# Patient Record
Sex: Male | Born: 1935 | Race: Black or African American | Hispanic: No | Smoking: Former smoker
Health system: Southern US, Community
[De-identification: ages and names within clinical notes are randomized; demographics above are authoritative.]

## PROBLEM LIST (undated history)

## (undated) DIAGNOSIS — Z8546 Personal history of malignant neoplasm of prostate: Secondary | ICD-10-CM

## (undated) DIAGNOSIS — N1330 Unspecified hydronephrosis: Secondary | ICD-10-CM

## (undated) DIAGNOSIS — A419 Sepsis, unspecified organism: Secondary | ICD-10-CM

## (undated) DIAGNOSIS — I10 Essential (primary) hypertension: Secondary | ICD-10-CM

## (undated) DIAGNOSIS — N210 Calculus in bladder: Secondary | ICD-10-CM

## (undated) DIAGNOSIS — R319 Hematuria, unspecified: Secondary | ICD-10-CM

## (undated) DIAGNOSIS — I82412 Acute embolism and thrombosis of left femoral vein: Secondary | ICD-10-CM

## (undated) DIAGNOSIS — Z9989 Dependence on other enabling machines and devices: Secondary | ICD-10-CM

## (undated) DIAGNOSIS — R0609 Other forms of dyspnea: Secondary | ICD-10-CM

## (undated) DIAGNOSIS — I4891 Unspecified atrial fibrillation: Secondary | ICD-10-CM

## (undated) DIAGNOSIS — Q181 Preauricular sinus and cyst: Secondary | ICD-10-CM

## (undated) DIAGNOSIS — J4489 Other specified chronic obstructive pulmonary disease: Secondary | ICD-10-CM

## (undated) DIAGNOSIS — K219 Gastro-esophageal reflux disease without esophagitis: Secondary | ICD-10-CM

## (undated) DIAGNOSIS — I82409 Acute embolism and thrombosis of unspecified deep veins of unspecified lower extremity: Secondary | ICD-10-CM

## (undated) DIAGNOSIS — Z973 Presence of spectacles and contact lenses: Secondary | ICD-10-CM

## (undated) DIAGNOSIS — R0600 Dyspnea, unspecified: Secondary | ICD-10-CM

## (undated) DIAGNOSIS — J449 Chronic obstructive pulmonary disease, unspecified: Secondary | ICD-10-CM

## (undated) DIAGNOSIS — M1990 Unspecified osteoarthritis, unspecified site: Secondary | ICD-10-CM

## (undated) DIAGNOSIS — G4733 Obstructive sleep apnea (adult) (pediatric): Secondary | ICD-10-CM

## (undated) HISTORY — PX: GANGLION CYST EXCISION: SHX1691

## (undated) HISTORY — DX: Acute embolism and thrombosis of left femoral vein: I82.412

## (undated) HISTORY — DX: Essential (primary) hypertension: I10

## (undated) HISTORY — DX: Other specified chronic obstructive pulmonary disease: J44.89

## (undated) HISTORY — DX: Chronic obstructive pulmonary disease, unspecified: J44.9

---
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
## 1997-10-25 ENCOUNTER — Ambulatory Visit (HOSPITAL_BASED_OUTPATIENT_CLINIC_OR_DEPARTMENT_OTHER): Admission: RE | Admit: 1997-10-25 | Discharge: 1997-10-25 | Payer: Self-pay | Admitting: Orthopedic Surgery

## 1997-11-08 ENCOUNTER — Encounter: Payer: Self-pay | Admitting: Cardiovascular Disease

## 1997-11-08 ENCOUNTER — Ambulatory Visit (HOSPITAL_COMMUNITY): Admission: RE | Admit: 1997-11-08 | Discharge: 1997-11-08 | Payer: Self-pay | Admitting: Cardiovascular Disease

## 1997-11-12 ENCOUNTER — Ambulatory Visit: Admission: RE | Admit: 1997-11-12 | Discharge: 1997-11-12 | Payer: Self-pay | Admitting: Internal Medicine

## 1997-11-12 ENCOUNTER — Encounter: Payer: Self-pay | Admitting: Internal Medicine

## 1998-10-06 ENCOUNTER — Encounter: Payer: Self-pay | Admitting: Internal Medicine

## 1998-10-06 ENCOUNTER — Ambulatory Visit (HOSPITAL_COMMUNITY): Admission: RE | Admit: 1998-10-06 | Discharge: 1998-10-06 | Payer: Self-pay | Admitting: Internal Medicine

## 1999-05-22 ENCOUNTER — Encounter: Admission: RE | Admit: 1999-05-22 | Discharge: 1999-05-22 | Payer: Self-pay | Admitting: Internal Medicine

## 1999-05-22 ENCOUNTER — Encounter: Payer: Self-pay | Admitting: Internal Medicine

## 2000-12-23 ENCOUNTER — Ambulatory Visit (HOSPITAL_COMMUNITY): Admission: RE | Admit: 2000-12-23 | Discharge: 2000-12-23 | Payer: Self-pay | Admitting: Internal Medicine

## 2000-12-23 ENCOUNTER — Encounter: Payer: Self-pay | Admitting: Internal Medicine

## 2001-04-15 ENCOUNTER — Ambulatory Visit (HOSPITAL_BASED_OUTPATIENT_CLINIC_OR_DEPARTMENT_OTHER): Admission: RE | Admit: 2001-04-15 | Discharge: 2001-04-15 | Payer: Self-pay | Admitting: Internal Medicine

## 2002-05-05 ENCOUNTER — Ambulatory Visit (HOSPITAL_COMMUNITY): Admission: RE | Admit: 2002-05-05 | Discharge: 2002-05-05 | Payer: Self-pay | Admitting: Gastroenterology

## 2003-06-14 ENCOUNTER — Encounter: Admission: RE | Admit: 2003-06-14 | Discharge: 2003-06-14 | Payer: Self-pay | Admitting: Internal Medicine

## 2005-03-12 ENCOUNTER — Ambulatory Visit: Payer: Self-pay | Admitting: Internal Medicine

## 2005-05-23 ENCOUNTER — Encounter: Admission: RE | Admit: 2005-05-23 | Discharge: 2005-05-23 | Payer: Self-pay | Admitting: Orthopedic Surgery

## 2005-05-28 ENCOUNTER — Ambulatory Visit (HOSPITAL_BASED_OUTPATIENT_CLINIC_OR_DEPARTMENT_OTHER): Admission: RE | Admit: 2005-05-28 | Discharge: 2005-05-28 | Payer: Self-pay | Admitting: Orthopedic Surgery

## 2005-05-28 HISTORY — PX: CARPAL TUNNEL RELEASE: SHX101

## 2006-01-13 ENCOUNTER — Emergency Department (HOSPITAL_COMMUNITY): Admission: EM | Admit: 2006-01-13 | Discharge: 2006-01-14 | Payer: Self-pay | Admitting: *Deleted

## 2006-03-11 ENCOUNTER — Ambulatory Visit: Payer: Self-pay | Admitting: Internal Medicine

## 2007-01-06 DIAGNOSIS — E669 Obesity, unspecified: Secondary | ICD-10-CM | POA: Insufficient documentation

## 2007-01-06 DIAGNOSIS — G4733 Obstructive sleep apnea (adult) (pediatric): Secondary | ICD-10-CM | POA: Insufficient documentation

## 2007-01-06 DIAGNOSIS — J449 Chronic obstructive pulmonary disease, unspecified: Secondary | ICD-10-CM

## 2007-01-06 DIAGNOSIS — E119 Type 2 diabetes mellitus without complications: Secondary | ICD-10-CM

## 2007-01-06 DIAGNOSIS — I10 Essential (primary) hypertension: Secondary | ICD-10-CM | POA: Insufficient documentation

## 2007-03-11 ENCOUNTER — Ambulatory Visit: Payer: Self-pay | Admitting: Internal Medicine

## 2008-03-10 ENCOUNTER — Ambulatory Visit: Payer: Self-pay | Admitting: Internal Medicine

## 2008-05-10 ENCOUNTER — Encounter: Payer: Self-pay | Admitting: Internal Medicine

## 2008-12-20 ENCOUNTER — Ambulatory Visit: Admission: RE | Admit: 2008-12-20 | Discharge: 2009-03-20 | Payer: Self-pay | Admitting: Radiation Oncology

## 2009-03-13 ENCOUNTER — Ambulatory Visit: Payer: Self-pay | Admitting: Internal Medicine

## 2009-03-20 ENCOUNTER — Ambulatory Visit: Admission: RE | Admit: 2009-03-20 | Discharge: 2009-03-24 | Payer: Self-pay | Admitting: Radiation Oncology

## 2009-03-27 ENCOUNTER — Ambulatory Visit: Admission: RE | Admit: 2009-03-27 | Discharge: 2009-05-09 | Payer: Self-pay | Admitting: Radiation Oncology

## 2010-03-13 ENCOUNTER — Ambulatory Visit: Payer: Self-pay | Admitting: Internal Medicine

## 2010-04-10 ENCOUNTER — Ambulatory Visit (HOSPITAL_COMMUNITY)
Admission: RE | Admit: 2010-04-10 | Discharge: 2010-04-10 | Payer: Self-pay | Source: Home / Self Care | Attending: Gastroenterology | Admitting: Gastroenterology

## 2010-04-11 LAB — GLUCOSE, CAPILLARY: Glucose-Capillary: 77 mg/dL (ref 70–99)

## 2010-04-26 NOTE — Assessment & Plan Note (Signed)
Summary: 1 YEAR/ MBW   Primary Roy [name]/Referring Roy [name]:  Kevan Ny  CC:  Yearly follow up visit-OSA and asthma.  History of Present Illness: 03/11/07- F/U Sleep Apnea; Satisfied with sleep now. Wears BIPap 7/4 [employer], nasal mask no humidifier. Feels rested in day without EDS, rare nap.  03/10/08- OSA Reviewed use of BiPap. Pressure unchanged. Occasional stuffy nose at night. Denies daytime sleepiness or reports of breakthrough snoring. His asthma is well controlled and he rarely needs rescue inhaler. Doesn't know his diabetes pills. Nocturia affected by timing of his diuretic- frequent.Denies heart trouble. Has had both flu shots.  March 13, 2009- OSA, Asthma/COPD He uses BiPAP 7/4 every night. He only snores if mask slips. Denies daytime somnolence. Uses Advair only in stretches if needed and hasn't needed his rescue inhaler in a long time. Had flu vax.  March 13, 2010- OSA, Asthma/COPD........Roy [name] here Has concerns that CPAP masks are not lasting. They work at first, then start to leak. Thedy would like to discuss changing DME companies  BIPAP I 7/ E 4. Uses all night every night.  No major chest problem or dyspnea this winter.      Preventive Screening-Counseling & Management  Alcohol-Tobacco     Smoking Status: quit     Packs/Day: 0.5     Year Quit: 2001  Current Medications (verified): 1)  Advair Diskus 250-50 Mcg/dose  Misc (Fluticasone-Salmeterol) .... One Inhhalation Twice Daily  ~ Rinse Mouth Well After Use 2)  Proair Hfa 108 (90 Base) Mcg/act Aers (Albuterol Sulfate) .... 2 Puffs Four Times A Day As Needed Rescue 3)  Bipap Inspiratory 7, Expiratory 4 .... [employer]  Allergies (verified): No Known Drug Allergies  Past History:  Past Medical History: Last updated: 03/10/2008 ASTHMA, CHRONIC OBSTRUCTIVE WITHOUT STATUS ASTHMATICUS (ICD-493.20) DM (ICD-250.00) HYPERTENSION (ICD-401.9) OBSTRUCTIVE SLEEP APNEA (ICD-327.23) EXOGENOUS OBESITY  (ICD-278.00)  Past Surgical History: Last updated: 03/10/2008 Inguinal hernia repair ganglion cyst  Family History: Last updated: 03/10/2008 father-diabetes  Social History: Last updated: 03/10/2008 Patient states former smoker.  Quit smoking 10 years ago.  Smoked x 25 years 1pp2days. Pt is married, with 5 children. Pt is retired, formerly a Production assistant, radio.  Risk Factors: Smoking Status: quit (03/13/2010) Packs/Day: 0.5 (03/13/2010)  Social History: Packs/Day:  0.5  Review of Systems      See HPI  The patient denies shortness of breath with activity, shortness of breath at rest, productive cough, non-productive cough, coughing up blood, chest pain, irregular heartbeats, acid heartburn, indigestion, loss of appetite, weight change, abdominal pain, difficulty swallowing, sore throat, tooth/dental problems, headaches, nasal congestion/difficulty breathing through nose, and sneezing.    Vital Signs:  Patient profile:   75 year old male Height:      70.5 inches Weight:      249.25 pounds BMI:     35.39 O2 Sat:      96 % on Room air Pulse rate:   68 / minute BP sitting:   126 / 82  (left arm) Cuff size:   large  Vitals Entered By: Clayborne Dana CMA (March 13, 2010 2:48 PM)  O2 Flow:  Room air CC: Yearly follow up visit-OSA and asthma   Physical Exam  Additional Exam:  General: A/Ox3; pleasant and cooperative, NAD, overweight NODES: no lymphadenopathy HEENT: [place]/AT, EOM- WNL, Conjuctivae- clear, PERRLA, TM-WNL, Nose- clear, Throat- clear and wnl, tonsils present, Mallampati IV overweight NECK: Supple w/ fair ROM, JVD- none, normal carotid impulses w/o bruits Thyroid-  CHEST: Clear to P&A HEART: RRR, no m/g/r  heard ABDOMEN: Soft and nl;  NEURO: Grossly intact to observation, very alert      Impression & Recommendations:  Problem # 1:  ASTHMA, CHRONIC OBSTRUCTIVE WITHOUT STATUS ASTHMATICUS (ICD-493.20) He is feeling pretty stable, w/o recent acute episodes and w/o  routine daily cough or wheeze.   Problem # 2:  OBSTRUCTIVE SLEEP APNEA (ICD-327.23)  He and his wife want to discuss DME company and alternative CPAP masks.   Medications Added to Medication List This Visit: 1)  Cpap Mask of Choice and Supplies   Other Orders: Est. Patient Level III [id number]) DME Referral (DME)  Patient Instructions: 1)  Please schedule a follow-up appointment in 1 year. 2)  See [facility] to discuss your CPAP company Prescriptions: CPAP MASK OF CHOICE AND SUPPLIES   #1 x prn   Entered and Authorized by:   Deneise Lever MD   Signed by:   Deneise Lever MD on 03/13/2010   Method used:   Print then Give to Patient   RxID:   [id number]

## 2010-05-31 NOTE — Op Note (Signed)
  Roy [name], Roy [name]             ACCOUNT NO.:  [id number]  MEDICAL RECORD NO.:  [id number]          PATIENT TYPE:  AMB  LOCATION:  ENDO                         FACILITY:  [facility]  PHYSICIAN:  Hayleigh Bawa C. Amedeo Plenty, M.D.    DATE OF BIRTH:  02-Jun-1935  DATE OF PROCEDURE:  04/10/2010 DATE OF DISCHARGE:                              OPERATIVE REPORT   PROCEDURE PERFORMED:  Colonoscopy with laser therapy.  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  DESCRIPTION OF PROCEDURE:  The patient was placed in the left lateral decubitus position and placed on the pulse monitor with continuous low- flow oxygen delivered by nasal cannula.  He was sedated with 75 mcg IV fentanyl and 5 mg IV Versed.  The Olympus video colonoscope was inserted into the rectum and advanced to the cecum, confirmed by transillumination of McBurney's point and visualization of the ileocecal valve and appendiceal orifice.  The prep was excellent.  The cecum, ascending, transverse, descending and sigmoid colon all appeared normal with no masses, polyps, diverticula or other mucosal abnormalities. Within the distal rectum approximately the last 5-8 cm, there were seen numerous flat telangiectatic lesions consistent with radiation proctitis with no contact hemorrhage.  Retroflexed view of the anus revealed these to be distributed in a roughly circumferential fashion.  The argon plasma coagulator was used to obliterate as many of the telangiectasia as could be accomplished with good results.  There was no significant bleeding resulting from the laser application.  No other lesions were seen in the rectum.  The scope was then withdrawn and the patient returned to the recovery room in stable condition.  He tolerated the procedure well.  There were no immediate complications.  IMPRESSION:  Successful laser fulguration of radiation proctitis.  PLAN:  We will observe response to today's  treatment and perform repeat laser therapy p.r.n. in the event of recurrent bleeding or significant recurrent bleeding.          ______________________________ Elyse Jarvis. Amedeo Plenty, M.D.     JCH/MEDQ  D:  04/10/2010  T:  04/10/2010  Job:  [id number]  cc:   Randall Hiss L. Marlou Sa, M.D. [address] [place] [id number]  Electronically Signed by Teena Irani M.D. on 05/29/2010 07:32:29 PM

## 2010-08-10 NOTE — Assessment & Plan Note (Signed)
[facility]                             PULMONARY OFFICE NOTE   NAME:Roy [name], Roy [name]                    MRN:          [id number]  DATE:03/11/2006                            DOB:          [birth year]    PROBLEMS:  1. Obstructive sleep apnea.  2. Exogenous obesity.  3. Hypertension.  4. Diabetes.  5. Asthma/chronic obstructive disease.   HISTORY:  One year followup. He has continued using a BiPAP machine with  an inspiratory pressure of 7, and expiratory pressure of 4, and says he  is very comfortable with this. His breathing over the past year has done  well. He does use Advair 250/50 twice daily, and with this rarely needs  albuterol. He has managed to lose some weight by dieting, and has had no  acute problems.   MEDICATIONS:  1. BiPAP inspiratory 7, expiratory 4, through [facility].  2. Advair 250/50 2 puffs b.i.d.  3. Rescue albuterol inhaler.  No medication allergy.   OBJECTIVE:  Weight 252 pounds, which is down 20 pounds from a year ago.  Blood pressure 160/88, pulse regular 72, room air saturation 98%.  Smiling, alert, appropriate. Chest is quiet and clear. Breathing is  unlabored. Voice quality is normal. There is no strider. Heart sounds  are regular without murmur.   IMPRESSION:  Stable obstructive sleep apnea, stable allergic rhinitis  with no acute respiratory issues. Asthma is well controlled.   PLAN:  Schedule return 1 year. He declines flu vaccine. Rescheduling  pulmonary function test.     Clinton D. Annamaria Boots, MD, [place], [place]  Electronically Signed    CDY/MedQ  DD: 03/12/2006  DT: 03/13/2006  Job #: [id number]   cc:   Randall Hiss L. Marlou Sa, M.D.

## 2010-08-10 NOTE — Op Note (Signed)
Roy [name], Roy [name]             ACCOUNT NO.:  [id number]   MEDICAL RECORD NO.:  [id number]          PATIENT TYPE:  AMB   LOCATION:  [place]                          FACILITY:  [facility]   PHYSICIAN:  Youlanda Mighty. Sypher, M.D. DATE OF BIRTH:  1935-07-15   DATE OF PROCEDURE:  05/28/2005  DATE OF DISCHARGE:                                 OPERATIVE REPORT   PREOPERATIVE DIAGNOSIS:  Left carpal tunnel syndrome.   POSTOPERATIVE DIAGNOSIS:  Left carpal tunnel syndrome.   OPERATION:  Release of left transverse carpal ligament.   OPERATIONS:  Youlanda Mighty. Sypher, M.D.   ASSISTANT:  Leverne Humbles, PA-C.   ANESTHESIA:  Proximal forearm IV regional block.   SUPERVISING ANESTHESIOLOGIST:  Sherren Kerns, M.D.   INDICATIONS:  Roy [name] is a 75 year old gentleman with a history of  prior bilateral carpal tunnel syndrome.  He is status post release of his  right transverse carpal ligament 6 years prior with good results.  He now  returns for followup evaluation of his left hand.  Clinical examination  revealed signs of significant entrapment neuropathy.  Electrodiagnostic  studies completed by Dr. Zebedee Iba confirmed left carpal tunnel syndrome.   Due to a failure to respond to nonoperative measures, she is brought to the  operating room at this time for release of his left transverse carpal  ligament.   Preoperatively, he was seen in consultation by Dr. Tamala Julian, who provided  anesthesia consultation.  He was noted to have an elevated cutaneous blood  glucose and background medical problems of hypertension, reflux and  obstructive airways disease.   Preoperatively, questions were invited and answered.   PROCEDURE:  Roy [name] is brought to the operating room and placed in  supine position upon the operating table.   Following the placement of a proximal forearm level IV regional block under  the direct supervision of Dr. Tamala Julian, the left arm was prepped with Betadine  soap and solution and  sterilely draped.  After 9 minutes, anesthesia was  satisfactory in the left arm.   The procedure commenced with a short incision in the line of the ring finger  and the palm.  Subcutaneous tissues were carefully divided, revealing the  palmar fascia.  This was split longitudinally to reveal the common extensor  branch of the median nerve.  These were followed back to the transverse  carpal ligament, which was carefully isolated from the median nerve.  The  ligament was released with scissors along the ulnar border of the transverse  carpal ligament, extending into the distal forearm.  This widely opened the  carpal canal.  No mass or other predicaments were noted.  Bleeding points  along the margin of the released ligament were cauterized with bipolar  current followed by repair the skin with intradermal 3-0 Prolene suture.   A compressive dressings was applied with a volar plaster splint maintaining  the wrist in 5 degrees of dorsiflexion.      Youlanda Mighty Sypher, M.D.  Electronically Signed     RVS/MEDQ  D:  05/28/2005  T:  05/29/2005  Job:  [id number]   cc:  Eric L. Marlou Sa, M.D.  Fax: [phone]

## 2010-08-10 NOTE — Op Note (Signed)
   NAME:  Roy [name], Roy [name]                       ACCOUNT NO.:  [id number]   MEDICAL RECORD NO.:  [id number]                   PATIENT TYPE:  AMB   LOCATION:  ENDO                                 FACILITY:  [facility]   PHYSICIAN:  John C. Amedeo Plenty, M.D.                 DATE OF BIRTH:  Oct 26, 1935   DATE OF PROCEDURE:  DATE OF DISCHARGE:                                 OPERATIVE REPORT   INDICATIONS FOR PROCEDURE:  Rectal bleeding in this 75 year old patient with  no prior colon screening.   DESCRIPTION OF PROCEDURE:  The patient was placed in the left lateral  decubitus position and placed on the pulse monitor with continuous low flow  oxygen delivered by nasal cannula.  He was sedated with 100 mcg of IV  Fentanyl ad 10 mg of IV Versed.  Video colonoscope was inserted into the  rectum and advanced to the cecum, confirmed by transillumination at  McBurney's point and visualization of the ileocecal valve and appendiceal  orifice.  The prep was good.  The cecum, ascending and transverse colon  appeared normal with no masses, polyps, diverticula or other mucosal  abnormalities.  Within the descending and sigmoid, there were a few  scattered diverticula with no  other abnormalities.  The rectum appeared  normal and the retroflex view of the anus revealed minimal internal  hemorrhoids.  The colonoscope was then withdrawn and the patient returned to  the recovery room in stable condition.  He tolerated the procedure well and  there were no immediate complications.   IMPRESSION:  Few scattered diverticula and small internal hemorrhoids,  otherwise normal study.   PLAN:  1. Treat hemorrhoids symptomatically.  2. Next colon screening by sigmoidoscopy in five years.                                               John C. Amedeo Plenty, M.D.    JCH/MEDQ  D:  05/05/2002  T:  05/05/2002  Job:  [id number]

## 2010-10-09 ENCOUNTER — Encounter: Payer: Self-pay | Admitting: Internal Medicine

## 2010-10-09 ENCOUNTER — Ambulatory Visit (INDEPENDENT_AMBULATORY_CARE_PROVIDER_SITE_OTHER): Payer: Medicare Other | Admitting: Internal Medicine

## 2010-10-09 VITALS — BP 150/78 | HR 60 | Ht 70.5 in | Wt 246.6 lb

## 2010-10-09 DIAGNOSIS — G4733 Obstructive sleep apnea (adult) (pediatric): Secondary | ICD-10-CM

## 2010-10-09 DIAGNOSIS — J449 Chronic obstructive pulmonary disease, unspecified: Secondary | ICD-10-CM

## 2010-10-09 NOTE — Assessment & Plan Note (Signed)
Feels his breathing is ok, with no asthma. Aware of stable dyspnea if he hurries. He hasn't needed Advair or rescue inhaler in a long time.

## 2010-10-09 NOTE — Assessment & Plan Note (Signed)
He is switching to [facility] and will continue BiPAP through them. He needs script now for CPAP supplies, and for replacement machine when due in September.

## 2010-10-09 NOTE — Progress Notes (Signed)
  Subjective:    Patient ID: Roy [name], male    DOB: [birth year], 75 y.o.   MRN: [id number]  HPI 10/09/10- 30 yoM followed for OSA, Asthma/ COPD    Wife here Last here March 13, 2010- note reviewed. He and his wife were unhappy with [facility] [employer] and are switching to [facility]. He has done well with BIPAP 17/4, with good control and compliance. He has not been able to lose weight.   Review of Systems Constitutional:   No-   weight loss, night sweats, fevers, chills, fatigue, lassitude. HEENT:   No-   headaches, difficulty swallowing, tooth/dental problems, sore throat,                  No-   sneezing, itching, ear ache, nasal congestion, post nasal drip,   CV:  No-   chest pain, orthopnea, PND, swelling in lower extremities, anasarca, dizziness, palpitations  GI:  No-   heartburn, indigestion, abdominal pain, nausea, vomiting, diarrhea,                 change in bowel habits, loss of appetite  Resp: No-   shortness of breath with exertion or at rest.  No-  excess mucus,             No-   productive cough,  No non-productive cough,  No-  coughing up of blood.              No-   change in color of mucus.  No- wheezing.    Skin: No-   rash or lesions.  GU: No-   dysuria, change in color of urine, no urgency or frequency.  No- flank pain.  MS:  No-   joint pain or swelling.  No- decreased range of motion.  No- back pain.  Psych:  No- change in mood or affect. No depression or anxiety.  No memory loss.      Objective:   Physical Exam General- Alert, Oriented, Affect-appropriate, Distress- none acute  obese Skin- rash-none, lesions- none, excoriation- none Lymphadenopathy- none Head- atraumatic            Eyes- Gross vision intact, PERRLA, conjunctivae clear secretions            Ears- Hearing, canals            Nose- Clear, No- Septal dev, mucus, polyps, erosion, perforation             Throat- Mallampati III , mucosa clear , drainage- none, tonsils- present.  Dental repair Neck-  flexible , trachea midline, no stridor , thyroid nl, carotid no bruit Chest - symmetrical excursion , unlabored           Heart/CV- RRR , no murmur , no gallop  , no rub, nl s1 s2                           - JVD- none , edema- none, stasis changes- none, varices- none           Lung- clear to P&A, wheeze- none, cough- none , dullness-none, rub- none           Chest wall-  Abd- tender-no, distended-no, bowel sounds-present, HSM- no Br/ Gen/ Rectal- Not done, not indicated Extrem- cyanosis- none, clubbing, none, atrophy- none, strength- nl Neuro- grossly intact to observation         Assessment & Plan:

## 2010-10-09 NOTE — Patient Instructions (Signed)
Scripts printed-    BiPAP machine replacement  17/4 , heated humidifier,  Supplies                    Dx OSA  Script-     CPAP mask of choice and supplies as needed

## 2010-10-10 ENCOUNTER — Encounter: Payer: Self-pay | Admitting: Internal Medicine

## 2010-10-19 ENCOUNTER — Other Ambulatory Visit: Payer: Self-pay | Admitting: Internal Medicine

## 2010-10-19 ENCOUNTER — Ambulatory Visit
Admission: RE | Admit: 2010-10-19 | Discharge: 2010-10-19 | Disposition: A | Discharge status: Home / Self Care | Payer: Self-pay | Source: Ambulatory Visit | Attending: Internal Medicine | Admitting: Internal Medicine

## 2010-10-19 ENCOUNTER — Ambulatory Visit
Admission: RE | Admit: 2010-10-19 | Discharge: 2010-10-19 | Disposition: A | Discharge status: Home / Self Care | Payer: Medicare Other | Source: Ambulatory Visit | Attending: Internal Medicine | Admitting: Internal Medicine

## 2010-10-19 DIAGNOSIS — M545 Low back pain, unspecified: Secondary | ICD-10-CM

## 2010-10-19 DIAGNOSIS — M79606 Pain in leg, unspecified: Secondary | ICD-10-CM

## 2011-04-17 ENCOUNTER — Ambulatory Visit
Admission: RE | Admit: 2011-04-17 | Discharge: 2011-04-17 | Disposition: A | Discharge status: Home / Self Care | Payer: Medicare Other | Source: Ambulatory Visit | Attending: Internal Medicine | Admitting: Internal Medicine

## 2011-04-17 ENCOUNTER — Other Ambulatory Visit: Payer: Self-pay | Admitting: Internal Medicine

## 2011-04-17 DIAGNOSIS — M545 Low back pain, unspecified: Secondary | ICD-10-CM

## 2011-04-17 DIAGNOSIS — M79673 Pain in unspecified foot: Secondary | ICD-10-CM

## 2011-07-24 ENCOUNTER — Other Ambulatory Visit: Payer: Self-pay | Admitting: Internal Medicine

## 2011-10-08 ENCOUNTER — Ambulatory Visit: Payer: Medicare Other | Admitting: Internal Medicine

## 2011-10-14 ENCOUNTER — Ambulatory Visit (INDEPENDENT_AMBULATORY_CARE_PROVIDER_SITE_OTHER): Payer: Medicare Other | Admitting: Internal Medicine

## 2011-10-14 ENCOUNTER — Encounter: Payer: Self-pay | Admitting: Internal Medicine

## 2011-10-14 VITALS — BP 136/70 | HR 61 | Ht 70.5 in | Wt 237.4 lb

## 2011-10-14 DIAGNOSIS — J449 Chronic obstructive pulmonary disease, unspecified: Secondary | ICD-10-CM

## 2011-10-14 DIAGNOSIS — G4733 Obstructive sleep apnea (adult) (pediatric): Secondary | ICD-10-CM

## 2011-10-14 NOTE — Progress Notes (Signed)
  Subjective:    Patient ID: Roy [name], male    DOB: [birth year], 76 y.o.   MRN: [id number]  HPI 10/09/10- 27 yoM followed for OSA, Asthma/ COPD    Wife here Last here March 13, 2010- note reviewed. He and his wife were unhappy with [facility] [facility] and are switching to [facility]. He has done well with BIPAP 17/4, with good control and compliance. He has not been able to lose weight.   10/14/11- 60 yoM followed for OSA, Asthma/ COPD    Wife here Pt states he is wearing BiPAP 7/4/ [facility] nightly approx 8-9 hrs. Denies problems with pressure or mask. Pt states that [facility] has been "wonderful" since he switched companies! Adding a humidifier also made a big difference. Asthma symptoms have been well-controlled. No wheeze this summer and no need for his asthma medications.  ROS-see HPI Constitutional:   No-   weight loss, night sweats, fevers, chills, fatigue, lassitude. HEENT:   No-  headaches, difficulty swallowing, tooth/dental problems, sore throat,       No-  sneezing, itching, ear ache, nasal congestion, post nasal drip,  CV:  No-   chest pain, orthopnea, PND, swelling in lower extremities, anasarca,  dizziness, palpitations Resp: No-   shortness of breath with exertion or at rest.              No-   productive cough,  No non-productive cough,  No- coughing up of blood.              No-   change in color of mucus.  No- wheezing.   Skin: No-   rash or lesions. GI:  No-   heartburn, indigestion, abdominal pain, nausea, vomiting,  GU:  MS:  No-   joint pain or swelling.   Neuro-     nothing unusual Psych:  No- change in mood or affect. No depression or anxiety.  No memory loss.  Objective:   Physical Exam General- Alert, Oriented, Affect-appropriate, Distress- none acute,  obese Skin- rash-none, lesions- none, excoriation- none Lymphadenopathy- none Head- atraumatic            Eyes- Gross vision intact, PERRLA, conjunctivae clear secretions            Ears- Hearing, canals            Nose-  Clear, No- Septal dev, mucus, polyps, erosion, perforation             Throat- Mallampati III , mucosa clear , drainage- none, tonsils- present.  Dental repair Neck- flexible , trachea midline, no stridor , thyroid nl, carotid no bruit Chest - symmetrical excursion , unlabored           Heart/CV- RRR , no murmur , no gallop  , no rub, nl s1 s2                           - JVD- none , edema- none, stasis changes- none, varices- none           Lung- clear to P&A, wheeze- none, cough- none , dullness-none, rub- none           Chest wall-  Abd-  Br/ Gen/ Rectal- Not done, not indicated Extrem- cyanosis- none, clubbing, none, atrophy- none, strength- nl Neuro- grossly intact to observation Assessment & Plan:

## 2011-10-14 NOTE — Patient Instructions (Addendum)
Continue BiPAP with [facility]. Please call as needed

## 2011-10-16 ENCOUNTER — Other Ambulatory Visit: Payer: Self-pay | Admitting: Urology

## 2011-10-17 ENCOUNTER — Encounter (HOSPITAL_BASED_OUTPATIENT_CLINIC_OR_DEPARTMENT_OTHER): Payer: Self-pay | Admitting: *Deleted

## 2011-10-17 NOTE — Progress Notes (Signed)
NPO AFTER MN. ARRIVES AT 0615. NEEDS ISTAT AND EKG. WILL TAKE DILTIAZEM AND PROTONIX AM OF SURG W/ SIP OF WATER.

## 2011-10-17 NOTE — Assessment & Plan Note (Signed)
Good symptom control, in remission without need for active medication yet this summer.

## 2011-10-17 NOTE — H&P (Signed)
History of Present Illness  Mr Roy [name] returns for follow-up evaluation of microhematuria.  CT scan revealed severe left hydronephrosis without definite cause of obstruction and several bladder calculi.  He has hesitancy, decreased stream and urgency.  He is S/P IMRT from 03/06/09 through 05/05/09 for adenocarcinoma of prostate Gleason 6 and PSA of 9.61.  He needs cystoscopy.   Past Medical History Problems  1. History of  Adult Sleep Apnea 780.57 2. History of  Diabetes Mellitus 250.00 3. History of  Hypertension 401.9  Surgical History Problems  1. History of  Inguinal Hernia Repair Right  Current Meds 1. Albuterol 90 MCG/ACT AERS; Therapy: (Recorded:11Aug2008) to 2. Aspirin 81 MG Oral Tablet; Therapy: (Recorded:11Aug2008) to 3. Cartia XT 300 MG Oral Capsule Extended Release 24 Hour; Therapy: (Recorded:11Aug2008) to 4. Glimepiride 4 MG Oral Tablet; Therapy: (Recorded:11Aug2008) to 5. Januvia 100 MG Oral Tablet; Therapy: (Recorded:11Aug2008) to 6. Klor-Con M20 TBCR; Therapy: (Recorded:11Aug2008) to 7. Lisinopril 40 MG Oral Tablet; Therapy: (Recorded:11Aug2008) to 8. SEROquel 25 MG Oral Tablet; Therapy: [id number] to 9. Torsemide 100 MG Oral Tablet; Therapy: (Recorded:11Aug2008) to  Allergies Medication  1. No Known Drug Allergies  Family History Problems  1. Paternal history of  Death In The Family Father 2. Paternal history of  Death In The Family Mother 3. Family history of  Diabetes Mellitus V18.0 4. Paternal history of  Family Health Status Number Of Children 4 sons/ 1 daug  Social History Problems  1. Marital History - Currently Married 2. Tobacco Use V15.82 Denied  3. History of  Alcohol Use 4. History of  Caffeine Use  Review of Systems Genitourinary, constitutional, skin, eye, otolaryngeal, hematologic/lymphatic, cardiovascular, pulmonary, endocrine, musculoskeletal, gastrointestinal, neurological and psychiatric system(s) were reviewed and pertinent findings if  present are noted.  Genitourinary: urinary frequency, feelings of urinary urgency, nocturia and weak urinary stream.    Physical Exam Constitutional: Well nourished and well developed . No acute distress.  ENT:. The ears and nose are normal in appearance.  Neck: The appearance of the neck is normal and no neck mass is present.  Pulmonary: No respiratory distress and normal respiratory rhythm and effort.  Cardiovascular: Heart rate and rhythm are normal . No peripheral edema.  Abdomen: The abdomen is soft and nontender. No masses are palpated. No CVA tenderness. No hernias are palpable. No hepatosplenomegaly noted.  Rectal: Rectal exam demonstrates no tenderness and no masses. The left seminal vesicle is nonpalpable. The right seminal vesicle is nonpalpable. The perineum is normal on inspection.  Genitourinary: Examination of the penis demonstrates no discharge, no masses, no lesions and a normal meatus. The scrotum is without lesions. The right epididymis is palpably normal and non-tender. The left epididymis is palpably normal and non-tender. The right testis is non-tender and without masses. The left testis is non-tender and without masses.  Lymphatics: The femoral and inguinal nodes are not enlarged or tender.  Skin: Normal skin turgor, no visible rash and no visible skin lesions.  Neuro/Psych:. Mood and affect are appropriate.    Results/Data Urine [Data Includes: Last 1 Day]   [id number]  COLOR YELLOW   APPEARANCE CLEAR   SPECIFIC GRAVITY 1.010   pH 5.5   GLUCOSE NEG mg/dL  BILIRUBIN NEG   KETONE NEG mg/dL  BLOOD MOD   PROTEIN NEG mg/dL  UROBILINOGEN 0.2 mg/dL  NITRITE NEG   LEUKOCYTE ESTERASE NEG   SQUAMOUS EPITHELIAL/HPF NONE SEEN   WBC NONE SEEN WBC/hpf  RBC 7-10 RBC/hpf  BACTERIA NONE SEEN   CRYSTALS NONE  SEEN   CASTS NONE SEEN     I independently reviewed the CT scan with the patient and the findings are as noted above.   Procedure  Procedure: Cystoscopy    Indication: Hematuria.  Informed Consent: Risks, benefits, and potential adverse events were discussed and informed consent was obtained from the patient . Specific risks including, but not limited to bleeding, infection, pain, allergic reaction etc. were explained.  Prep: The patient was prepped with betadine.  Anesthesia:. Local anesthesia was administered intraurethrally with 2% lidocaine jelly.  Antibiotic prophylaxis: Ciprofloxacin.  Procedure Note:  Urethral meatus:. No abnormalities.  Anterior urethra: No abnormalities.  Prostatic urethra:. The lateral and median prostatic lobes were enlarged.  Bladder: Visulization was clear. Multiple stones were identified in the bladder.  There is no bladder tumor. The patient tolerated the procedure well.    Assessment Assessed  1. Bladder Calculus 594.1 2. Hydronephrosis 591 3. Adenocarcinoma Of The Prostate Gland 185   Left hydronephrosis.   Plan Adenocarcinoma Of The Prostate Gland (185)  1. Tamsulosin HCl 0.4 MG Oral Capsule; TAKE 1 CAPSULE Bedtime; Therapy: [phone] to (Last  Rx:24Jul2013) Health Maintenance (V70.0)  2. UA With REFLEX  Done: [id number] 09:00AM   Cystoscopy, bladder stones extraction with holmium laser, left retrograde pyelogram and ureteroscopy.  The procedures, risks, benefits were explained to the patient.  The risks include but are not limited to hemorrhage, infection, bladder injury, ureteral injury.  He understands and is agreeable.   Signatures Electronically signed by : Lowella Bandy, M.D.; Oct 16 2011 10:28AM

## 2011-10-17 NOTE — Assessment & Plan Note (Signed)
Good compliance and control. He is pleased. We emphasized weight loss and safe driving.

## 2011-10-18 ENCOUNTER — Encounter (HOSPITAL_BASED_OUTPATIENT_CLINIC_OR_DEPARTMENT_OTHER): Payer: Self-pay | Admitting: Anesthesiology

## 2011-10-18 ENCOUNTER — Encounter (HOSPITAL_BASED_OUTPATIENT_CLINIC_OR_DEPARTMENT_OTHER): Payer: Self-pay | Admitting: *Deleted

## 2011-10-18 ENCOUNTER — Encounter (HOSPITAL_BASED_OUTPATIENT_CLINIC_OR_DEPARTMENT_OTHER)
Admission: RE | Disposition: A | Discharge status: Home / Self Care | Payer: Self-pay | Source: Ambulatory Visit | Attending: Urology

## 2011-10-18 ENCOUNTER — Ambulatory Visit (HOSPITAL_BASED_OUTPATIENT_CLINIC_OR_DEPARTMENT_OTHER): Payer: Medicare Other | Admitting: Anesthesiology

## 2011-10-18 ENCOUNTER — Other Ambulatory Visit: Payer: Self-pay

## 2011-10-18 ENCOUNTER — Ambulatory Visit (HOSPITAL_BASED_OUTPATIENT_CLINIC_OR_DEPARTMENT_OTHER)
Admission: RE | Admit: 2011-10-18 | Discharge: 2011-10-18 | Disposition: A | Discharge status: Home / Self Care | Payer: Medicare Other | Source: Ambulatory Visit | Attending: Urology | Admitting: Urology

## 2011-10-18 DIAGNOSIS — N1330 Unspecified hydronephrosis: Secondary | ICD-10-CM | POA: Insufficient documentation

## 2011-10-18 DIAGNOSIS — C61 Malignant neoplasm of prostate: Secondary | ICD-10-CM | POA: Insufficient documentation

## 2011-10-18 DIAGNOSIS — I10 Essential (primary) hypertension: Secondary | ICD-10-CM | POA: Insufficient documentation

## 2011-10-18 DIAGNOSIS — N210 Calculus in bladder: Secondary | ICD-10-CM | POA: Insufficient documentation

## 2011-10-18 DIAGNOSIS — G4730 Sleep apnea, unspecified: Secondary | ICD-10-CM | POA: Insufficient documentation

## 2011-10-18 DIAGNOSIS — Z79899 Other long term (current) drug therapy: Secondary | ICD-10-CM | POA: Insufficient documentation

## 2011-10-18 DIAGNOSIS — E119 Type 2 diabetes mellitus without complications: Secondary | ICD-10-CM | POA: Insufficient documentation

## 2011-10-18 DIAGNOSIS — Z7982 Long term (current) use of aspirin: Secondary | ICD-10-CM | POA: Insufficient documentation

## 2011-10-18 HISTORY — DX: Hematuria, unspecified: R31.9

## 2011-10-18 HISTORY — DX: Obstructive sleep apnea (adult) (pediatric): G47.33

## 2011-10-18 HISTORY — DX: Dyspnea, unspecified: R06.00

## 2011-10-18 HISTORY — DX: Unspecified osteoarthritis, unspecified site: M19.90

## 2011-10-18 HISTORY — DX: Gastro-esophageal reflux disease without esophagitis: K21.9

## 2011-10-18 HISTORY — DX: Personal history of malignant neoplasm of prostate: Z85.46

## 2011-10-18 HISTORY — PX: CYSTOSCOPY WITH LITHOLAPAXY: SHX1425

## 2011-10-18 HISTORY — DX: Dependence on other enabling machines and devices: Z99.89

## 2011-10-18 HISTORY — DX: Calculus in bladder: N21.0

## 2011-10-18 HISTORY — DX: Other forms of dyspnea: R06.09

## 2011-10-18 HISTORY — DX: Unspecified hydronephrosis: N13.30

## 2011-10-18 LAB — POCT I-STAT 4, (NA,K, GLUC, HGB,HCT)
Glucose, Bld: 129 mg/dL — ABNORMAL HIGH (ref 70–99)
HCT: 39 % (ref 39.0–52.0)
Hemoglobin: 13.3 g/dL (ref 13.0–17.0)
Potassium: 3.9 mEq/L (ref 3.5–5.1)

## 2011-10-18 LAB — GLUCOSE, CAPILLARY: Glucose-Capillary: 121 mg/dL — ABNORMAL HIGH (ref 70–99)

## 2011-10-18 SURGERY — CYSTOSCOPY, WITH BLADDER CALCULUS LITHOLAPAXY
Anesthesia: General | Site: Bladder | Wound class: Clean Contaminated

## 2011-10-18 MED ORDER — LIDOCAINE HCL 2 % EX GEL
CUTANEOUS | Status: DC | PRN
  Administered 2011-10-18: 1 via URETHRAL

## 2011-10-18 MED ORDER — FENTANYL CITRATE 0.05 MG/ML IJ SOLN
INTRAMUSCULAR | Status: DC | PRN
  Administered 2011-10-18 (×6): 25 ug via INTRAVENOUS

## 2011-10-18 MED ORDER — ONDANSETRON HCL 4 MG/2ML IJ SOLN
INTRAMUSCULAR | Status: DC | PRN
  Administered 2011-10-18: 4 mg via INTRAVENOUS

## 2011-10-18 MED ORDER — LIDOCAINE HCL (CARDIAC) 20 MG/ML IV SOLN
INTRAVENOUS | Status: DC | PRN
  Administered 2011-10-18: 75 mg via INTRAVENOUS

## 2011-10-18 MED ORDER — INDIGOTINDISULFONATE SODIUM 8 MG/ML IJ SOLN
INTRAMUSCULAR | Status: DC | PRN
  Administered 2011-10-18: 5 mL via INTRAVENOUS

## 2011-10-18 MED ORDER — IOHEXOL 350 MG/ML SOLN
INTRAVENOUS | Status: DC | PRN
  Administered 2011-10-18: 50 mL

## 2011-10-18 MED ORDER — PROPOFOL 10 MG/ML IV EMUL
INTRAVENOUS | Status: DC | PRN
  Administered 2011-10-18: 220 mg via INTRAVENOUS

## 2011-10-18 MED ORDER — CIPROFLOXACIN HCL 250 MG PO TABS: 250.0000 mg | ORAL_TABLET | Freq: Two times a day (BID) | ORAL | Status: AC

## 2011-10-18 MED ORDER — FENTANYL CITRATE 0.05 MG/ML IJ SOLN: 25.0000 ug | INTRAMUSCULAR | Status: DC | PRN

## 2011-10-18 MED ORDER — CEFAZOLIN SODIUM 1-5 GM-% IV SOLN: 1.0000 g | INTRAVENOUS | Status: DC

## 2011-10-18 MED ORDER — SODIUM CHLORIDE 0.9 % IR SOLN
Status: DC | PRN
  Administered 2011-10-18: 3000 mL

## 2011-10-18 MED ORDER — CEFAZOLIN SODIUM-DEXTROSE 2-3 GM-% IV SOLR
2.0000 g | INTRAVENOUS | Status: AC
  Administered 2011-10-18: 2 g via INTRAVENOUS

## 2011-10-18 MED ORDER — LACTATED RINGERS IV SOLN
INTRAVENOUS | Status: DC
  Administered 2011-10-18 (×3): via INTRAVENOUS

## 2011-10-18 SURGICAL SUPPLY — 40 items
ADAPTER CATH URET PLST 4-6FR (CATHETERS) IMPLANT
ADPR CATH URET STRL DISP 4-6FR (CATHETERS)
BAG DRAIN URO-CYSTO SKYTR STRL (DRAIN) ×3 IMPLANT
BAG DRN UROCATH (DRAIN) ×2
BAG URINE LEG 500ML (DRAIN) ×3 IMPLANT
BASKET LASER NITINOL 1.9FR (BASKET) IMPLANT
BASKET STNLS GEMINI 4WIRE 3FR (BASKET) IMPLANT
BASKET ZERO TIP NITINOL 2.4FR (BASKET) ×3 IMPLANT
BRUSH URET BIOPSY 3F (UROLOGICAL SUPPLIES) IMPLANT
BSKT STON RTRVL 120 1.9FR (BASKET)
BSKT STON RTRVL GEM 120X11 3FR (BASKET)
BSKT STON RTRVL ZERO TP 2.4FR (BASKET) ×2
CANISTER SUCT LVC 12 LTR MEDI- (MISCELLANEOUS) ×3 IMPLANT
CATH FOLEY 2WAY SLVR  5CC 18FR (CATHETERS) ×1
CATH FOLEY 2WAY SLVR 5CC 18FR (CATHETERS) ×2 IMPLANT
CATH INTERMIT  6FR 70CM (CATHETERS) IMPLANT
CATH URET 5FR 28IN CONE TIP (BALLOONS) ×1
CATH URET 5FR 28IN OPEN ENDED (CATHETERS) IMPLANT
CATH URET 5FR 70CM CONE TIP (BALLOONS) ×2 IMPLANT
CLOTH BEACON ORANGE TIMEOUT ST (SAFETY) ×3 IMPLANT
DRAPE CAMERA CLOSED 9X96 (DRAPES) ×3 IMPLANT
GLOVE BIO SURGEON STRL SZ7 (GLOVE) ×3 IMPLANT
GLOVE INDICATOR 6.5 STRL GRN (GLOVE) ×6 IMPLANT
GUIDEWIRE 0.038 PTFE COATED (WIRE) ×3 IMPLANT
GUIDEWIRE ANG ZIPWIRE 038X150 (WIRE) IMPLANT
GUIDEWIRE STR DUAL SENSOR (WIRE) ×3 IMPLANT
KIT BALLIN UROMAX 15FX10 (LABEL) IMPLANT
KIT BALLN UROMAX 15FX4 (MISCELLANEOUS) IMPLANT
KIT BALLN UROMAX 26 75X4 (MISCELLANEOUS)
LASER FIBER DISP (UROLOGICAL SUPPLIES) IMPLANT
LASER FIBER DISP 1000U (UROLOGICAL SUPPLIES) IMPLANT
NS IRRIG 500ML POUR BTL (IV SOLUTION) ×3 IMPLANT
PACK CYSTOSCOPY (CUSTOM PROCEDURE TRAY) ×3 IMPLANT
SET HIGH PRES BAL DIL (LABEL)
SHEATH URET ACCESS 12FR/35CM (UROLOGICAL SUPPLIES) IMPLANT
SHEATH URET ACCESS 12FR/55CM (UROLOGICAL SUPPLIES) IMPLANT
SYRINGE 10CC LL (SYRINGE) ×3 IMPLANT
SYRINGE IRR TOOMEY STRL 70CC (SYRINGE) ×3 IMPLANT
UNDERPAD 30X30 INCONTINENT (UNDERPADS AND DIAPERS) ×3 IMPLANT
WATER STERILE IRR 1000ML POUR (IV SOLUTION) ×3 IMPLANT

## 2011-10-18 NOTE — Transfer of Care (Signed)
Immediate Anesthesia Transfer of Care Note  Patient: Roy [name]  Procedure(s) Performed: Procedure(s) (LRB): CYSTOSCOPY WITH LITHOLAPAXY (N/A)  Patient Location: Patient transported to PACU with oxygen via face mask at 6 Liters / Min  Anesthesia Type: General  Level of Consciousness: awake and alert   Airway & Oxygen Therapy: Patient Spontanous Breathing and Patient connected to face mask oxygen  Post-op Assessment: Report given to PACU RN and Post -op Vital signs reviewed and stable  Post vital signs: Reviewed and stable  Dentition: Teeth and oropharynx remain in pre-op condition  Complications: No apparent anesthesia complications

## 2011-10-18 NOTE — OR Nursing (Signed)
Multiple bladder stones taking by Dr. Janice Norrie.

## 2011-10-18 NOTE — Op Note (Signed)
Roy [name] is a 76 y.o.   10/18/2011  General  Pre-op diagnosis: Bladder calculi,  adenocarcinoma of prostate, left hydronephrosis   Postop diagnosis: Same  Procedure done: Cystoscopy, extraction of bladder stones, attempted left retrograde pyelogram  Surgeon: Charlene Brooke. Livio Ledwith  Anesthesia: General  Indication: Patient is a 76 years old male who had IMRT 3 years ago for adenocarcinoma of prostate. He was found on urinalysis to have microhematuria. CT scan showed  severe left hydronephrosis without definite cause  of the hydronephrosis and multiple bladder calculi. Cystoscopy revealed several small stones in the bladder. He is scheduled today for cystoscopy, bladder stones extraction left retrograde pyelogram and ureteroscopy.  Procedure: The patient was identified by his wrist band and proper timeout was taken.  Under general anesthesia patient was prepped and draped and placed in the dorsolithotomy position. A panendoscope was then inserted in the bladder. The anterior urethra is normal; there is trilobar prostatic hypertrophy. There are multiple stones in the bladder measuring 2-8 cm in size. It was difficult to visualize the ureteral orifices.  With the Nitinol basket or bladder stones  were removed. There was no remaining stone in the bladder.  Attempted on several occasions to identify the left ureteral orifice but could not find it. 1 ampule of indigo carmine was then injected IV and after 10 minutes there was no evidence of blue dye out of the ureters.  Therefore the retrograde pyelogram could not be done.  The bladder was then emptied and the cystoscope removed. A #18 French Foley catheter was then inserted in the bladder.  The patient tolerated the procedure well and left the OR in satisfactory condition to postanesthesia care unit.  Cc: Dr. Kevan Ny

## 2011-10-18 NOTE — Anesthesia Procedure Notes (Signed)
Procedure Name: LMA Insertion Date/Time: 10/18/2011 7:40 AM Performed by: Christiana Fuchs Pre-anesthesia Checklist: Patient identified, Emergency Drugs available, Suction available and Patient being monitored Patient Re-evaluated:Patient Re-evaluated prior to inductionOxygen Delivery Method: Circle System Utilized Preoxygenation: Pre-oxygenation with 100% oxygen Intubation Type: IV induction Ventilation: Mask ventilation without difficulty LMA: LMA inserted LMA Size: 4.0 Number of attempts: 1 Airway Equipment and Method: bite block Placement Confirmation: positive ETCO2 Tube secured with: Tape Dental Injury: Teeth and Oropharynx as per pre-operative assessment  Comments: LMA inserted by Dr. Thereasa Parkin.

## 2011-10-18 NOTE — Anesthesia Postprocedure Evaluation (Signed)
  Anesthesia Post-op Note  Patient: Roy [name]  Procedure(s) Performed: Procedure(s) (LRB): CYSTOSCOPY WITH LITHOLAPAXY (N/A)  Patient Location: PACU  Anesthesia Type: General  Level of Consciousness: oriented and sedated  Airway and Oxygen Therapy: Patient Spontanous Breathing and Patient connected to nasal cannula oxygen  Post-op Pain: mild  Post-op Assessment: Post-op Vital signs reviewed, Patient's Cardiovascular Status Stable, Respiratory Function Stable and Patent Airway  Post-op Vital Signs: stable  Complications: No apparent anesthesia complications

## 2011-10-18 NOTE — Progress Notes (Signed)
Small amt bloody drainage to penis.  Assisted to br.  Area washed by pt . Gauze wrapped around cath tip.  And extra gauze placed in undergarment.  Pt to remove when needed.

## 2011-10-18 NOTE — Anesthesia Preprocedure Evaluation (Addendum)
Anesthesia Evaluation  Patient identified by MRN, date of birth, ID band Patient awake    Reviewed: Allergy & Precautions, H&P , NPO status , Patient's Chart, lab work & pertinent test results, reviewed documented beta blocker date and time   Airway Mallampati: II TM Distance: >3 FB Neck ROM: Full    Dental  (+) Partial Lower, Partial Upper, Poor Dentition and Loose Two loose front bottom teeth:   Pulmonary sleep apnea and Continuous Positive Airway Pressure Ventilation , COPDformer smoker,  breath sounds clear to auscultation        Cardiovascular hypertension, Pt. on medications Rhythm:Regular Rate:Normal  Denies cardiac symptoms   Neuro/Psych negative neurological ROS  negative psych ROS   GI/Hepatic negative GI ROS, Neg liver ROS,   Endo/Other  Type 2, Oral Hypoglycemic Agents  Renal/GU negative Renal ROS? Left hydronephrosis   Bladder stone Hx prostate cancer negative genitourinary   Musculoskeletal negative musculoskeletal ROS (+)   Abdominal   Peds negative pediatric ROS (+)  Hematology negative hematology ROS (+)   Anesthesia Other Findings   Reproductive/Obstetrics negative OB ROS                         Anesthesia Physical Anesthesia Plan  ASA: III  Anesthesia Plan: General   Post-op Pain Management:    Induction: Intravenous  Airway Management Planned: LMA  Additional Equipment:   Intra-op Plan:   Post-operative Plan: Extubation in OR  Informed Consent: I have reviewed the patients History and Physical, chart, labs and discussed the procedure including the risks, benefits and alternatives for the proposed anesthesia with the patient or authorized representative who has indicated his/her understanding and acceptance.   Dental advisory given  Plan Discussed with: CRNA and Surgeon  Anesthesia Plan Comments:         Anesthesia Quick Evaluation

## 2011-10-21 ENCOUNTER — Encounter (HOSPITAL_BASED_OUTPATIENT_CLINIC_OR_DEPARTMENT_OTHER): Payer: Self-pay | Admitting: Urology

## 2012-02-25 LAB — CBC AND DIFFERENTIAL: WBC: 7.1 10^3/mL

## 2012-05-27 ENCOUNTER — Encounter: Payer: Self-pay | Admitting: Hematology

## 2012-09-26 ENCOUNTER — Encounter (HOSPITAL_COMMUNITY): Payer: Self-pay | Admitting: Emergency Medicine

## 2012-09-26 ENCOUNTER — Emergency Department (HOSPITAL_COMMUNITY): Payer: Medicare Other | Admitting: Anesthesiology

## 2012-09-26 ENCOUNTER — Inpatient Hospital Stay: Admit: 2012-09-26 | Payer: Self-pay | Admitting: Urology

## 2012-09-26 ENCOUNTER — Encounter (HOSPITAL_COMMUNITY)
Admission: EM | Disposition: A | Discharge status: Home / Self Care | Payer: Self-pay | Source: Home / Self Care | Attending: Urology

## 2012-09-26 ENCOUNTER — Emergency Department (INDEPENDENT_AMBULATORY_CARE_PROVIDER_SITE_OTHER)
Admission: EM | Admit: 2012-09-26 | Discharge: 2012-09-26 | Disposition: A | Discharge status: Hospice | Payer: Medicare Other | Source: Home / Self Care | Attending: Emergency Medicine | Admitting: Emergency Medicine

## 2012-09-26 ENCOUNTER — Encounter (HOSPITAL_COMMUNITY): Payer: Self-pay | Admitting: Anesthesiology

## 2012-09-26 ENCOUNTER — Inpatient Hospital Stay (HOSPITAL_COMMUNITY)
Admission: EM | Admit: 2012-09-26 | Discharge: 2012-09-29 | DRG: 666 | Disposition: A | Discharge status: Home / Self Care | Payer: Medicare Other | Attending: Urology | Admitting: Urology

## 2012-09-26 DIAGNOSIS — Z87442 Personal history of urinary calculi: Secondary | ICD-10-CM

## 2012-09-26 DIAGNOSIS — N3289 Other specified disorders of bladder: Principal | ICD-10-CM | POA: Diagnosis present

## 2012-09-26 DIAGNOSIS — Z7982 Long term (current) use of aspirin: Secondary | ICD-10-CM

## 2012-09-26 DIAGNOSIS — J449 Chronic obstructive pulmonary disease, unspecified: Secondary | ICD-10-CM | POA: Diagnosis present

## 2012-09-26 DIAGNOSIS — R339 Retention of urine, unspecified: Secondary | ICD-10-CM | POA: Diagnosis present

## 2012-09-26 DIAGNOSIS — G4733 Obstructive sleep apnea (adult) (pediatric): Secondary | ICD-10-CM

## 2012-09-26 DIAGNOSIS — R319 Hematuria, unspecified: Secondary | ICD-10-CM

## 2012-09-26 DIAGNOSIS — N3040 Irradiation cystitis without hematuria: Secondary | ICD-10-CM | POA: Diagnosis present

## 2012-09-26 DIAGNOSIS — C61 Malignant neoplasm of prostate: Secondary | ICD-10-CM | POA: Diagnosis present

## 2012-09-26 DIAGNOSIS — K219 Gastro-esophageal reflux disease without esophagitis: Secondary | ICD-10-CM | POA: Diagnosis present

## 2012-09-26 DIAGNOSIS — E119 Type 2 diabetes mellitus without complications: Secondary | ICD-10-CM | POA: Diagnosis present

## 2012-09-26 DIAGNOSIS — Z923 Personal history of irradiation: Secondary | ICD-10-CM

## 2012-09-26 DIAGNOSIS — I10 Essential (primary) hypertension: Secondary | ICD-10-CM | POA: Diagnosis present

## 2012-09-26 DIAGNOSIS — J4489 Other specified chronic obstructive pulmonary disease: Secondary | ICD-10-CM | POA: Diagnosis present

## 2012-09-26 HISTORY — PX: CYSTOSCOPY: SHX5120

## 2012-09-26 LAB — POCT I-STAT, CHEM 8
Calcium, Ion: 1.37 mmol/L — ABNORMAL HIGH (ref 1.13–1.30)
Glucose, Bld: 180 mg/dL — ABNORMAL HIGH (ref 70–99)
HCT: 43 % (ref 39.0–52.0)
Hemoglobin: 14.6 g/dL (ref 13.0–17.0)
TCO2: 23 mmol/L (ref 0–100)

## 2012-09-26 LAB — URINALYSIS, ROUTINE W REFLEX MICROSCOPIC
Bilirubin Urine: NEGATIVE
Glucose, UA: NEGATIVE mg/dL
Nitrite: NEGATIVE
Specific Gravity, Urine: 1.014 (ref 1.005–1.030)
pH: 6.5 (ref 5.0–8.0)

## 2012-09-26 LAB — CBC WITH DIFFERENTIAL/PLATELET
Basophils Relative: 0 % (ref 0–1)
Eosinophils Absolute: 0 10*3/uL (ref 0.0–0.7)
Eosinophils Relative: 0 % (ref 0–5)
HCT: 40.6 % (ref 39.0–52.0)
Hemoglobin: 14.2 g/dL (ref 13.0–17.0)
MCH: 31.3 pg (ref 26.0–34.0)
MCHC: 35 g/dL (ref 30.0–36.0)
Monocytes Absolute: 0.8 10*3/uL (ref 0.1–1.0)
Monocytes Relative: 6 % (ref 3–12)

## 2012-09-26 LAB — POCT URINALYSIS DIP (DEVICE)
Bilirubin Urine: NEGATIVE
Ketones, ur: NEGATIVE mg/dL
Specific Gravity, Urine: 1.025 (ref 1.005–1.030)
pH: 6.5 (ref 5.0–8.0)

## 2012-09-26 LAB — GLUCOSE, CAPILLARY: Glucose-Capillary: 163 mg/dL — ABNORMAL HIGH (ref 70–99)

## 2012-09-26 SURGERY — CYSTOSCOPY
Anesthesia: General | Wound class: Clean Contaminated

## 2012-09-26 MED ORDER — TAMSULOSIN HCL 0.4 MG PO CAPS
0.4000 mg | ORAL_CAPSULE | Freq: Every day | ORAL | Status: DC
  Administered 2012-09-26 – 2012-09-29 (×4): 0.4 mg via ORAL
  Filled 2012-09-26 (×4): qty 1

## 2012-09-26 MED ORDER — OXYCODONE HCL 5 MG PO TABS
5.0000 mg | ORAL_TABLET | ORAL | Status: DC | PRN
  Administered 2012-09-27 – 2012-09-28 (×2): 5 mg via ORAL
  Filled 2012-09-26 (×2): qty 1

## 2012-09-26 MED ORDER — ONDANSETRON HCL 4 MG/2ML IJ SOLN
INTRAMUSCULAR | Status: DC | PRN
  Administered 2012-09-26: 4 mg via INTRAVENOUS

## 2012-09-26 MED ORDER — ONDANSETRON HCL 4 MG/2ML IJ SOLN
4.0000 mg | INTRAMUSCULAR | Status: DC | PRN
Start: 2012-09-26 — End: 2012-09-29

## 2012-09-26 MED ORDER — ALBUTEROL SULFATE HFA 108 (90 BASE) MCG/ACT IN AERS
2.0000 | INHALATION_SPRAY | RESPIRATORY_TRACT | Status: DC | PRN
  Filled 2012-09-26: qty 6.7

## 2012-09-26 MED ORDER — MEPERIDINE HCL 50 MG/ML IJ SOLN: 6.2500 mg | INTRAMUSCULAR | Status: DC | PRN

## 2012-09-26 MED ORDER — HYDRALAZINE HCL 20 MG/ML IJ SOLN
INTRAMUSCULAR | Status: DC | PRN
  Administered 2012-09-26: 3 mg via INTRAVENOUS
  Administered 2012-09-26: 5 mg via INTRAVENOUS

## 2012-09-26 MED ORDER — MORPHINE SULFATE 4 MG/ML IJ SOLN
4.0000 mg | Freq: Once | INTRAMUSCULAR | Status: AC
  Administered 2012-09-26: 4 mg via INTRAVENOUS
  Filled 2012-09-26: qty 1

## 2012-09-26 MED ORDER — LIDOCAINE HCL (CARDIAC) 20 MG/ML IV SOLN
INTRAVENOUS | Status: DC | PRN
  Administered 2012-09-26: 60 mg via INTRAVENOUS

## 2012-09-26 MED ORDER — POTASSIUM CHLORIDE IN NACL 20-0.9 MEQ/L-% IV SOLN
INTRAVENOUS | Status: DC
  Administered 2012-09-26 – 2012-09-28 (×3): via INTRAVENOUS
  Filled 2012-09-26 (×7): qty 1000

## 2012-09-26 MED ORDER — ACETAMINOPHEN 10 MG/ML IV SOLN
1000.0000 mg | Freq: Four times a day (QID) | INTRAVENOUS | Status: AC
  Administered 2012-09-27 (×4): 1000 mg via INTRAVENOUS
  Filled 2012-09-26 (×4): qty 100

## 2012-09-26 MED ORDER — FENTANYL CITRATE 0.05 MG/ML IJ SOLN
INTRAMUSCULAR | Status: DC | PRN
  Administered 2012-09-26: 50 ug via INTRAVENOUS
  Administered 2012-09-26 (×3): 25 ug via INTRAVENOUS
  Administered 2012-09-26: 50 ug via INTRAVENOUS

## 2012-09-26 MED ORDER — SODIUM CHLORIDE 0.9 % IV SOLN
INTRAVENOUS | Status: DC | PRN
  Administered 2012-09-26: 15:00:00 via INTRAVENOUS

## 2012-09-26 MED ORDER — MIDAZOLAM HCL 5 MG/5ML IJ SOLN
INTRAMUSCULAR | Status: DC | PRN
  Administered 2012-09-26 (×2): 0.5 mg via INTRAVENOUS

## 2012-09-26 MED ORDER — SODIUM CHLORIDE 0.9 % IR SOLN
Status: DC | PRN
  Administered 2012-09-26: 3000 mL

## 2012-09-26 MED ORDER — PANTOPRAZOLE SODIUM 40 MG PO TBEC: 40.0000 mg | DELAYED_RELEASE_TABLET | Freq: Every day | ORAL | Status: DC | PRN

## 2012-09-26 MED ORDER — LACTATED RINGERS IV SOLN
INTRAVENOUS | Status: DC | PRN
  Administered 2012-09-26: 17:00:00 via INTRAVENOUS

## 2012-09-26 MED ORDER — SODIUM CHLORIDE 0.9 % IR SOLN: 3000.0000 mL | Status: DC

## 2012-09-26 MED ORDER — FENTANYL CITRATE 0.05 MG/ML IJ SOLN
25.0000 ug | INTRAMUSCULAR | Status: DC | PRN
  Administered 2012-09-26 (×3): 50 ug via INTRAVENOUS

## 2012-09-26 MED ORDER — LIDOCAINE VISCOUS 2 % MT SOLN
15.0000 mL | Freq: Once | OROMUCOSAL | Status: DC
  Filled 2012-09-26: qty 15

## 2012-09-26 MED ORDER — LORATADINE 10 MG PO TABS
10.0000 mg | ORAL_TABLET | Freq: Every day | ORAL | Status: DC
  Administered 2012-09-26 – 2012-09-29 (×4): 10 mg via ORAL
  Filled 2012-09-26 (×4): qty 1

## 2012-09-26 MED ORDER — INSULIN ASPART 100 UNIT/ML ~~LOC~~ SOLN: 0.0000 [IU] | Freq: Every day | SUBCUTANEOUS | Status: DC

## 2012-09-26 MED ORDER — TORSEMIDE 100 MG PO TABS
100.0000 mg | ORAL_TABLET | Freq: Every day | ORAL | Status: DC
  Administered 2012-09-26 – 2012-09-29 (×4): 100 mg via ORAL
  Filled 2012-09-26 (×4): qty 1

## 2012-09-26 MED ORDER — GABAPENTIN 100 MG PO CAPS
100.0000 mg | ORAL_CAPSULE | Freq: Every day | ORAL | Status: DC
  Administered 2012-09-26 – 2012-09-29 (×4): 100 mg via ORAL
  Filled 2012-09-26 (×4): qty 1

## 2012-09-26 MED ORDER — PROPOFOL 10 MG/ML IV BOLUS
INTRAVENOUS | Status: DC | PRN
  Administered 2012-09-26: 160 mg via INTRAVENOUS

## 2012-09-26 MED ORDER — CYCLOBENZAPRINE HCL 5 MG PO TABS
5.0000 mg | ORAL_TABLET | Freq: Three times a day (TID) | ORAL | Status: DC | PRN
  Filled 2012-09-26: qty 1

## 2012-09-26 MED ORDER — METOPROLOL TARTRATE 1 MG/ML IV SOLN
2.5000 mg | INTRAVENOUS | Status: DC | PRN
  Administered 2012-09-26 (×3): 2.5 mg via INTRAVENOUS

## 2012-09-26 MED ORDER — SENNA 8.6 MG PO TABS
1.0000 | ORAL_TABLET | Freq: Two times a day (BID) | ORAL | Status: DC
  Administered 2012-09-26 – 2012-09-29 (×6): 8.6 mg via ORAL
  Filled 2012-09-26 (×6): qty 1

## 2012-09-26 MED ORDER — INSULIN ASPART 100 UNIT/ML ~~LOC~~ SOLN
0.0000 [IU] | Freq: Three times a day (TID) | SUBCUTANEOUS | Status: DC
  Administered 2012-09-27 – 2012-09-28 (×4): 2 [IU] via SUBCUTANEOUS
  Administered 2012-09-29: 3 [IU] via SUBCUTANEOUS
  Administered 2012-09-29: 2 [IU] via SUBCUTANEOUS
  Administered 2012-09-29: 3 [IU] via SUBCUTANEOUS

## 2012-09-26 MED ORDER — LIDOCAINE HCL 2 % EX GEL
Freq: Once | CUTANEOUS | Status: AC
  Administered 2012-09-26: 20 via URETHRAL
  Filled 2012-09-26: qty 20

## 2012-09-26 MED ORDER — DOCUSATE SODIUM 100 MG PO CAPS
100.0000 mg | ORAL_CAPSULE | Freq: Two times a day (BID) | ORAL | Status: DC
  Administered 2012-09-26 – 2012-09-29 (×6): 100 mg via ORAL
  Filled 2012-09-26 (×7): qty 1

## 2012-09-26 MED ORDER — HYDROMORPHONE HCL PF 1 MG/ML IJ SOLN: 0.5000 mg | INTRAMUSCULAR | Status: DC | PRN

## 2012-09-26 MED ORDER — INSULIN ASPART 100 UNIT/ML ~~LOC~~ SOLN
4.0000 [IU] | Freq: Three times a day (TID) | SUBCUTANEOUS | Status: DC
  Administered 2012-09-27 – 2012-09-29 (×8): 4 [IU] via SUBCUTANEOUS

## 2012-09-26 MED ORDER — PROMETHAZINE HCL 25 MG/ML IJ SOLN: 6.2500 mg | INTRAMUSCULAR | Status: DC | PRN

## 2012-09-26 MED ORDER — OXYBUTYNIN CHLORIDE 5 MG PO TABS
5.0000 mg | ORAL_TABLET | Freq: Three times a day (TID) | ORAL | Status: DC | PRN
  Administered 2012-09-27: 5 mg via ORAL
  Filled 2012-09-26 (×2): qty 1

## 2012-09-26 MED ORDER — SUCCINYLCHOLINE CHLORIDE 20 MG/ML IJ SOLN
INTRAMUSCULAR | Status: DC | PRN
  Administered 2012-09-26: 80 mg via INTRAVENOUS

## 2012-09-26 MED ORDER — CEFTRIAXONE SODIUM 1 G IJ SOLR
1.0000 g | Freq: Once | INTRAMUSCULAR | Status: AC
  Administered 2012-09-26: 1 g via INTRAVENOUS
  Filled 2012-09-26: qty 10

## 2012-09-26 MED ORDER — CEFTRIAXONE SODIUM 1 G IJ SOLR
1.0000 g | INTRAMUSCULAR | Status: DC
  Administered 2012-09-27 – 2012-09-29 (×3): 1 g via INTRAVENOUS
  Filled 2012-09-26 (×4): qty 10

## 2012-09-26 MED ORDER — POTASSIUM CHLORIDE CRYS ER 20 MEQ PO TBCR
20.0000 meq | EXTENDED_RELEASE_TABLET | Freq: Every day | ORAL | Status: DC
  Administered 2012-09-26 – 2012-09-29 (×4): 20 meq via ORAL
  Filled 2012-09-26 (×4): qty 1

## 2012-09-26 SURGICAL SUPPLY — 22 items
BAG URINE DRAINAGE (UROLOGICAL SUPPLIES) ×2 IMPLANT
BAG URO CATCHER STRL LF (DRAPE) ×2 IMPLANT
BASKET LASER NITINOL 1.9FR (BASKET) IMPLANT
BSKT STON RTRVL 120 1.9FR (BASKET)
CATH FOLEY 3WAY 30CC 22FR (CATHETERS) ×2 IMPLANT
CATH INTERMIT  6FR 70CM (CATHETERS) IMPLANT
CATH URET 5FR 28IN OPEN ENDED (CATHETERS) IMPLANT
CLOTH BEACON ORANGE TIMEOUT ST (SAFETY) ×2 IMPLANT
DRAPE CAMERA CLOSED 9X96 (DRAPES) ×2 IMPLANT
DRESSING TELFA 8X3 (GAUZE/BANDAGES/DRESSINGS) ×2 IMPLANT
ELECT BUTTON HF 24-28F 2 30DE (ELECTRODE) ×2 IMPLANT
ELECT LOOP MED HF 24F 12D (CUTTING LOOP) ×2 IMPLANT
GLOVE BIOGEL M STRL SZ7.5 (GLOVE) ×2 IMPLANT
GOWN PREVENTION PLUS XLARGE (GOWN DISPOSABLE) ×2 IMPLANT
GOWN STRL NON-REIN LRG LVL3 (GOWN DISPOSABLE) ×2 IMPLANT
GUIDEWIRE ANG ZIPWIRE 038X150 (WIRE) ×2 IMPLANT
GUIDEWIRE STR DUAL SENSOR (WIRE) ×2 IMPLANT
MANIFOLD NEPTUNE II (INSTRUMENTS) ×2 IMPLANT
PACK CYSTO (CUSTOM PROCEDURE TRAY) ×2 IMPLANT
SYRINGE IRR TOOMEY STRL 70CC (SYRINGE) ×2 IMPLANT
TUBE FEEDING 8FR 16IN STR KANG (MISCELLANEOUS) ×2 IMPLANT
TUBING CONNECTING 10 (TUBING) ×2 IMPLANT

## 2012-09-26 NOTE — ED Provider Notes (Signed)
History    CSN: [id number] Arrival date & time 09/26/12  1012  First MD Initiated Contact with Patient 09/26/12 1029     Chief Complaint  Patient presents with  . Hematuria   (Consider location/radiation/quality/duration/timing/severity/associated sxs/prior Treatment) HPI Comments: Patient is a 77YOAAM with a PMH of bladder stones, HTN, DM, BPH, and prostate cancer s/p radiation.  He presents with blood in his urine that began yesterday accompanied by painful urination, frequency, and incontinence.  He denies seeing any frank blood.  He denies any fever, chills, N/V/D/C, or other pain.  He does not have a h/o any urinary tract infections, but does report having bladder stones a couple of years ago.  He reports he used to smoke since he was 77 years old, but quit 15 years ago.  He drinks an occasional beer.  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    Past Medical History  Diagnosis Date  . Diabetes mellitus   . Hypertension   . OSA on CPAP   . GERD (gastroesophageal reflux disease)   . History of prostate cancer 2010-- S/P  EXTERNAL RADIATION  . Hematuria   . Bladder calculi   . Hydronephrosis, left   . Dyspnea on exertion   . Arthritis HANDS/ THUMBS/ SHOULDERS  . Chronic obstructive asthma, unspecified FOLLOWED BY DR Tarri Fuller YOUNG  VISIT 10-14-2011 IN [facility]  . COPD (chronic obstructive pulmonary disease)    Past Surgical History  Procedure Laterality Date  . Ganglion cyst excision  1988  (APPROX)    LEFT KNEE  . Carpal tunnel release  05-28-2005    LEFT  . Inguinal hernia repair  1955  . Cystoscopy with litholapaxy  10/18/2011    Procedure: CYSTOSCOPY WITH LITHOLAPAXY;  Surgeon: Hanley Ben, MD;  Location: [place];  Service: Urology;  Laterality: N/A;  1 HOUR NO STENT H# [id number] CELL# [id number]  MEDICARE BCBS   Family History  Problem Relation Age of Onset  . Diabetes Father    History  Substance Use Topics  . Smoking status:  Former Smoker -- 0.50 packs/day for 25 years    Types: Cigarettes    Quit date: 03/25/1998  . Smokeless tobacco: Never Used  . Alcohol Use: Yes     Comment: OCCASIONAL BEER    Review of Systems  Constitutional: Positive for chills and fatigue. Negative for fever, appetite change and unexpected weight change.  Respiratory: Negative for cough and shortness of breath.   Cardiovascular: Negative for chest pain and leg swelling.  Gastrointestinal: Positive for abdominal pain (suprapubic tenderness). Negative for nausea, vomiting, diarrhea, constipation, blood in stool, abdominal distention and rectal pain.  Genitourinary: Positive for dysuria, urgency, frequency, hematuria, decreased urine volume and difficulty urinating. Negative for flank pain, scrotal swelling, penile pain and testicular pain.  Musculoskeletal: Positive for arthralgias.  Hematological: Does not bruise/bleed easily.  All other systems reviewed and are negative.    Allergies  Review of patient's allergies indicates no known allergies.  Home Medications   Current Outpatient Rx  Name  Route  Sig  Dispense  Refill  . amitriptyline (ELAVIL) 10 MG tablet   Oral   Take 10 mg by mouth at bedtime as needed for sleep.          . Ascorbic Acid (VITAMIN C) 1000 MG tablet   Oral   Take 1,000 mg by mouth daily.          [name] aspirin 81 MG tablet  Oral   Take 81 mg by mouth daily.         . cyclobenzaprine (FLEXERIL) 5 MG tablet   Oral   Take 5 mg by mouth as needed for muscle spasms.          [name] diltiazem (TIAZAC) 360 MG 24 hr capsule   Oral   Take 360 mg by mouth daily as needed (High blood pressure Systolic>130).         . gabapentin (NEURONTIN) 100 MG capsule   Oral   Take 100 mg by mouth daily.         [name] glimepiride (AMARYL) 4 MG tablet   Oral   Take 4 mg by mouth daily as needed (High blood sugar >120).          [name] lisinopril (PRINIVIL,ZESTRIL) 40 MG tablet   Oral   Take 40 mg by mouth daily as  needed (High blood pressure >130).          [name] loratadine (CLARITIN) 10 MG tablet   Oral   Take 10 mg by mouth daily.         . pantoprazole (PROTONIX) 40 MG tablet   Oral   Take 40 mg by mouth daily as needed (Acid reflux).          . polyethylene glycol (MIRALAX / GLYCOLAX) packet   Oral   Take 17 g by mouth as needed.          . potassium chloride SA (K-DUR,KLOR-CON) 20 MEQ tablet   Oral   Take 20 mEq by mouth daily.          . sitaGLIPtin (JANUVIA) 100 MG tablet   Oral   Take 100 mg by mouth daily as needed (High blood sugar >120).          . tamsulosin (FLOMAX) 0.4 MG CAPS   Oral   Take 0.4 mg by mouth daily.         [name] torsemide (DEMADEX) 100 MG tablet   Oral   Take 100 mg by mouth daily.          [name] albuterol (PROAIR HFA) 108 (90 BASE) MCG/ACT inhaler   Inhalation   Inhale 2 puffs into the lungs 4 (four) times daily as needed.           BP 189/86  Pulse 93  Temp(Src) 97.8 F (36.6 C) (Oral)  Resp 20  SpO2 97% Physical Exam  Constitutional: He is oriented to person, place, and time. He appears well-developed and well-nourished. No distress.  HENT:  Head: Normocephalic and atraumatic.  Eyes: Conjunctivae and EOM are normal. Pupils are equal, round, and reactive to light.  Neck: Neck supple.  Cardiovascular: Normal rate, regular rhythm, normal heart sounds and intact distal pulses.  Exam reveals no gallop and no friction rub.   No murmur heard. Pulmonary/Chest: Effort normal and breath sounds normal.  Abdominal: Soft. Bowel sounds are normal. He exhibits no distension. There is tenderness (suprapubic tenderness) in the suprapubic area. There is no rigidity, no rebound and no CVA tenderness. A hernia (small umbilical hernia) is present.  Neurological: He is alert and oriented to person, place, and time.  Skin: Skin is warm and dry. He is not diaphoretic.    ED Course  Procedures (including critical care time) Labs Reviewed  URINALYSIS,  ROUTINE W REFLEX MICROSCOPIC - Abnormal; Notable for the following:    APPearance TURBID (*)    Hgb urine dipstick LARGE (*)  Protein, ur >300 (*)    Leukocytes, UA LARGE (*)    All other components within normal limits  CBC WITH DIFFERENTIAL - Abnormal; Notable for the following:    WBC 12.8 (*)    Neutrophils Relative % 84 (*)    Neutro Abs 10.8 (*)    Lymphocytes Relative 9 (*)    All other components within normal limits  URINE MICROSCOPIC-ADD ON - Abnormal; Notable for the following:    Bacteria, UA MANY (*)    All other components within normal limits  POCT I-STAT, CHEM 8 - Abnormal; Notable for the following:    BUN 25 (*)    Creatinine, Ser 1.80 (*)    Glucose, Bld 180 (*)    Calcium, Ion 1.37 (*)    All other components within normal limits  URINE CULTURE   No results found. 1. Hematuria     MDM  We ordered an I-stat chem 8, cbc with diff, UA with culture, and a bladder scan.  His bladder scan revealed a PVR volume over 700.  We attempted to put a foley catheter in the bladder, but the tubing became clogged with clotted blood.  We consulted urology as he is known to Dr. Janice Norrie.  Dr. Tresa Moore with urology came by and saw the patient.  He will be transferred to [facility] for further treatment and to the care of Dr. Tresa Moore.  Michail Jewels, MD 09/26/12 1436

## 2012-09-26 NOTE — Anesthesia Preprocedure Evaluation (Signed)
Anesthesia Evaluation  Patient identified by MRN, date of birth, ID band Patient awake    Reviewed: Allergy & Precautions, H&P , Patient's Chart, lab work & pertinent test results, reviewed documented beta blocker date and time   History of Anesthesia Complications Negative for: history of anesthetic complications  Airway Mallampati: II TM Distance: >3 FB Neck ROM: full    Dental no notable dental hx.    Pulmonary neg pulmonary ROS, shortness of breath, asthma , sleep apnea , COPD breath sounds clear to auscultation  Pulmonary exam normal       Cardiovascular Exercise Tolerance: Good hypertension, negative cardio ROS  Rhythm:regular Rate:Normal     Neuro/Psych negative neurological ROS  negative psych ROS   GI/Hepatic negative GI ROS, Neg liver ROS, GERD-  ,  Endo/Other  negative endocrine ROSdiabetes  Renal/GU Renal diseasenegative Renal ROS     Musculoskeletal   Abdominal   Peds  Hematology negative hematology ROS (+)   Anesthesia Other Findings Diabetes mellitus     Hypertension        OSA on CPAP     GERD (gastroesophageal reflux disease)        History of prostate cancer 2010-- S/P  EXTERNAL RADIATION   Hematuria        Bladder calculi     Hydronephrosis, left        Dyspnea on exertion     Arthritis HANDS/ THUMBS/ SHOULDERS      Chronic obstructive asthma, unspecified FOLLOWED BY DR Tarri Fuller YOUNG  VISIT 10-14-2011 IN EPIC   COPD (chronic obstructive pulmonary disease)    Reproductive/Obstetrics negative OB ROS                           Anesthesia Physical Anesthesia Plan  ASA: III  Anesthesia Plan: General LMA   Post-op Pain Management:    Induction:   Airway Management Planned:   Additional Equipment:   Intra-op Plan:   Post-operative Plan:   Informed Consent: I have reviewed the patients History and Physical, chart, labs and discussed the procedure including the  risks, benefits and alternatives for the proposed anesthesia with the patient or authorized representative who has indicated his/her understanding and acceptance.   Dental Advisory Given  Plan Discussed with: CRNA, Surgeon and Anesthesiologist  Anesthesia Plan Comments:         Anesthesia Quick Evaluation

## 2012-09-26 NOTE — Brief Op Note (Signed)
09/26/2012  5:03 PM  PATIENT:  Roy [name]  77 y.o. male  PRE-OPERATIVE DIAGNOSIS:  clots, bladder bleeding  POST-OPERATIVE DIAGNOSIS:  clots, bladder bleeding  PROCEDURE:  Procedure(s): CYSTOSCOPY, clot evacuation,fulgeration of bladder, bladder biopsy, transurethreal vaporation of prostat (N/A)  SURGEON:  Surgeon(s) and Role:    * Alexis Frock, MD - Primary  PHYSICIAN ASSISTANT:   ASSISTANTS: none   ANESTHESIA:   general  EBL:     BLOOD ADMINISTERED:none  DRAINS: 63F 3 way foley to NS irrigation 2 drops per second   LOCAL MEDICATIONS USED:  NONE  SPECIMEN:  Source of Specimen:  Bladder Erythema  DISPOSITION OF SPECIMEN:  PATHOLOGY  COUNTS:  YES  TOURNIQUET:  * No tourniquets in log *  DICTATION: .Other Dictation: Dictation Number [id number]  PLAN OF CARE: Admit to inpatient   PATIENT DISPOSITION:  PACU - hemodynamically stable.   Delay start of Pharmacological VTE agent (>24hrs) due to surgical blood loss or risk of bleeding: yes

## 2012-09-26 NOTE — ED Notes (Signed)
Complaining of blood in urine last night.  Patient states urine color looks like Tea.  Patient is experiencing some pain and burning when urinating.

## 2012-09-26 NOTE — ED Notes (Signed)
Urology at bedside.

## 2012-09-26 NOTE — ED Notes (Signed)
Pt from [facility] c/o hematuria since yesterday. Pt states pink tinged urination, is able to urinate

## 2012-09-26 NOTE — Progress Notes (Signed)
pacu nursing note: Glasses and dentures given to family

## 2012-09-26 NOTE — Transfer of Care (Signed)
Immediate Anesthesia Transfer of Care Note  Patient: Roy [name]  Procedure(s) Performed: Procedure(s): CYSTOSCOPY, clot evacuation,fulgeration of bladder, bladder biopsy, transurethreal vaporation of prostat (N/A)  Patient Location: PACU  Anesthesia Type:General  Level of Consciousness: awake and alert   Airway & Oxygen Therapy: Patient Spontanous Breathing and Patient connected to face mask oxygen  Post-op Assessment: Report given to PACU RN and Post -op Vital signs reviewed and stable  Post vital signs: Reviewed and stable  Complications: No apparent anesthesia complications

## 2012-09-26 NOTE — ED Notes (Signed)
Urology cart at bedside 

## 2012-09-26 NOTE — Progress Notes (Signed)
pacu nursing note:  Dr. Glennon Mac made aware of patients elevated bp.  Orders received.  Vital sign interval changed to 69mins.  Will continue to monitor.

## 2012-09-26 NOTE — H&P (Signed)
Roy [name] is an 77 y.o. male.    Chief Complaint: Gross Hematuria with Urinary Retention, h/o bladder stones and prostate cancer  HPI:   1 - Gross Hematuria with Retention - Pt with 1 day history of difficultly voiding and gross hematuria. No prior episodes. Sen at [facility] urgent care that transferred to [facility] ER where 74F foley placed and 738mL bloody urine effluxed with subsequent clotting of catheter. Takes ASA daily, but no strong blood thinners. No CV disease. Does have h/o prostate CA s/ pXRT as well as radiation proctitis.  2 - Prostate Cancer - s/p XRT 2006, followed by N[facility] with good biochemical control per report.  3 - Bladder Stones - s/p prior procedure for bladder stones 09/2011.  PMH sig for CHF/lasix, appy, prostate cancer, OSA/CPAP. No MI or known CAD.   Today G[facility] is seen as emergent ER consult for above. Cr 1.8, Hgb 14. No fevers or recent dysuria.   Past Medical History  Diagnosis Date  . Diabetes mellitus   . Hypertension   . OSA on CPAP   . GERD (gastroesophageal reflux disease)   . History of prostate cancer 2010-- S/P  EXTERNAL RADIATION  . Hematuria   . Bladder calculi   . Hydronephrosis, left   . Dyspnea on exertion   . Arthritis HANDS/ THUMBS/ SHOULDERS  . Chronic obstructive asthma, unspecified FOLLOWED BY DR Tarri Fuller YOUNG  VISIT 10-14-2011 IN EPIC  . COPD (chronic obstructive pulmonary disease)     Past Surgical History  Procedure Laterality Date  . Ganglion cyst excision  1988  (APPROX)    LEFT KNEE  . Carpal tunnel release  05-28-2005    LEFT  . Inguinal hernia repair  1955  . Cystoscopy with litholapaxy  10/18/2011    Procedure: CYSTOSCOPY WITH LITHOLAPAXY;  Surgeon: Hanley Ben, MD;  Location: [facility];  Service: Urology;  Laterality: N/A;  1 HOUR NO STENT H# [id number] CELL# [id number]  MEDICARE BCBS    Family History  Problem Relation Age of Onset  . Diabetes Father    Social History:  reports that he quit  smoking about 14 years ago. His smoking use included Cigarettes. He has a 12.5 pack-year smoking history. He has never used smokeless tobacco. He reports that  drinks alcohol. He reports that he does not use illicit drugs.  Allergies: No Known Allergies   (Not in a hospital admission)  Results for orders placed during the hospital encounter of 09/26/12 (from the past 48 hour(s))  CBC WITH DIFFERENTIAL     Status: Abnormal   Collection Time    09/26/12 10:55 AM      Result Value Range   WBC 12.8 (*) 4.0 - 10.5 K/uL   RBC 4.54  4.22 - 5.81 MIL/uL   Hemoglobin 14.2  13.0 - 17.0 g/dL   HCT 40.6  39.0 - 52.0 %   MCV 89.4  78.0 - 100.0 fL   MCH 31.3  26.0 - 34.0 pg   MCHC 35.0  30.0 - 36.0 g/dL   RDW 13.7  11.5 - 15.5 %   Platelets 180  150 - 400 K/uL   Neutrophils Relative % 84 (*) 43 - 77 %   Neutro Abs 10.8 (*) 1.7 - 7.7 K/uL   Lymphocytes Relative 9 (*) 12 - 46 %   Lymphs Abs 1.2  0.7 - 4.0 K/uL   Monocytes Relative 6  3 - 12 %   Monocytes Absolute 0.8  0.1 - 1.0 K/uL  Eosinophils Relative 0  0 - 5 %   Eosinophils Absolute 0.0  0.0 - 0.7 K/uL   Basophils Relative 0  0 - 1 %   Basophils Absolute 0.0  0.0 - 0.1 K/uL  POCT I-STAT, CHEM 8     Status: Abnormal   Collection Time    09/26/12 11:09 AM      Result Value Range   Sodium 136  135 - 145 mEq/L   Potassium 3.9  3.5 - 5.1 mEq/L   Chloride 102  96 - 112 mEq/L   BUN 25 (*) 6 - 23 mg/dL   Creatinine, Ser 1.80 (*) 0.50 - 1.35 mg/dL   Glucose, Bld 180 (*) 70 - 99 mg/dL   Calcium, Ion 1.37 (*) 1.13 - 1.30 mmol/L   TCO2 23  0 - 100 mmol/L   Hemoglobin 14.6  13.0 - 17.0 g/dL   HCT 43.0  39.0 - 52.0 %  URINALYSIS, ROUTINE W REFLEX MICROSCOPIC     Status: Abnormal   Collection Time    09/26/12 12:22 PM      Result Value Range   Color, Urine YELLOW  YELLOW   APPearance TURBID (*) CLEAR   Specific Gravity, Urine 1.014  1.005 - 1.030   pH 6.5  5.0 - 8.0   Glucose, UA NEGATIVE  NEGATIVE mg/dL   Hgb urine dipstick LARGE (*)  NEGATIVE   Bilirubin Urine NEGATIVE  NEGATIVE   Ketones, ur NEGATIVE  NEGATIVE mg/dL   Protein, ur >300 (*) NEGATIVE mg/dL   Urobilinogen, UA 0.2  0.0 - 1.0 mg/dL   Nitrite NEGATIVE  NEGATIVE   Leukocytes, UA LARGE (*) NEGATIVE  URINE MICROSCOPIC-ADD ON     Status: Abnormal   Collection Time    09/26/12 12:22 PM      Result Value Range   WBC, UA 21-50  <3 WBC/hpf   RBC / HPF 7-10  <3 RBC/hpf   Bacteria, UA MANY (*) RARE   No results found.  Review of Systems  Constitutional: Negative.  Negative for fever, weight loss and malaise/fatigue.  HENT: Negative.   Eyes: Negative.   Respiratory: Negative.   Cardiovascular: Negative.   Gastrointestinal: Negative.   Genitourinary: Positive for dysuria, urgency, frequency and hematuria.  Musculoskeletal: Negative.   Skin: Negative.   Neurological: Negative.   Endo/Heme/Allergies: Negative.   Psychiatric/Behavioral: Negative.     Blood pressure 189/86, pulse 93, temperature 97.8 F (36.6 C), temperature source Oral, resp. rate 20, SpO2 97.00%. Physical Exam  Constitutional: He is oriented to person, place, and time. He appears well-developed and well-nourished.  Wife at bedside  HENT:  Head: Normocephalic and atraumatic.  Eyes: EOM are normal. Pupils are equal, round, and reactive to light.  Neck: Normal range of motion. Neck supple.  Cardiovascular: Normal rate and regular rhythm.   Respiratory: Effort normal and breath sounds normal.  GI: Soft. Bowel sounds are normal.  Genitourinary: Penis normal.  Dark clotted blood in 50F foley that will not flush. Exchanged using aseptic technqiue for 46F 3-way coude catheter and irrigated by hand with 2L NS to dark pink. Minimal clots, but very bright red blood worrisome for active bleeding.  Musculoskeletal: Normal range of motion.  Neurological: He is alert and oriented to person, place, and time.  Skin: Skin is warm and dry.  Psychiatric: He has a normal mood and affect. His behavior is  normal. Judgment and thought content normal.     Assessment/Plan  1 - Gross Hematuria with Retention -  Pt with impressive blood with only modest clearing with 2L hand irrigation. Discussed options of cysto/clot evac with likely fulgeration and retrogrades v. Continue CBI and observe, they have opted for clot evac. Risks including bleeding, infection, non-cure, non-diagnosis discussed as well as MI, CVA, PE, DVT, Mortality. He is NPO. Will perform urgently at [facility] pending [employer].  Explained DDX radiation cytisits / prostatitis v. Bladder stoens v. Neoplasm v. Upper tract course. He will need renal imaging as well once Cr improves.  2 - Prostate Cancer - s/p primary therapy with XRT.  3 - Bladder Stones - will eval for recurrence today at time of cysto.  4 - Admit post-op.  Claudene Gatliff 09/26/2012, 1:57 PM

## 2012-09-26 NOTE — ED Provider Notes (Signed)
History    CSN: [id number] Arrival date & time 09/26/12  0902  None    Chief Complaint  Patient presents with  . Hematuria   (Consider location/radiation/quality/duration/timing/severity/associated sxs/prior Treatment) HPI Comments: Mr. Sadek is a 77 yo black male with a past medical history of HTN and bladder stones recently placed on Flomax who presents with 24 history of "tea colored" urine, dysuria and hesitancy. He denies fever, chills, abdominal pain or flank pain. No prior history. Otherwise feels well overall. He reports that his Flomax has helped substantially.    Patient is a 77 y.o. male presenting with hematuria.  Hematuria   Past Medical History  Diagnosis Date  . Diabetes mellitus   . Hypertension   . OSA on CPAP   . GERD (gastroesophageal reflux disease)   . History of prostate cancer 2010-- S/P  EXTERNAL RADIATION  . Hematuria   . Bladder calculi   . Hydronephrosis, left   . Dyspnea on exertion   . Arthritis HANDS/ THUMBS/ SHOULDERS  . Chronic obstructive asthma, unspecified FOLLOWED BY DR Tarri Fuller YOUNG  VISIT 10-14-2011 IN EPIC  . COPD (chronic obstructive pulmonary disease)    Past Surgical History  Procedure Laterality Date  . Ganglion cyst excision  1988  (APPROX)    LEFT KNEE  . Carpal tunnel release  05-28-2005    LEFT  . Inguinal hernia repair  1955  . Cystoscopy with litholapaxy  10/18/2011    Procedure: CYSTOSCOPY WITH LITHOLAPAXY;  Surgeon: Hanley Ben, MD;  Location: [facility];  Service: Urology;  Laterality: N/A;  1 HOUR NO STENT H# [id number] CELL# [id number]  MEDICARE BCBS   Family History  Problem Relation Age of Onset  . Diabetes Father    History  Substance Use Topics  . Smoking status: Former Smoker -- 0.50 packs/day for 25 years    Types: Cigarettes    Quit date: 03/25/1998  . Smokeless tobacco: Never Used  . Alcohol Use: Yes     Comment: OCCASIONAL BEER    Review of Systems  Genitourinary: Positive  for hematuria.  All other systems reviewed and are negative.    Allergies  Review of patient's allergies indicates no known allergies.  Home Medications   Current Outpatient Rx  Name  Route  Sig  Dispense  Refill  . albuterol (PROAIR HFA) 108 (90 BASE) MCG/ACT inhaler   Inhalation   Inhale 2 puffs into the lungs 4 (four) times daily as needed.          [name] amitriptyline (ELAVIL) 10 MG tablet   Oral   Take 10 mg by mouth at bedtime as needed.          . Ascorbic Acid (VITAMIN C) 1000 MG tablet   Oral   Take 1,000 mg by mouth daily.          [name] aspirin 81 MG tablet   Oral   Take 81 mg by mouth daily.         . Calcium-Vitamin D 600-125 MG-UNIT TABS   Oral   Take by mouth daily.          . cyanocobalamin 500 MCG tablet   Oral   Take 500 mcg by mouth daily.          . cyclobenzaprine (FLEXERIL) 5 MG tablet   Oral   Take 5 mg by mouth as needed.          . diltiazem (CARDIZEM CD) 300 MG 24 hr capsule  Oral   Take 300 mg by mouth every morning.          . Ginkgo Biloba 60 MG TABS   Oral   Take by mouth daily.          [name] glimepiride (AMARYL) 4 MG tablet   Oral   Take 4 mg by mouth daily before breakfast.          . lisinopril (PRINIVIL,ZESTRIL) 40 MG tablet   Oral   Take 40 mg by mouth daily.          . pantoprazole (PROTONIX) 40 MG tablet   Oral   Take 40 mg by mouth every morning.          . polyethylene glycol (MIRALAX / GLYCOLAX) packet   Oral   Take 17 g by mouth as needed.          . potassium chloride SA (K-DUR,KLOR-CON) 20 MEQ tablet   Oral   Take 20 mEq by mouth daily.          . QUEtiapine (SEROQUEL) 25 MG tablet   Oral   Take 25 mg by mouth at bedtime as needed.          . sitaGLIPtin (JANUVIA) 100 MG tablet   Oral   Take 100 mg by mouth daily.          [name] torsemide (DEMADEX) 100 MG tablet   Oral   Take 100 mg by mouth daily.           BP 157/77  Pulse 101  Temp(Src) 98.2 F (36.8 C) (Oral)  Resp  14  SpO2 96% Physical Exam  Constitutional: He is oriented to person, place, and time. He appears well-developed and well-nourished.  HENT:  Head: Normocephalic and atraumatic.  Cardiovascular: Normal rate.   Pulmonary/Chest: Effort normal and breath sounds normal.  Abdominal: Soft. Bowel sounds are normal. He exhibits no distension and no mass. There is no tenderness. There is no rebound and no guarding.  Neurological: He is alert and oriented to person, place, and time.  Skin: Skin is warm and dry.  Psychiatric: His behavior is normal.    ED Course  Procedures (including critical care time) Labs Reviewed - No data to display No results found. No diagnosis found.  MDM  77 yo black male with BPH, HTN and bladder stones with 24 hours of urinary retention, gross hematuria and Proteinuria. Distended bladder on exam. Discussed with Dr. Jake Michaelis will transfer for evaluation of possible Hydronephrosis and for cathatererization.   Leafy Kindle, PA-C 09/26/12 1002

## 2012-09-26 NOTE — ED Provider Notes (Signed)
I have supervised the resident on the management of this patient and agree with the note above. I personally interviewed and examined the patient and my addendum is below.   Roy [name] is a 77 y.o. male hx of BPH, prostate ca s/p radiation here with hematuria. Gross hematuria and unable to urinate much today. Sent from urgent care. Post void residual was 700 cc. 3 F foley placed by nursing but there is gross hematuria and the foley clotted off. I called Dr. Tammi Klippel from urology, who came and placed larger foley and started on CBI. Patient still had hematuria and went to the OR for further management. His Cr 1.8, no baseline. UA + UTI as well so given ceftriaxone.   CRITICAL CARE Performed by: Darl Householder, Tiarrah Saville   Total critical care time: 30 min   Critical care time was exclusive of separately billable procedures and treating other patients.  Critical care was necessary to treat or prevent imminent or life-threatening deterioration.  Critical care was time spent personally by me on the following activities: development of treatment plan with patient and/or surrogate as well as nursing, discussions with consultants, evaluation of patient's response to treatment, examination of patient, obtaining history from patient or surrogate, ordering and performing treatments and interventions, ordering and review of laboratory studies, ordering and review of radiographic studies, pulse oximetry and re-evaluation of patient's condition.   Procedure ED bladder ultrasound- bladder distended. Post void residual is 700 cc.    Wandra Arthurs, MD 09/26/12 [phone]

## 2012-09-26 NOTE — ED Provider Notes (Signed)
Medical screening examination/treatment/procedure(s) were performed by non-physician practitioner and as supervising physician I was immediately available for consultation/collaboration.  Philipp Deputy, M.D.  Harden Mo, MD 09/26/12 [phone]

## 2012-09-27 LAB — GLUCOSE, CAPILLARY
Glucose-Capillary: 130 mg/dL — ABNORMAL HIGH (ref 70–99)
Glucose-Capillary: 130 mg/dL — ABNORMAL HIGH (ref 70–99)
Glucose-Capillary: 134 mg/dL — ABNORMAL HIGH (ref 70–99)

## 2012-09-27 LAB — HEMOGLOBIN AND HEMATOCRIT, BLOOD
HCT: 44.2 % (ref 39.0–52.0)
Hemoglobin: 14.9 g/dL (ref 13.0–17.0)

## 2012-09-27 LAB — BASIC METABOLIC PANEL
CO2: 24 mEq/L (ref 19–32)
Chloride: 102 mEq/L (ref 96–112)
GFR calc Af Amer: 47 mL/min — ABNORMAL LOW (ref >90)
Potassium: 4.4 mEq/L (ref 3.5–5.1)

## 2012-09-27 NOTE — Anesthesia Postprocedure Evaluation (Signed)
  Anesthesia Post Note  Patient: Roy [name]  Procedure(s) Performed: Procedure(s) (LRB): CYSTOSCOPY, clot evacuation,fulgeration of bladder, bladder biopsy, transurethreal vaporation of prostat (N/A)  Anesthesia type: GA  Patient location: PACU  Post pain: Pain level controlled  Post assessment: Post-op Vital signs reviewed  Last Vitals:  Filed Vitals:   09/27/12 0500  BP: 129/61  Pulse: 87  Temp: 36.9 C  Resp: 16    Post vital signs: Reviewed  Level of consciousness: sedated  Complications: No apparent anesthesia complications

## 2012-09-27 NOTE — Op Note (Signed)
[name], [name] NO.:  [id number]  MEDICAL RECORD NO.:  [id number]  LOCATION:  [id number]                         FACILITY:  [facility]  PHYSICIAN:  Alexis Frock, MD     DATE OF BIRTH:  09-Dec-1935  DATE OF PROCEDURE: 09/26/2012 DATE OF DISCHARGE:                              OPERATIVE REPORT   DIAGNOSES:  Refractory hematuria with clot retention, history of prostate cancer status post radiation, history of bladder stones.  PROCEDURE: 1. Cystoscopy with clot evacuation, fulguration of bleeders. 2. Bladder biopsy. 3. Transurethral vaporization of prostate.  ESTIMATED BLOOD LOSS:  200 mL.  COMPLICATIONS:  None.  SPECIMEN:  Bladder erythema, history of a pelvic radiation.  FINDINGS: 1. Severe radiation cystitis changes throughout the entire urinary     bladder number. 2. Enlarged median lobe of the prostate, very obstructing and friable     with active bleeding. 3. Fulguration of approximately 70% bladder surface area. 4. Vaporization of the median lobe of the prostate, bringing flush to     the posterior bladder.  DRAINS:  A 22-French 3-way Foley catheter to normal saline irrigation and 2 drops for second efflux light pink.  INDICATION:  Roy [name] is a pleasant 77 year old gentleman with remote history of prostate cancer status post primary radiotherapy.  He has had several sequelae including prior moderate obstruction with bladder stones, requiring cystolitholapaxy as well as radiation proctitis.  He presented this morning to [facility] ER with approximately 12-hour history of gross hematuria and difficulty voiding.  He was found to be in clot retention.  He underwent 3-way Foley catheter and bedside irrigation, however, he was unable to be irrigated clear.  Options were discussed including observation versus cystoscopy with clot evacuation and fulguration, and they wished to proceed with the latter.  Informed consent was obtained and placed in medical  record.  PROCEDURE IN DETAIL:  The patient being Roy [name] verified. Procedure being cystoscopy, clot evacuation, and fulguration was confirmed.  Procedure was carried out.  Time-out was performed. Intravenous antibiotics administered.  General LMA anesthesia was introduced.  The patient was placed into a low lithotomy position. Sterile field was created by prepping and draping the patient's penis, perineum, and proximal thighs using iodine x3.  Next, cystourethroscopy was performed using a 26-French ACMI resectoscope sheath with visual obturator and 30 degree offset lens.  Inspection of the anterior and posterior urethra revealed impressive median lobe hypertrophy of the prostate that was friable and very obstructing appearing.  Inspection of bladder revealed a very impressive  radiation cystitis throughout all quadrants of the urinary bladder.  Ureteral orifices were present in the normal anatomic position.  Attention was directed at fulguration of the radiation cystitis as this area was actively oozing significantly.  The resectoscope medium-sized loop was used bipolar energy was applied in a systematic fashion Fulgurating approximately 70% of the entire circumference of the bladder.  Great care was taken to avoid fulguration of the area of ureteral orifices, this did not occur, resulted in excellent hemostasis from the bladder oozing, however, given the patient's very obstructing median lobe, history bladder stones and friability of the median lobe, we felt that vaporization of this was warranted as such  the button vaporization electrode was used to vaporize this large median lobe bringing flush to the posterior aspect of the bladder.  The lateral lobes of the prostate were not addressed whatsoever as they appeared to be of moderate size.  Final inspection revealed excellent hemostasis, resolution of all obstructing median lobe tissue, non-injury to the ureteral orifices, no  evidence of bladder perforation.  Finally cold cup biopsy forceps were used to obtain represented bladder biopsy from the posterior trigone, Procedure being cystoscopy labeled bladder erythema, history of pelvic radiation.  The sites were inspected and again fulgurated and hemostasis was excellent and again no evidence of bladder perforation.  As such, resectoscope was exchanged for the 22-French 3-way Foley catheter, a 20 mL sterile water, and the balloon.  Using normal saline irrigation  of two drops per second, the efflux was light pink.  Procedure was then terminated.  The patient tolerated the procedure well.  There were no immediate periprocedural complications.  The patient was taken to the postanesthesia care unit in stable condition.          ______________________________ Alexis Frock, MD     TM/MEDQ  D:  09/26/2012  T:  09/27/2012  Job:  [id number]

## 2012-09-27 NOTE — Progress Notes (Signed)
Patient ID: Roy [name], male   DOB: July 15, 1935, 77 y.o.   MRN: [id number] 1 Day Post-Op  Subjective: Roy [name] is doing well with minimal discomfort.   His urine is clear on CBI. ROS: Negative except as above  Objective: Vital signs in last 24 hours: Temp:  [97.8 F (36.6 C)-99.2 F (37.3 C)] 98.4 F (36.9 C) (07/06 0500) Pulse Rate:  [87-116] 87 (07/06 0500) Resp:  [13-23] 16 (07/06 0500) BP: (121-226)/(53-155) 129/61 mmHg (07/06 0500) SpO2:  [95 %-100 %] 97 % (07/06 0500) Weight:  [97.1 kg (214 lb 1.1 oz)] 97.1 kg (214 lb 1.1 oz) (07/06 0500)  Intake/Output from previous day: 07/05 0701 - 07/06 0700 In: 6320 [P.O.:120; I.V.:1800; IV Piggyback:200] Out: 6000 [Urine:6000] Intake/Output this shift: Total I/O In: 360 [P.O.:360] Out: -   General appearance: alert and no distress Male genitalia: normal, urine clear in foley tubing.   Lab Results:   Recent Labs  09/26/12 1055 09/26/12 1109 09/27/12 0510  WBC 12.8*  --   --   HGB 14.2 14.6 14.9  HCT 40.6 43.0 44.2  PLT 180  --   --    BMET  Recent Labs  09/26/12 1109 09/27/12 0510  NA 136 135  K 3.9 4.4  CL 102 102  CO2  --  24  GLUCOSE 180* 149*  BUN 25* 22  CREATININE 1.80* 1.60*  CALCIUM  --  10.3   PT/INR No results found for this basename: LABPROT, INR,  in the last 72 hours ABG No results found for this basename: PHART, PCO2, PO2, HCO3,  in the last 72 hours  Studies/Results: No results found.  Anti-infectives: Anti-infectives   Start     Dose/Rate Route Frequency Ordered Stop   09/27/12 1400  cefTRIAXone (ROCEPHIN) 1 g in dextrose 5 % 50 mL IVPB     1 g 100 mL/hr over 30 Minutes Intravenous Every 24 hours 09/26/12 1730     09/26/12 1400  cefTRIAXone (ROCEPHIN) 1 g in dextrose 5 % 50 mL IVPB     1 g 100 mL/hr over 30 Minutes Intravenous  Once 09/26/12 1345 09/26/12 1447      Current Facility-Administered Medications  Medication Dose Route Frequency Provider Last Rate Last Dose  .  0.9 % NaCl with KCl 20 mEq/ L  infusion   Intravenous Continuous Alexis Frock, MD 75 mL/hr at 09/26/12 2035    . acetaminophen (OFIRMEV) IV 1,000 mg  1,000 mg Intravenous Q6H Alexis Frock, MD   1,000 mg at 09/27/12 0505  . albuterol (PROVENTIL HFA;VENTOLIN HFA) 108 (90 BASE) MCG/ACT inhaler 2 puff  2 puff Inhalation Q4H PRN Alexis Frock, MD      . cefTRIAXone (ROCEPHIN) 1 g in dextrose 5 % 50 mL IVPB  1 g Intravenous Q24H Alexis Frock, MD      . cyclobenzaprine (FLEXERIL) tablet 5 mg  5 mg Oral TID PRN Alexis Frock, MD      . docusate sodium (COLACE) capsule 100 mg  100 mg Oral BID Alexis Frock, MD   100 mg at 09/26/12 2209  . gabapentin (NEURONTIN) capsule 100 mg  100 mg Oral Daily Alexis Frock, MD   100 mg at 09/26/12 2208  . HYDROmorphone (DILAUDID) injection 0.5-1 mg  0.5-1 mg Intravenous Q2H PRN Alexis Frock, MD      . insulin aspart (novoLOG) injection 0-15 Units  0-15 Units Subcutaneous TID WC Alexis Frock, MD   2 Units at 09/27/12 [phone]  . insulin aspart (novoLOG) injection  0-5 Units  0-5 Units Subcutaneous QHS Alexis Frock, MD      . insulin aspart (novoLOG) injection 4 Units  4 Units Subcutaneous TID WC Alexis Frock, MD   4 Units at 09/27/12 [phone]  . loratadine (CLARITIN) tablet 10 mg  10 mg Oral Daily Alexis Frock, MD   10 mg at 09/26/12 2208  . ondansetron (ZOFRAN) injection 4 mg  4 mg Intravenous Q4H PRN Alexis Frock, MD      . oxybutynin (DITROPAN) tablet 5 mg  5 mg Oral Q8H PRN Alexis Frock, MD      . oxyCODONE (Oxy IR/ROXICODONE) immediate release tablet 5 mg  5 mg Oral Q4H PRN Alexis Frock, MD      . pantoprazole (PROTONIX) EC tablet 40 mg  40 mg Oral Daily PRN Alexis Frock, MD      . potassium chloride SA (K-DUR,KLOR-CON) CR tablet 20 mEq  20 mEq Oral Daily Alexis Frock, MD   20 mEq at 09/26/12 2209  . senna (SENOKOT) tablet 8.6 mg  1 tablet Oral BID Alexis Frock, MD   8.6 mg at 09/26/12 2208  . sodium chloride irrigation 0.9 % 3,000 mL  3,000 mL  Irrigation Continuous Alexis Frock, MD      . tamsulosin [facility]) capsule 0.4 mg  0.4 mg Oral Daily Alexis Frock, MD   0.4 mg at 09/26/12 2208  . torsemide (DEMADEX) tablet 100 mg  100 mg Oral Daily Alexis Frock, MD   100 mg at 09/26/12 2208   Facility-Administered Medications Ordered in Other Encounters  Medication Dose Route Frequency Provider Last Rate Last Dose  . hydrALAZINE (APRESOLINE) injection    Anesthesia Intra-op Marta Antu, CRNA   3 mg at 09/26/12 1915    Assessment: s/p Procedure(s): CYSTOSCOPY, clot evacuation,fulgeration of bladder, bladder biopsy, transurethreal vaporation of prostate  He is doing well with clear urine on CBI.   He has a stable Hgb.  The Cr is falling.   Plan: I will d/c the CBI and discontinue the foley in the morning if the urine remains clear.      LOS: 1 day    Donnetta Gillin J 09/27/2012

## 2012-09-28 ENCOUNTER — Encounter (HOSPITAL_COMMUNITY): Payer: Self-pay | Admitting: Urology

## 2012-09-28 LAB — URINE CULTURE

## 2012-09-28 LAB — GLUCOSE, CAPILLARY
Glucose-Capillary: 121 mg/dL — ABNORMAL HIGH (ref 70–99)
Glucose-Capillary: 173 mg/dL — ABNORMAL HIGH (ref 70–99)

## 2012-09-28 NOTE — Progress Notes (Signed)
Report received from lab with updated result of urine culture.  Pt already on antibiotic. Stacey Drain

## 2012-09-28 NOTE — Progress Notes (Signed)
2 Days Post-Op Subjective: Patient reports No pain.  Tolerates diet well.  Objective: Vital signs in last 24 hours: Temp:  [98.1 F (36.7 C)-98.6 F (37 C)] 98.6 F (37 C) (07/07 1516) Pulse Rate:  [93-100] 95 (07/07 1516) Resp:  [16-18] 18 (07/07 1516) BP: (147-181)/(80-93) 181/93 mmHg (07/07 1516) SpO2:  [94 %-100 %] 95 % (07/07 1516)  Intake/Output from previous day: 07/06 0701 - 07/07 0700 In: 2722.5 [P.O.:840; I.V.:1732.5; IV Piggyback:150] Out: [id number] [Urine:4725] Intake/Output this shift: Total I/O In: 916.3 [P.O.:240; I.V.:676.3] Out: 2500 [Urine:2500]  Physical Exam:  General: Alert and oriented.  In good spirits. Foley draining well.  Urine clearing up.   Lab Results:  Recent Labs  09/26/12 1055 09/26/12 1109 09/27/12 0510  HGB 14.2 14.6 14.9  HCT 40.6 43.0 44.2   BMET  Recent Labs  09/26/12 1109 09/27/12 0510  NA 136 135  K 3.9 4.4  CL 102 102  CO2  --  24  GLUCOSE 180* 149*  BUN 25* 22  CREATININE 1.80* 1.60*  CALCIUM  --  10.3   No results found for this basename: LABPT, INR,  in the last 72 hours No results found for this basename: LABURIN,  in the last 72 hours Results for orders placed during the hospital encounter of 09/26/12  URINE CULTURE     Status: None   Collection Time    09/26/12 12:22 PM      Result Value Range Status   Specimen Description URINE, CATHETERIZED   Final   Special Requests NONE   Final   Culture  Setup Time 09/26/2012 19:27   Final   Colony Count >=100,000 COLONIES/ML   Final   Culture     Final   Value: KLEBSIELLA PNEUMONIAE     Note: CORRECTED RESULTS CALLED TO: DANA DARK@1029  ON [id number] BY [facility]     Note: PREVIOUSLY REPORTED AS MULTIPLE SPECIES   Report Status 09/28/2012 FINAL   Final   Organism ID, Bacteria KLEBSIELLA PNEUMONIAE   Final    Studies/Results: No results found.  Assessment/Plan:  Gross hematuria  Radiation cystitis  Leave Foley in place.  Remove in AM  Probable discharge in AM if  voids well and urine is clear   LOS: 2 days   Roy [name] 09/28/2012, 6:17 PM

## 2012-09-28 NOTE — Progress Notes (Signed)
Patient urine is still red with sediment unable to D/C Foley . There is an order to D/C foley if urine is pink or clear. The patient is also requesting to see physician prior to removal of the foley. I will continue to monitor the patient. Roslyn Smiling, RN

## 2012-09-29 LAB — GLUCOSE, CAPILLARY: Glucose-Capillary: 165 mg/dL — ABNORMAL HIGH (ref 70–99)

## 2012-09-29 MED ORDER — CIPROFLOXACIN HCL 250 MG PO TABS: 250.0000 mg | ORAL_TABLET | Freq: Two times a day (BID) | ORAL | Status: DC

## 2012-09-29 NOTE — Progress Notes (Signed)
3 Days Post-Op Subjective: Patient reports Feels fine. No abdominal pain  Objective: Vital signs in last 24 hours: Temp:  [97.8 F (36.6 C)-98.6 F (37 C)] 98.2 F (36.8 C) (07/08 0530) Pulse Rate:  [92-107] 92 (07/08 0530) Resp:  [18] 18 (07/08 0530) BP: (129-181)/(68-93) 165/68 mmHg (07/08 0530) SpO2:  [95 %-99 %] 99 % (07/08 0530)  Intake/Output from previous day: 07/07 0701 - 07/08 0700 In: 2082.5 [P.O.:240; I.V.:1792.5; IV Piggyback:50] Out: 4750 [Urine:4750] Intake/Output this shift: Total I/O In: 120 [P.O.:120] Out: -   Physical Exam:  Foley removed about 3 hours ago.  Has voided small amount of pinkish urine.  Bladder not distended. Lab Results:  Recent Labs  09/26/12 1055 09/26/12 1109 09/27/12 0510  HGB 14.2 14.6 14.9  HCT 40.6 43.0 44.2   BMET  Recent Labs  09/26/12 1109 09/27/12 0510  NA 136 135  K 3.9 4.4  CL 102 102  CO2  --  24  GLUCOSE 180* 149*  BUN 25* 22  CREATININE 1.80* 1.60*  CALCIUM  --  10.3   No results found for this basename: LABPT, INR,  in the last 72 hours No results found for this basename: LABURIN,  in the last 72 hours Results for orders placed during the hospital encounter of 09/26/12  URINE CULTURE     Status: None   Collection Time    09/26/12 12:22 PM      Result Value Range Status   Specimen Description URINE, CATHETERIZED   Final   Special Requests NONE   Final   Culture  Setup Time 09/26/2012 19:27   Final   Colony Count >=100,000 COLONIES/ML   Final   Culture     Final   Value: KLEBSIELLA PNEUMONIAE     Note: CORRECTED RESULTS CALLED TO: DANA DARK@1029  ON [id number] BY [facility]     Note: PREVIOUSLY REPORTED AS MULTIPLE SPECIES   Report Status 09/28/2012 FINAL   Final   Organism ID, Bacteria KLEBSIELLA PNEUMONIAE   Final    Studies/Results: No results found.  Assessment/Plan:  Observe voiding.  If voids well, will discharge home today   LOS: 3 days   Roy [name] 09/29/2012, 10:16 AM

## 2012-09-29 NOTE — Discharge Summary (Signed)
Patient ID: UTAH WELDEN MRN: [id number] DOB/AGE: 1935-11-23 77 y.o.  Admit date: 09/26/2012 Discharge date: 09/29/2012  Admission Diagnoses: Hematuria [599.70]  Discharge Diagnoses: Gross hematuria.  Prostate cancer.  Diabetes. H/o bladder calculi.   Discharged Condition: Improved  Hospital Course: The patient is a 77 years old male with history of prostate cancer treated with IMRT in the past.  He also  had cystolitholapaxy.  On 7/4 he started having difficulty voiding.  He went to the ER and had a catheter that drained bloody urine.  Irrigation of the bladder was done but the urine was not clear.  He was taking to the OR by Dr Tresa Moore and had fulguration of 70% of the bladder and vaporization of the median lobe.  His urine gradually cleared up post operatively.  The Foley was removed this morning.  He has been voiding on his own.  Urine is pinkish.  Consults: None  Significant Diagnostic Studies: No results found.  Treatments: Cystoscopy, Fulguration of bleeders.  Transurethral vaporization of median lobe prostate.  Discharge Exam: Blood pressure 146/72, pulse 108, temperature 97.5 F (36.4 C), temperature source Oral, resp. rate 18, height 5' 10.5" (1.791 m), weight 97.1 kg (214 lb 1.1 oz), SpO2 97.00%. Abdomen: Soft, non distended, non tender.  Disposition: Discharge home   Future Appointments Provider Department Dept Phone   10/13/2012 9:15 AM Deneise Lever, MD [facility] [phone]       Medication List    STOP taking these medications       aspirin 81 MG tablet      TAKE these medications       amitriptyline 10 MG tablet  Commonly known as:  ELAVIL  Take 10 mg by mouth at bedtime as needed for sleep.     ciprofloxacin 250 MG tablet  Commonly known as:  CIPRO  Take 1 tablet (250 mg total) by mouth 2 (two) times daily.     cyclobenzaprine 5 MG tablet  Commonly known as:  FLEXERIL  Take 5 mg by mouth as needed for muscle spasms.     diltiazem  360 MG 24 hr capsule  Commonly known as:  TIAZAC  Take 360 mg by mouth daily as needed (High blood pressure Systolic>130).     gabapentin 100 MG capsule  Commonly known as:  NEURONTIN  Take 100 mg by mouth daily.     glimepiride 4 MG tablet  Commonly known as:  AMARYL  Take 4 mg by mouth daily as needed (High blood sugar >120).     lisinopril 40 MG tablet  Commonly known as:  PRINIVIL,ZESTRIL  Take 40 mg by mouth daily as needed (High blood pressure >130).     loratadine 10 MG tablet  Commonly known as:  CLARITIN  Take 10 mg by mouth daily.     pantoprazole 40 MG tablet  Commonly known as:  PROTONIX  Take 40 mg by mouth daily as needed (Acid reflux).     polyethylene glycol packet  Commonly known as:  MIRALAX / GLYCOLAX  Take 17 g by mouth as needed.     potassium chloride SA 20 MEQ tablet  Commonly known as:  K-DUR,KLOR-CON  Take 20 mEq by mouth daily.     PROAIR HFA 108 (90 BASE) MCG/ACT inhaler  Generic drug:  albuterol  Inhale 2 puffs into the lungs 4 (four) times daily as needed.     sitaGLIPtin 100 MG tablet  Commonly known as:  JANUVIA  Take 100 mg by  mouth daily as needed (High blood sugar >120).     tamsulosin 0.4 MG Caps  Commonly known as:  FLOMAX  Take 0.4 mg by mouth daily.     torsemide 100 MG tablet  Commonly known as:  DEMADEX  Take 100 mg by mouth daily.     vitamin C 1000 MG tablet  Take 1,000 mg by mouth daily.         Signed: Jaelee Laughter-HENRY 09/29/2012, 6:08 PM

## 2012-09-29 NOTE — Progress Notes (Signed)
Patient discharged home in stable condition.  No change from morning assessment.  Adequate urine output.  Discharge instructions and scripts given to patient and spouse with verbal understanding and feedback.  No further questions at this time.

## 2012-10-13 ENCOUNTER — Ambulatory Visit: Payer: Self-pay | Admitting: Internal Medicine

## 2012-11-06 ENCOUNTER — Ambulatory Visit (INDEPENDENT_AMBULATORY_CARE_PROVIDER_SITE_OTHER): Payer: Medicare Other | Admitting: Internal Medicine

## 2012-11-06 ENCOUNTER — Encounter: Payer: Self-pay | Admitting: Internal Medicine

## 2012-11-06 VITALS — BP 120/76 | HR 76 | Ht 70.0 in | Wt 224.2 lb

## 2012-11-06 DIAGNOSIS — E669 Obesity, unspecified: Secondary | ICD-10-CM

## 2012-11-06 DIAGNOSIS — G4733 Obstructive sleep apnea (adult) (pediatric): Secondary | ICD-10-CM

## 2012-11-06 DIAGNOSIS — J449 Chronic obstructive pulmonary disease, unspecified: Secondary | ICD-10-CM

## 2012-11-06 NOTE — Patient Instructions (Addendum)
We can continue BIPAP 7/4/ [facility]  Please call as needed

## 2012-11-06 NOTE — Progress Notes (Signed)
Subjective:    Patient ID: Roy [name], male    DOB: September 09, 1935, 77 y.o.   MRN: [id number]  HPI 10/09/10- 77 yoM followed for OSA, Asthma/ COPD    Wife here Last here March 13, 2010- note reviewed. He and his wife were unhappy with [employer] and are switching to [facility]. He has done well with BIPAP 17/4, with good control and compliance. He has not been able to lose weight.   10/14/11- 77 yoM followed for OSA, Asthma/ COPD    Wife here Pt states he is wearing BiPAP 7/4/ advanced nightly approx 8-9 hrs. Denies problems with pressure or mask. Pt states that [facility] has been "wonderful" since he switched companies! Adding a humidifier also made a big difference. Asthma symptoms have been well-controlled. No wheeze this summer and no need for his asthma medications.  11/06/12- 77 yoM former smoker followed for OSA, Asthma/ COPD    Wife here FOLLOWS FOR: wears BiPAP 7/4 / [facility] every night for about 8-9 hours; pressure working well for patient. He feels he is doing fine. Good compliance with no concerns.  Diet and has brought weight down. Denies wheezing or cough. His primary physician checks and occasional chest x-ray.  ROS-see HPI Constitutional:   + weight loss, night sweats, fevers, chills, fatigue, lassitude. HEENT:   No-  headaches, difficulty swallowing, tooth/dental problems, sore throat,       No-  sneezing, itching, ear ache, nasal congestion, post nasal drip,  CV:  No-   chest pain, orthopnea, PND, swelling in lower extremities, anasarca,  dizziness, palpitations Resp: No-   shortness of breath with exertion or at rest.              No-   productive cough,  No non-productive cough,  No- coughing up of blood.              No-   change in color of mucus.  No- wheezing.   Skin: No-   rash or lesions. GI:  No-   heartburn, indigestion, abdominal pain, nausea, vomiting,  GU:  MS:  No-   joint pain or swelling.   Neuro-     nothing unusual Psych:  No- change in mood or affect. No  depression or anxiety.  No memory loss.  Objective:   Physical Exam General- Alert, Oriented, Affect-appropriate, Distress- none acute,  obese Skin- rash-none, lesions- none, excoriation- none Lymphadenopathy- none Head- atraumatic            Eyes- Gross vision intact, PERRLA, conjunctivae clear secretions            Ears- Hearing, canals            Nose- Clear, No- Septal dev, mucus, polyps, erosion, perforation             Throat- Mallampati III , mucosa clear , drainage- none, tonsils- present.  Dental repair Neck- flexible , trachea midline, no stridor , thyroid nl, carotid no bruit Chest - symmetrical excursion , unlabored           Heart/CV- RRR , no murmur , no gallop  , no rub, nl s1 s2                           - JVD- none , edema- none, stasis changes- none, varices- none           Lung- clear to P&A, wheeze- none, cough- none , dullness-none, rub- none  Chest wall-  Abd-  Br/ Gen/ Rectal- Not done, not indicated Extrem- cyanosis- none, clubbing, none, atrophy- none, strength- nl Neuro- grossly intact to observation Assessment & Plan:

## 2012-11-25 NOTE — Assessment & Plan Note (Signed)
He is encouraged to keep his weight down.

## 2012-11-25 NOTE — Assessment & Plan Note (Signed)
Good compliance and control. He is satisfied and wants no changes made.

## 2012-11-25 NOTE — Assessment & Plan Note (Signed)
Good control, needing less medication recently.

## 2013-11-10 ENCOUNTER — Encounter: Payer: Self-pay | Admitting: Internal Medicine

## 2013-11-10 ENCOUNTER — Ambulatory Visit (INDEPENDENT_AMBULATORY_CARE_PROVIDER_SITE_OTHER): Payer: Medicare Other | Admitting: Internal Medicine

## 2013-11-10 VITALS — BP 122/64 | HR 71 | Ht 70.0 in | Wt 226.0 lb

## 2013-11-10 DIAGNOSIS — G4733 Obstructive sleep apnea (adult) (pediatric): Secondary | ICD-10-CM

## 2013-11-10 NOTE — Patient Instructions (Signed)
We can continue BIPAP 7/4  [facility]  Please call if we can help

## 2013-11-10 NOTE — Progress Notes (Signed)
Subjective:    Patient ID: Roy [name], male    DOB: [birth year], 78 y.o.   MRN: [id number]  HPI 10/09/10- 24 yoM followed for OSA, Asthma/ COPD    Wife here Last here March 13, 2010- note reviewed. He and his wife were unhappy with [facility] and are switching to [facility]. He has done well with BIPAP 17/4, with good control and compliance. He has not been able to lose weight.   10/14/11- 88 yoM followed for OSA, Asthma/ COPD    Wife here Pt states he is wearing BiPAP 7/4/ advanced nightly approx 8-9 hrs. Denies problems with pressure or mask. Pt states that [facility] has been "wonderful" since he switched companies! Adding a humidifier also made a big difference. Asthma symptoms have been well-controlled. No wheeze this summer and no need for his asthma medications.  11/06/12- 55 yoM former smoker followed for OSA, Asthma/ COPD    Wife here FOLLOWS FOR: wears BiPAP 7/4 / [facility] every night for about 8-9 hours; pressure working well for patient. He feels he is doing fine. Good compliance with no concerns.  Diet and has brought weight down. Denies wheezing or cough. His primary physician checks and occasional chest x-ray.  11/10/13- 6 yoM former smoker followed for OSA, Asthma/ COPD    Wife here FOLLOWS FOR: pt wearing BIPAP 7/4 8-9 hours every night, pt denies any problems with mask/pressure/supplies.  Pt has no other complaints at this time.     ROS-see HPI Constitutional:   + weight loss, night sweats, fevers, chills, fatigue, lassitude. HEENT:   No-  headaches, difficulty swallowing, tooth/dental problems, sore throat,       No-  sneezing, itching, ear ache, nasal congestion, post nasal drip,  CV:  No-   chest pain, orthopnea, PND, swelling in lower extremities, anasarca,  dizziness, palpitations Resp: No-   shortness of breath with exertion or at rest.              No-   productive cough,  No non-productive cough,  No- coughing up of blood.              No-   change in color of mucus.   No- wheezing.   Skin: No-   rash or lesions. GI:  No-   heartburn, indigestion, abdominal pain, nausea, vomiting,  GU:  MS:  No-   joint pain or swelling.   Neuro-     nothing unusual Psych:  No- change in mood or affect. No depression or anxiety.  No memory loss.  Objective:   Physical Exam General- Alert, Oriented, Affect-appropriate, Distress- none acute,  obese Skin- rash-none, lesions- none, excoriation- none Lymphadenopathy- none Head- atraumatic            Eyes- Gross vision intact, PERRLA, conjunctivae clear secretions            Ears- Hearing, canals            Nose- Clear, No- Septal dev, mucus, polyps, erosion, perforation             Throat- Mallampati III , mucosa clear , drainage- none, tonsils- present.  Dental repair Neck- flexible , trachea midline, no stridor , thyroid nl, carotid no bruit Chest - symmetrical excursion , unlabored           Heart/CV- RRR , no murmur , no gallop  , no rub, nl s1 s2                           -  JVD- none , edema- none, stasis changes- none, varices- none           Lung- clear to P&A, wheeze- none, cough- none , dullness-none, rub- none           Chest wall-  Abd-  Br/ Gen/ Rectal- Not done, not indicated Extrem- cyanosis- none, clubbing, none, atrophy- none, strength- nl Neuro- grossly intact to observation Assessment & Plan:

## 2014-06-02 ENCOUNTER — Other Ambulatory Visit: Payer: Self-pay | Admitting: Internal Medicine

## 2014-06-02 ENCOUNTER — Ambulatory Visit
Admission: RE | Admit: 2014-06-02 | Discharge: 2014-06-02 | Disposition: A | Discharge status: Home / Self Care | Payer: Medicare Other | Source: Ambulatory Visit | Attending: Internal Medicine | Admitting: Internal Medicine

## 2014-06-02 DIAGNOSIS — M542 Cervicalgia: Secondary | ICD-10-CM

## 2014-11-11 ENCOUNTER — Encounter: Payer: Self-pay | Admitting: Internal Medicine

## 2014-11-11 ENCOUNTER — Ambulatory Visit (INDEPENDENT_AMBULATORY_CARE_PROVIDER_SITE_OTHER): Payer: Medicare Other | Admitting: Internal Medicine

## 2014-11-11 VITALS — BP 150/62 | HR 65 | Ht 70.0 in | Wt 234.6 lb

## 2014-11-11 DIAGNOSIS — I10 Essential (primary) hypertension: Secondary | ICD-10-CM | POA: Diagnosis not present

## 2014-11-11 DIAGNOSIS — G4733 Obstructive sleep apnea (adult) (pediatric): Secondary | ICD-10-CM | POA: Diagnosis not present

## 2014-11-11 NOTE — Progress Notes (Signed)
Subjective:    Patient ID: Roy [name], male    DOB: December 22, 1935, 79 y.o.   MRN: [id number]  HPI 10/09/10- 38 yoM followed for OSA, Asthma/ COPD    Wife here Last here March 13, 2010- note reviewed. He and his wife were unhappy with [facility] and are switching to [facility]. He has done well with BIPAP 17/4, with good control and compliance. He has not been able to lose weight.   10/14/11- 69 yoM followed for OSA, Asthma/ COPD    Wife here Pt states he is wearing BiPAP 7/4/ advanced nightly approx 8-9 hrs. Denies problems with pressure or mask. Pt states that [facility] has been "wonderful" since he switched companies! Adding a humidifier also made a big difference. Asthma symptoms have been well-controlled. No wheeze this summer and no need for his asthma medications.  11/06/12- 62 yoM former smoker followed for OSA, Asthma/ COPD    Wife here FOLLOWS FOR: wears BiPAP 7/4 / [facility] every night for about 8-9 hours; pressure working well for patient. He feels he is doing fine. Good compliance with no concerns.  Diet and has brought weight down. Denies wheezing or cough. His primary physician checks and occasional chest x-ray.  11/10/13- 89 yoM former smoker followed for OSA, Asthma/ COPD    Wife here FOLLOWS FOR: pt wearing BIPAP 7/4 8-9 hours every night, pt denies any problems with mask/pressure/supplies.  Pt has no other complaints at this time.   11/11/14- 89 yoM former smoker followed for OSA, Asthma/ COPD    Wife here FOLLOWS FOR: Pt states he is wearing his BIPAP 7/4  [facility] nightly for about 8-9 hours. Pt denies issues with mask, pressure, or machine.  He describes good compliance and control with no concerns.  ROS-see HPI Constitutional:   + weight loss, night sweats, fevers, chills, fatigue, lassitude. HEENT:   No-  headaches, difficulty swallowing, tooth/dental problems, sore throat,       No-  sneezing, itching, ear ache, nasal congestion, post nasal drip,  CV:  No-   chest pain,  orthopnea, PND, swelling in lower extremities, anasarca,  dizziness, palpitations Resp: No-   shortness of breath with exertion or at rest.              No-   productive cough,  No non-productive cough,  No- coughing up of blood.              No-   change in color of mucus.  No- wheezing.   Skin: No-   rash or lesions. GI:  No-   heartburn, indigestion, abdominal pain, nausea, vomiting,  GU:  MS:  No-   joint pain or swelling.   Neuro-     nothing unusual Psych:  No- change in mood or affect. No depression or anxiety.  No memory loss.  Objective:   Physical Exam General- Alert, Oriented, Affect-appropriate, Distress- none acute,  obese Skin- rash-none, lesions- none, excoriation- none Lymphadenopathy- none Head- atraumatic            Eyes- Gross vision intact, PERRLA, conjunctivae clear secretions            Ears- Hearing, canals            Nose- Clear, No- Septal dev, mucus, polyps, erosion, perforation             Throat- Mallampati III , mucosa clear , drainage- none, tonsils- present.  + Dental repair Neck- flexible , trachea midline, no stridor , thyroid nl,  carotid no bruit Chest - symmetrical excursion , unlabored           Heart/CV- RRR , no murmur , no gallop  , no rub, nl s1 s2                           - JVD- none , edema- none, stasis changes- none, varices- none           Lung- clear to P&A, wheeze- none, cough- none , dullness-none, rub- none           Chest wall-  Abd-  Br/ Gen/ Rectal- Not done, not indicated Extrem- cyanosis- none, clubbing, none, atrophy- none, strength- nl Neuro- grossly intact to observation Assessment & Plan:

## 2014-11-11 NOTE — Patient Instructions (Signed)
We can continue BIPAP 7/4/ [facility]  Please call if we can help

## 2014-11-12 NOTE — Assessment & Plan Note (Signed)
Good compliance and control. He doesn't sleep without his machine. We discussed guidelines for placing an older unit when the time comes.

## 2014-11-12 NOTE — Assessment & Plan Note (Signed)
We reviewed the interaction between hypertension and obstructive sleep apnea

## 2015-07-24 DIAGNOSIS — N138 Other obstructive and reflux uropathy: Secondary | ICD-10-CM | POA: Insufficient documentation

## 2015-08-14 ENCOUNTER — Other Ambulatory Visit (HOSPITAL_COMMUNITY): Payer: Self-pay | Admitting: Internal Medicine

## 2015-08-14 ENCOUNTER — Ambulatory Visit (HOSPITAL_BASED_OUTPATIENT_CLINIC_OR_DEPARTMENT_OTHER)
Admission: RE | Admit: 2015-08-14 | Discharge: 2015-08-14 | Disposition: A | Discharge status: Home / Self Care | Payer: Medicare Other | Source: Ambulatory Visit | Attending: Emergency Medicine | Admitting: Emergency Medicine

## 2015-08-14 ENCOUNTER — Emergency Department (HOSPITAL_COMMUNITY)
Admission: EM | Admit: 2015-08-14 | Discharge: 2015-08-15 | Disposition: A | Discharge status: Home / Self Care | Payer: Medicare Other | Attending: Emergency Medicine | Admitting: Emergency Medicine

## 2015-08-14 ENCOUNTER — Encounter (HOSPITAL_COMMUNITY): Payer: Self-pay | Admitting: *Deleted

## 2015-08-14 DIAGNOSIS — M79605 Pain in left leg: Secondary | ICD-10-CM

## 2015-08-14 DIAGNOSIS — M7989 Other specified soft tissue disorders: Secondary | ICD-10-CM

## 2015-08-14 DIAGNOSIS — R936 Abnormal findings on diagnostic imaging of limbs: Secondary | ICD-10-CM

## 2015-08-14 DIAGNOSIS — J449 Chronic obstructive pulmonary disease, unspecified: Secondary | ICD-10-CM | POA: Diagnosis not present

## 2015-08-14 DIAGNOSIS — Z79899 Other long term (current) drug therapy: Secondary | ICD-10-CM | POA: Insufficient documentation

## 2015-08-14 DIAGNOSIS — Z87448 Personal history of other diseases of urinary system: Secondary | ICD-10-CM | POA: Insufficient documentation

## 2015-08-14 DIAGNOSIS — E119 Type 2 diabetes mellitus without complications: Secondary | ICD-10-CM | POA: Insufficient documentation

## 2015-08-14 DIAGNOSIS — G4733 Obstructive sleep apnea (adult) (pediatric): Secondary | ICD-10-CM | POA: Insufficient documentation

## 2015-08-14 DIAGNOSIS — Z8546 Personal history of malignant neoplasm of prostate: Secondary | ICD-10-CM | POA: Diagnosis not present

## 2015-08-14 DIAGNOSIS — Z9981 Dependence on supplemental oxygen: Secondary | ICD-10-CM | POA: Insufficient documentation

## 2015-08-14 DIAGNOSIS — C61 Malignant neoplasm of prostate: Secondary | ICD-10-CM

## 2015-08-14 DIAGNOSIS — Z87442 Personal history of urinary calculi: Secondary | ICD-10-CM | POA: Diagnosis not present

## 2015-08-14 DIAGNOSIS — Z8739 Personal history of other diseases of the musculoskeletal system and connective tissue: Secondary | ICD-10-CM | POA: Diagnosis not present

## 2015-08-14 DIAGNOSIS — K219 Gastro-esophageal reflux disease without esophagitis: Secondary | ICD-10-CM | POA: Diagnosis not present

## 2015-08-14 DIAGNOSIS — I82402 Acute embolism and thrombosis of unspecified deep veins of left lower extremity: Secondary | ICD-10-CM | POA: Insufficient documentation

## 2015-08-14 DIAGNOSIS — M79662 Pain in left lower leg: Secondary | ICD-10-CM

## 2015-08-14 DIAGNOSIS — I10 Essential (primary) hypertension: Secondary | ICD-10-CM | POA: Diagnosis not present

## 2015-08-14 DIAGNOSIS — Z87891 Personal history of nicotine dependence: Secondary | ICD-10-CM | POA: Diagnosis not present

## 2015-08-14 LAB — CBC WITH DIFFERENTIAL/PLATELET
BASOS ABS: 0 10*3/uL (ref 0.0–0.1)
BASOS PCT: 0 %
EOS PCT: 3 %
Eosinophils Absolute: 0.3 10*3/uL (ref 0.0–0.7)
HCT: 41.6 % (ref 39.0–52.0)
Hemoglobin: 13.8 g/dL (ref 13.0–17.0)
LYMPHS PCT: 23 %
Lymphs Abs: 2.4 10*3/uL (ref 0.7–4.0)
MCH: 30.7 pg (ref 26.0–34.0)
MCHC: 33.2 g/dL (ref 30.0–36.0)
MCV: 92.4 fL (ref 78.0–100.0)
Monocytes Absolute: 0.6 10*3/uL (ref 0.1–1.0)
Monocytes Relative: 6 %
NEUTROS ABS: 7.1 10*3/uL (ref 1.7–7.7)
Neutrophils Relative %: 68 %
PLATELETS: 195 10*3/uL (ref 150–400)
RBC: 4.5 MIL/uL (ref 4.22–5.81)
RDW: 14.1 % (ref 11.5–15.5)
WBC: 10.4 10*3/uL (ref 4.0–10.5)

## 2015-08-14 LAB — I-STAT CHEM 8, ED
BUN: 33 mg/dL — ABNORMAL HIGH (ref 6–20)
Calcium, Ion: 1.57 mmol/L — ABNORMAL HIGH (ref 1.13–1.30)
Chloride: 108 mmol/L (ref 101–111)
Creatinine, Ser: 1.9 mg/dL — ABNORMAL HIGH (ref 0.61–1.24)
GLUCOSE: 68 mg/dL (ref 65–99)
HEMATOCRIT: 44 % (ref 39.0–52.0)
HEMOGLOBIN: 15 g/dL (ref 13.0–17.0)
POTASSIUM: 4.1 mmol/L (ref 3.5–5.1)
Sodium: 143 mmol/L (ref 135–145)
TCO2: 24 mmol/L (ref 0–100)

## 2015-08-14 NOTE — ED Notes (Signed)
Pt reports left lower leg pain and swelling x 5 days. Went for outpatient vascular study today and was + for DVT. No acute distress noted at triage.

## 2015-08-14 NOTE — ED Provider Notes (Signed)
CSN: [id number]     Arrival date & time 08/14/15  1419 History  By signing my name below, I, Rowan Blase, attest that this documentation has been prepared under the direction and in the presence of Varney Biles, MD . Electronically Signed: Rowan Blase, Scribe. 08/15/2015. 12:04 AM.   Chief Complaint  Patient presents with  . DVT   The history is provided by the patient. No language interpreter was used.   HPI Comments:  MAKIYAH LUNDHOLM is a 80 y.o. male with PMHx of DM, HTN and COPD who presents to the Emergency Department complaining of left lower leg pain and swelling for the past 5 days. Pt reports associated calf pain when walking. Pt was evaluated by vascular today and was positive for DVT in his left leg. Study was ordered by pt's PCP Dr. Kevan Ny. Pt wears compression socks with mild relief of swelling. Pt has h/o prostate cancer; he is in remission and is not currently undergoing treatment. He notes intermittent streaks of blood in his stool secondary to radiation therapy. Denies current use of blood thinners, h/o brain bleeds, h/o of blood clots, recent long distance travel, recent surgery, chest pain, or shortness of breath.  Past Medical History  Diagnosis Date  . Diabetes mellitus   . Hypertension   . OSA on CPAP   . GERD (gastroesophageal reflux disease)   . History of prostate cancer 2010-- S/P  EXTERNAL RADIATION  . Hematuria   . Bladder calculi   . Hydronephrosis, left   . Dyspnea on exertion   . Arthritis HANDS/ THUMBS/ SHOULDERS  . Chronic obstructive asthma, unspecified FOLLOWED BY DR Tarri Fuller YOUNG  VISIT 10-14-2011 IN [facility]  . COPD (chronic obstructive pulmonary disease) [facility])    Past Surgical History  Procedure Laterality Date  . Ganglion cyst excision  1988  (APPROX)    LEFT KNEE  . Carpal tunnel release  05-28-2005    LEFT  . Inguinal hernia repair  1955  . Cystoscopy with litholapaxy  10/18/2011    Procedure: CYSTOSCOPY WITH LITHOLAPAXY;   Surgeon: Hanley Ben, MD;  Location: [place];  Service: Urology;  Laterality: N/A;  1 HOUR NO STENT H# [id number] CELL# [id number]  MEDICARE BCBS  . Cystoscopy N/A 09/26/2012    Procedure: CYSTOSCOPY, clot evacuation,fulgeration of bladder, bladder biopsy, transurethreal vaporation of prostat;  Surgeon: Alexis Frock, MD;  Location: [facility];  Service: Urology;  Laterality: N/A;   Family History  Problem Relation Age of Onset  . Diabetes Father    Social History  Substance Use Topics  . Smoking status: Former Smoker -- 0.50 packs/day for 25 years    Types: Cigarettes    Quit date: 03/25/1998  . Smokeless tobacco: Never Used  . Alcohol Use: Yes     Comment: OCCASIONAL BEER    Review of Systems 10 systems reviewed and all are negative for acute change except as noted in the HPI.   Allergies  Review of patient's allergies indicates no known allergies.  Home Medications   Prior to Admission medications   Medication Sig Start Date End Date Taking? Authorizing Provider  albuterol (PROAIR HFA) 108 (90 BASE) MCG/ACT inhaler Inhale 2 puffs into the lungs 4 (four) times daily as needed.     Historical Provider, MD  amitriptyline (ELAVIL) 10 MG tablet Take 10 mg by mouth at bedtime as needed for sleep.     Historical Provider, MD  Ascorbic Acid (VITAMIN C) 1000 MG tablet Take 1,000 mg by  mouth daily.     Historical Provider, MD  calcium carbonate (OS-CAL) 600 MG TABS tablet Take 600 mg by mouth daily with breakfast.    Historical Provider, MD  cyclobenzaprine (FLEXERIL) 5 MG tablet Take 5 mg by mouth as needed for muscle spasms.     Historical Provider, MD  diltiazem (TIAZAC) 360 MG 24 hr capsule Take 360 mg by mouth daily as needed (High blood pressure Systolic>130).    Historical Provider, MD  gabapentin (NEURONTIN) 100 MG capsule Take 100 mg by mouth daily.    Historical Provider, MD  glimepiride (AMARYL) 4 MG tablet Take 4 mg by mouth daily as needed (High blood  sugar >120).     Historical Provider, MD  lisinopril (PRINIVIL,ZESTRIL) 40 MG tablet Take 40 mg by mouth daily as needed (High blood pressure >130).     Historical Provider, MD  loratadine (CLARITIN) 10 MG tablet Take 10 mg by mouth daily.    Historical Provider, MD  pantoprazole (PROTONIX) 40 MG tablet Take 40 mg by mouth daily as needed (Acid reflux).     Historical Provider, MD  polyethylene glycol (MIRALAX / GLYCOLAX) packet Take 17 g by mouth as needed.     Historical Provider, MD  potassium chloride SA (K-DUR,KLOR-CON) 20 MEQ tablet Take 20 mEq by mouth daily.     Historical Provider, MD  QUEtiapine (SEROQUEL) 25 MG tablet Take 25 mg by mouth at bedtime.    Historical Provider, MD  Rivaroxaban 15 & 20 MG TBPK Take as directed on package: Start with one 15mg  tablet by mouth twice a day with food. On Day 22, switch to one 20mg  tablet once a day with food. 08/15/15   Varney Biles, MD  sitaGLIPtin (JANUVIA) 100 MG tablet Take 100 mg by mouth daily as needed (High blood sugar >120).     Historical Provider, MD  tamsulosin (FLOMAX) 0.4 MG CAPS Take 0.4 mg by mouth daily.    Historical Provider, MD  torsemide (DEMADEX) 100 MG tablet Take 100 mg by mouth daily.     Historical Provider, MD   BP 150/79 mmHg  Pulse 89  Temp(Src) 97.9 F (36.6 C) (Oral)  Resp 20  Ht 5\' 10"  (1.778 m)  Wt 234 lb 9.1 oz (106.4 kg)  BMI 33.66 kg/m2  SpO2 98%   Physical Exam  Constitutional: He is oriented to person, place, and time. He appears well-developed and well-nourished.  HENT:  Head: Normocephalic.  Eyes: EOM are normal.  Neck: Normal range of motion.  Pulmonary/Chest: Effort normal.  Abdominal: He exhibits no distension.  Musculoskeletal: Normal range of motion.  LLE unilateral swelling; no TTP over calf area; no erythema  Neurological: He is alert and oriented to person, place, and time.  Psychiatric: He has a normal mood and affect.  Nursing note and vitals reviewed.   ED Course  Procedures   DIAGNOSTIC STUDIES:  Oxygen Saturation is 100% on RA, nomal by my interpretation.    COORDINATION OF CARE:  12:01 AM Will start pt on blood thinner. Discussed treatment plan with pt at bedside and pt agreed to plan.  Labs Review Labs Reviewed  BASIC METABOLIC PANEL - Abnormal; Notable for the following:    BUN 31 (*)    Creatinine, Ser 1.95 (*)    Calcium 12.1 (*)    GFR calc non Af Amer 31 (*)    GFR calc Af Amer 36 (*)    All other components within normal limits  I-STAT CHEM 8, ED -  Abnormal; Notable for the following:    BUN 33 (*)    Creatinine, Ser 1.90 (*)    Calcium, Ion 1.57 (*)    All other components within normal limits  CBC WITH DIFFERENTIAL/PLATELET    Imaging Review No results found. I have personally reviewed and evaluated these images and lab results as part of my medical decision-making.   EKG Interpretation None      MDM   Final diagnoses:  Acute DVT (deep venous thrombosis), left    I personally performed the services described in this documentation, which was scribed in my presence. The recorded information has been reviewed and is accurate.  Pt comes in w/ cc of leg swelling. He had an outpatient DVT study, which is +. We will start xarelto. Xarelto education done by me. Pt to see PCP in 1 week.     Varney Biles, MD 08/15/15 [phone]

## 2015-08-14 NOTE — Progress Notes (Signed)
VASCULAR LAB PRELIMINARY  PRELIMINARY  PRELIMINARY  PRELIMINARY  Bilateral lower extremity venous duplex completed.    Preliminary report:  Bilateral venous duplex was completed per protocol for patients with DVT of the requested limb. Right - No evidence of DVT, superficial thrombus or Baker's cyst. Left - Positive for an acute DVT of the femoral and popliteal vein. Also noted is a superficial thrombus of a calf vein. No evidence of a Baker's cyst  Roy [name], RVS 08/14/2015, 1:44 PM

## 2015-08-15 LAB — BASIC METABOLIC PANEL
Anion gap: 5 (ref 5–15)
BUN: 31 mg/dL — ABNORMAL HIGH (ref 6–20)
CALCIUM: 12.1 mg/dL — AB (ref 8.9–10.3)
CHLORIDE: 110 mmol/L (ref 101–111)
CO2: 26 mmol/L (ref 22–32)
CREATININE: 1.95 mg/dL — AB (ref 0.61–1.24)
GFR calc non Af Amer: 31 mL/min — ABNORMAL LOW (ref >60)
GFR, EST AFRICAN AMERICAN: 36 mL/min — AB (ref >60)
GLUCOSE: 74 mg/dL (ref 65–99)
Potassium: 4.3 mmol/L (ref 3.5–5.1)
Sodium: 141 mmol/L (ref 135–145)

## 2015-08-15 MED ORDER — RIVAROXABAN (XARELTO) VTE STARTER PACK (15 & 20 MG)
ORAL_TABLET | ORAL | Status: DC
Start: 2015-08-15 — End: 2015-10-07

## 2015-08-15 MED ORDER — RIVAROXABAN 15 MG PO TABS
15.0000 mg | ORAL_TABLET | Freq: Two times a day (BID) | ORAL | Status: DC
  Administered 2015-08-15: 15 mg via ORAL
  Filled 2015-08-15 (×2): qty 1

## 2015-08-15 NOTE — Progress Notes (Signed)
Pt counseled on Xarelto for DVT including how to take, side effects, drug interations, and when to report signs of bleeding.  Given education material.  Recommend Xarelto 15mg  PO BID x21d followed by 20mg  daily.   Wynona Neat, PharmD, BCPS 08/15/2015 12:22 AM

## 2015-08-15 NOTE — Discharge Instructions (Signed)
Deep Vein Thrombosis °A deep vein thrombosis (DVT) is a blood clot (thrombus) that usually occurs in a deep, larger vein of the lower leg or the pelvis, or in an upper extremity such as the arm. These are dangerous and can lead to serious and even life-threatening complications if the clot travels to the lungs. °A DVT can damage the valves in your leg veins so that instead of flowing upward, the blood pools in the lower leg. This is called post-thrombotic syndrome, and it can result in pain, swelling, discoloration, and sores on the leg. °CAUSES °A DVT is caused by the formation of a blood clot in your leg, pelvis, or arm. Usually, several things contribute to the formation of blood clots. A clot may develop when: °· Your blood flow slows down. °· Your vein becomes damaged in some way. °· You have a condition that makes your blood clot more easily. °RISK FACTORS °A DVT is more likely to develop in: °· People who are older, especially over 60 years of age. °· People who are overweight (obese). °· People who sit or lie still for a long time, such as during long-distance travel (over 4 hours), bed rest, hospitalization, or during recovery from certain medical conditions like a stroke. °· People who do not engage in much physical activity (sedentary lifestyle). °· People who have chronic breathing disorders. °· People who have a personal or family history of blood clots or blood clotting disease. °· People who have peripheral vascular disease (PVD), diabetes, or some types of cancer. °· People who have heart disease, especially if the person had a recent heart attack or has congestive heart failure. °· People who have neurological diseases that affect the legs (leg paresis). °· People who have had a traumatic injury, such as breaking a hip or leg. °· People who have recently had major or lengthy surgery, especially on the hip, knee, or abdomen. °· People who have had a central line placed inside a large vein. °· People  who take medicines that contain the hormone estrogen. These include birth control pills and hormone replacement therapy. °· Pregnancy or during childbirth or the postpartum period. °· Long plane flights (over 8 hours). °SIGNS AND SYMPTOMS °Symptoms of a DVT can include:  °· Swelling of your leg or arm, especially if one side is much worse. °· Warmth and redness of your leg or arm, especially if one side is much worse. °· Pain in your arm or leg. If the clot is in your leg, symptoms may be more noticeable or worse when you stand or walk. °· A feeling of pins and needles, if the clot is in the arm. °The symptoms of a DVT that has traveled to the lungs (pulmonary embolism, PE) usually start suddenly and include: °· Shortness of breath while active or at rest. °· Coughing or coughing up blood or blood-tinged mucus. °· Chest pain that is often worse with deep breaths. °· Rapid or irregular heartbeat. °· Feeling light-headed or dizzy. °· Fainting. °· Feeling anxious. °· Sweating. °There may also be pain and swelling in a leg if that is where the blood clot started. °These symptoms may represent a serious problem that is an emergency. Do not wait to see if the symptoms will go away. Get medical help right away. Call your local emergency services (911 in the [place] Do not drive yourself to the hospital. °DIAGNOSIS °Your health care provider will take a medical history and perform a physical exam. You may also   have other tests, including: °· Blood tests to assess the clotting properties of your blood. °· Imaging tests, such as CT, ultrasound, MRI, X-ray, and other tests to see if you have clots anywhere in your body. °TREATMENT °After a DVT is identified, it can be treated. The type of treatment that you receive depends on many factors, such as the cause of your DVT, your risk for bleeding or developing more clots, and other medical conditions that you have. Sometimes, a combination of treatments is necessary. Treatment  options may be combined and include: °· Monitoring the blood clot with ultrasound. °· Taking medicines by mouth, such as newer blood thinners (anticoagulants), thrombolytics, or warfarin. °· Taking anticoagulant medicine by injection or through an IV tube. °· Wearing compression stockings or using different types of devices. °· Surgery (rare) to remove the blood clot or to place a filter in your abdomen to stop the blood clot from traveling to your lungs. °Treatments for a DVT are often divided into immediate treatment and long-term treatment (up to 3 months after DVT). You can work with your health care provider to choose the treatment program that is best for you. °HOME CARE INSTRUCTIONS °If you are taking a newer oral anticoagulant: °· Take the medicine every single day at the same time each day. °· Understand what foods and drugs interact with this medicine. °· Understand that there are no regular blood tests required when using this medicine. °· Understand the side effects of this medicine, including excessive bruising or bleeding. Ask your health care provider or pharmacist about other possible side effects. °If you are taking warfarin: °· Understand how to take warfarin and know which foods can affect how warfarin works in your body. °· Understand that it is dangerous to take too much or too little warfarin. Too much warfarin increases the risk of bleeding. Too little warfarin continues to allow the risk for blood clots. °· Follow your PT and INR blood testing schedule. The PT and INR results allow your health care provider to adjust your dose of warfarin. It is very important that you have your PT and INR tested as often as told by your health care provider. °· Avoid major changes in your diet, or tell your health care provider before you change your diet. Arrange a visit with a registered dietitian to answer your questions. Many foods, especially foods that are high in vitamin K, can interfere with warfarin  and affect the PT and INR results. Eat a consistent amount of foods that are high in vitamin K, such as: °¨ Spinach, kale, broccoli, cabbage, collard greens, turnip greens, Brussels sprouts, peas, cauliflower, seaweed, and parsley. °¨ Beef liver and pork liver. °¨ Green tea. °¨ Soybean oil. °· Tell your health care provider about any and all medicines, vitamins, and supplements that you take, including aspirin and other over-the-counter anti-inflammatory medicines. Be especially cautious with aspirin and anti-inflammatory medicines. Do not take those before you ask your health care provider if it is safe to do so. This is important because many medicines can interfere with warfarin and affect the PT and INR results. °· Do not start or stop taking any over-the-counter or prescription medicine unless your health care provider or pharmacist tells you to do so. °If you take warfarin, you will also need to do these things: °· Hold pressure over cuts for longer than usual. °· Tell your dentist and other health care providers that you are taking warfarin before you have any procedures in which   bleeding may occur. °· Avoid alcohol or drink very small amounts. Tell your health care provider if you change your alcohol intake. °· Do not use tobacco products, including cigarettes, chewing tobacco, and e-cigarettes. If you need help quitting, ask your health care provider. °· Avoid contact sports. °General Instructions °· Take over-the-counter and prescription medicines only as told by your health care provider. Anticoagulant medicines can have side effects, including easy bruising and difficulty stopping bleeding. If you are prescribed an anticoagulant, you will also need to do these things: °¨ Hold pressure over cuts for longer than usual. °¨ Tell your dentist and other health care providers that you are taking anticoagulants before you have any procedures in which bleeding may occur. °¨ Avoid contact sports. °· Wear a medical  alert bracelet or carry a medical alert card that says you have had a PE. °· Ask your health care provider how soon you can go back to your normal activities. Stay active to prevent new blood clots from forming. °· Make sure to exercise while traveling or when you have been sitting or standing for a long period of time. It is very important to exercise. Exercise your legs by walking or by tightening and relaxing your leg muscles often. Take frequent walks. °· Wear compression stockings as told by your health care provider to help prevent more blood clots from forming. °· Do not use tobacco products, including cigarettes, chewing tobacco, and e-cigarettes. If you need help quitting, ask your health care provider. °· Keep all follow-up appointments with your health care provider. This is important. °PREVENTION °Take these actions to decrease your risk of developing another DVT: °· Exercise regularly. For at least 30 minutes every day, engage in: °¨ Activity that involves moving your arms and legs. °¨ Activity that encourages good blood flow through your body by increasing your heart rate. °· Exercise your arms and legs every hour during long-distance travel (over 4 hours). Drink plenty of water and avoid drinking alcohol while traveling. °· Avoid sitting or lying in bed for long periods of time without moving your legs. °· Maintain a weight that is appropriate for your height. Ask your health care provider what weight is healthy for you. °· If you are a woman who is over 35 years of age, avoid unnecessary use of medicines that contain estrogen. These include birth control pills. °· Do not smoke, especially if you take estrogen medicines. If you need help quitting, ask your health care provider. °If you are hospitalized, prevention measures may include: °· Early walking after surgery, as soon as your health care provider says that it is safe. °· Receiving anticoagulants to prevent blood clots. If you cannot take  anticoagulants, other options may be available, such as wearing compression stockings or using different types of devices. °SEEK IMMEDIATE MEDICAL CARE IF: °· You have new or increased pain, swelling, or redness in an arm or leg. °· You have numbness or tingling in an arm or leg. °· You have shortness of breath while active or at rest. °· You have chest pain. °· You have a rapid or irregular heartbeat. °· You feel light-headed or dizzy. °· You cough up blood. °· You notice blood in your vomit, bowel movement, or urine. °These symptoms may represent a serious problem that is an emergency. Do not wait to see if the symptoms will go away. Get medical help right away. Call your local emergency services (911 in the [place] Do not drive yourself to the hospital. °  °  This information is not intended to replace advice given to you by your health care provider. Make sure you discuss any questions you have with your health care provider.   Document Released: 03/11/2005 Document Revised: 11/30/2014 Document Reviewed: 07/06/2014 [employer] Interactive Patient Education 2016 [employer] Rivaroxaban oral tablets What is this medicine? RIVAROXABAN (ri va ROX a ban) is an anticoagulant (blood thinner). It is used to treat blood clots in the lungs or in the veins. It is also used after knee or hip surgeries to prevent blood clots. It is also used to lower the chance of stroke in people with a medical condition called atrial fibrillation. This medicine may be used for other purposes; ask your health care provider or pharmacist if you have questions. What should I tell my health care provider before I take this medicine? They need to know if you have any of these conditions: -bleeding disorders -bleeding in the brain -blood in your stools (black or tarry stools) or if you have blood in your vomit -history of stomach bleeding -kidney disease -liver disease -low blood counts, like low white cell, platelet, or red cell  counts -recent or planned spinal or epidural procedure -take medicines that treat or prevent blood clots -an unusual or allergic reaction to rivaroxaban, other medicines, foods, dyes, or preservatives -pregnant or trying to get pregnant -breast-feeding How should I use this medicine? Take this medicine by mouth with a glass of water. Follow the directions on the prescription label. Take your medicine at regular intervals. Do not take it more often than directed. Do not stop taking except on your doctor's advice. Stopping this medicine may increase your risk of a blood clot. Be sure to refill your prescription before you run out of medicine. If you are taking this medicine after hip or knee replacement surgery, take it with or without food. If you are taking this medicine for atrial fibrillation, take it with your evening meal. If you are taking this medicine to treat blood clots, take it with food at the same time each day. If you are unable to swallow your tablet, you may crush the tablet and mix it in applesauce. Then, immediately eat the applesauce. You should eat more food right after you eat the applesauce containing the crushed tablet. Talk to your pediatrician regarding the use of this medicine in children. Special care may be needed. Overdosage: If you think you have taken too much of this medicine contact a poison control center or emergency room at once. NOTE: This medicine is only for you. Do not share this medicine with others. What if I miss a dose? If you take your medicine once a day and miss a dose, take the missed dose as soon as you remember. If you take your medicine twice a day and miss a dose, take the missed dose immediately. In this instance, 2 tablets may be taken at the same time. The next day you should take 1 tablet twice a day as directed. What may interact with this medicine? -aspirin and aspirin-like medicines -certain antibiotics like erythromycin, azithromycin, and  clarithromycin -certain medicines for fungal infections like ketoconazole and itraconazole -certain medicines for irregular heart beat like amiodarone, quinidine, dronedarone -certain medicines for seizures like carbamazepine, phenytoin -certain medicines that treat or prevent blood clots like warfarin, enoxaparin, and dalteparin -conivaptan -diltiazem -felodipine -indinavir -lopinavir; ritonavir -NSAIDS, medicines for pain and inflammation, like ibuprofen or naproxen -ranolazine -rifampin -ritonavir -St. John's wort -verapamil This list may not describe all  possible interactions. Give your health care provider a list of all the medicines, herbs, non-prescription drugs, or dietary supplements you use. Also tell them if you smoke, drink alcohol, or use illegal drugs. Some items may interact with your medicine. What should I watch for while using this medicine? Visit your doctor or health care professional for regular checks on your progress. Your condition will be monitored carefully while you are receiving this medicine. Notify your doctor or health care professional and seek emergency treatment if you develop breathing problems; changes in vision; chest pain; severe, sudden headache; pain, swelling, warmth in the leg; trouble speaking; sudden numbness or weakness of the face, arm, or leg. These can be signs that your condition has gotten worse. If you are going to have surgery, tell your doctor or health care professional that you are taking this medicine. Tell your health care professional that you use this medicine before you have a spinal or epidural procedure. Sometimes people who take this medicine have bleeding problems around the spine when they have a spinal or epidural procedure. This bleeding is very rare. If you have a spinal or epidural procedure while on this medicine, call your health care professional immediately if you have back pain, numbness or tingling (especially in your  legs and feet), muscle weakness, paralysis, or loss of bladder or bowel control. Avoid sports and activities that might cause injury while you are using this medicine. Severe falls or injuries can cause unseen bleeding. Be careful when using sharp tools or knives. Consider using an Copy. Take special care brushing or flossing your teeth. Report any injuries, bruising, or red spots on the skin to your doctor or health care professional. What side effects may I notice from receiving this medicine? Side effects that you should report to your doctor or health care professional as soon as possible: -allergic reactions like skin rash, itching or hives, swelling of the face, lips, or tongue -back pain -redness, blistering, peeling or loosening of the skin, including inside the mouth -signs and symptoms of bleeding such as bloody or black, tarry stools; red or dark-brown urine; spitting up blood or brown material that looks like coffee grounds; red spots on the skin; unusual bruising or bleeding from the eye, gums, or nose Side effects that usually do not require medical attention (Report these to your doctor or health care professional if they continue or are bothersome.): -dizziness -muscle pain This list may not describe all possible side effects. Call your doctor for medical advice about side effects. You may report side effects to FDA at 1-800-FDA-1088. Where should I keep my medicine? Keep out of the reach of children. Store at room temperature between 15 and 30 degrees C (59 and 86 degrees F). Throw away any unused medicine after the expiration date. NOTE: This sheet is a summary. It may not cover all possible information. If you have questions about this medicine, talk to your doctor, pharmacist, or health care provider.    2016, [employer]. (2014-03-09 12:45:34)

## 2015-10-06 ENCOUNTER — Emergency Department (HOSPITAL_COMMUNITY): Payer: Medicare Other

## 2015-10-06 ENCOUNTER — Encounter (HOSPITAL_COMMUNITY): Payer: Self-pay | Admitting: *Deleted

## 2015-10-06 ENCOUNTER — Inpatient Hospital Stay (HOSPITAL_COMMUNITY)
Admission: EM | Admit: 2015-10-06 | Discharge: 2015-10-18 | DRG: 871 | Disposition: A | Discharge status: Home Health | Payer: Medicare Other | Attending: Internal Medicine | Admitting: Internal Medicine

## 2015-10-06 ENCOUNTER — Inpatient Hospital Stay (HOSPITAL_COMMUNITY): Payer: Medicare Other

## 2015-10-06 DIAGNOSIS — Z87891 Personal history of nicotine dependence: Secondary | ICD-10-CM

## 2015-10-06 DIAGNOSIS — R0600 Dyspnea, unspecified: Secondary | ICD-10-CM

## 2015-10-06 DIAGNOSIS — N3081 Other cystitis with hematuria: Secondary | ICD-10-CM | POA: Diagnosis present

## 2015-10-06 DIAGNOSIS — N1330 Unspecified hydronephrosis: Secondary | ICD-10-CM | POA: Diagnosis present

## 2015-10-06 DIAGNOSIS — T8383XA Hemorrhage of genitourinary prosthetic devices, implants and grafts, initial encounter: Secondary | ICD-10-CM | POA: Diagnosis present

## 2015-10-06 DIAGNOSIS — K5900 Constipation, unspecified: Secondary | ICD-10-CM | POA: Diagnosis present

## 2015-10-06 DIAGNOSIS — N390 Urinary tract infection, site not specified: Secondary | ICD-10-CM | POA: Diagnosis present

## 2015-10-06 DIAGNOSIS — E1122 Type 2 diabetes mellitus with diabetic chronic kidney disease: Secondary | ICD-10-CM | POA: Diagnosis present

## 2015-10-06 DIAGNOSIS — R296 Repeated falls: Secondary | ICD-10-CM | POA: Diagnosis present

## 2015-10-06 DIAGNOSIS — N179 Acute kidney failure, unspecified: Secondary | ICD-10-CM | POA: Diagnosis present

## 2015-10-06 DIAGNOSIS — Y846 Urinary catheterization as the cause of abnormal reaction of the patient, or of later complication, without mention of misadventure at the time of the procedure: Secondary | ICD-10-CM | POA: Diagnosis present

## 2015-10-06 DIAGNOSIS — N320 Bladder-neck obstruction: Secondary | ICD-10-CM | POA: Diagnosis present

## 2015-10-06 DIAGNOSIS — Y842 Radiological procedure and radiotherapy as the cause of abnormal reaction of the patient, or of later complication, without mention of misadventure at the time of the procedure: Secondary | ICD-10-CM | POA: Diagnosis present

## 2015-10-06 DIAGNOSIS — B952 Enterococcus as the cause of diseases classified elsewhere: Secondary | ICD-10-CM | POA: Diagnosis not present

## 2015-10-06 DIAGNOSIS — Z833 Family history of diabetes mellitus: Secondary | ICD-10-CM

## 2015-10-06 DIAGNOSIS — Z8546 Personal history of malignant neoplasm of prostate: Secondary | ICD-10-CM | POA: Diagnosis not present

## 2015-10-06 DIAGNOSIS — K219 Gastro-esophageal reflux disease without esophagitis: Secondary | ICD-10-CM | POA: Diagnosis present

## 2015-10-06 DIAGNOSIS — N210 Calculus in bladder: Secondary | ICD-10-CM | POA: Diagnosis present

## 2015-10-06 DIAGNOSIS — A419 Sepsis, unspecified organism: Secondary | ICD-10-CM

## 2015-10-06 DIAGNOSIS — G4733 Obstructive sleep apnea (adult) (pediatric): Secondary | ICD-10-CM | POA: Diagnosis present

## 2015-10-06 DIAGNOSIS — N184 Chronic kidney disease, stage 4 (severe): Secondary | ICD-10-CM | POA: Diagnosis present

## 2015-10-06 DIAGNOSIS — J449 Chronic obstructive pulmonary disease, unspecified: Secondary | ICD-10-CM | POA: Diagnosis present

## 2015-10-06 DIAGNOSIS — G92 Toxic encephalopathy: Secondary | ICD-10-CM | POA: Diagnosis present

## 2015-10-06 DIAGNOSIS — I4891 Unspecified atrial fibrillation: Secondary | ICD-10-CM | POA: Diagnosis present

## 2015-10-06 DIAGNOSIS — R6520 Severe sepsis without septic shock: Secondary | ICD-10-CM | POA: Diagnosis not present

## 2015-10-06 DIAGNOSIS — Z9989 Dependence on other enabling machines and devices: Secondary | ICD-10-CM | POA: Diagnosis present

## 2015-10-06 DIAGNOSIS — I129 Hypertensive chronic kidney disease with stage 1 through stage 4 chronic kidney disease, or unspecified chronic kidney disease: Secondary | ICD-10-CM | POA: Diagnosis present

## 2015-10-06 DIAGNOSIS — I82409 Acute embolism and thrombosis of unspecified deep veins of unspecified lower extremity: Secondary | ICD-10-CM

## 2015-10-06 DIAGNOSIS — E875 Hyperkalemia: Secondary | ICD-10-CM | POA: Diagnosis present

## 2015-10-06 DIAGNOSIS — R7881 Bacteremia: Secondary | ICD-10-CM | POA: Diagnosis not present

## 2015-10-06 DIAGNOSIS — I10 Essential (primary) hypertension: Secondary | ICD-10-CM | POA: Diagnosis present

## 2015-10-06 DIAGNOSIS — N183 Chronic kidney disease, stage 3 unspecified: Secondary | ICD-10-CM

## 2015-10-06 DIAGNOSIS — A4181 Sepsis due to Enterococcus: Secondary | ICD-10-CM | POA: Diagnosis present

## 2015-10-06 DIAGNOSIS — N3041 Irradiation cystitis with hematuria: Secondary | ICD-10-CM | POA: Diagnosis present

## 2015-10-06 DIAGNOSIS — D649 Anemia, unspecified: Secondary | ICD-10-CM | POA: Diagnosis present

## 2015-10-06 DIAGNOSIS — I480 Paroxysmal atrial fibrillation: Secondary | ICD-10-CM | POA: Diagnosis present

## 2015-10-06 DIAGNOSIS — W19XXXA Unspecified fall, initial encounter: Secondary | ICD-10-CM

## 2015-10-06 DIAGNOSIS — Z9079 Acquired absence of other genital organ(s): Secondary | ICD-10-CM

## 2015-10-06 DIAGNOSIS — I82412 Acute embolism and thrombosis of left femoral vein: Secondary | ICD-10-CM

## 2015-10-06 DIAGNOSIS — Z923 Personal history of irradiation: Secondary | ICD-10-CM

## 2015-10-06 DIAGNOSIS — E119 Type 2 diabetes mellitus without complications: Secondary | ICD-10-CM

## 2015-10-06 HISTORY — DX: Urinary tract infection, site not specified: N39.0

## 2015-10-06 HISTORY — DX: Sepsis, unspecified organism: A41.9

## 2015-10-06 LAB — URINALYSIS, ROUTINE W REFLEX MICROSCOPIC
Bilirubin Urine: NEGATIVE
Glucose, UA: NEGATIVE mg/dL
Ketones, ur: NEGATIVE mg/dL
Nitrite: POSITIVE — AB
Protein, ur: >300 mg/dL — AB
Specific Gravity, Urine: 1.018 (ref 1.005–1.030)
pH: 5.5 (ref 5.0–8.0)

## 2015-10-06 LAB — CBC WITH DIFFERENTIAL/PLATELET
Basophils Absolute: 0 10*3/uL (ref 0.0–0.1)
Basophils Relative: 0 %
Eosinophils Absolute: 0 10*3/uL (ref 0.0–0.7)
Eosinophils Relative: 0 %
HCT: 43.1 % (ref 39.0–52.0)
Hemoglobin: 14.8 g/dL (ref 13.0–17.0)
Lymphocytes Relative: 3 %
Lymphs Abs: 0.6 10*3/uL — ABNORMAL LOW (ref 0.7–4.0)
MCH: 31.4 pg (ref 26.0–34.0)
MCHC: 34.3 g/dL (ref 30.0–36.0)
MCV: 91.3 fL (ref 78.0–100.0)
Monocytes Absolute: 0.8 10*3/uL (ref 0.1–1.0)
Monocytes Relative: 4 %
Neutro Abs: 17.8 10*3/uL — ABNORMAL HIGH (ref 1.7–7.7)
Neutrophils Relative %: 93 %
Platelets: 225 10*3/uL (ref 150–400)
RBC: 4.72 MIL/uL (ref 4.22–5.81)
RDW: 13.9 % (ref 11.5–15.5)
WBC: 19.1 10*3/uL — ABNORMAL HIGH (ref 4.0–10.5)

## 2015-10-06 LAB — BASIC METABOLIC PANEL
Anion gap: 9 (ref 5–15)
BUN: 49 mg/dL — ABNORMAL HIGH (ref 6–20)
CO2: 23 mmol/L (ref 22–32)
Calcium: 10.8 mg/dL — ABNORMAL HIGH (ref 8.9–10.3)
Chloride: 104 mmol/L (ref 101–111)
Creatinine, Ser: 2.72 mg/dL — ABNORMAL HIGH (ref 0.61–1.24)
GFR calc Af Amer: 24 mL/min — ABNORMAL LOW (ref >60)
GFR calc non Af Amer: 21 mL/min — ABNORMAL LOW (ref >60)
Glucose, Bld: 235 mg/dL — ABNORMAL HIGH (ref 65–99)
Potassium: 5.9 mmol/L — ABNORMAL HIGH (ref 3.5–5.1)
Sodium: 136 mmol/L (ref 135–145)

## 2015-10-06 LAB — I-STAT CG4 LACTIC ACID, ED
Lactic Acid, Venous: 2.26 mmol/L (ref 0.5–1.9)
Lactic Acid, Venous: 1.56 mmol/L (ref 0.5–1.9)

## 2015-10-06 LAB — URINE MICROSCOPIC-ADD ON

## 2015-10-06 LAB — APTT: aPTT: 44 seconds — ABNORMAL HIGH (ref 24–37)

## 2015-10-06 LAB — HEPARIN LEVEL (UNFRACTIONATED): Heparin Unfractionated: 1.02 IU/mL — ABNORMAL HIGH (ref 0.30–0.70)

## 2015-10-06 LAB — MRSA PCR SCREENING: MRSA by PCR: NEGATIVE

## 2015-10-06 MED ORDER — AMITRIPTYLINE HCL 10 MG PO TABS
10.0000 mg | ORAL_TABLET | Freq: Every day | ORAL | Status: DC
  Administered 2015-10-06 – 2015-10-17 (×12): 10 mg via ORAL
  Filled 2015-10-06 (×13): qty 1

## 2015-10-06 MED ORDER — TECHNETIUM TC 99M DIETHYLENETRIAME-PENTAACETIC ACID: 31.0000 | Freq: Once | INTRAVENOUS | Status: DC | PRN

## 2015-10-06 MED ORDER — INSULIN ASPART 100 UNIT/ML ~~LOC~~ SOLN
0.0000 [IU] | SUBCUTANEOUS | Status: DC
  Administered 2015-10-07: 2 [IU] via SUBCUTANEOUS
  Administered 2015-10-07: 1 [IU] via SUBCUTANEOUS
  Administered 2015-10-07: 2 [IU] via SUBCUTANEOUS
  Administered 2015-10-07 (×2): 3 [IU] via SUBCUTANEOUS
  Administered 2015-10-08 – 2015-10-09 (×3): 2 [IU] via SUBCUTANEOUS
  Administered 2015-10-10 (×4): 1 [IU] via SUBCUTANEOUS
  Administered 2015-10-11: 2 [IU] via SUBCUTANEOUS
  Administered 2015-10-11: 1 [IU] via SUBCUTANEOUS
  Administered 2015-10-11: 2 [IU] via SUBCUTANEOUS

## 2015-10-06 MED ORDER — SODIUM CHLORIDE 0.9 % IV BOLUS (SEPSIS)
1000.0000 mL | Freq: Once | INTRAVENOUS | Status: AC
  Administered 2015-10-06: 1000 mL via INTRAVENOUS

## 2015-10-06 MED ORDER — DILTIAZEM HCL ER COATED BEADS 240 MG PO CP24
240.0000 mg | ORAL_CAPSULE | Freq: Every day | ORAL | Status: DC
  Administered 2015-10-07 – 2015-10-08 (×2): 240 mg via ORAL
  Filled 2015-10-06: qty 1
  Filled 2015-10-06: qty 2
  Filled 2015-10-06: qty 1

## 2015-10-06 MED ORDER — LEVALBUTEROL HCL 0.63 MG/3ML IN NEBU
0.6300 mg | INHALATION_SOLUTION | Freq: Four times a day (QID) | RESPIRATORY_TRACT | Status: DC | PRN
  Administered 2015-10-06 – 2015-10-12 (×6): 0.63 mg via RESPIRATORY_TRACT
  Filled 2015-10-06 (×5): qty 3

## 2015-10-06 MED ORDER — DEXTROSE 5 % IV SOLN: 1.0000 g | Freq: Once | INTRAVENOUS | Status: DC

## 2015-10-06 MED ORDER — LEVALBUTEROL HCL 0.63 MG/3ML IN NEBU
INHALATION_SOLUTION | RESPIRATORY_TRACT | Status: AC
  Filled 2015-10-06: qty 3

## 2015-10-06 MED ORDER — ACETAMINOPHEN 325 MG PO TABS
650.0000 mg | ORAL_TABLET | Freq: Once | ORAL | Status: AC
  Administered 2015-10-06: 650 mg via ORAL
  Filled 2015-10-06: qty 2

## 2015-10-06 MED ORDER — VANCOMYCIN HCL 10 G IV SOLR
1500.0000 mg | INTRAVENOUS | Status: DC
  Administered 2015-10-06: 1500 mg via INTRAVENOUS
  Filled 2015-10-06: qty 1500

## 2015-10-06 MED ORDER — ALBUTEROL SULFATE (2.5 MG/3ML) 0.083% IN NEBU: 3.0000 mL | INHALATION_SOLUTION | Freq: Four times a day (QID) | RESPIRATORY_TRACT | Status: DC | PRN

## 2015-10-06 MED ORDER — SODIUM POLYSTYRENE SULFONATE 15 GM/60ML PO SUSP
30.0000 g | Freq: Once | ORAL | Status: AC
  Administered 2015-10-06: 30 g via ORAL
  Filled 2015-10-06: qty 120

## 2015-10-06 MED ORDER — CALCIUM CARBONATE 1250 (500 CA) MG PO TABS
1250.0000 mg | ORAL_TABLET | Freq: Every day | ORAL | Status: DC
  Administered 2015-10-07 – 2015-10-18 (×11): 1250 mg via ORAL
  Filled 2015-10-06 (×12): qty 1

## 2015-10-06 MED ORDER — INSULIN ASPART 100 UNIT/ML ~~LOC~~ SOLN: 0.0000 [IU] | Freq: Three times a day (TID) | SUBCUTANEOUS | Status: DC

## 2015-10-06 MED ORDER — POLYETHYLENE GLYCOL 3350 17 G PO PACK: 17.0000 g | PACK | Freq: Every day | ORAL | Status: DC | PRN

## 2015-10-06 MED ORDER — HEPARIN (PORCINE) IN NACL 100-0.45 UNIT/ML-% IJ SOLN
1550.0000 [IU]/h | INTRAMUSCULAR | Status: DC
  Administered 2015-10-06: 1550 [IU]/h via INTRAVENOUS
  Filled 2015-10-06 (×2): qty 250

## 2015-10-06 MED ORDER — GABAPENTIN 100 MG PO CAPS
100.0000 mg | ORAL_CAPSULE | Freq: Every day | ORAL | Status: DC
  Administered 2015-10-07 – 2015-10-18 (×11): 100 mg via ORAL
  Filled 2015-10-06 (×12): qty 1

## 2015-10-06 MED ORDER — DEXTROSE 5 % IV SOLN
1.0000 g | INTRAVENOUS | Status: DC
  Administered 2015-10-07 – 2015-10-08 (×3): 1 g via INTRAVENOUS
  Filled 2015-10-06 (×3): qty 10

## 2015-10-06 MED ORDER — PIPERACILLIN-TAZOBACTAM 3.375 G IVPB 30 MIN
3.3750 g | Freq: Once | INTRAVENOUS | Status: AC
  Administered 2015-10-06: 3.375 g via INTRAVENOUS
  Filled 2015-10-06: qty 50

## 2015-10-06 MED ORDER — ACETAMINOPHEN 325 MG PO TABS
650.0000 mg | ORAL_TABLET | Freq: Four times a day (QID) | ORAL | Status: DC | PRN
  Administered 2015-10-07: 650 mg via ORAL
  Filled 2015-10-06: qty 2

## 2015-10-06 MED ORDER — CYCLOBENZAPRINE HCL 5 MG PO TABS: 5.0000 mg | ORAL_TABLET | Freq: Three times a day (TID) | ORAL | Status: DC | PRN

## 2015-10-06 MED ORDER — SODIUM CHLORIDE 0.9 % IV SOLN
INTRAVENOUS | Status: DC
  Administered 2015-10-06: 21:00:00 via INTRAVENOUS

## 2015-10-06 MED ORDER — TECHNETIUM TO 99M ALBUMIN AGGREGATED
4.0000 | Freq: Once | INTRAVENOUS | Status: AC | PRN
  Administered 2015-10-06: 4 via INTRAVENOUS

## 2015-10-06 MED ORDER — PANTOPRAZOLE SODIUM 40 MG PO TBEC: 40.0000 mg | DELAYED_RELEASE_TABLET | Freq: Every day | ORAL | Status: DC | PRN

## 2015-10-06 MED ORDER — QUETIAPINE FUMARATE 25 MG PO TABS
25.0000 mg | ORAL_TABLET | Freq: Every day | ORAL | Status: DC
  Administered 2015-10-06 – 2015-10-17 (×12): 25 mg via ORAL
  Filled 2015-10-06 (×13): qty 1

## 2015-10-06 NOTE — H&P (Signed)
History and Physical    Roy [name] [id number] DOB: [birth year] DOA: 10/06/2015   PCP: Rogers Blocker, MD Chief Complaint:  Chief Complaint  Patient presents with  . Fall    HPI: Roy [name] is a 80 y.o. male with medical history significant of DM, HTN, OSA, recent DVT in leg in May on Xarelto, remote history of prostate cancer in 2006 with radiation, prolonged remission since then with nl PSA drawn last month he says.  Patient presents to ED for evaluation after multiple falls. He is presenting with his twin sisters. They tried calling him today and did not receive an answer which is unusual. The last talked to him sometime yesterday afternoon. They found him lying on the ground but awake.  Patient reports that he lay down on the ground.  He states that he did not fall.  He says he laid down on the floor of his sun room because he was very tired. He denies any acute pain anywhere. No urinary complaints, specifically denies retention.  Doesn't think he has had a fever. Does have SOB with wheezing and tachypnea. He is not sure why he is so tired.  ED Course: UTI with sepsis, AKI with hyperkalemia of 5.9, given Kayexalate.  Initially started on zosyn / vanc for sepsis.  SOB is improving right now with breathing treatment.  Lactate 2.2 initially now down to 1.5 after 2L bolus.  Review of Systems: As per HPI otherwise 10 point review of systems negative.    Past Medical History  Diagnosis Date  . Diabetes mellitus   . Hypertension   . OSA on CPAP   . GERD (gastroesophageal reflux disease)   . History of prostate cancer 2010-- S/P  EXTERNAL RADIATION  . Hematuria   . Bladder calculi   . Hydronephrosis, left   . Dyspnea on exertion   . Arthritis HANDS/ THUMBS/ SHOULDERS  . Chronic obstructive asthma, unspecified FOLLOWED BY DR Tarri Fuller YOUNG  VISIT 10-14-2011 IN [facility]  . COPD (chronic obstructive pulmonary disease) [facility])     Past Surgical History  Procedure Laterality Date    . Ganglion cyst excision  1988  (APPROX)    LEFT KNEE  . Carpal tunnel release  05-28-2005    LEFT  . Inguinal hernia repair  1955  . Cystoscopy with litholapaxy  10/18/2011    Procedure: CYSTOSCOPY WITH LITHOLAPAXY;  Surgeon: Hanley Ben, MD;  Location: [facility];  Service: Urology;  Laterality: N/A;  1 HOUR NO STENT H# [id number] CELL# [id number]  MEDICARE BCBS  . Cystoscopy N/A 09/26/2012    Procedure: CYSTOSCOPY, clot evacuation,fulgeration of bladder, bladder biopsy, transurethreal vaporation of prostat;  Surgeon: Alexis Frock, MD;  Location: [facility];  Service: Urology;  Laterality: N/A;     reports that he quit smoking about 17 years ago. His smoking use included Cigarettes. He has a 12.5 pack-year smoking history. He has never used smokeless tobacco. He reports that he drinks alcohol. He reports that he does not use illicit drugs.  No Known Allergies  Family History  Problem Relation Age of Onset  . Diabetes Father       Prior to Admission medications   Medication Sig Start Date End Date Taking? Authorizing Provider  albuterol (PROAIR HFA) 108 (90 BASE) MCG/ACT inhaler Inhale 2 puffs into the lungs 4 (four) times daily as needed.    Yes Historical Provider, MD  amitriptyline (ELAVIL) 10 MG tablet Take 10 mg by mouth at bedtime.  Yes Historical Provider, MD  Ascorbic Acid (VITAMIN C) 1000 MG tablet Take 1,000 mg by mouth daily.    Yes Historical Provider, MD  calcium carbonate (OS-CAL) 600 MG TABS tablet Take 600 mg by mouth daily with breakfast.   Yes Historical Provider, MD  cyclobenzaprine (FLEXERIL) 10 MG tablet Take 10 mg by mouth 3 (three) times daily as needed for muscle spasms.   Yes Historical Provider, MD  diltiazem (CARDIZEM CD) 240 MG 24 hr capsule Take 240 mg by mouth daily.   Yes Historical Provider, MD  gabapentin (NEURONTIN) 100 MG capsule Take 100 mg by mouth daily.   Yes Historical Provider, MD  Ginkgo Biloba (GNP GINGKO BILOBA EXTRACT  PO) Take 1 tablet by mouth daily.   Yes Historical Provider, MD  glimepiride (AMARYL) 4 MG tablet Take 4 mg by mouth daily with breakfast.    Yes Historical Provider, MD  lisinopril (PRINIVIL,ZESTRIL) 40 MG tablet Take 40 mg by mouth daily.    Yes Historical Provider, MD  loratadine (CLARITIN) 10 MG tablet Take 10 mg by mouth daily.   Yes Historical Provider, MD  Omega-3 Fatty Acids (FISH OIL) 1000 MG CAPS Take 1 capsule by mouth daily.   Yes Historical Provider, MD  pantoprazole (PROTONIX) 40 MG tablet Take 40 mg by mouth daily as needed (Acid reflux).    Yes Historical Provider, MD  potassium chloride SA (K-DUR,KLOR-CON) 20 MEQ tablet Take 20 mEq by mouth daily.    Yes Historical Provider, MD  sitaGLIPtin (JANUVIA) 100 MG tablet Take 100 mg by mouth daily.    Yes Historical Provider, MD  torsemide (DEMADEX) 100 MG tablet Take 50 mg by mouth 2 (two) times daily.    Yes Historical Provider, MD  vitamin B-12 (CYANOCOBALAMIN) 500 MCG tablet Take 500 mcg by mouth daily.   Yes Historical Provider, MD  cyclobenzaprine (FLEXERIL) 5 MG tablet Take 5 mg by mouth as needed for muscle spasms.     Historical Provider, MD  polyethylene glycol (MIRALAX / GLYCOLAX) packet Take 17 g by mouth daily as needed for moderate constipation.     Historical Provider, MD  QUEtiapine (SEROQUEL) 25 MG tablet Take 25 mg by mouth at bedtime.    Historical Provider, MD  Rivaroxaban 15 & 20 MG TBPK Take as directed on package: Start with one 15mg  tablet by mouth twice a day with food. On Day 22, switch to one 20mg  tablet once a day with food. 08/15/15   Varney Biles, MD    Physical Exam: Filed Vitals:   10/06/15 1634 10/06/15 1700 10/06/15 1811 10/06/15 1921  BP: 160/85 160/88  173/81  Pulse: 118 117  119  Temp: 99.1 F (37.3 C)     TempSrc: Oral     Resp: 27 25  38  Weight:   107.956 kg (238 lb)   SpO2: 97% 96%  96%      Constitutional: NAD, calm, comfortable Eyes: PERRL, lids and conjunctivae normal ENMT:  Mucous membranes are moist. Posterior pharynx clear of any exudate or lesions.Normal dentition.  Neck: normal, supple, no masses, no thyromegaly Respiratory: Tachypnea with wheezing Cardiovascular: Tachycardia Abdomen: no tenderness, no masses palpated. No hepatosplenomegaly. Bowel sounds positive.  Musculoskeletal: no clubbing / cyanosis. No joint deformity upper and lower extremities. Good ROM, no contractures. Normal muscle tone.  Skin: no rashes, lesions, ulcers. No induration Neurologic: CN 2-12 grossly intact. Sensation intact, DTR normal. Strength 5/5 in all 4.  Psychiatric: Normal judgment and insight. Alert and oriented x 3. Normal  mood.    Labs on Admission: I have personally reviewed following labs and imaging studies  CBC:  Recent Labs Lab 10/06/15 1516  WBC 19.1*  NEUTROABS 17.8*  HGB 14.8  HCT 43.1  MCV 91.3  PLT 123456   Basic Metabolic Panel:  Recent Labs Lab 10/06/15 1516  NA 136  K 5.9*  CL 104  CO2 23  GLUCOSE 235*  BUN 49*  CREATININE 2.72*  CALCIUM 10.8*   GFR: Estimated Creatinine Clearance: 27.5 mL/min (by C-G formula based on Cr of 2.72). Liver Function Tests: No results for input(s): AST, ALT, ALKPHOS, BILITOT, PROT, ALBUMIN in the last 168 hours. No results for input(s): LIPASE, AMYLASE in the last 168 hours. No results for input(s): AMMONIA in the last 168 hours. Coagulation Profile: No results for input(s): INR, PROTIME in the last 168 hours. Cardiac Enzymes: No results for input(s): CKTOTAL, CKMB, CKMBINDEX, TROPONINI in the last 168 hours. BNP (last 3 results) No results for input(s): PROBNP in the last 8760 hours. HbA1C: No results for input(s): HGBA1C in the last 72 hours. CBG: No results for input(s): GLUCAP in the last 168 hours. Lipid Profile: No results for input(s): CHOL, HDL, LDLCALC, TRIG, CHOLHDL, LDLDIRECT in the last 72 hours. Thyroid Function Tests: No results for input(s): TSH, T4TOTAL, FREET4, T3FREE, THYROIDAB in the  last 72 hours. Anemia Panel: No results for input(s): VITAMINB12, FOLATE, FERRITIN, TIBC, IRON, RETICCTPCT in the last 72 hours. Urine analysis:    Component Value Date/Time   COLORURINE AMBER* 10/06/2015 1811   APPEARANCEUR TURBID* 10/06/2015 1811   LABSPEC 1.018 10/06/2015 1811   PHURINE 5.5 10/06/2015 1811   GLUCOSEU NEGATIVE 10/06/2015 1811   HGBUR LARGE* 10/06/2015 1811   BILIRUBINUR NEGATIVE 10/06/2015 1811   KETONESUR NEGATIVE 10/06/2015 1811   PROTEINUR >300* 10/06/2015 1811   UROBILINOGEN 0.2 09/26/2012 1222   NITRITE POSITIVE* 10/06/2015 1811   LEUKOCYTESUR LARGE* 10/06/2015 1811   Sepsis Labs: @LABRCNTIP (procalcitonin:4,lacticidven:4) )No results found for this or any previous visit (from the past 240 hour(s)).   Radiological Exams on Admission: Dg Chest 2 View  10/06/2015  CLINICAL DATA:  Status post fall. EXAM: CHEST  2 VIEW COMPARISON:  CT chest 01/14/2006 FINDINGS: The heart size and mediastinal contours are within normal limits. Both lungs are clear. The visualized skeletal structures are unremarkable. IMPRESSION: No active cardiopulmonary disease. Electronically Signed   By: Kathreen Devoid   On: 10/06/2015 15:40   Ct Head Wo Contrast  10/06/2015  CLINICAL DATA:  Several recent falls EXAM: CT HEAD WITHOUT CONTRAST TECHNIQUE: Contiguous axial images were obtained from the base of the skull through the vertex without intravenous contrast. COMPARISON:  June 02, 2014 FINDINGS: Brain: There is mild diffuse atrophy. There is no intracranial mass, hemorrhage, extra-axial fluid collection, or midline shift. There is mild small vessel disease in the centra semiovale bilaterally. Elsewhere gray-white compartments are normal. No acute infarct evident. Vascular: There is calcification in each cavernous carotid artery. No hyperdense vessels are apparent. Skull: The bony calvarium appears intact. Sinuses/Orbits: Orbits appear symmetric bilaterally. Visualized paranasal sinuses are clear.  Other: Mastoid air cells are clear. IMPRESSION: Atrophy with mild periventricular small vessel disease. No intracranial mass, hemorrhage, or extra-axial fluid collection. No acute infarct evident. There are foci of arterial vascular calcification in the cavernous carotid arteries. Electronically Signed   By: Lowella Grip III M.D.   On: 10/06/2015 16:19    EKG: Independently reviewed.  Assessment/Plan Principal Problem:   Sepsis secondary to UTI [facility]) Active Problems:  DM2 (diabetes mellitus, type 2) (HCC)   Obstructive sleep apnea   Essential hypertension   CKD (chronic kidney disease) stage 3, GFR 30-59 ml/min   AKI (acute kidney injury) ([place])   UTI (lower urinary tract infection)    Sepsis secondary to UTI  2L IVF in ED, will back down to 75 cc/hr, patient BP is now [id number] systolic  Rocephin  Cultures pending  Lactate improved  AKI on CKD stage 3 -  Granular casts and sepsis would argue ATN  Patient certainly does have high maps for this though with SBP of 160s to 170s in the ED  Bladder scan  US renal  Repeat BMP in AM  Intake and output  Hold nephrotoxic ACEi and diuretics   DVT -  Hold Xarelto  Heparin gtt  Check VQ scan in AM  NPO after midnight  DM2 - SSI q4h  Hyperkalemia - got kayexelate and IVF, recheck K at midnight  OSA - continue cpap at night     DVT prophylaxis: Heparin gtt Code Status: Full Family Communication: Family at bedside Consults called: None Admission status: Admit to inpatient   Etta Quill DO [facility] Pager [phone] from 7PM-7AM  If 7AM-7PM, please contact the day physician for the patient [email or web address] Password [email or web address]  10/06/2015, 8:11 PM

## 2015-10-06 NOTE — ED Provider Notes (Signed)
CSN: [id number]     Arrival date & time 10/06/15  1341 History   First MD Initiated Contact with Patient 10/06/15 1424     Chief Complaint  Patient presents with  . Fall     (Consider location/radiation/quality/duration/timing/severity/associated sxs/prior Treatment) HPI   80 year old male presenting for evaluation after multiple falls. He is presenting with his twin sisters. They tried calling him today and did not receive an answer which is unusual. The last talked to him sometime yesterday afternoon. They found him lying on the ground but awake. Patient reports that he lay down on the ground. He states that he did not fall. He says he laid down on the floor of his sun room because he was very tired. He denies any acute pain anywhere. Denies any fever. No urinary complaints. Doesn't think he has had a fever. No cough. No SOB. He is not sure why he is so tired. He is anticoagulated on xarelto for LE DVT.     Past Medical History  Diagnosis Date  . Diabetes mellitus   . Hypertension   . OSA on CPAP   . GERD (gastroesophageal reflux disease)   . History of prostate cancer 2010-- S/P  EXTERNAL RADIATION  . Hematuria   . Bladder calculi   . Hydronephrosis, left   . Dyspnea on exertion   . Arthritis HANDS/ THUMBS/ SHOULDERS  . Chronic obstructive asthma, unspecified FOLLOWED BY DR Tarri Fuller YOUNG  VISIT 10-14-2011 IN [facility]  . COPD (chronic obstructive pulmonary disease) [facility])    Past Surgical History  Procedure Laterality Date  . Ganglion cyst excision  1988  (APPROX)    LEFT KNEE  . Carpal tunnel release  05-28-2005    LEFT  . Inguinal hernia repair  1955  . Cystoscopy with litholapaxy  10/18/2011    Procedure: CYSTOSCOPY WITH LITHOLAPAXY;  Surgeon: Hanley Ben, MD;  Location: [facility];  Service: Urology;  Laterality: N/A;  1 HOUR NO STENT H# [id number] CELL# [id number]  MEDICARE BCBS  . Cystoscopy N/A 09/26/2012    Procedure: CYSTOSCOPY, clot  evacuation,fulgeration of bladder, bladder biopsy, transurethreal vaporation of prostat;  Surgeon: Alexis Frock, MD;  Location: [facility];  Service: Urology;  Laterality: N/A;   Family History  Problem Relation Age of Onset  . Diabetes Father    Social History  Substance Use Topics  . Smoking status: Former Smoker -- 0.50 packs/day for 25 years    Types: Cigarettes    Quit date: 03/25/1998  . Smokeless tobacco: Never Used  . Alcohol Use: Yes     Comment: OCCASIONAL BEER    Review of Systems  All systems reviewed and negative, other than as noted in HPI.   Allergies  Review of patient's allergies indicates no known allergies.  Home Medications   Prior to Admission medications   Medication Sig Start Date End Date Taking? Authorizing Provider  albuterol (PROAIR HFA) 108 (90 BASE) MCG/ACT inhaler Inhale 2 puffs into the lungs 4 (four) times daily as needed.     Historical Provider, MD  amitriptyline (ELAVIL) 10 MG tablet Take 10 mg by mouth at bedtime as needed for sleep.     Historical Provider, MD  Ascorbic Acid (VITAMIN C) 1000 MG tablet Take 1,000 mg by mouth daily.     Historical Provider, MD  calcium carbonate (OS-CAL) 600 MG TABS tablet Take 600 mg by mouth daily with breakfast.    Historical Provider, MD  cyclobenzaprine (FLEXERIL) 5 MG tablet Take 5 mg by  mouth as needed for muscle spasms.     Historical Provider, MD  diltiazem (TIAZAC) 360 MG 24 hr capsule Take 360 mg by mouth daily as needed (High blood pressure Systolic>130).    Historical Provider, MD  gabapentin (NEURONTIN) 100 MG capsule Take 100 mg by mouth daily.    Historical Provider, MD  glimepiride (AMARYL) 4 MG tablet Take 4 mg by mouth daily as needed (High blood sugar >120).     Historical Provider, MD  lisinopril (PRINIVIL,ZESTRIL) 40 MG tablet Take 40 mg by mouth daily as needed (High blood pressure >130).     Historical Provider, MD  loratadine (CLARITIN) 10 MG tablet Take 10 mg by mouth daily.    Historical  Provider, MD  pantoprazole (PROTONIX) 40 MG tablet Take 40 mg by mouth daily as needed (Acid reflux).     Historical Provider, MD  polyethylene glycol (MIRALAX / GLYCOLAX) packet Take 17 g by mouth as needed.     Historical Provider, MD  potassium chloride SA (K-DUR,KLOR-CON) 20 MEQ tablet Take 20 mEq by mouth daily.     Historical Provider, MD  QUEtiapine (SEROQUEL) 25 MG tablet Take 25 mg by mouth at bedtime.    Historical Provider, MD  Rivaroxaban 15 & 20 MG TBPK Take as directed on package: Start with one 15mg  tablet by mouth twice a day with food. On Day 22, switch to one 20mg  tablet once a day with food. 08/15/15   Varney Biles, MD  sitaGLIPtin (JANUVIA) 100 MG tablet Take 100 mg by mouth daily as needed (High blood sugar >120).     Historical Provider, MD  tamsulosin (FLOMAX) 0.4 MG CAPS Take 0.4 mg by mouth daily.    Historical Provider, MD  torsemide (DEMADEX) 100 MG tablet Take 100 mg by mouth daily.     Historical Provider, MD   BP 150/86 mmHg  Pulse 115  Temp(Src) 99.7 F (37.6 C) (Oral)  Resp 20  SpO2 95% Physical Exam  Constitutional: He appears well-developed and well-nourished. No distress.  HENT:  Head: Normocephalic and atraumatic.  Dry mucus membranes  Eyes: Conjunctivae are normal. Right eye exhibits no discharge. Left eye exhibits no discharge.  Neck: Neck supple.  Cardiovascular: Normal rate, regular rhythm and normal heart sounds.  Exam reveals no gallop and no friction rub.   No murmur heard. Pulmonary/Chest: Effort normal and breath sounds normal. No respiratory distress.  Abdominal: Soft. He exhibits no distension. There is no tenderness.  Musculoskeletal: He exhibits no edema or tenderness.  Neurological: He is alert. No cranial nerve deficit. He exhibits normal muscle tone. Coordination normal.  Disoriented to time. Mild confusion but answers simple questions and follows simple commands. CN 2-12 intact No focal motor deficit.   Skin: Skin is warm and dry.   Psychiatric: He has a normal mood and affect. His behavior is normal. Thought content normal.  Nursing note and vitals reviewed.   ED Course  Procedures (including critical care time)  Angiocath insertion Performed by: Virgel Manifold  Consent: Verbal consent obtained. Risks and benefits: risks, benefits and alternatives were discussed Time out: Immediately prior to procedure a "time out" was called to verify the correct patient, procedure, equipment, support staff and site/side marked as required.  Preparation: Patient was prepped and draped in the usual sterile fashion.  Vein Location: L AC  Ultrasound Guided  Gauge: 20g  Normal blood return and flush without difficulty Patient tolerance: Patient tolerated the procedure well with no immediate complications.    Labs  Review Labs Reviewed  CBC WITH DIFFERENTIAL/PLATELET - Abnormal; Notable for the following:    WBC 19.1 (*)    Neutro Abs 17.8 (*)    Lymphs Abs 0.6 (*)    All other components within normal limits  BASIC METABOLIC PANEL - Abnormal; Notable for the following:    Potassium 5.9 (*)    Glucose, Bld 235 (*)    BUN 49 (*)    Creatinine, Ser 2.72 (*)    Calcium 10.8 (*)    GFR calc non Af Amer 21 (*)    GFR calc Af Amer 24 (*)    All other components within normal limits  I-STAT CG4 LACTIC ACID, ED - Abnormal; Notable for the following:    Lactic Acid, Venous 2.26 (*)    All other components within normal limits  CULTURE, BLOOD (ROUTINE X 2)  CULTURE, BLOOD (ROUTINE X 2)  URINALYSIS, ROUTINE W REFLEX MICROSCOPIC (NOT AT [facility])    Imaging Review Dg Chest 2 View  10/06/2015  CLINICAL DATA:  Status post fall. EXAM: CHEST  2 VIEW COMPARISON:  CT chest 01/14/2006 FINDINGS: The heart size and mediastinal contours are within normal limits. Both lungs are clear. The visualized skeletal structures are unremarkable. IMPRESSION: No active cardiopulmonary disease. Electronically Signed   By: Kathreen Devoid   On:  10/06/2015 15:40   Ct Head Wo Contrast  10/06/2015  CLINICAL DATA:  Several recent falls EXAM: CT HEAD WITHOUT CONTRAST TECHNIQUE: Contiguous axial images were obtained from the base of the skull through the vertex without intravenous contrast. COMPARISON:  June 02, 2014 FINDINGS: Brain: There is mild diffuse atrophy. There is no intracranial mass, hemorrhage, extra-axial fluid collection, or midline shift. There is mild small vessel disease in the centra semiovale bilaterally. Elsewhere gray-white compartments are normal. No acute infarct evident. Vascular: There is calcification in each cavernous carotid artery. No hyperdense vessels are apparent. Skull: The bony calvarium appears intact. Sinuses/Orbits: Orbits appear symmetric bilaterally. Visualized paranasal sinuses are clear. Other: Mastoid air cells are clear. IMPRESSION: Atrophy with mild periventricular small vessel disease. No intracranial mass, hemorrhage, or extra-axial fluid collection. No acute infarct evident. There are foci of arterial vascular calcification in the cavernous carotid arteries. Electronically Signed   By: Lowella Grip III M.D.   On: 10/06/2015 16:19   I have personally reviewed and evaluated these images and lab results as part of my medical decision-making.   EKG Interpretation   Date/Time:  Friday October 06 2015 15:09:29 EDT Ventricular Rate:  120 PR Interval:    QRS Duration: 85 QT Interval:  304 QTC Calculation: 430 R Axis:   -62 Text Interpretation:  Sinus tachycardia with irregular rate with frequent  pacs Left axis deviation Abnormal R-wave progression, early transition  Confirmed by [place] ([id number]) on 10/06/2015 3:46:47 PM      MDM   Final diagnoses:  UTI (lower urinary tract infection)  Fall, initial encounter    80yM with urosepsis. Falls. Encephalopathy. Abx. Admission.      Virgel Manifold, MD 10/09/15 [phone]

## 2015-10-06 NOTE — ED Notes (Signed)
Admitting MD at bedside.

## 2015-10-06 NOTE — ED Notes (Signed)
Bed: [id number] Expected date:  Expected time:  Means of arrival:  Comments: EMS- dehydration?

## 2015-10-06 NOTE — ED Notes (Signed)
Patient transported to CT 

## 2015-10-06 NOTE — ED Notes (Signed)
Pt transported to xray 

## 2015-10-06 NOTE — Progress Notes (Signed)
Pharmacy Antibiotic Note  Roy [name] is a 80 y.o. male admitted on 10/06/2015 with sepsis.  Pharmacy has been consulted for vancomycin dosing. Zosyn x 1 given in ED.    Plan: Vancomycin for 1500 mg IV q48h for CrCl~22 ml/min/1.38m2. F/u for further doses of Zosyn. F/u SCr for dose adjustments, trough levels as indicated, culture results.     Temp (24hrs), Avg:100.3 F (37.9 C), Min:99.1 F (37.3 C), Max:102.2 F (39 C)   Recent Labs Lab 10/06/15 1516 10/06/15 1641  WBC 19.1*  --   CREATININE 2.72*  --   LATICACIDVEN  --  2.26*    CrCl cannot be calculated (Unknown ideal weight.).    No Known Allergies  Antimicrobials this admission: 7/14 Vanc >>  7/14 Zosyn x 1  Dose adjustments this admission: -  Microbiology results: 7/14 BCx: sent  Thank you for allowing pharmacy to be a part of this patient's care.  Hershal Coria 10/06/2015 5:43 PM

## 2015-10-06 NOTE — ED Notes (Signed)
Unable to collect labs patient is not in the room 

## 2015-10-06 NOTE — ED Notes (Signed)
Attempted the in and out cath but was catheter was met with resistance.  Unable to collect urine at this time.  Dr. Wilson Singer notified.

## 2015-10-06 NOTE — ED Notes (Signed)
Per EMS report: pt coming from home and has fall three times in the past three days.  Pt denies pain nor did EMS note an obvious deformities or injuries.  Pt at baseline mentation. Hx DM type 2, Afib. Compliant of with medications.  EMS notes his lips were dry and split.  EMS VS: BP 160/90, HR: 105-120, RR: 18 CBG: 208,

## 2015-10-06 NOTE — Progress Notes (Signed)
ANTICOAGULATION CONSULT NOTE - Initial Consult  Pharmacy Consult for Heparin Indication: VTE treatment   No Known Allergies  Patient Measurements: Weight: 238 lb (107.956 kg) Heparin Dosing Weight: 96.3kg  Vital Signs: Temp: 99.1 F (37.3 C) (07/14 1634) Temp Source: Oral (07/14 1634) BP: 160/88 mmHg (07/14 1700) Pulse Rate: 117 (07/14 1700)  Labs:  Recent Labs  10/06/15 1516  HGB 14.8  HCT 43.1  PLT 225  CREATININE 2.72*    Estimated Creatinine Clearance: 27.5 mL/min (by C-G formula based on Cr of 2.72).   Medical History: Past Medical History  Diagnosis Date  . Diabetes mellitus   . Hypertension   . OSA on CPAP   . GERD (gastroesophageal reflux disease)   . History of prostate cancer 2010-- S/P  EXTERNAL RADIATION  . Hematuria   . Bladder calculi   . Hydronephrosis, left   . Dyspnea on exertion   . Arthritis HANDS/ THUMBS/ SHOULDERS  . Chronic obstructive asthma, unspecified FOLLOWED BY DR Tarri Fuller YOUNG  VISIT 10-14-2011 IN [facility]  . COPD (chronic obstructive pulmonary disease) (HCC)     Medications:  PTA xarelto starter pack prescribed 08/15/15 however pt did not pick up prescription from pharmacy due to insurance issues, told EDP he was receiving xarelto samples from PCP (unable to confirm dose yet).  LD 7/12 @ 9AM.   Assessment: 15 yoM with PMHx prostate cancer, DM, HTN, OSA on CPAP, asthma, and COPD with recent dx acute DVT in May 2017 taking xarelto samples from PCP now presents for evaluation after multiple falls. Pharmacy consulted to start IV heparin as pt has AKI.   SCr 2.72, CrCl 27.5 ml/min CBC: No issues with hgb or platelets Baseline aPTT / HL ordered Ok to start IV heparin as LD on 7/12 in AM.   Will use HL and aPTT levels for now until xarelto cleared, then can use HLs alone.     Goal of Therapy:  Heparin level 0.3-0.7 units/ml  APTT 66-102 seconds Monitor platelets by anticoagulation protocol: Yes   Plan:  F/u baseline aPTT /  HL Heparin infusion 1550 units/hr = 15.5 ml/hr No heparin bolus as pt likely still has xarelto on board, especially with AKI F/u aPTT and HL 8 hours after infusion starts Daily HLs until HL and aPTT correlate F/u renal function F/u anticoag plans  Ralene Bathe, PharmD, BCPS 10/06/2015, 8:33 PM  Pager: [phone]

## 2015-10-06 NOTE — Progress Notes (Signed)
Per chart review patient presents to ED having three falls within the last three days.  EDCM spoke to patient at bedside.  Patient lives alone.  Patient's twin sisters at bedside report they live close to the patient.  Patient confirms his pcp is Dr. Rogers Blocker.  Patient reports he is usually able to complete his ADL's on his own without difficulty.  Patient reports he has a cane, which is at bedside.  No other dme at home.  Patient has never had home health services before.  EDCM provided patient with list of home health agencies in [facility], explained services.  EDCM explained home health agencyof his choice will have 24-48 hours to contact him.  Patient verbalized understanding and thankful for resources.  Awaiting disposition.  No further EDCM needs at this time.

## 2015-10-07 ENCOUNTER — Inpatient Hospital Stay (HOSPITAL_COMMUNITY): Payer: Medicare Other

## 2015-10-07 LAB — GLUCOSE, CAPILLARY
GLUCOSE-CAPILLARY: 194 mg/dL — AB (ref 65–99)
GLUCOSE-CAPILLARY: 212 mg/dL — AB (ref 65–99)
GLUCOSE-CAPILLARY: 202 mg/dL — AB (ref 65–99)
Glucose-Capillary: 159 mg/dL — ABNORMAL HIGH (ref 65–99)
Glucose-Capillary: 145 mg/dL — ABNORMAL HIGH (ref 65–99)

## 2015-10-07 LAB — BASIC METABOLIC PANEL
Anion gap: 6 (ref 5–15)
BUN: 52 mg/dL — ABNORMAL HIGH (ref 6–20)
CALCIUM: 9.9 mg/dL (ref 8.9–10.3)
CO2: 22 mmol/L (ref 22–32)
CREATININE: 2.66 mg/dL — AB (ref 0.61–1.24)
Chloride: 110 mmol/L (ref 101–111)
GFR calc non Af Amer: 21 mL/min — ABNORMAL LOW (ref >60)
GFR, EST AFRICAN AMERICAN: 25 mL/min — AB (ref >60)
GLUCOSE: 200 mg/dL — AB (ref 65–99)
Potassium: 4.5 mmol/L (ref 3.5–5.1)
Sodium: 138 mmol/L (ref 135–145)

## 2015-10-07 LAB — CBC
HCT: 36.7 % — ABNORMAL LOW (ref 39.0–52.0)
Hemoglobin: 12.5 g/dL — ABNORMAL LOW (ref 13.0–17.0)
MCH: 31.2 pg (ref 26.0–34.0)
MCHC: 34.1 g/dL (ref 30.0–36.0)
MCV: 91.5 fL (ref 78.0–100.0)
Platelets: 193 K/uL (ref 150–400)
RBC: 4.01 MIL/uL — ABNORMAL LOW (ref 4.22–5.81)
RDW: 14.5 % (ref 11.5–15.5)
WBC: 15.7 K/uL — ABNORMAL HIGH (ref 4.0–10.5)

## 2015-10-07 LAB — HEPARIN LEVEL (UNFRACTIONATED)
HEPARIN UNFRACTIONATED: 0.75 [IU]/mL — AB (ref 0.30–0.70)
Heparin Unfractionated: 0.45 IU/mL (ref 0.30–0.70)
Heparin Unfractionated: 0.36 IU/mL (ref 0.30–0.70)

## 2015-10-07 LAB — BLOOD GAS, ARTERIAL
Acid-base deficit: 4.5 mmol/L — ABNORMAL HIGH (ref 0.0–2.0)
BICARBONATE: 17.7 meq/L — AB (ref 20.0–24.0)
Drawn by: 331471
O2 CONTENT: 2 L/min
O2 Saturation: 98.7 %
PH ART: 7.451 — AB (ref 7.350–7.450)
PO2 ART: 132 mmHg — AB (ref 80.0–100.0)
Patient temperature: 37
TCO2: 15.7 mmol/L (ref 0–100)
pCO2 arterial: 25.8 mmHg — ABNORMAL LOW (ref 35.0–45.0)

## 2015-10-07 LAB — POTASSIUM: Potassium: 4.4 mmol/L (ref 3.5–5.1)

## 2015-10-07 LAB — APTT
aPTT: 125 seconds — ABNORMAL HIGH (ref 24–37)
aPTT: 92 seconds — ABNORMAL HIGH (ref 24–37)

## 2015-10-07 LAB — RAPID STREP SCREEN (MED CTR MEBANE ONLY): Streptococcus, Group A Screen (Direct): NEGATIVE

## 2015-10-07 MED ORDER — FUROSEMIDE 10 MG/ML IJ SOLN
INTRAMUSCULAR | Status: AC
  Filled 2015-10-07: qty 4

## 2015-10-07 MED ORDER — FUROSEMIDE 10 MG/ML IJ SOLN
40.0000 mg | Freq: Once | INTRAMUSCULAR | Status: AC
  Administered 2015-10-07: 40 mg via INTRAVENOUS

## 2015-10-07 MED ORDER — VANCOMYCIN HCL 10 G IV SOLR
1500.0000 mg | INTRAVENOUS | Status: DC
  Administered 2015-10-08: 1500 mg via INTRAVENOUS
  Filled 2015-10-07: qty 1500

## 2015-10-07 MED ORDER — SODIUM CHLORIDE 0.9 % IV SOLN: INTRAVENOUS | Status: DC

## 2015-10-07 MED ORDER — HEPARIN (PORCINE) IN NACL 100-0.45 UNIT/ML-% IJ SOLN
1450.0000 [IU]/h | INTRAMUSCULAR | Status: DC
  Administered 2015-10-07: 1400 [IU]/h via INTRAVENOUS
  Administered 2015-10-08: 1450 [IU]/h via INTRAVENOUS
  Administered 2015-10-08: 1400 [IU]/h via INTRAVENOUS
  Administered 2015-10-09 – 2015-10-14 (×7): 1450 [IU]/h via INTRAVENOUS
  Filled 2015-10-07 (×12): qty 250

## 2015-10-07 NOTE — Progress Notes (Signed)
ANTICOAGULATION CONSULT NOTE - f/u Consult  Pharmacy Consult for Heparin Indication: VTE treatment   No Known Allergies  Patient Measurements: Weight: 226 lb 10.1 oz (102.8 kg) Heparin Dosing Weight: 96.3kg  Vital Signs: Temp: 98.9 F (37.2 C) (07/15 0400) Temp Source: Oral (07/15 0400) BP: 138/78 mmHg (07/15 0400) Pulse Rate: 101 (07/15 0400)  Labs:  Recent Labs  10/06/15 1516 10/06/15 2018 10/07/15 0509  HGB 14.8  --  12.5*  HCT 43.1  --  36.7*  PLT 225  --  193  APTT  --  44* 125*  HEPARINUNFRC  --  1.02* 0.75*  CREATININE 2.72*  --  2.66*    Estimated Creatinine Clearance: 27.5 mL/min (by C-G formula based on Cr of 2.66).   Medical History: Past Medical History  Diagnosis Date  . Diabetes mellitus   . Hypertension   . OSA on CPAP   . GERD (gastroesophageal reflux disease)   . History of prostate cancer 2010-- S/P  EXTERNAL RADIATION  . Hematuria   . Bladder calculi   . Hydronephrosis, left   . Dyspnea on exertion   . Arthritis HANDS/ THUMBS/ SHOULDERS  . Chronic obstructive asthma, unspecified FOLLOWED BY DR Tarri Fuller YOUNG  VISIT 10-14-2011 IN [facility]  . COPD (chronic obstructive pulmonary disease) (HCC)     Medications:  PTA xarelto starter pack prescribed 08/15/15 however pt did not pick up prescription from pharmacy due to insurance issues, told EDP he was receiving xarelto samples from PCP (unable to confirm dose yet).  LD 7/12 @ 9AM.   Assessment: 60 yoM with PMHx prostate cancer, DM, HTN, OSA on CPAP, asthma, and COPD with recent dx acute DVT in May 2017 taking xarelto samples from PCP now presents for evaluation after multiple falls. Pharmacy consulted to start IV heparin as pt has AKI.   SCr 2.72, CrCl 27.5 ml/min CBC: No issues with hgb or platelets Baseline aPTT / HL ordered Ok to start IV heparin as LD on 7/12 in AM.   Will use HL and aPTT levels for now until xarelto cleared, then can use HLs alone.   Today, 7/15 0509 aPtt=125 and HL=0.75,  no infusion or bleeding problems per RN  Goal of Therapy:  Heparin level 0.3-0.7 units/ml  APTT 66-102 seconds Monitor platelets by anticoagulation protocol: Yes   Plan:  Decrease heparin drip to 1400 units/hr (14 ml/hr) Recheck aPtt in 8 hours Daily HLs until HL and aPTT correlate F/u renal function F/u anticoag plans  Dorrene German  10/07/2015, 6:04 AM

## 2015-10-07 NOTE — Evaluation (Signed)
Physical Therapy Evaluation Patient Details Name: Roy [name] MRN: [id number] DOB: 1935/04/03 Today's Date: 10/07/2015   History of Present Illness  80 y.o. male with medical history significant of DM, HTN, OSA, recent DVT in leg in May on Xarelto, remote history of prostate cancer in 2006 with radiation, prolonged remission since then, admitted on 7/14 with weakness/falls and tiredness, found by family on the floor. No syncope/falls. Found to be septic  Clinical Impression  Pt admitted with above diagnosis. Pt currently with functional limitations due to the deficits listed below (see PT Problem List).   Pt will benefit from skilled PT to increase their independence and safety with mobility to allow discharge to the venue listed below.   See below for details and VS; pt is quite dyspneic with amb x 10' today, fatigues quickly; will likely benefit from HHPT, may need incr family support for a few days depending on progress     Follow Up Recommendations Home health PT    Equipment Recommendations       Recommendations for Other Services       Precautions / Restrictions Precautions Precautions: Fall Restrictions Weight Bearing Restrictions: No      Mobility  Bed Mobility Overal bed mobility: Needs Assistance Bed Mobility: Supine to Sit     Supine to sit: Min assist     General bed mobility comments: assist to bring trunk upright, incr time, effortful transition  Transfers Overall transfer level: Needs assistance Equipment used: Rolling walker (2 wheeled) Transfers: Sit to/from Stand Sit to Stand: Min assist         General transfer comment: uses rocking/momentum to stand; min-min/guard for to rise and steady once in standing  Ambulation/Gait Ambulation/Gait assistance: Min guard;Min assist Ambulation Distance (Feet): 10 Feet Assistive device: Rolling walker (2 wheeled) Gait Pattern/deviations: Step-through pattern;Decreased stride length Gait velocity: HR  99-114; RR 25-37   General Gait Details: cues for RW safety, pt is dyspneic with amb this short distance;   Stairs            Wheelchair Mobility    Modified Rankin (Stroke Patients Only)       Balance Overall balance assessment: Needs assistance   Sitting balance-Leahy Scale: Fair       Standing balance-Leahy Scale: Poor Standing balance comment: reliant on UEs for balance                             Pertinent Vitals/Pain Pain Assessment: No/denies pain    Home Living Family/patient expects to be discharged to:: Private residence Living Arrangements: Alone Available Help at Discharge: Family;Available PRN/intermittently Type of Home: House       Home Layout: Two level Home Equipment: Cane - single point Additional Comments: pt's twin sisters and his son check in on him every day    Prior Function Level of Independence: Independent;Independent with assistive device(s)         Comments: amb with cane at baseline     Hand Dominance        Extremity/Trunk Assessment   Upper Extremity Assessment: Defer to OT evaluation;Overall WFL for tasks assessed           Lower Extremity Assessment: Generalized weakness         Communication   Communication: No difficulties  Cognition Arousal/Alertness: Awake/alert Behavior During Therapy: WFL for tasks assessed/performed Overall Cognitive Status: Within Functional Limits for tasks assessed  General Comments      Exercises        Assessment/Plan    PT Assessment Patient needs continued PT services  PT Diagnosis Difficulty walking   PT Problem List Decreased strength;Decreased activity tolerance;Decreased mobility;Decreased knowledge of use of DME  PT Treatment Interventions DME instruction;Gait training;Functional mobility training;Therapeutic activities;Therapeutic exercise;Patient/family education   PT Goals (Current goals can be found in the Care  Plan section) Acute Rehab PT Goals Patient Stated Goal: home, feel better PT Goal Formulation: With patient Time For Goal Achievement: 10/14/15 Potential to Achieve Goals: Good    Frequency Min 3X/week   Barriers to discharge        Co-evaluation               End of Session Equipment Utilized During Treatment: Gait belt Activity Tolerance: Patient tolerated treatment well Patient left: in chair;with call bell/phone within reach (no chair alarm per RN) Nurse Communication: Mobility status         Time: [id number] PT Time Calculation (min) (ACUTE ONLY): 18 min   Charges:   PT Evaluation $PT Eval Moderate Complexity: 1 Procedure     PT G Codes:        Chaylee Ehrsam 10/16/2015, 3:34 PM

## 2015-10-07 NOTE — Progress Notes (Signed)
ANTICOAGULATION CONSULT NOTE - f/u Consult  Pharmacy Consult for Heparin Indication: VTE treatment   No Known Allergies  Patient Measurements: Height: 5\' 10"  (177.8 cm) Weight: 226 lb 10.1 oz (102.8 kg) IBW/kg (Calculated) : 73 Heparin Dosing Weight: 96.3kg  Vital Signs: Temp: 98.8 F (37.1 C) (07/15 2000) Temp Source: Oral (07/15 2000) BP: 177/90 mmHg (07/15 2100) Pulse Rate: 107 (07/15 2100)  Labs:  Recent Labs  10/06/15 1516  10/06/15 2018 10/07/15 0509 10/07/15 1418 10/07/15 2208  HGB 14.8  --   --  12.5*  --   --   HCT 43.1  --   --  36.7*  --   --   PLT 225  --   --  193  --   --   APTT  --   --  44* 125* 92*  --   HEPARINUNFRC  --   < > 1.02* 0.75* 0.45 0.36  CREATININE 2.72*  --   --  2.66*  --   --   < > = values in this interval not displayed.  Estimated Creatinine Clearance: 27.5 mL/min (by C-G formula based on Cr of 2.66).   Medical History: Past Medical History  Diagnosis Date  . Diabetes mellitus   . Hypertension   . OSA on CPAP   . GERD (gastroesophageal reflux disease)   . History of prostate cancer 2010-- S/P  EXTERNAL RADIATION  . Hematuria   . Bladder calculi   . Hydronephrosis, left   . Dyspnea on exertion   . Arthritis HANDS/ THUMBS/ SHOULDERS  . Chronic obstructive asthma, unspecified FOLLOWED BY DR Tarri Fuller YOUNG  VISIT 10-14-2011 IN [facility]  . COPD (chronic obstructive pulmonary disease) (HCC)     Medications:  PTA xarelto starter pack prescribed 08/15/15 however pt did not pick up prescription from pharmacy due to insurance issues, told EDP he was receiving xarelto samples from PCP.  Verified with patient on 7/15 -- he completed xarelto dose pack and taking 20 mg daily PTA.  LD 7/12 @ 9AM.   Assessment: 53 yoM with PMHx prostate cancer, DM, HTN, OSA on CPAP, asthma, and COPD with recent dx acute DVT in May 2017 taking xarelto samples from PCP now presents for evaluation after multiple falls. Pharmacy consulted to start IV heparin as pt  has AKI.   Today, 10/07/2015: - heparin level 0.45, aPTT 92 --> therapeutic - hgb and plt down slightly - no bleeding documented - scr elevated but trending down (crcl~27) -2208 HL=0.36 , no problems reported by RN   Goal of Therapy:  Heparin level 0.3-0.7 units/ml  APTT 66-102 seconds Monitor platelets by anticoagulation protocol: Yes   Plan:  - continue heparin drip at 1400 units/hr - daily cbc/HL  Dorrene German 10/07/2015 10:51 PM

## 2015-10-07 NOTE — Progress Notes (Addendum)
PROGRESS NOTE  JUSITN FEI [id number] DOB: 25-Mar-1936 DOA: 10/06/2015 PCP: Rogers Blocker, MD   LOS: 1 day   Brief Narrative: 80 y.o. male with medical history significant of DM, HTN, OSA, recent DVT in leg in May on Xarelto, remote history of prostate cancer in 2006 with radiation, prolonged remission since then, admitted on 7/14 with weakness/falls and tiredness, found by family on the floor. No syncope/falls. Found to be septic  Assessment & Plan: Principal Problem:   Sepsis secondary to UTI [facility]) Active Problems:   DM2 (diabetes mellitus, type 2) ([place])   Obstructive sleep apnea   Essential hypertension   CKD (chronic kidney disease) stage 3, GFR 30-59 ml/min   AKI (acute kidney injury) ([place])   UTI (lower urinary tract infection)  Addendum 6 pm  Called by RN that patient is having increased WOB progressive in the past 1-2 hours. Evaluated patient ~ 4 pm (fluids discontinued at that time) and again ~ 6 pm. He is tachypneic and is complaining of shortness of breath especially when he moved out and back into bed. He has rested now for a while and remained dyspneic. Repeat CXR without significant acute findings. EKG / telemetry shows sinus rhythm however it appears that for few seconds he had MAT vs A fib, hard to determine. He is anticoagulated as below for recent DVT. ABG reviewed, mild respiratory alkalosis 7.45 / 25 / 132, no hypoxia. Patient has no chest pain / palpitations / lightheadedness / dizziness. His sepsis physiology seems to be resolved he is afebrile, his lactic acid normalized and he is normo - to hypertensive now.   Plan - diurese 40 IV Lasix now, wonder whether is he developing early overload - breathing tx with Xopenex - 2D echo in am  - monitor for clear A fib, if suspected a 12 lead EKG would be helpful    Sepsis secondary to UTI - sepsis physiology improving - cultures 1/2 bottles with GPC, add Vancomycin. Blood culture reflex pending - continue  Ceftriaxone for UTI - still febrile this morning   AKI on CKD 3 - monitor Cr, IVF - renal US with moderate right-sided pelvicaliectasis, unchanged from 2013.  DVT - recently diagnosed in May 2017 and on Xarelto since - given renal failure continue heparin infusion. Once Cr approaches baseline will switch back to Xarelto - there was concern in the ER regarding his breathing status and underwent a VQ scan which was low probability for PE.   Sore throat - complains this morning of a sore throat for the past 2 days, and pain with swallowing - no cough - rapid strep  DM - SSI  Hyperkalemia - in the setting of renal failure, improved after kayexalate - monitor  OSA - continue CPAP    DVT prophylaxis: heparin gtt Code Status: Full Family Communication: no family bedside Disposition Plan: transfer to floor at the end of the day if stable. Will reassess.  Consultants:   None   Procedures:   None   Antimicrobials:  Ceftriaxone 7/14 >>  Vancomycin 7/15 >>   Subjective: - no chest pain, shortness of breath, no abdominal pain, nausea or vomiting.  - not really sure about all events yesterday - sore throat  Objective: Filed Vitals:   10/07/15 0500 10/07/15 0600 10/07/15 0700 10/07/15 0800  BP: 150/101 146/65 130/67 138/67  Pulse: 103 101 97 100  Temp:    100.7 F (38.2 C)  TempSrc:    Oral  Resp: 23 25 23  26  Height:    5\' 10"  (1.778 m)  Weight:      SpO2: 98% 95% 96% 97%    Intake/Output Summary (Last 24 hours) at 10/07/15 0930 Last data filed at 10/07/15 0800  Gross per 24 hour  Intake 753.08 ml  Output      0 ml  Net 753.08 ml   Filed Weights   10/06/15 1811 10/07/15 0400  Weight: 107.956 kg (238 lb) 102.8 kg (226 lb 10.1 oz)    Examination: Constitutional: NAD Filed Vitals:   10/07/15 0500 10/07/15 0600 10/07/15 0700 10/07/15 0800  BP: 150/101 146/65 130/67 138/67  Pulse: 103 101 97 100  Temp:    100.7 F (38.2 C)  TempSrc:    Oral  Resp:  23 25 23 26   Height:    5\' 10"  (1.778 m)  Weight:      SpO2: 98% 95% 96% 97%   Eyes: PERRL, lids and conjunctivae normal ENMT: Mucous membranes are moist. Mild oropharyngeal erythema, could not appreciate exudates Neck: normal, supple, no masses, no thyromegaly Respiratory: clear to auscultation bilaterally, no wheezing, no crackles. Normal respiratory effort. No accessory muscle use.  Cardiovascular: Regular rate and rhythm, no murmurs / rubs / gallops. No LE edema. Abdomen: no tenderness. Bowel sounds positive.  Neurologic: non focal    Data Reviewed: I have personally reviewed following labs and imaging studies  CBC:  Recent Labs Lab 10/06/15 1516 10/07/15 0509  WBC 19.1* 15.7*  NEUTROABS 17.8*  --   HGB 14.8 12.5*  HCT 43.1 36.7*  MCV 91.3 91.5  PLT 225 0000000   Basic Metabolic Panel:  Recent Labs Lab 10/06/15 1516 10/07/15 0116 10/07/15 0509  NA 136  --  138  K 5.9* 4.4 4.5  CL 104  --  110  CO2 23  --  22  GLUCOSE 235*  --  200*  BUN 49*  --  52*  CREATININE 2.72*  --  2.66*  CALCIUM 10.8*  --  9.9   GFR: Estimated Creatinine Clearance: 27.5 mL/min (by C-G formula based on Cr of 2.66). Liver Function Tests: No results for input(s): AST, ALT, ALKPHOS, BILITOT, PROT, ALBUMIN in the last 168 hours. No results for input(s): LIPASE, AMYLASE in the last 168 hours. No results for input(s): AMMONIA in the last 168 hours. Coagulation Profile: No results for input(s): INR, PROTIME in the last 168 hours. Cardiac Enzymes: No results for input(s): CKTOTAL, CKMB, CKMBINDEX, TROPONINI in the last 168 hours. BNP (last 3 results) No results for input(s): PROBNP in the last 8760 hours. HbA1C: No results for input(s): HGBA1C in the last 72 hours. CBG:  Recent Labs Lab 10/06/15 2307 10/07/15 0422  GLUCAP 194* 212*   Lipid Profile: No results for input(s): CHOL, HDL, LDLCALC, TRIG, CHOLHDL, LDLDIRECT in the last 72 hours. Thyroid Function Tests: No results for  input(s): TSH, T4TOTAL, FREET4, T3FREE, THYROIDAB in the last 72 hours. Anemia Panel: No results for input(s): VITAMINB12, FOLATE, FERRITIN, TIBC, IRON, RETICCTPCT in the last 72 hours. Urine analysis:    Component Value Date/Time   COLORURINE AMBER* 10/06/2015 1811   APPEARANCEUR TURBID* 10/06/2015 1811   LABSPEC 1.018 10/06/2015 1811   PHURINE 5.5 10/06/2015 1811   GLUCOSEU NEGATIVE 10/06/2015 1811   HGBUR LARGE* 10/06/2015 1811   BILIRUBINUR NEGATIVE 10/06/2015 1811   KETONESUR NEGATIVE 10/06/2015 1811   PROTEINUR >300* 10/06/2015 1811   UROBILINOGEN 0.2 09/26/2012 1222   NITRITE POSITIVE* 10/06/2015 1811   LEUKOCYTESUR LARGE* 10/06/2015 1811  Sepsis Labs: Invalid input(s): PROCALCITONIN, LACTICIDVEN  Recent Results (from the past 240 hour(s))  Blood culture (routine x 2)     Status: None (Preliminary result)   Collection Time: 10/06/15  3:15 PM  Result Value Ref Range Status   Specimen Description BLOOD RIGHT ANTECUBITAL  Final   Special Requests BOTTLES DRAWN AEROBIC AND ANAEROBIC 5CC  Final   Culture  Setup Time   Final    GRAM POSITIVE COCCI IN CHAINS ANAEROBIC BOTTLE ONLY Organism ID to follow Performed at [facility]  Final   Report Status PENDING  Incomplete  MRSA PCR Screening     Status: None   Collection Time: 10/06/15 10:34 PM  Result Value Ref Range Status   MRSA by PCR NEGATIVE NEGATIVE Final    Comment:        The GeneXpert MRSA Assay (FDA approved for NASAL specimens only), is one component of a comprehensive MRSA colonization surveillance program. It is not intended to diagnose MRSA infection nor to guide or monitor treatment for MRSA infections.       Radiology Studies: Dg Chest 2 View  10/06/2015  CLINICAL DATA:  Status post fall. EXAM: CHEST  2 VIEW COMPARISON:  CT chest 01/14/2006 FINDINGS: The heart size and mediastinal contours are within normal limits. Both lungs are clear. The visualized  skeletal structures are unremarkable. IMPRESSION: No active cardiopulmonary disease. Electronically Signed   By: Kathreen Devoid   On: 10/06/2015 15:40   Ct Head Wo Contrast  10/06/2015  CLINICAL DATA:  Several recent falls EXAM: CT HEAD WITHOUT CONTRAST TECHNIQUE: Contiguous axial images were obtained from the base of the skull through the vertex without intravenous contrast. COMPARISON:  June 02, 2014 FINDINGS: Brain: There is mild diffuse atrophy. There is no intracranial mass, hemorrhage, extra-axial fluid collection, or midline shift. There is mild small vessel disease in the centra semiovale bilaterally. Elsewhere gray-white compartments are normal. No acute infarct evident. Vascular: There is calcification in each cavernous carotid artery. No hyperdense vessels are apparent. Skull: The bony calvarium appears intact. Sinuses/Orbits: Orbits appear symmetric bilaterally. Visualized paranasal sinuses are clear. Other: Mastoid air cells are clear. IMPRESSION: Atrophy with mild periventricular small vessel disease. No intracranial mass, hemorrhage, or extra-axial fluid collection. No acute infarct evident. There are foci of arterial vascular calcification in the cavernous carotid arteries. Electronically Signed   By: Lowella Grip III M.D.   On: 10/06/2015 16:19   US Renal  10/07/2015  CLINICAL DATA:  80 year old male with a history of acute kidney injury EXAM: RENAL / URINARY TRACT ULTRASOUND COMPLETE COMPARISON:  CT 10/15/2011 FINDINGS: Right Kidney: Length: 14.0 cm.  Moderate right-sided pelvicaliectasis. Anechoic structure on the superior cortex, measuring 3.3 cm x 3.4 cm x 3.3 cm. Posterior acoustic enhancement. Adjacent cystic lesion measures 2.2 cm x 2.0 cm x 2.1 cm. Posterior acoustic enhancement. Left Kidney: Length: 11.6 cm. Asymmetric cortical thinning of the left kidney compared to the right. Dilation of the collecting system, with mild pelvicaliectasis. Multilobulated anechoic cystic structure  at the inferior left kidney measures 5.2 cm x 4.7 cm x 4.5 cm Additional anechoic cystic structure of the left kidney measures 2.0 cm x 2.4 cm x 1.9 cm Bladder: Urinary distention of the bladder. Right urinary jet identified. Debris within the dependent aspects of the urinary bladder, was present on comparison CT study. IMPRESSION: Right: Moderate right-sided pelvicaliectasis, which is essentially persistent from the CT of 2013. A right ureteral jet is  identified within the urinary bladder. Right-sided kidney cysts, most likely Bosniak 1 cysts. Left: Asymmetric cortical thinning of the left kidney, indicating chronic medical renal disease. This cortical thinning was present on the CT of 2013 when there was significant hydronephrosis/ pelvicaliectasis. Current ultrasound survey demonstrates persistent hydronephrosis/pelvicaliectasis. No left-sided ureteral jet identified on the current study. Bladder stones/debris at the dependent bladder, again unchanged from comparison CT. Cystic changes on the left may represent dilated calyx and/or Bosniak 1 cysts. Signed, Dulcy Fanny. Earleen Newport, DO [facility] Electronically Signed   By: Corrie Mckusick D.O.   On: 10/07/2015 08:31   Nm Pulmonary Perf And Vent  10/07/2015  CLINICAL DATA:  80 year old male with DVT in the left neck. No shortness of breath. No chest pain. EXAM: NUCLEAR MEDICINE VENTILATION - PERFUSION LUNG SCAN TECHNIQUE: Ventilation images were obtained in multiple projections using inhaled aerosol Tc-68m DTPA. Perfusion images were obtained in multiple projections after intravenous injection of Tc-27m MAA. RADIOPHARMACEUTICALS:  31 mCi Technetium-27m DTPA aerosol inhalation and 4.0 mCi Technetium-29m MAA IV COMPARISON:  Chest radiograph dated 01/06/2016 FINDINGS: Ventilation: There is heterogeneous uptake with areas of central deposition of the radiotracer compatible with COPD changes. There is a segmental area  of defect in the superior segment of the left lower lobe. An area of defect is also noted in the right apical region. Perfusion: There is a segmental defect in the superior segment of the left lower lobe which appears matched to ventilation defect or smaller than the ventilation defect. No other perfusion defect identified. Overall there is better profusion of the lungs compared to the ventilation images. IMPRESSION: Low probability for pulmonary embolism. Electronically Signed   By: Anner Crete M.D.   On: 10/07/2015 00:27     Scheduled Meds: . amitriptyline  10 mg Oral QHS  . calcium carbonate  1,250 mg Oral Q breakfast  . cefTRIAXone (ROCEPHIN)  IV  1 g Intravenous Q24H  . diltiazem  240 mg Oral Daily  . gabapentin  100 mg Oral Daily  . insulin aspart  0-9 Units Subcutaneous Q4H  . QUEtiapine  25 mg Oral QHS   Continuous Infusions: . sodium chloride 100 mL/hr at 10/07/15 0815  . sodium chloride 75 mL/hr at 10/07/15 0400  . heparin 1,400 Units/hr (10/07/15 [id number])     Marzetta Board, MD, PhD [facility] Pager [phone]  If 7PM-7AM, please contact night-coverage [email or web address] Password [email or web address] 10/07/2015, 9:30 AM

## 2015-10-07 NOTE — Progress Notes (Addendum)
As the day has progressed, pt seems short of breath, does complain of exertional shortness of breath as PT got patient OOB to chair. VSS. Saturation 98% on room air. Dr. Cruzita Lederer paged and orders placed.

## 2015-10-07 NOTE — Progress Notes (Signed)
Patient assisted back to bed with 2 staff assist and walker - pt extremely dyspneic on exertion. Increased work of breathing. Respiratory rate 28-30. Denies pain but states SHOB. Bedside monitor with possible new onset Atrial fibrillation 105-110. Dr. Cruzita Lederer paged, states he will see patient. EKG obtained. RT to obtain ABG.

## 2015-10-07 NOTE — Progress Notes (Signed)
Pharmacy Antibiotic Note  Roy [name] is a 80 y.o. male with hx DVT and  CKD 3 presented to the ED on 7/14 with c/o multiple falls PTA.  He was subsequently diagnosed with sepsis secondary to UTI.  Vancomycin 1500 mg and zosyn 3.375 gm x1 dose given in the ED.  Abx then changed to ceftriaxone 1 gm IV q24h for UTI when patient got admitted.  1/2 Bcx now with GPC in chains.  To resume vancomycin back for r/o bacteremia.  - 7/14 CXR: no active dz - Tmax 102.2, wbc down 15.7; CKD 3, scr down 2.66 (crcl~23 N)  Plan: - vancomycin 1500 mg IV q48h (next dose on 7/16 at 1800) - ceftriaxone 1 gm IV q24h per MD - f/u cultures, renal function  _______________________  Height: 5\' 10"  (177.8 cm) Weight: 226 lb 10.1 oz (102.8 kg) IBW/kg (Calculated) : 73  Temp (24hrs), Avg:100.3 F (37.9 C), Min:98.9 F (37.2 C), Max:102.2 F (39 C)   Recent Labs Lab 10/06/15 1516 10/06/15 1641 10/06/15 1952 10/07/15 0509  WBC 19.1*  --   --  15.7*  CREATININE 2.72*  --   --  2.66*  LATICACIDVEN  --  2.26* 1.56  --     Estimated Creatinine Clearance: 27.5 mL/min (by C-G formula based on Cr of 2.66).    No Known Allergies  Antimicrobials this admission: 7/14 Vanc x 1>> resume 7/15>> 7/14 Zosyn x 1 7/14 CTX (for UTI)>>  Dose adjustments this admission: n/a  Microbiology results: 7/14 BCx x2: 1/2 GPC in chains (in aaerobic bottle) 7/01 MRSA PCR  Thank you for allowing pharmacy to be a part of this patient's care.  Lynelle Doctor 10/07/2015 10:05 AM

## 2015-10-07 NOTE — Progress Notes (Signed)
ANTICOAGULATION CONSULT NOTE - Initial Consult  Pharmacy Consult for Heparin Indication: VTE treatment   No Known Allergies  Patient Measurements: Height: 5\' 10"  (177.8 cm) Weight: 226 lb 10.1 oz (102.8 kg) IBW/kg (Calculated) : 73 Heparin Dosing Weight: 96.3kg  Vital Signs: Temp: 97.8 F (36.6 C) (07/15 1200) Temp Source: Oral (07/15 1200) BP: 126/74 mmHg (07/15 1400) Pulse Rate: 105 (07/15 1500)  Labs:  Recent Labs  10/06/15 1516 10/06/15 2018 10/07/15 0509 10/07/15 1418  HGB 14.8  --  12.5*  --   HCT 43.1  --  36.7*  --   PLT 225  --  193  --   APTT  --  44* 125* 92*  HEPARINUNFRC  --  1.02* 0.75* 0.45  CREATININE 2.72*  --  2.66*  --     Estimated Creatinine Clearance: 27.5 mL/min (by C-G formula based on Cr of 2.66).   Medical History: Past Medical History  Diagnosis Date  . Diabetes mellitus   . Hypertension   . OSA on CPAP   . GERD (gastroesophageal reflux disease)   . History of prostate cancer 2010-- S/P  EXTERNAL RADIATION  . Hematuria   . Bladder calculi   . Hydronephrosis, left   . Dyspnea on exertion   . Arthritis HANDS/ THUMBS/ SHOULDERS  . Chronic obstructive asthma, unspecified FOLLOWED BY DR Tarri Fuller YOUNG  VISIT 10-14-2011 IN [facility]  . COPD (chronic obstructive pulmonary disease) ([facility])     Medications:  PTA xarelto starter pack prescribed 08/15/15 however pt did not pick up prescription from pharmacy due to insurance issues, told EDP he was receiving xarelto samples from PCP.  Verified with patient on 7/15 -- he completed xarelto dose pack and taking 20 mg daily PTA.  LD 7/12 @ 9AM.   Assessment: 79 yoM with PMHx prostate cancer, DM, HTN, OSA on CPAP, asthma, and COPD with recent dx acute DVT in May 2017 taking xarelto samples from PCP now presents for evaluation after multiple falls. Pharmacy consulted to start IV heparin as pt has AKI.   Today, 10/07/2015: - heparin level 0.45, aPTT 92 --> therapeutic - hgb and plt down slightly - no  bleeding documented - scr elevated but trending down (crcl~27)   Goal of Therapy:  Heparin level 0.3-0.7 units/ml  APTT 66-102 seconds Monitor platelets by anticoagulation protocol: Yes   Plan:  - continue heparin drip at 1400 units/hr - with both heparin level and aPTT within therapeutic range, will change to heparin level monitoring only  - recheck heparin level at 10p to confirm before changing to monitoring daily  Dia Sitter, PharmD, BCPS 10/07/2015 3:10 PM

## 2015-10-08 ENCOUNTER — Inpatient Hospital Stay (HOSPITAL_COMMUNITY): Payer: Medicare Other

## 2015-10-08 ENCOUNTER — Other Ambulatory Visit: Payer: Self-pay

## 2015-10-08 LAB — CBC
HCT: 35.6 % — ABNORMAL LOW (ref 39.0–52.0)
Hemoglobin: 12.2 g/dL — ABNORMAL LOW (ref 13.0–17.0)
MCH: 31.2 pg (ref 26.0–34.0)
MCHC: 34.3 g/dL (ref 30.0–36.0)
MCV: 91 fL (ref 78.0–100.0)
Platelets: 190 10*3/uL (ref 150–400)
RBC: 3.91 MIL/uL — AB (ref 4.22–5.81)
RDW: 14.6 % (ref 11.5–15.5)
WBC: 12.9 10*3/uL — AB (ref 4.0–10.5)

## 2015-10-08 LAB — BASIC METABOLIC PANEL
Anion gap: 7 (ref 5–15)
BUN: 56 mg/dL — AB (ref 6–20)
CO2: 20 mmol/L — ABNORMAL LOW (ref 22–32)
CREATININE: 2.76 mg/dL — AB (ref 0.61–1.24)
Calcium: 9.9 mg/dL (ref 8.9–10.3)
Chloride: 109 mmol/L (ref 101–111)
GFR, EST AFRICAN AMERICAN: 24 mL/min — AB (ref >60)
GFR, EST NON AFRICAN AMERICAN: 20 mL/min — AB (ref >60)
Glucose, Bld: 85 mg/dL (ref 65–99)
Potassium: 3.6 mmol/L (ref 3.5–5.1)
SODIUM: 136 mmol/L (ref 135–145)

## 2015-10-08 LAB — GLUCOSE, CAPILLARY: GLUCOSE-CAPILLARY: 87 mg/dL (ref 65–99)

## 2015-10-08 LAB — HEPARIN LEVEL (UNFRACTIONATED): HEPARIN UNFRACTIONATED: 0.3 [IU]/mL (ref 0.30–0.70)

## 2015-10-08 LAB — ECHOCARDIOGRAM COMPLETE
Height: 70 in
Weight: 3626.13 oz

## 2015-10-08 MED ORDER — DEXTROSE 5 % IV SOLN
5.0000 mg/h | INTRAVENOUS | Status: DC
  Administered 2015-10-08 – 2015-10-09 (×2): 5 mg/h via INTRAVENOUS
  Filled 2015-10-08 (×2): qty 100

## 2015-10-08 MED ORDER — DILTIAZEM LOAD VIA INFUSION
10.0000 mg | Freq: Once | INTRAVENOUS | Status: AC
  Administered 2015-10-08: 10 mg via INTRAVENOUS
  Filled 2015-10-08: qty 10

## 2015-10-08 NOTE — Progress Notes (Signed)
Physical Therapy Treatment Patient Details Name: Roy [name] MRN: [id number] DOB: 11-25-1935 Today's Date: 10/08/2015    History of Present Illness 80 y.o. male with medical history significant of DM, HTN, OSA, recent DVT in leg in May on Xarelto, remote history of prostate cancer in 2006 with radiation, prolonged remission since then, admitted on 7/14 with weakness/falls and tiredness, found by family on the floor. No syncope/falls. Found to be septic    PT Comments    Progressing well; activity tol improved but still limited by PT (not pt) this date d/t afib with RVR earlier today; now in NSR  Follow Up Recommendations  Home health PT;Supervision - Intermittent     Equipment Recommendations  Rolling walker with 5" wheels    Recommendations for Other Services       Precautions / Restrictions Precautions Precautions: Fall Precaution Comments: afib  with ARVR earlier today     Mobility  Bed Mobility Overal bed mobility: Needs Assistance Bed Mobility: Supine to Sit     Supine to sit: Min assist     General bed mobility comments: assist to bring trunk upright, incr time  Transfers Overall transfer level: Needs assistance Equipment used: Rolling walker (2 wheeled) Transfers: Sit to/from Stand Sit to Stand: Min assist         General transfer comment: pt encouraged to transition slowly to monitor for afib/RVR--min-min/guard for to rise and steady once in standing  Ambulation/Gait Ambulation/Gait assistance: Min guard;Min assist Ambulation Distance (Feet): 8 Feet Assistive device: Rolling walker (2 wheeled) Gait Pattern/deviations: Step-through pattern;Decreased stride length Gait velocity: HR 81--89--82; RR 28--33--29; sats 94%; pt is less dyspneic today, HR stable; RN aware   General Gait Details: cues for breathing, pace, RW position;  pt is less dyspneic today   Stairs            Wheelchair Mobility    Modified Rankin (Stroke Patients Only)        Balance Overall balance assessment: Needs assistance   Sitting balance-Leahy Scale: Fair       Standing balance-Leahy Scale: Poor Standing balance comment: lightly reliant on UEs for balance                    Cognition Arousal/Alertness: Awake/alert Behavior During Therapy: WFL for tasks assessed/performed Overall Cognitive Status: Within Functional Limits for tasks assessed                      Exercises      General Comments        Pertinent Vitals/Pain Pain Assessment: No/denies pain    Home Living                      Prior Function            PT Goals (current goals can now be found in the care plan section) Acute Rehab PT Goals Patient Stated Goal: home, feel better PT Goal Formulation: With patient Time For Goal Achievement: 10/14/15 Potential to Achieve Goals: Good Progress towards PT goals: Progressing toward goals    Frequency  Min 3X/week    PT Plan Current plan remains appropriate    Co-evaluation             End of Session Equipment Utilized During Treatment: Gait belt Activity Tolerance: Patient tolerated treatment well Patient left: in chair;with call bell/phone within reach;with chair alarm set;with family/visitor present     Time: [id number] PT Time Calculation (min) (  ACUTE ONLY): 20 min  Charges:  $Therapeutic Activity: 8-22 mins                    G Codes:      Jahniyah Revere 2015-10-12, 2:20 PM

## 2015-10-08 NOTE — Progress Notes (Addendum)
PROGRESS NOTE  Roy [name] [id number] DOB: [birth year] DOA: 10/06/2015 PCP: Rogers Blocker, MD   LOS: 2 days   Brief Narrative: 80 y.o. male with medical history significant of DM, HTN, OSA, recent DVT in leg in May on Xarelto, remote history of prostate cancer in 2006 with radiation, prolonged remission since then, admitted on 7/14 with weakness/falls and tiredness, found by family on the floor. No syncope/falls. Found to be septic  Assessment & Plan: Principal Problem:   Sepsis secondary to UTI [facility]) Active Problems:   DM2 (diabetes mellitus, type 2) ([place])   Obstructive sleep apnea   Essential hypertension   CKD (chronic kidney disease) stage 3, GFR 30-59 ml/min   AKI (acute kidney injury) ([place])   UTI (lower urinary tract infection)  A fib with RVR - telemetry more clear today for A fib.  - will get a 2D echo, pending - Cardizem gtt for now - likely precipitated by infectious process - on heparin  Sepsis secondary to UTI - sepsis physiology improving - cultures 1/2 bottles with GPC, add Vancomycin. Blood culture reflex pending - continue Ceftriaxone for UTI. Cultures appears now cancelled by micro ???? - afebrile now  AKI on CKD 3 - monitor Cr - initially received IVF however became tachypneic and SOB 7/15 needing Lasix. Cr this morning slightly worse but overall stable - renal US with moderate right-sided pelvicaliectasis, unchanged from 2013. If no improvement in renal function will touch base with urology in am  DVT - recently diagnosed in May 2017 and on Xarelto since - given renal failure continue heparin infusion. Once Cr approaches baseline will switch back to Xarelto - there was concern in the ER regarding his breathing status and underwent a VQ scan which was low probability for PE.   Sore throat - complains this morning of a sore throat for the past 2 days, and pain with swallowing - no cough - rapid strep  DM - SSI  Hyperkalemia - in the setting  of renal failure, improved after kayexalate - monitor  OSA - continue CPAP    DVT prophylaxis: heparin gtt Code Status: Full Family Communication: no family bedside Disposition Plan: remain in SDU  Consultants:   None   Procedures:   None   Antimicrobials:  Ceftriaxone 7/14 >>  Vancomycin 7/15 >>   Subjective: - complains of shortness of breath this morning. No chest pain  Objective: Filed Vitals:   10/08/15 0600 10/08/15 0800 10/08/15 0900 10/08/15 1000  BP: 150/86 177/159 136/86 135/62  Pulse: 130 129 115 121  Temp:  99 F (37.2 C)    TempSrc:  Oral    Resp: 26 23 28 28   Height:      Weight:      SpO2: 93% 95% 96% 94%    Intake/Output Summary (Last 24 hours) at 10/08/15 1035 Last data filed at 10/08/15 1000  Gross per 24 hour  Intake   1493 ml  Output   1600 ml  Net   -107 ml   Filed Weights   10/06/15 1811 10/07/15 0400  Weight: 107.956 kg (238 lb) 102.8 kg (226 lb 10.1 oz)    Examination: Constitutional: NAD Filed Vitals:   10/08/15 0600 10/08/15 0800 10/08/15 0900 10/08/15 1000  BP: 150/86 177/159 136/86 135/62  Pulse: 130 129 115 121  Temp:  99 F (37.2 C)    TempSrc:  Oral    Resp: 26 23 28 28   Height:      Weight:  SpO2: 93% 95% 96% 94%   Eyes: PERRL, lids and conjunctivae normal ENMT: Mucous membranes are moist. Mild oropharyngeal erythema, could not appreciate exudates Neck: normal, supple, no masses, no thyromegaly Respiratory: clear to auscultation bilaterally, no wheezing, no crackles. Normal respiratory effort. No accessory muscle use.  Cardiovascular: irregular, tachycardic, no murmurs / rubs / gallops. No LE edema. Abdomen: no tenderness. Bowel sounds positive.  Neurologic: non focal    Data Reviewed: I have personally reviewed following labs and imaging studies  CBC:  Recent Labs Lab 10/06/15 1516 10/07/15 0509 10/08/15 0329  WBC 19.1* 15.7* 12.9*  NEUTROABS 17.8*  --   --   HGB 14.8 12.5* 12.2*  HCT 43.1  36.7* 35.6*  MCV 91.3 91.5 91.0  PLT 225 193 [id number]   Basic Metabolic Panel:  Recent Labs Lab 10/06/15 1516 10/07/15 0116 10/07/15 0509 10/08/15 0329  NA 136  --  138 136  K 5.9* 4.4 4.5 3.6  CL 104  --  110 109  CO2 23  --  22 20*  GLUCOSE 235*  --  200* 85  BUN 49*  --  52* 56*  CREATININE 2.72*  --  2.66* 2.76*  CALCIUM 10.8*  --  9.9 9.9   GFR: Estimated Creatinine Clearance: 26.5 mL/min (by C-G formula based on Cr of 2.76). Liver Function Tests: No results for input(s): AST, ALT, ALKPHOS, BILITOT, PROT, ALBUMIN in the last 168 hours. No results for input(s): LIPASE, AMYLASE in the last 168 hours. No results for input(s): AMMONIA in the last 168 hours. Coagulation Profile: No results for input(s): INR, PROTIME in the last 168 hours. Cardiac Enzymes: No results for input(s): CKTOTAL, CKMB, CKMBINDEX, TROPONINI in the last 168 hours. BNP (last 3 results) No results for input(s): PROBNP in the last 8760 hours. HbA1C: No results for input(s): HGBA1C in the last 72 hours. CBG:  Recent Labs Lab 10/06/15 2307 10/07/15 0422 10/07/15 0811 10/07/15 1645 10/07/15 2035  GLUCAP 194* 212* 159* 202* 145*   Lipid Profile: No results for input(s): CHOL, HDL, LDLCALC, TRIG, CHOLHDL, LDLDIRECT in the last 72 hours. Thyroid Function Tests: No results for input(s): TSH, T4TOTAL, FREET4, T3FREE, THYROIDAB in the last 72 hours. Anemia Panel: No results for input(s): VITAMINB12, FOLATE, FERRITIN, TIBC, IRON, RETICCTPCT in the last 72 hours. Urine analysis:    Component Value Date/Time   COLORURINE AMBER* 10/06/2015 1811   APPEARANCEUR TURBID* 10/06/2015 1811   LABSPEC 1.018 10/06/2015 1811   PHURINE 5.5 10/06/2015 1811   GLUCOSEU NEGATIVE 10/06/2015 1811   HGBUR LARGE* 10/06/2015 1811   BILIRUBINUR NEGATIVE 10/06/2015 1811   KETONESUR NEGATIVE 10/06/2015 1811   PROTEINUR >300* 10/06/2015 1811   UROBILINOGEN 0.2 09/26/2012 1222   NITRITE POSITIVE* 10/06/2015 1811    LEUKOCYTESUR LARGE* 10/06/2015 1811   Sepsis Labs: Invalid input(s): PROCALCITONIN, LACTICIDVEN  Recent Results (from the past 240 hour(s))  Blood culture (routine x 2)     Status: None (Preliminary result)   Collection Time: 10/06/15  3:15 PM  Result Value Ref Range Status   Specimen Description BLOOD RIGHT ANTECUBITAL  Final   Special Requests BOTTLES DRAWN AEROBIC AND ANAEROBIC 5CC  Final   Culture  Setup Time   Final    GRAM POSITIVE COCCI IN CHAINS ANAEROBIC BOTTLE ONLY CRITICAL RESULT CALLED TO, READ BACK BY AND VERIFIED WITH: K. HASKINS RN AT [id number] 10/07/15 BY Rush Landmark Performed at [place]  Final   Report Status PENDING  Incomplete  Blood culture (routine x 2)     Status: None (Preliminary result)   Collection Time: 10/06/15  4:33 PM  Result Value Ref Range Status   Specimen Description BLOOD LEFT HAND  Final   Special Requests IN PEDIATRIC BOTTLE 2.5CC  Final   Culture   Final    NO GROWTH < 24 HOURS Performed at [facility]    Report Status PENDING  Incomplete  MRSA PCR Screening     Status: None   Collection Time: 10/06/15 10:34 PM  Result Value Ref Range Status   MRSA by PCR NEGATIVE NEGATIVE Final    Comment:        The GeneXpert MRSA Assay (FDA approved for NASAL specimens only), is one component of a comprehensive MRSA colonization surveillance program. It is not intended to diagnose MRSA infection nor to guide or monitor treatment for MRSA infections.   Rapid strep screen (not at [facility])     Status: None   Collection Time: 10/07/15  9:45 AM  Result Value Ref Range Status   Streptococcus, Group A Screen (Direct) NEGATIVE NEGATIVE Final    Comment: (NOTE) A Rapid Antigen test may result negative if the antigen level in the sample is below the detection level of this test. The FDA has not cleared this test as a stand-alone test therefore the rapid antigen negative result has reflexed to a Group A Strep  culture.       Radiology Studies: Dg Chest 2 View  10/06/2015  CLINICAL DATA:  Status post fall. EXAM: CHEST  2 VIEW COMPARISON:  CT chest 01/14/2006 FINDINGS: The heart size and mediastinal contours are within normal limits. Both lungs are clear. The visualized skeletal structures are unremarkable. IMPRESSION: No active cardiopulmonary disease. Electronically Signed   By: Kathreen Devoid   On: 10/06/2015 15:40   Ct Head Wo Contrast  10/06/2015  CLINICAL DATA:  Several recent falls EXAM: CT HEAD WITHOUT CONTRAST TECHNIQUE: Contiguous axial images were obtained from the base of the skull through the vertex without intravenous contrast. COMPARISON:  June 02, 2014 FINDINGS: Brain: There is mild diffuse atrophy. There is no intracranial mass, hemorrhage, extra-axial fluid collection, or midline shift. There is mild small vessel disease in the centra semiovale bilaterally. Elsewhere gray-white compartments are normal. No acute infarct evident. Vascular: There is calcification in each cavernous carotid artery. No hyperdense vessels are apparent. Skull: The bony calvarium appears intact. Sinuses/Orbits: Orbits appear symmetric bilaterally. Visualized paranasal sinuses are clear. Other: Mastoid air cells are clear. IMPRESSION: Atrophy with mild periventricular small vessel disease. No intracranial mass, hemorrhage, or extra-axial fluid collection. No acute infarct evident. There are foci of arterial vascular calcification in the cavernous carotid arteries. Electronically Signed   By: Lowella Grip III M.D.   On: 10/06/2015 16:19   US Renal  10/07/2015  CLINICAL DATA:  80 year old male with a history of acute kidney injury EXAM: RENAL / URINARY TRACT ULTRASOUND COMPLETE COMPARISON:  CT 10/15/2011 FINDINGS: Right Kidney: Length: 14.0 cm.  Moderate right-sided pelvicaliectasis. Anechoic structure on the superior cortex, measuring 3.3 cm x 3.4 cm x 3.3 cm. Posterior acoustic enhancement. Adjacent cystic lesion  measures 2.2 cm x 2.0 cm x 2.1 cm. Posterior acoustic enhancement. Left Kidney: Length: 11.6 cm. Asymmetric cortical thinning of the left kidney compared to the right. Dilation of the collecting system, with mild pelvicaliectasis. Multilobulated anechoic cystic structure at the inferior left kidney measures 5.2 cm x 4.7 cm x 4.5 cm Additional anechoic cystic structure of the  left kidney measures 2.0 cm x 2.4 cm x 1.9 cm Bladder: Urinary distention of the bladder. Right urinary jet identified. Debris within the dependent aspects of the urinary bladder, was present on comparison CT study. IMPRESSION: Right: Moderate right-sided pelvicaliectasis, which is essentially persistent from the CT of 2013. A right ureteral jet is identified within the urinary bladder. Right-sided kidney cysts, most likely Bosniak 1 cysts. Left: Asymmetric cortical thinning of the left kidney, indicating chronic medical renal disease. This cortical thinning was present on the CT of 2013 when there was significant hydronephrosis/ pelvicaliectasis. Current ultrasound survey demonstrates persistent hydronephrosis/pelvicaliectasis. No left-sided ureteral jet identified on the current study. Bladder stones/debris at the dependent bladder, again unchanged from comparison CT. Cystic changes on the left may represent dilated calyx and/or Bosniak 1 cysts. Signed, Dulcy Fanny. Earleen Newport, DO [facility] Radiology Electronically Signed   By: Corrie Mckusick D.O.   On: 10/07/2015 08:31   Nm Pulmonary Perf And Vent  10/07/2015  CLINICAL DATA:  80 year old male with DVT in the left neck. No shortness of breath. No chest pain. EXAM: NUCLEAR MEDICINE VENTILATION - PERFUSION LUNG SCAN TECHNIQUE: Ventilation images were obtained in multiple projections using inhaled aerosol Tc-46m DTPA. Perfusion images were obtained in multiple projections after intravenous injection of Tc-50m MAA. RADIOPHARMACEUTICALS:  31 mCi  Technetium-86m DTPA aerosol inhalation and 4.0 mCi Technetium-29m MAA IV COMPARISON:  Chest radiograph dated 01/06/2016 FINDINGS: Ventilation: There is heterogeneous uptake with areas of central deposition of the radiotracer compatible with COPD changes. There is a segmental area of defect in the superior segment of the left lower lobe. An area of defect is also noted in the right apical region. Perfusion: There is a segmental defect in the superior segment of the left lower lobe which appears matched to ventilation defect or smaller than the ventilation defect. No other perfusion defect identified. Overall there is better profusion of the lungs compared to the ventilation images. IMPRESSION: Low probability for pulmonary embolism. Electronically Signed   By: Anner Crete M.D.   On: 10/07/2015 00:27   Dg Chest Port 1 View  10/07/2015  CLINICAL DATA:  80 year old male with a history of dyspnea EXAM: PORTABLE CHEST 1 VIEW COMPARISON:  10/06/2015, CT 01/14/2006 FINDINGS: Cardiomediastinal silhouette unchanged. No pneumothorax or pleural effusion. No confluent airspace disease. Linear opacities at the lung bases compatible of atelectasis. IMPRESSION: No radiographic evidence of acute cardiopulmonary disease Signed, Dulcy Fanny. Earleen Newport, DO [facility] Radiology Electronically Signed   By: Corrie Mckusick D.O.   On: 10/07/2015 15:58     Scheduled Meds: . amitriptyline  10 mg Oral QHS  . calcium carbonate  1,250 mg Oral Q breakfast  . cefTRIAXone (ROCEPHIN)  IV  1 g Intravenous Q24H  . diltiazem  10 mg Intravenous Once  . gabapentin  100 mg Oral Daily  . insulin aspart  0-9 Units Subcutaneous Q4H  . QUEtiapine  25 mg Oral QHS  . vancomycin  1,500 mg Intravenous Q48H   Continuous Infusions: . diltiazem (CARDIZEM) infusion    . heparin 1,450 Units/hr (10/08/15 0747)     Marzetta Board, MD, PhD [facility] Pager [phone]  If 7PM-7AM, please  contact night-coverage [email or web address] Password [email or web address] 10/08/2015, 10:35 AM

## 2015-10-08 NOTE — Progress Notes (Signed)
ANTICOAGULATION CONSULT NOTE - Initial Consult  Pharmacy Consult for Heparin Indication: Hx DVT (home xarelto on hold)  No Known Allergies  Patient Measurements: Height: 5\' 10"  (177.8 cm) Weight: 226 lb 10.1 oz (102.8 kg) IBW/kg (Calculated) : 73 Heparin Dosing Weight: 96.3kg  Vital Signs: Temp: 99.2 F (37.3 C) (07/16 0400) Temp Source: Oral (07/16 0400) BP: 150/86 mmHg (07/16 0600) Pulse Rate: 130 (07/16 0600)  Labs:  Recent Labs  10/06/15 1516  10/06/15 2018 10/07/15 0509 10/07/15 1418 10/07/15 2208 10/08/15 0329  HGB 14.8  --   --  12.5*  --   --  12.2*  HCT 43.1  --   --  36.7*  --   --  35.6*  PLT 225  --   --  193  --   --  190  APTT  --   --  44* 125* 92*  --   --   HEPARINUNFRC  --   < > 1.02* 0.75* 0.45 0.36 0.30  CREATININE 2.72*  --   --  2.66*  --   --  2.76*  < > = values in this interval not displayed.  Estimated Creatinine Clearance: 26.5 mL/min (by C-G formula based on Cr of 2.76).   Medical History: Past Medical History  Diagnosis Date  . Diabetes mellitus   . Hypertension   . OSA on CPAP   . GERD (gastroesophageal reflux disease)   . History of prostate cancer 2010-- S/P  EXTERNAL RADIATION  . Hematuria   . Bladder calculi   . Hydronephrosis, left   . Dyspnea on exertion   . Arthritis HANDS/ THUMBS/ SHOULDERS  . Chronic obstructive asthma, unspecified FOLLOWED BY DR Tarri Fuller YOUNG  VISIT 10-14-2011 IN [facility]  . COPD (chronic obstructive pulmonary disease) ([facility])     Medications:  PTA xarelto starter pack prescribed 08/15/15 however pt did not pick up prescription from pharmacy due to insurance issues, told EDP he was receiving xarelto samples from PCP.  Verified with patient on 7/15 -- he completed xarelto dose pack and taking 20 mg daily PTA.  LD 7/12 @ 9AM.   Assessment: 87 yoM with PMHx prostate cancer, DM, HTN, OSA on CPAP, asthma, and COPD with recent dx acute DVT in May 2017 taking xarelto samples from PCP now presents for evaluation  after multiple falls. Pharmacy consulted to start IV heparin as pt has AKI.   Today, 10/08/2015: - heparin level is therapeutic at 0.30 but is at lower end of therapeutic range - hgb and plt down slightly - no bleeding documented - scr elevated and up slightly today (crcl~22)   Goal of Therapy:  Heparin level 0.3-0.7 units/ml  Monitor platelets by anticoagulation protocol: Yes   Plan:  - Increase heparin drip slightly to 1450 units/hr - monitor for s/s bleeding   Dia Sitter, PharmD, BCPS 10/08/2015 7:44 AM

## 2015-10-08 NOTE — Progress Notes (Signed)
  Echocardiogram 2D Echocardiogram has been performed.  Diamond Nickel 10/08/2015, 12:27 PM

## 2015-10-09 DIAGNOSIS — A4181 Sepsis due to Enterococcus: Principal | ICD-10-CM

## 2015-10-09 DIAGNOSIS — N179 Acute kidney failure, unspecified: Secondary | ICD-10-CM

## 2015-10-09 DIAGNOSIS — B952 Enterococcus as the cause of diseases classified elsewhere: Secondary | ICD-10-CM

## 2015-10-09 DIAGNOSIS — N390 Urinary tract infection, site not specified: Secondary | ICD-10-CM

## 2015-10-09 DIAGNOSIS — K5900 Constipation, unspecified: Secondary | ICD-10-CM

## 2015-10-09 DIAGNOSIS — R7881 Bacteremia: Secondary | ICD-10-CM

## 2015-10-09 DIAGNOSIS — R6520 Severe sepsis without septic shock: Secondary | ICD-10-CM

## 2015-10-09 HISTORY — DX: Bacteremia: B95.2

## 2015-10-09 LAB — GLUCOSE, CAPILLARY
GLUCOSE-CAPILLARY: 60 mg/dL — AB (ref 65–99)
GLUCOSE-CAPILLARY: 92 mg/dL (ref 65–99)
GLUCOSE-CAPILLARY: 121 mg/dL — AB (ref 65–99)
GLUCOSE-CAPILLARY: 153 mg/dL — AB (ref 65–99)
GLUCOSE-CAPILLARY: 92 mg/dL (ref 65–99)
GLUCOSE-CAPILLARY: 95 mg/dL (ref 65–99)
GLUCOSE-CAPILLARY: 154 mg/dL — AB (ref 65–99)
GLUCOSE-CAPILLARY: 124 mg/dL — AB (ref 65–99)
GLUCOSE-CAPILLARY: 184 mg/dL — AB (ref 65–99)
Glucose-Capillary: 112 mg/dL — ABNORMAL HIGH (ref 65–99)
Glucose-Capillary: 137 mg/dL — ABNORMAL HIGH (ref 65–99)
Glucose-Capillary: 61 mg/dL — ABNORMAL LOW (ref 65–99)
Glucose-Capillary: 91 mg/dL (ref 65–99)
Glucose-Capillary: 91 mg/dL (ref 65–99)

## 2015-10-09 LAB — CBC
HEMATOCRIT: 32.4 % — AB (ref 39.0–52.0)
Hemoglobin: 10.9 g/dL — ABNORMAL LOW (ref 13.0–17.0)
MCH: 30.4 pg (ref 26.0–34.0)
MCHC: 33.6 g/dL (ref 30.0–36.0)
MCV: 90.5 fL (ref 78.0–100.0)
Platelets: 184 10*3/uL (ref 150–400)
RBC: 3.58 MIL/uL — ABNORMAL LOW (ref 4.22–5.81)
RDW: 14.6 % (ref 11.5–15.5)
WBC: 11.1 10*3/uL — AB (ref 4.0–10.5)

## 2015-10-09 LAB — BASIC METABOLIC PANEL
Anion gap: 8 (ref 5–15)
BUN: 58 mg/dL — AB (ref 6–20)
CHLORIDE: 112 mmol/L — AB (ref 101–111)
CO2: 21 mmol/L — AB (ref 22–32)
CREATININE: 2.69 mg/dL — AB (ref 0.61–1.24)
Calcium: 10.3 mg/dL (ref 8.9–10.3)
GFR calc Af Amer: 25 mL/min — ABNORMAL LOW (ref >60)
GFR calc non Af Amer: 21 mL/min — ABNORMAL LOW (ref >60)
Glucose, Bld: 95 mg/dL (ref 65–99)
POTASSIUM: 3.6 mmol/L (ref 3.5–5.1)
SODIUM: 141 mmol/L (ref 135–145)

## 2015-10-09 LAB — HEPARIN LEVEL (UNFRACTIONATED): Heparin Unfractionated: 0.4 IU/mL (ref 0.30–0.70)

## 2015-10-09 MED ORDER — SODIUM CHLORIDE 0.9 % IV SOLN
2.0000 g | Freq: Four times a day (QID) | INTRAVENOUS | Status: DC
  Administered 2015-10-09 – 2015-10-18 (×37): 2 g via INTRAVENOUS
  Filled 2015-10-09 (×38): qty 2000

## 2015-10-09 MED ORDER — DILTIAZEM HCL 60 MG PO TABS
60.0000 mg | ORAL_TABLET | Freq: Four times a day (QID) | ORAL | Status: DC
  Administered 2015-10-09 – 2015-10-18 (×36): 60 mg via ORAL
  Filled 2015-10-09 (×37): qty 1

## 2015-10-09 MED ORDER — LEVALBUTEROL HCL 0.63 MG/3ML IN NEBU
0.6300 mg | INHALATION_SOLUTION | Freq: Three times a day (TID) | RESPIRATORY_TRACT | Status: DC
  Administered 2015-10-10 – 2015-10-11 (×4): 0.63 mg via RESPIRATORY_TRACT
  Filled 2015-10-09 (×3): qty 3

## 2015-10-09 NOTE — Progress Notes (Signed)
Physical Therapy Treatment Patient Details Name: Roy [name] MRN: [id number] DOB: [birth year] Today's Date: 10/09/2015    History of Present Illness 80 y.o. male with medical history significant of DM, HTN, OSA, recent DVT in leg in May on Xarelto, remote history of prostate cancer in 2006 with radiation, prolonged remission since then, admitted on 7/14 with weakness/falls and tiredness, found by family on the floor. No syncope/falls. Found to be septic with A fib with RVR.     PT Comments    Vitals at rest: 75 bpm, O2 100% RA; during ambulation: 99 bpm, 87% RA. Pt walked ~100 feet in hallway with RW. He denied dizziness or dyspnea. Will follow and progress activity as able.   Follow Up Recommendations  Home health PT;Supervision - Intermittent     Equipment Recommendations  Rolling walker with 5" wheels    Recommendations for Other Services       Precautions / Restrictions Precautions Precautions: Fall Precaution Comments: afib   Restrictions Weight Bearing Restrictions: No    Mobility  Bed Mobility               General bed mobility comments: oob in recliner  Transfers Overall transfer level: Needs assistance Equipment used: Rolling walker (2 wheeled) Transfers: Sit to/from Stand Sit to Stand: Min assist;+2 safety/equipment         General transfer comment: Assist to rise, stabilize, conrol descent. VCs hand placement.   Ambulation/Gait Ambulation/Gait assistance: Min assist;+2 safety/equipment Ambulation Distance (Feet): 100 Feet Assistive device: Rolling walker (2 wheeled) Gait Pattern/deviations: Step-through pattern;Decreased stride length     General Gait Details: Assist to steady intermittently. HR 99 bpm, O2 87% on RA during ambulation. Pt denied dyspnea   Stairs            Wheelchair Mobility    Modified Rankin (Stroke Patients Only)       Balance           Standing balance support: Bilateral upper extremity  supported;During functional activity Standing balance-Leahy Scale: Poor                      Cognition Arousal/Alertness: Awake/alert Behavior During Therapy: WFL for tasks assessed/performed Overall Cognitive Status: Within Functional Limits for tasks assessed                      Exercises      General Comments        Pertinent Vitals/Pain Pain Assessment: No/denies pain    Home Living                      Prior Function            PT Goals (current goals can now be found in the care plan section) Progress towards PT goals: Progressing toward goals    Frequency  Min 3X/week    PT Plan Current plan remains appropriate    Co-evaluation             End of Session   Activity Tolerance: Patient tolerated treatment well Patient left: in chair;with call bell/phone within reach;with chair alarm set;with family/visitor present     Time: 1358-1411 PT Time Calculation (min) (ACUTE ONLY): 13 min  Charges:  $Gait Training: 8-22 mins                    G Codes:      Weston Anna, MPT Pager: [phone]

## 2015-10-09 NOTE — Progress Notes (Signed)
PROGRESS NOTE  Roy [name] [id number] DOB: 05-11-1935 DOA: 10/06/2015 PCP: Rogers Blocker, MD   LOS: 3 days   Brief Narrative: 80 y.o. male with medical history significant of DM, HTN, OSA, recent DVT in leg in May on Xarelto, remote history of prostate cancer in 2006 with radiation, prolonged remission since then, admitted on 7/14 with weakness/falls and tiredness, found by family on the floor. No syncope/falls. Found to be septic  Assessment & Plan: Principal Problem:   Sepsis secondary to UTI [facility]) Active Problems:   DM2 (diabetes mellitus, type 2) ([place])   Obstructive sleep apnea   Essential hypertension   CKD (chronic kidney disease) stage 3, GFR 30-59 ml/min   AKI (acute kidney injury) ([place])   UTI (lower urinary tract infection)   Bacteremia due to Enterococcus  Sepsis secondary to UTI / enterococcus bacteremia - sepsis physiology resolved - Blood cultures speciated this morning enterococcus which is sensitive to ampicillin, 1/2 bottles - We'll change antibiotics to ampicillin. Discussed with infectious disease, [name], who will see patient in consultation - He is afebrile, leukocytosis almost resolved - Urine cultures on admission ordered however unfortunately not obtained since they were automatically canceled by lab  A fib with RVR / PAF - telemetry showing A fib. He goes in and out of sinus rhythm  - 2D echo obtained and showed ejection fraction 60-65% without wall motion abnormalities. This is likely related to sepsis. - patient's CHA2DS2-VASc Score for Stroke Risk is 4, currently on heparin infusion. We'll plan to switch soon to Eliquis instead of Xarelto due to chronic kidney disease  - Start by mouth diltiazem 60 mg every 6 hours and wean off the thighs and infusion  AKI on CKD 3 - initially received IVF in the setting of sepsis however became tachypneic and SOB 7/15 needing Lasix.  - Patient has been having chronic kidney disease stage III borderline IV  as far back as 2014. His creatinine now seems a little bit worse than in the past. - renal US with moderate right-sided pelvicaliectasis, unchanged from 2013. I discussed his case with [name] from urology. It appears the patient has somewhat of a chronic retention issue, and he recommended placement of a Foley catheter and repeating the ultrasound of his kidneys 24 hours later to reevaluate whether this will improve the hydronephrosis.   DVT - recently diagnosed in May 2017 and on Xarelto since - given renal failure continue heparin infusion. We'll switch to Eliquis once assure no hematuria after foley placement - there was concern in the ER regarding his breathing status and underwent a VQ scan which was low probability for PE.   DM - SSI  Hyperkalemia - in the setting of renal failure, improved after kayexalate - monitor  OSA - continue CPAP    DVT prophylaxis: heparin gtt Code Status: Full Family Communication: no family bedside, discussed with his twin sisters last night  Disposition Plan: remain in SDU  Consultants:   Infectious disease  Procedures:   2-D echo Study Conclusions - Left ventricle: The cavity size was normal. There was mild focal basal and moderate concentric hypertrophy of the septum. Systolic function was normal. The estimated ejection fraction was in the range of 60% to 65%. Wall motion was normal; there were no regional wall motion abnormalities. Mitral valve: Moderately calcified annulus. Left atrium: The atrium was moderately dilated.  Antimicrobials:  Ceftriaxone 7/14 >> 7/17   Vancomycin 7/15 >> 7/17  Ampicillin 7/17 >>  Subjective: -  He is feeling much better, denies any shortness of breath or chest pain  - A little bit worried about Foley catheter placement   Objective: Filed Vitals:   10/09/15 0500 10/09/15 0600 10/09/15 0700 10/09/15 0800  BP: 125/61 151/61 142/65 131/73  Pulse: 88 76 70 81  Temp:    98.5 F (36.9 C)  TempSrc:     Oral  Resp: 24 22 21 25   Height:      Weight:      SpO2: 96% 97%  97%    Intake/Output Summary (Last 24 hours) at 10/09/15 1040 Last data filed at 10/09/15 0800  Gross per 24 hour  Intake 4749.5 ml  Output   1100 ml  Net 3649.5 ml   Filed Weights   10/06/15 1811 10/07/15 0400  Weight: 107.956 kg (238 lb) 102.8 kg (226 lb 10.1 oz)    Examination: Constitutional: NAD Filed Vitals:   10/09/15 0500 10/09/15 0600 10/09/15 0700 10/09/15 0800  BP: 125/61 151/61 142/65 131/73  Pulse: 88 76 70 81  Temp:    98.5 F (36.9 C)  TempSrc:    Oral  Resp: 24 22 21 25   Height:      Weight:      SpO2: 96% 97%  97%   Eyes: PERRL, lids and conjunctivae normal ENMT: Mucous membranes are moist.  Neck: normal, supple, no masses, no thyromegaly Respiratory: clear to auscultation bilaterally, no wheezing, no crackles. Normal respiratory effort. No accessory muscle use.  Cardiovascular: irregular, no murmurs / rubs / gallops. No LE edema. Abdomen: no tenderness. Bowel sounds positive.  Neurologic: non focal    Data Reviewed: I have personally reviewed following labs and imaging studies  CBC:  Recent Labs Lab 10/06/15 1516 10/07/15 0509 10/08/15 0329 10/09/15 0324  WBC 19.1* 15.7* 12.9* 11.1*  NEUTROABS 17.8*  --   --   --   HGB 14.8 12.5* 12.2* 10.9*  HCT 43.1 36.7* 35.6* 32.4*  MCV 91.3 91.5 91.0 90.5  PLT 225 193 190 [id number]   Basic Metabolic Panel:  Recent Labs Lab 10/06/15 1516 10/07/15 0116 10/07/15 0509 10/08/15 0329 10/09/15 0324  NA 136  --  138 136 141  K 5.9* 4.4 4.5 3.6 3.6  CL 104  --  110 109 112*  CO2 23  --  22 20* 21*  GLUCOSE 235*  --  200* 85 95  BUN 49*  --  52* 56* 58*  CREATININE 2.72*  --  2.66* 2.76* 2.69*  CALCIUM 10.8*  --  9.9 9.9 10.3   GFR: Estimated Creatinine Clearance: 27.2 mL/min (by C-G formula based on Cr of 2.69). Liver Function Tests: No results for input(s): AST, ALT, ALKPHOS, BILITOT, PROT, ALBUMIN in the last 168 hours. No  results for input(s): LIPASE, AMYLASE in the last 168 hours. No results for input(s): AMMONIA in the last 168 hours. Coagulation Profile: No results for input(s): INR, PROTIME in the last 168 hours. Cardiac Enzymes: No results for input(s): CKTOTAL, CKMB, CKMBINDEX, TROPONINI in the last 168 hours. BNP (last 3 results) No results for input(s): PROBNP in the last 8760 hours. HbA1C: No results for input(s): HGBA1C in the last 72 hours. CBG:  Recent Labs Lab 10/07/15 0811 10/07/15 1645 10/07/15 2035 10/08/15 0340 10/09/15 0821  GLUCAP 159* 202* 145* 87 112*   Lipid Profile: No results for input(s): CHOL, HDL, LDLCALC, TRIG, CHOLHDL, LDLDIRECT in the last 72 hours. Thyroid Function Tests: No results for input(s): TSH, T4TOTAL, FREET4, T3FREE, THYROIDAB in the last  72 hours. Anemia Panel: No results for input(s): VITAMINB12, FOLATE, FERRITIN, TIBC, IRON, RETICCTPCT in the last 72 hours. Urine analysis:    Component Value Date/Time   COLORURINE AMBER* 10/06/2015 1811   APPEARANCEUR TURBID* 10/06/2015 1811   LABSPEC 1.018 10/06/2015 1811   PHURINE 5.5 10/06/2015 1811   GLUCOSEU NEGATIVE 10/06/2015 1811   HGBUR LARGE* 10/06/2015 1811   BILIRUBINUR NEGATIVE 10/06/2015 1811   KETONESUR NEGATIVE 10/06/2015 1811   PROTEINUR >300* 10/06/2015 1811   UROBILINOGEN 0.2 09/26/2012 1222   NITRITE POSITIVE* 10/06/2015 1811   LEUKOCYTESUR LARGE* 10/06/2015 1811   Sepsis Labs: Invalid input(s): PROCALCITONIN, LACTICIDVEN  Recent Results (from the past 240 hour(s))  Blood culture (routine x 2)     Status: Abnormal (Preliminary result)   Collection Time: 10/06/15  3:15 PM  Result Value Ref Range Status   Specimen Description BLOOD RIGHT ANTECUBITAL  Final   Special Requests BOTTLES DRAWN AEROBIC AND ANAEROBIC 5CC  Final   Culture  Setup Time   Final    GRAM POSITIVE COCCI IN CHAINS ANAEROBIC BOTTLE ONLY CRITICAL RESULT CALLED TO, READ BACK BY AND VERIFIED WITH: K. HASKINS RN AT [phone]  10/07/15 BY D. Victoriano Lain    Culture ENTEROCOCCUS SPECIES (A)  Final   Report Status PENDING  Incomplete   Organism ID, Bacteria ENTEROCOCCUS SPECIES  Final      Susceptibility   Enterococcus species - MIC*    AMPICILLIN <=2 SENSITIVE Sensitive     VANCOMYCIN 1 SENSITIVE Sensitive     GENTAMICIN SYNERGY Value in next row Sensitive      SENSITIVEPerformed at [place]  Blood culture (routine x 2)     Status: None (Preliminary result)   Collection Time: 10/06/15  4:33 PM  Result Value Ref Range Status   Specimen Description BLOOD LEFT HAND  Final   Special Requests IN PEDIATRIC BOTTLE 2.5CC  Final   Culture   Final    NO GROWTH 2 DAYS Performed at [place] [facility]    Report Status PENDING  Incomplete  MRSA PCR Screening     Status: None   Collection Time: 10/06/15 10:34 PM  Result Value Ref Range Status   MRSA by PCR NEGATIVE NEGATIVE Final    Comment:        The GeneXpert MRSA Assay (FDA approved for NASAL specimens only), is one component of a comprehensive MRSA colonization surveillance program. It is not intended to diagnose MRSA infection nor to guide or monitor treatment for MRSA infections.   Rapid strep screen (not at [facility])     Status: None   Collection Time: 10/07/15  9:45 AM  Result Value Ref Range Status   Streptococcus, Group A Screen (Direct) NEGATIVE NEGATIVE Final    Comment: (NOTE) A Rapid Antigen test may result negative if the antigen level in the sample is below the detection level of this test. The FDA has not cleared this test as a stand-alone test therefore the rapid antigen negative result has reflexed to a Group A Strep culture.       Radiology Studies: Dg Chest Port 1 View  10/07/2015  CLINICAL DATA:  80 year old male with a history of dyspnea EXAM: PORTABLE CHEST 1 VIEW COMPARISON:  10/06/2015, CT 01/14/2006 FINDINGS: Cardiomediastinal silhouette unchanged. No pneumothorax or pleural effusion. No confluent  airspace disease. Linear opacities at the lung bases compatible of atelectasis. IMPRESSION: No radiographic evidence of acute cardiopulmonary disease Signed, Dulcy Fanny. Earleen Newport, DO [facility] Radiology Electronically Signed   By: Corrie Mckusick D.O.   On: 10/07/2015 15:58     Scheduled Meds: . amitriptyline  10 mg Oral QHS  . ampicillin (OMNIPEN) IV  2 g Intravenous Q6H  . calcium carbonate  1,250 mg Oral Q breakfast  . diltiazem  60 mg Oral Q6H  . gabapentin  100 mg Oral Daily  . insulin aspart  0-9 Units Subcutaneous Q4H  . QUEtiapine  25 mg Oral QHS   Continuous Infusions: . diltiazem (CARDIZEM) infusion 5 mg/hr (10/09/15 0800)  . heparin 1,450 Units/hr (10/09/15 0800)     Marzetta Board, MD, PhD [facility] Pager [phone]  If 7PM-7AM, please contact night-coverage [email or web address] Password [email or web address] 10/09/2015, 10:40 AM

## 2015-10-09 NOTE — Progress Notes (Signed)
ANTICOAGULATION CONSULT NOTE - Initial Consult  Pharmacy Consult for Heparin Indication: Hx DVT (home xarelto on hold)  No Known Allergies  Patient Measurements: Height: 5\' 10"  (177.8 cm) Weight: 226 lb 10.1 oz (102.8 kg) IBW/kg (Calculated) : 73 Heparin Dosing Weight: 96.3kg  Vital Signs: Temp: 98.1 F (36.7 C) (07/17 0400) Temp Source: Axillary (07/17 0400) BP: 142/65 mmHg (07/17 0700) Pulse Rate: 70 (07/17 0700)  Labs:  Recent Labs  10/06/15 2018 10/07/15 0509 10/07/15 1418 10/07/15 2208 10/08/15 0329 10/09/15 0324  HGB  --  12.5*  --   --  12.2* 10.9*  HCT  --  36.7*  --   --  35.6* 32.4*  PLT  --  193  --   --  190 184  APTT 44* 125* 92*  --   --   --   HEPARINUNFRC 1.02* 0.75* 0.45 0.36 0.30 0.40  CREATININE  --  2.66*  --   --  2.76* 2.69*    Estimated Creatinine Clearance: 27.2 mL/min (by C-G formula based on Cr of 2.69).   Medical History: Past Medical History  Diagnosis Date  . Diabetes mellitus   . Hypertension   . OSA on CPAP   . GERD (gastroesophageal reflux disease)   . History of prostate cancer 2010-- S/P  EXTERNAL RADIATION  . Hematuria   . Bladder calculi   . Hydronephrosis, left   . Dyspnea on exertion   . Arthritis HANDS/ THUMBS/ SHOULDERS  . Chronic obstructive asthma, unspecified FOLLOWED BY DR Tarri Fuller YOUNG  VISIT 10-14-2011 IN [facility]  . COPD (chronic obstructive pulmonary disease) ([facility])     Medications:  PTA xarelto starter pack prescribed 08/15/15 however pt did not pick up prescription from pharmacy due to insurance issues, told EDP he was receiving xarelto samples from PCP.  Verified with patient on 7/15 -- he completed xarelto dose pack and taking 20 mg daily PTA.  LD 7/12 @ 9AM.   Assessment: 81 yoM with PMHx prostate cancer, DM, HTN, OSA on CPAP, asthma, and COPD with recent dx acute DVT in May 2017 taking xarelto samples from PCP now presents for evaluation after multiple falls. Pharmacy consulted to start IV heparin as pt has  AKI.   Today, 10/09/2015: - Heparin level is therapeutic at 0.40 on 1450 units/hr - Hgb trending down, no bleeding reported; Plts low but stable overnight. - Scr elevated but stable, CrCl ~27 ml/min  Goal of Therapy:  Heparin level 0.3-0.7 units/ml  Monitor platelets by anticoagulation protocol: Yes   Plan:  - Continue heparin 1450 units/hr - Daily heparin level and CBC  Peggyann Juba, PharmD, BCPS Pager: [phone]  10/09/2015 7:48 AM

## 2015-10-09 NOTE — Consult Note (Signed)
[place] for Infectious Disease  Date of Admission:  10/06/2015  Date of Consult:  10/09/2015  Reason for Consult: Enterococcal bacteremia Referring Physician: Cruzita Lederer  Impression/Recommendation Sepsis- enterococci UTI ARF Constipation  Would  Check TEE Continue ampicillin Recheck BCx Prev Cr 1.95. Consider renal eval if not improving?  Comment- Suspect urinary source. His syncope does raise concern for metastatic infection. CT does not show acute lesion.  Thank you so much for this interesting consult,   Bobby Rumpf (pager) [phone] [email or web address]  Roy [name] is an 80 y.o. male.  HPI: 80 yo M with hx of Dm2 (for ? Yrs), HTN, prev DVT in L leg June 2017, prev prostate CA, adm on 7-14 after falling. He was found down by his sisters after being down for 6 hours (per pt).  In ED he was found to have WBC 19.1, +urine prot/LE, and Cr 2.72.  He was started on vanco/zosyn. Then changed to ceftriaxone/vanco.  His BCx have since been positive for 1/2 Enterococci. He did not have UCx.  His WBC has since improved in hospital (11.1 now).  His Cr is 2.69 today.  He has been changed to ampicillin today.   Past Medical History  Diagnosis Date  . Diabetes mellitus   . Hypertension   . OSA on CPAP   . GERD (gastroesophageal reflux disease)   . History of prostate cancer 2010-- S/P  EXTERNAL RADIATION  . Hematuria   . Bladder calculi   . Hydronephrosis, left   . Dyspnea on exertion   . Arthritis HANDS/ THUMBS/ SHOULDERS  . Chronic obstructive asthma, unspecified FOLLOWED BY DR Tarri Fuller YOUNG  VISIT 10-14-2011 IN EPIC  . COPD (chronic obstructive pulmonary disease) [facility])     Past Surgical History  Procedure Laterality Date  . Ganglion cyst excision  1988  (APPROX)    LEFT KNEE  . Carpal tunnel release  05-28-2005    LEFT  . Inguinal hernia repair  1955  . Cystoscopy with litholapaxy  10/18/2011    Procedure: CYSTOSCOPY WITH LITHOLAPAXY;  Surgeon:  Hanley Ben, MD;  Location: [place];  Service: Urology;  Laterality: N/A;  1 HOUR NO STENT H# [id number] CELL# [phone]  MEDICARE BCBS  . Cystoscopy N/A 09/26/2012    Procedure: CYSTOSCOPY, clot evacuation,fulgeration of bladder, bladder biopsy, transurethreal vaporation of prostat;  Surgeon: Alexis Frock, MD;  Location: [facility];  Service: Urology;  Laterality: N/A;     No Known Allergies  Medications:  Scheduled: . amitriptyline  10 mg Oral QHS  . ampicillin (OMNIPEN) IV  2 g Intravenous Q6H  . calcium carbonate  1,250 mg Oral Q breakfast  . diltiazem  60 mg Oral Q6H  . gabapentin  100 mg Oral Daily  . insulin aspart  0-9 Units Subcutaneous Q4H  . QUEtiapine  25 mg Oral QHS    Abtx:  Anti-infectives    Start     Dose/Rate Route Frequency Ordered Stop   10/09/15 1200  ampicillin (OMNIPEN) 2 g in sodium chloride 0.9 % 50 mL IVPB     2 g 150 mL/hr over 20 Minutes Intravenous Every 6 hours 10/09/15 1039     10/08/15 1800  vancomycin (VANCOCIN) 1,500 mg in sodium chloride 0.9 % 500 mL IVPB  Status:  Discontinued     1,500 mg 250 mL/hr over 120 Minutes Intravenous Every 48 hours 10/07/15 1027 10/09/15 1039   10/07/15 0000  cefTRIAXone (ROCEPHIN) 1 g in dextrose 5 % 50 mL IVPB  Status:  Discontinued     1 g 100 mL/hr over 30 Minutes Intravenous Every 24 hours 10/06/15 1926 10/09/15 1039   10/06/15 1900  cefTRIAXone (ROCEPHIN) 1 g in dextrose 5 % 50 mL IVPB  Status:  Discontinued     1 g 100 mL/hr over 30 Minutes Intravenous  Once 10/06/15 1859 10/06/15 1907   10/06/15 1800  vancomycin (VANCOCIN) 1,500 mg in sodium chloride 0.9 % 500 mL IVPB  Status:  Discontinued     1,500 mg 250 mL/hr over 120 Minutes Intravenous Every 48 hours 10/06/15 1750 10/06/15 1926   10/06/15 1745  piperacillin-tazobactam (ZOSYN) IVPB 3.375 g     3.375 g 100 mL/hr over 30 Minutes Intravenous  Once 10/06/15 1741 10/06/15 1844      Total days of antibiotics: 3  vanco/ceftriaxone          Social History:  reports that he quit smoking about 17 years ago. His smoking use included Cigarettes. He has a 12.5 pack-year smoking history. He has never used smokeless tobacco. He reports that he drinks alcohol. He reports that he does not use illicit drugs.  Family History  Problem Relation Age of Onset  . Diabetes Father     General ROS: no BM in 3 days. he denies f/c. no pevious dysuria. denies CP or SOB. see HPI.   Blood pressure 115/73, pulse 86, temperature 99.3 F (37.4 C), temperature source Core (Comment), resp. rate 25, height 5' 10"  (1.778 m), weight 102.8 kg (226 lb 10.1 oz), SpO2 94 %. General appearance: alert, cooperative and no distress Eyes: negative findings: conjunctivae and sclerae normal and pupils equal, round, reactive to light and accomodation Throat: normal findings: oropharynx pink & moist without lesions or evidence of thrush and abnormal findings: dentition: poor Neck: no adenopathy and supple, symmetrical, trachea midline Lungs: clear to auscultation bilaterally Heart: irregularly irregular rhythm Abdomen: normal findings: bowel sounds normal and soft, non-tender Extremities: edema 2-3+ on LLE. varicosities   Results for orders placed or performed during the hospital encounter of 10/06/15 (from the past 48 hour(s))  Glucose, capillary     Status: Abnormal   Collection Time: 10/07/15  4:45 PM  Result Value Ref Range   Glucose-Capillary 202 (H) 65 - 99 mg/dL  Blood gas, arterial     Status: Abnormal   Collection Time: 10/07/15  5:25 PM  Result Value Ref Range   O2 Content 2.0 L/min   Delivery systems NASAL CANNULA    pH, Arterial 7.451 (H) 7.350 - 7.450   pCO2 arterial 25.8 (L) 35.0 - 45.0 mmHg   pO2, Arterial 132 (H) 80.0 - 100.0 mmHg   Bicarbonate 17.7 (L) 20.0 - 24.0 mEq/L   TCO2 15.7 0 - 100 mmol/L   Acid-base deficit 4.5 (H) 0.0 - 2.0 mmol/L   O2 Saturation 98.7 %   Patient temperature 37.0    Collection site  LEFT RADIAL    Drawn by [id number]    Sample type ARTERIAL DRAW    Allens test (pass/fail) PASS PASS  Glucose, capillary     Status: Abnormal   Collection Time: 10/07/15  8:35 PM  Result Value Ref Range   Glucose-Capillary 145 (H) 65 - 99 mg/dL  Heparin level (unfractionated)     Status: None   Collection Time: 10/07/15 10:08 PM  Result Value Ref Range   Heparin Unfractionated 0.36 0.30 - 0.70 IU/mL    Comment:        IF HEPARIN RESULTS ARE BELOW EXPECTED VALUES, AND  PATIENT DOSAGE HAS BEEN CONFIRMED, SUGGEST FOLLOW UP TESTING OF ANTITHROMBIN III LEVELS.   Glucose, capillary     Status: Abnormal   Collection Time: 10/08/15 12:14 AM  Result Value Ref Range   Glucose-Capillary 61 (L) 65 - 99 mg/dL  Glucose, capillary     Status: None   Collection Time: 10/08/15 12:53 AM  Result Value Ref Range   Glucose-Capillary 91 65 - 99 mg/dL  Heparin level (unfractionated)     Status: None   Collection Time: 10/08/15  3:29 AM  Result Value Ref Range   Heparin Unfractionated 0.30 0.30 - 0.70 IU/mL    Comment:        IF HEPARIN RESULTS ARE BELOW EXPECTED VALUES, AND PATIENT DOSAGE HAS BEEN CONFIRMED, SUGGEST FOLLOW UP TESTING OF ANTITHROMBIN III LEVELS.   Basic metabolic panel     Status: Abnormal   Collection Time: 10/08/15  3:29 AM  Result Value Ref Range   Sodium 136 135 - 145 mmol/L   Potassium 3.6 3.5 - 5.1 mmol/L    Comment: DELTA CHECK NOTED REPEATED TO VERIFY    Chloride 109 101 - 111 mmol/L   CO2 20 (L) 22 - 32 mmol/L   Glucose, Bld 85 65 - 99 mg/dL   BUN 56 (H) 6 - 20 mg/dL   Creatinine, Ser 2.76 (H) 0.61 - 1.24 mg/dL   Calcium 9.9 8.9 - 10.3 mg/dL   GFR calc non Af Amer 20 (L) >60 mL/min   GFR calc Af Amer 24 (L) >60 mL/min    Comment: (NOTE) The eGFR has been calculated using the CKD EPI equation. This calculation has not been validated in all clinical situations. eGFR's persistently <60 mL/min signify possible Chronic Kidney Disease.    Anion gap 7 5 - 15  CBC      Status: Abnormal   Collection Time: 10/08/15  3:29 AM  Result Value Ref Range   WBC 12.9 (H) 4.0 - 10.5 K/uL   RBC 3.91 (L) 4.22 - 5.81 MIL/uL   Hemoglobin 12.2 (L) 13.0 - 17.0 g/dL   HCT 35.6 (L) 39.0 - 52.0 %   MCV 91.0 78.0 - 100.0 fL   MCH 31.2 26.0 - 34.0 pg   MCHC 34.3 30.0 - 36.0 g/dL   RDW 14.6 11.5 - 15.5 %   Platelets 190 150 - 400 K/uL  Glucose, capillary     Status: None   Collection Time: 10/08/15  3:40 AM  Result Value Ref Range   Glucose-Capillary 87 65 - 99 mg/dL  Glucose, capillary     Status: Abnormal   Collection Time: 10/08/15  7:50 AM  Result Value Ref Range   Glucose-Capillary 60 (L) 65 - 99 mg/dL  Glucose, capillary     Status: None   Collection Time: 10/08/15  8:23 AM  Result Value Ref Range   Glucose-Capillary 92 65 - 99 mg/dL  Glucose, capillary     Status: Abnormal   Collection Time: 10/08/15 12:26 PM  Result Value Ref Range   Glucose-Capillary 121 (H) 65 - 99 mg/dL   Comment 1 Notify RN    Comment 2 Document in Chart   Glucose, capillary     Status: None   Collection Time: 10/08/15  3:44 PM  Result Value Ref Range   Glucose-Capillary 91 65 - 99 mg/dL   Comment 1 Notify RN    Comment 2 Document in Chart   Glucose, capillary     Status: Abnormal   Collection Time: 10/08/15  8:04  PM  Result Value Ref Range   Glucose-Capillary 153 (H) 65 - 99 mg/dL  Glucose, capillary     Status: None   Collection Time: 10/08/15 11:25 PM  Result Value Ref Range   Glucose-Capillary 92 65 - 99 mg/dL  Heparin level (unfractionated)     Status: None   Collection Time: 10/09/15  3:24 AM  Result Value Ref Range   Heparin Unfractionated 0.40 0.30 - 0.70 IU/mL    Comment:        IF HEPARIN RESULTS ARE BELOW EXPECTED VALUES, AND PATIENT DOSAGE HAS BEEN CONFIRMED, SUGGEST FOLLOW UP TESTING OF ANTITHROMBIN III LEVELS.   Basic metabolic panel     Status: Abnormal   Collection Time: 10/09/15  3:24 AM  Result Value Ref Range   Sodium 141 135 - 145 mmol/L    Potassium 3.6 3.5 - 5.1 mmol/L   Chloride 112 (H) 101 - 111 mmol/L   CO2 21 (L) 22 - 32 mmol/L   Glucose, Bld 95 65 - 99 mg/dL   BUN 58 (H) 6 - 20 mg/dL   Creatinine, Ser 2.69 (H) 0.61 - 1.24 mg/dL   Calcium 10.3 8.9 - 10.3 mg/dL   GFR calc non Af Amer 21 (L) >60 mL/min   GFR calc Af Amer 25 (L) >60 mL/min    Comment: (NOTE) The eGFR has been calculated using the CKD EPI equation. This calculation has not been validated in all clinical situations. eGFR's persistently <60 mL/min signify possible Chronic Kidney Disease.    Anion gap 8 5 - 15  CBC     Status: Abnormal   Collection Time: 10/09/15  3:24 AM  Result Value Ref Range   WBC 11.1 (H) 4.0 - 10.5 K/uL   RBC 3.58 (L) 4.22 - 5.81 MIL/uL   Hemoglobin 10.9 (L) 13.0 - 17.0 g/dL   HCT 32.4 (L) 39.0 - 52.0 %   MCV 90.5 78.0 - 100.0 fL   MCH 30.4 26.0 - 34.0 pg   MCHC 33.6 30.0 - 36.0 g/dL   RDW 14.6 11.5 - 15.5 %   Platelets 184 150 - 400 K/uL  Glucose, capillary     Status: None   Collection Time: 10/09/15  3:55 AM  Result Value Ref Range   Glucose-Capillary 95 65 - 99 mg/dL  Glucose, capillary     Status: Abnormal   Collection Time: 10/09/15  8:21 AM  Result Value Ref Range   Glucose-Capillary 112 (H) 65 - 99 mg/dL  Glucose, capillary     Status: Abnormal   Collection Time: 10/09/15 12:04 PM  Result Value Ref Range   Glucose-Capillary 154 (H) 65 - 99 mg/dL      Component Value Date/Time   SDES THROAT 10/07/2015 0945   SPECREQUEST NONE Reflexed from [id number] 10/07/2015 0945   CULT  10/07/2015 0945    CULTURE REINCUBATED FOR BETTER GROWTH Performed at [place] PENDING 10/07/2015 0945   Dg Chest Port 1 View  10/07/2015  CLINICAL DATA:  80 year old male with a history of dyspnea EXAM: PORTABLE CHEST 1 VIEW COMPARISON:  10/06/2015, CT 01/14/2006 FINDINGS: Cardiomediastinal silhouette unchanged. No pneumothorax or pleural effusion. No confluent airspace disease. Linear opacities at the lung bases  compatible of atelectasis. IMPRESSION: No radiographic evidence of acute cardiopulmonary disease Signed, Dulcy Fanny. Earleen Newport, DO [facility] Electronically Signed   By: Corrie Mckusick D.O.   On: 10/07/2015 15:58   Recent Results (from the past 240 hour(s))  Blood culture (routine x 2)     Status: Abnormal (Preliminary result)   Collection Time: 10/06/15  3:15 PM  Result Value Ref Range Status   Specimen Description BLOOD RIGHT ANTECUBITAL  Final   Special Requests BOTTLES DRAWN AEROBIC AND ANAEROBIC 5CC  Final   Culture  Setup Time   Final    GRAM POSITIVE COCCI IN CHAINS ANAEROBIC BOTTLE ONLY CRITICAL RESULT CALLED TO, READ BACK BY AND VERIFIED WITH: K. HASKINS RN AT [phone] 10/07/15 BY D. Victoriano Lain    Culture ENTEROCOCCUS SPECIES (A)  Final   Report Status PENDING  Incomplete   Organism ID, Bacteria ENTEROCOCCUS SPECIES  Final      Susceptibility   Enterococcus species - MIC*    AMPICILLIN <=2 SENSITIVE Sensitive     VANCOMYCIN 1 SENSITIVE Sensitive     GENTAMICIN SYNERGY Value in next row Sensitive      SENSITIVEPerformed at [place]  Blood culture (routine x 2)     Status: None (Preliminary result)   Collection Time: 10/06/15  4:33 PM  Result Value Ref Range Status   Specimen Description BLOOD LEFT HAND  Final   Special Requests IN PEDIATRIC BOTTLE 2.5CC  Final   Culture   Final    NO GROWTH 3 DAYS Performed at [facility]    Report Status PENDING  Incomplete  MRSA PCR Screening     Status: None   Collection Time: 10/06/15 10:34 PM  Result Value Ref Range Status   MRSA by PCR NEGATIVE NEGATIVE Final    Comment:        The GeneXpert MRSA Assay (FDA approved for NASAL specimens only), is one component of a comprehensive MRSA colonization surveillance program. It is not intended to diagnose MRSA infection nor to guide or monitor treatment for MRSA infections.   Rapid strep  screen (not at [facility])     Status: None   Collection Time: 10/07/15  9:45 AM  Result Value Ref Range Status   Streptococcus, Group A Screen (Direct) NEGATIVE NEGATIVE Final    Comment: (NOTE) A Rapid Antigen test may result negative if the antigen level in the sample is below the detection level of this test. The FDA has not cleared this test as a stand-alone test therefore the rapid antigen negative result has reflexed to a Group A Strep culture.   Culture, group A strep     Status: None (Preliminary result)   Collection Time: 10/07/15  9:45 AM  Result Value Ref Range Status   Specimen Description THROAT  Final   Special Requests NONE Reflexed from [id number]  Final   Culture   Final    CULTURE REINCUBATED FOR BETTER GROWTH Performed at [facility]    Report Status PENDING  Incomplete      10/09/2015, 2:46 PM     LOS: 3 days    Records and images were personally reviewed where available.

## 2015-10-10 ENCOUNTER — Inpatient Hospital Stay (HOSPITAL_COMMUNITY): Payer: Medicare Other

## 2015-10-10 LAB — GLUCOSE, CAPILLARY
GLUCOSE-CAPILLARY: 142 mg/dL — AB (ref 65–99)
GLUCOSE-CAPILLARY: 131 mg/dL — AB (ref 65–99)
GLUCOSE-CAPILLARY: 103 mg/dL — AB (ref 65–99)
Glucose-Capillary: 111 mg/dL — ABNORMAL HIGH (ref 65–99)
Glucose-Capillary: 129 mg/dL — ABNORMAL HIGH (ref 65–99)
Glucose-Capillary: 135 mg/dL — ABNORMAL HIGH (ref 65–99)

## 2015-10-10 LAB — CULTURE, GROUP A STREP (THRC)

## 2015-10-10 LAB — CBC
HEMATOCRIT: 34.1 % — AB (ref 39.0–52.0)
HEMOGLOBIN: 11.4 g/dL — AB (ref 13.0–17.0)
MCH: 30.6 pg (ref 26.0–34.0)
MCHC: 33.4 g/dL (ref 30.0–36.0)
MCV: 91.4 fL (ref 78.0–100.0)
Platelets: 212 10*3/uL (ref 150–400)
RBC: 3.73 MIL/uL — AB (ref 4.22–5.81)
RDW: 14.8 % (ref 11.5–15.5)
WBC: 11.9 10*3/uL — AB (ref 4.0–10.5)

## 2015-10-10 LAB — BASIC METABOLIC PANEL
ANION GAP: 5 (ref 5–15)
BUN: 52 mg/dL — ABNORMAL HIGH (ref 6–20)
CO2: 21 mmol/L — AB (ref 22–32)
Calcium: 10.4 mg/dL — ABNORMAL HIGH (ref 8.9–10.3)
Chloride: 115 mmol/L — ABNORMAL HIGH (ref 101–111)
Creatinine, Ser: 2.47 mg/dL — ABNORMAL HIGH (ref 0.61–1.24)
GFR calc non Af Amer: 23 mL/min — ABNORMAL LOW (ref >60)
GFR, EST AFRICAN AMERICAN: 27 mL/min — AB (ref >60)
GLUCOSE: 128 mg/dL — AB (ref 65–99)
POTASSIUM: 4.1 mmol/L (ref 3.5–5.1)
Sodium: 141 mmol/L (ref 135–145)

## 2015-10-10 LAB — CULTURE, BLOOD (ROUTINE X 2)

## 2015-10-10 LAB — HEPARIN LEVEL (UNFRACTIONATED): HEPARIN UNFRACTIONATED: 0.33 [IU]/mL (ref 0.30–0.70)

## 2015-10-10 NOTE — Progress Notes (Addendum)
ANTICOAGULATION CONSULT NOTE - Follow up [place] for Heparin Indication: Hx DVT (home xarelto on hold)  No Known Allergies  Patient Measurements: Height: 5\' 10"  (177.8 cm) Weight: 226 lb 10.1 oz (102.8 kg) IBW/kg (Calculated) : 73 Heparin Dosing Weight: 96.3kg  Vital Signs: Temp: 98.6 F (37 C) (07/18 0447) Temp Source: Oral (07/18 0447) BP: 127/60 mmHg (07/18 0447) Pulse Rate: 90 (07/18 0447)  Labs:  Recent Labs  10/07/15 1418  10/08/15 0329 10/09/15 0324 10/10/15 0509  HGB  --   < > 12.2* 10.9* 11.4*  HCT  --   --  35.6* 32.4* 34.1*  PLT  --   --  190 184 212  APTT 92*  --   --   --   --   HEPARINUNFRC 0.45  < > 0.30 0.40 0.33  CREATININE  --   --  2.76* 2.69* 2.47*  < > = values in this interval not displayed.  Estimated Creatinine Clearance: 29.6 mL/min (by C-G formula based on Cr of 2.47).   Medical History: Past Medical History  Diagnosis Date  . Diabetes mellitus   . Hypertension   . OSA on CPAP   . GERD (gastroesophageal reflux disease)   . History of prostate cancer 2010-- S/P  EXTERNAL RADIATION  . Hematuria   . Bladder calculi   . Hydronephrosis, left   . Dyspnea on exertion   . Arthritis HANDS/ THUMBS/ SHOULDERS  . Chronic obstructive asthma, unspecified FOLLOWED BY DR Tarri Fuller YOUNG  VISIT 10-14-2011 IN EPIC  . COPD (chronic obstructive pulmonary disease) (HCC)     Medications:  PTA xarelto starter pack prescribed 08/15/15 however pt did not pick up prescription from pharmacy due to insurance issues, told EDP he was receiving xarelto samples from PCP.  Verified with patient on 7/15 -- he completed xarelto dose pack and taking 20 mg daily PTA.  LD 7/12 @ 9AM.   Assessment: 6 yoM with PMHx prostate cancer, DM, HTN, OSA on CPAP, asthma, and COPD with recent dx acute DVT in May 2017 taking xarelto samples from PCP now presents for evaluation after multiple falls. Pharmacy consulted to start IV heparin as pt has AKI.   Today,  10/10/2015: - Heparin level is therapeutic at 0.33 on 1450 units/hr - Hgb remains low but improved to 11.4, Plts low but WNL. - Noted hematuria today catheter. Appears improving now (worse overnight) but RN and MD are both aware and will continue to monitor.  Pt states that this is not his baseline, but vitals are stable. - Scr elevated but stable, CrCl ~29 ml/min  Goal of Therapy:  Heparin level 0.3-0.7 units/ml  Monitor platelets by anticoagulation protocol: Yes   Plan:  - Continue heparin 1450 units/hr - Daily heparin level and CBC - Follow up long-term anticoagulation plan   Gretta Arab PharmD, BCPS Pager [phone] 10/10/2015 7:19 AM

## 2015-10-10 NOTE — Care Management Important Message (Signed)
Important Message  Patient Details  Name: Roy [name] MRN: [id number] Date of Birth: Aug 14, 1935   Medicare Important Message Given:  Yes    Camillo Flaming 10/10/2015, 10:06 AMImportant Message  Patient Details  Name: Roy [name] MRN: [id number] Date of Birth: [birth year]   Medicare Important Message Given:  Yes    Camillo Flaming 10/10/2015, 10:05 AM

## 2015-10-10 NOTE — Progress Notes (Addendum)
PROGRESS NOTE  Roy [name] [id number] DOB: 1935/10/19 DOA: 10/06/2015 PCP: Rogers Blocker, MD   LOS: 4 days   Brief Narrative: 80 y.o. male with medical history significant of DM, HTN, OSA, recent DVT in leg in May on Xarelto, remote history of prostate cancer in 2006 with radiation, prolonged remission since then, admitted on 7/14 with weakness/falls and tiredness, found by family on the floor. No syncope/falls. Found to be septic  Assessment & Plan: Principal Problem:   Sepsis secondary to UTI [facility]) Active Problems:   DM2 (diabetes mellitus, type 2) ([place])   Obstructive sleep apnea   Essential hypertension   CKD (chronic kidney disease) stage 3, GFR 30-59 ml/min   AKI (acute kidney injury) ([place])   UTI (lower urinary tract infection)   Bacteremia due to Enterococcus    Sepsis secondary to UTI / enterococcus bacteremia - sepsis physiology resolved - Blood cultures speciated enterococcus which is sensitive to ampicillin, 1/2 bottles. Not a contaminant - now on ampicillin. ID following. TEE pending, to be done 7/19 at 3 pm per cardiology - He is afebrile. Surveillance cultures pending - Urine cultures on admission ordered however unfortunately not obtained since they were automatically canceled by lab - discussed with [name], if TEE negative, will need total of 14 days of antibiotics, minimum 7 of IV  A fib with RVR / PAF - telemetry showing A fib. He goes in and out of sinus rhythm  - 2D echo obtained and showed ejection fraction 60-65% without wall motion abnormalities. This is likely related to sepsis. - patient's CHA2DS2-VASc Score for Stroke Risk is 4, currently on heparin infusion. We'll plan to switch soon to Eliquis instead of Xarelto due to chronic kidney disease  - continue po diltiazem able to be weaned off of his diltiazem infusion 7/17  Acute on chronic urinary retention - renal US with moderate right-sided pelvicaliectasis, unchanged from 2013. I discussed  his case with [name] from urology. It appears the patient has somewhat of a chronic retention issue, and he recommended placement of a Foley catheter and repeating the ultrasound of his kidneys 24 hours later to reevaluate whether this will improve the hydronephrosis.  - foley placed 7/17, repeat US pending 7/18. If hydro still persists will talk with urology - renal function slightly better after foley. - pink urine when foley was placed, clearing some today but still pink tinged. Hb stable. Continue heparin for now but hold starting Eliquis until after TEE  Addendum 7 pm. Called by RN that patient has had few clots in the bag in the afternoon. Urine looks pink without frank hematuria. Will continue heparin drip for now since HB is stable. Will need formal Urology consult in am.  AKI on CKD 3 - initially received IVF in the setting of sepsis however became tachypneic and SOB 7/15 needing Lasix.  - Patient has been having chronic kidney disease stage III borderline IV as far back as 2014. His creatinine now seems a little bit worse than in the past. This may be related to chronic retention vs natural progression.  DVT - recently diagnosed in May 2017 and on Xarelto since - given renal failure continue heparin infusion. We'll switch to Eliquis once assure no hematuria after foley placement - there was concern in the ER regarding his breathing status and underwent a VQ scan which was low probability for PE.   DM - SSI  Hyperkalemia - in the setting of renal failure, improved after kayexalate -  monitor  OSA - continue CPAP     DVT prophylaxis: heparin gtt Code Status: Full Family Communication: no family bedside this morning Disposition Plan: home 2-3 days  Consultants:   Infectious disease  Urology ([name]) over the phone  Procedures:   2-D echo Study Conclusions - Left ventricle: The cavity size was normal. There was mild focal basal and moderate concentric hypertrophy  of the septum. Systolic function was normal. The estimated ejection fraction was in the range of 60% to 65%. Wall motion was normal; there were no regional wall motion abnormalities. Mitral valve: Moderately calcified annulus. Left atrium: The atrium was moderately dilated.  Antimicrobials:  Ceftriaxone 7/14 >> 7/17   Vancomycin 7/15 >> 7/17  Ampicillin 7/17 >>  Subjective: - no complaints this morning, doing well. No chest pain, no shortness of breath  Objective: Filed Vitals:   10/09/15 2220 10/09/15 2224 10/10/15 0447 10/10/15 0811  BP:   127/60   Pulse:  92 90   Temp:   98.6 F (37 C)   TempSrc:   Oral   Resp:  22 24   Height:      Weight:      SpO2: 99% 99% 98% 97%    Intake/Output Summary (Last 24 hours) at 10/10/15 1150 Last data filed at 10/10/15 0732  Gross per 24 hour  Intake 313.58 ml  Output   3450 ml  Net -3136.42 ml   Filed Weights   10/06/15 1811 10/07/15 0400  Weight: 107.956 kg (238 lb) 102.8 kg (226 lb 10.1 oz)    Examination: Constitutional: NAD Filed Vitals:   10/09/15 2220 10/09/15 2224 10/10/15 0447 10/10/15 0811  BP:   127/60   Pulse:  92 90   Temp:   98.6 F (37 C)   TempSrc:   Oral   Resp:  22 24   Height:      Weight:      SpO2: 99% 99% 98% 97%   Eyes: PERRL, lids and conjunctivae normal ENMT: Mucous membranes are moist.  Neck: normal, supple, no masses, no thyromegaly Respiratory: clear to auscultation bilaterally, no wheezing, no crackles. Normal respiratory effort. No accessory muscle use.  Cardiovascular: irregular, no murmurs / rubs / gallops. No LE edema. Abdomen: no tenderness. Bowel sounds positive.  Neurologic: non focal    Data Reviewed: I have personally reviewed following labs and imaging studies  CBC:  Recent Labs Lab 10/06/15 1516 10/07/15 0509 10/08/15 0329 10/09/15 0324 10/10/15 0509  WBC 19.1* 15.7* 12.9* 11.1* 11.9*  NEUTROABS 17.8*  --   --   --   --   HGB 14.8 12.5* 12.2* 10.9* 11.4*  HCT 43.1  36.7* 35.6* 32.4* 34.1*  MCV 91.3 91.5 91.0 90.5 91.4  PLT 225 193 190 184 [id number]   Basic Metabolic Panel:  Recent Labs Lab 10/06/15 1516 10/07/15 0116 10/07/15 0509 10/08/15 0329 10/09/15 0324 10/10/15 0509  NA 136  --  138 136 141 141  K 5.9* 4.4 4.5 3.6 3.6 4.1  CL 104  --  110 109 112* 115*  CO2 23  --  22 20* 21* 21*  GLUCOSE 235*  --  200* 85 95 128*  BUN 49*  --  52* 56* 58* 52*  CREATININE 2.72*  --  2.66* 2.76* 2.69* 2.47*  CALCIUM 10.8*  --  9.9 9.9 10.3 10.4*   GFR: Estimated Creatinine Clearance: 29.6 mL/min (by C-G formula based on Cr of 2.47). Liver Function Tests: No results for input(s): AST, ALT, ALKPHOS,  BILITOT, PROT, ALBUMIN in the last 168 hours. No results for input(s): LIPASE, AMYLASE in the last 168 hours. No results for input(s): AMMONIA in the last 168 hours. Coagulation Profile: No results for input(s): INR, PROTIME in the last 168 hours. Cardiac Enzymes: No results for input(s): CKTOTAL, CKMB, CKMBINDEX, TROPONINI in the last 168 hours. BNP (last 3 results) No results for input(s): PROBNP in the last 8760 hours. HbA1C: No results for input(s): HGBA1C in the last 72 hours. CBG:  Recent Labs Lab 10/09/15 1638 10/09/15 2028 10/10/15 0004 10/10/15 0411 10/10/15 0744  GLUCAP 124* 184* 135* 142* 131*   Lipid Profile: No results for input(s): CHOL, HDL, LDLCALC, TRIG, CHOLHDL, LDLDIRECT in the last 72 hours. Thyroid Function Tests: No results for input(s): TSH, T4TOTAL, FREET4, T3FREE, THYROIDAB in the last 72 hours. Anemia Panel: No results for input(s): VITAMINB12, FOLATE, FERRITIN, TIBC, IRON, RETICCTPCT in the last 72 hours. Urine analysis:    Component Value Date/Time   COLORURINE AMBER* 10/06/2015 1811   APPEARANCEUR TURBID* 10/06/2015 1811   LABSPEC 1.018 10/06/2015 1811   PHURINE 5.5 10/06/2015 1811   GLUCOSEU NEGATIVE 10/06/2015 1811   HGBUR LARGE* 10/06/2015 1811   BILIRUBINUR NEGATIVE 10/06/2015 1811   KETONESUR NEGATIVE  10/06/2015 1811   PROTEINUR >300* 10/06/2015 1811   UROBILINOGEN 0.2 09/26/2012 1222   NITRITE POSITIVE* 10/06/2015 1811   LEUKOCYTESUR LARGE* 10/06/2015 1811   Sepsis Labs: Invalid input(s): PROCALCITONIN, LACTICIDVEN  Recent Results (from the past 240 hour(s))  Blood culture (routine x 2)     Status: Abnormal   Collection Time: 10/06/15  3:15 PM  Result Value Ref Range Status   Specimen Description BLOOD RIGHT ANTECUBITAL  Final   Special Requests BOTTLES DRAWN AEROBIC AND ANAEROBIC 5CC  Final   Culture  Setup Time   Final    GRAM POSITIVE COCCI IN CHAINS ANAEROBIC BOTTLE ONLY CRITICAL RESULT CALLED TO, READ BACK BY AND VERIFIED WITH: K. HASKINS RN AT [id number] 10/07/15 BY D. VANHOOK    Culture (A)  Final    ENTEROCOCCUS SPECIES STAPHYLOCOCCUS SPECIES (COAGULASE NEGATIVE) THE SIGNIFICANCE OF ISOLATING THIS ORGANISM FROM A SINGLE SET OF BLOOD CULTURES WHEN MULTIPLE SETS ARE DRAWN IS UNCERTAIN. PLEASE NOTIFY THE MICROBIOLOGY DEPARTMENT WITHIN ONE WEEK IF SPECIATION AND SENSITIVITIES ARE REQUIRED. Performed at [facility]    Report Status 10/10/2015 FINAL  Final   Organism ID, Bacteria ENTEROCOCCUS SPECIES  Final      Susceptibility   Enterococcus species - MIC*    AMPICILLIN <=2 SENSITIVE Sensitive     VANCOMYCIN 1 SENSITIVE Sensitive     GENTAMICIN SYNERGY SENSITIVE Sensitive     * ENTEROCOCCUS SPECIES  Blood culture (routine x 2)     Status: None (Preliminary result)   Collection Time: 10/06/15  4:33 PM  Result Value Ref Range Status   Specimen Description BLOOD LEFT HAND  Final   Special Requests IN PEDIATRIC BOTTLE 2.5CC  Final   Culture   Final    NO GROWTH 3 DAYS Performed at [facility]    Report Status PENDING  Incomplete  MRSA PCR Screening     Status: None   Collection Time: 10/06/15 10:34 PM  Result Value Ref Range Status   MRSA by PCR NEGATIVE NEGATIVE Final    Comment:        The GeneXpert MRSA Assay (FDA approved for NASAL specimens only), is  one component of a comprehensive MRSA colonization surveillance program. It is not intended to diagnose MRSA infection nor  to guide or monitor treatment for MRSA infections.   Rapid strep screen (not at [facility])     Status: None   Collection Time: 10/07/15  9:45 AM  Result Value Ref Range Status   Streptococcus, Group A Screen (Direct) NEGATIVE NEGATIVE Final    Comment: (NOTE) A Rapid Antigen test may result negative if the antigen level in the sample is below the detection level of this test. The FDA has not cleared this test as a stand-alone test therefore the rapid antigen negative result has reflexed to a Group A Strep culture.   Culture, group A strep     Status: None (Preliminary result)   Collection Time: 10/07/15  9:45 AM  Result Value Ref Range Status   Specimen Description THROAT  Final   Special Requests NONE Reflexed from [id number]  Final   Culture   Final    CULTURE REINCUBATED FOR BETTER GROWTH Performed at [facility]    Report Status PENDING  Incomplete      Radiology Studies: No results found.   Scheduled Meds: . amitriptyline  10 mg Oral QHS  . ampicillin (OMNIPEN) IV  2 g Intravenous Q6H  . calcium carbonate  1,250 mg Oral Q breakfast  . diltiazem  60 mg Oral Q6H  . gabapentin  100 mg Oral Daily  . insulin aspart  0-9 Units Subcutaneous Q4H  . levalbuterol  0.63 mg Nebulization TID  . QUEtiapine  25 mg Oral QHS   Continuous Infusions: . diltiazem (CARDIZEM) infusion Stopped (10/09/15 1114)  . heparin 1,450 Units/hr (10/09/15 1815)     Marzetta Board, MD, PhD [facility] Pager [phone]  If 7PM-7AM, please contact night-coverage [email or web address] Password [email or web address] 10/10/2015, 11:50 AM

## 2015-10-10 NOTE — Progress Notes (Signed)
INFECTIOUS DISEASE PROGRESS NOTE  ID: Roy [name] is a 80 y.o. male with  Principal Problem:   Sepsis secondary to UTI [place]) Active Problems:   DM2 (diabetes mellitus, type 2) ([place])   Obstructive sleep apnea   Essential hypertension   CKD (chronic kidney disease) stage 3, GFR 30-59 ml/min   AKI (acute kidney injury) ([place])   UTI (lower urinary tract infection)   Bacteremia due to Enterococcus  Subjective: Without complaints No problems with abtx- no rash, no diarrhea  Abtx:  Anti-infectives    Start     Dose/Rate Route Frequency Ordered Stop   10/09/15 1200  ampicillin (OMNIPEN) 2 g in sodium chloride 0.9 % 50 mL IVPB     2 g 150 mL/hr over 20 Minutes Intravenous Every 6 hours 10/09/15 1039     10/08/15 1800  vancomycin (VANCOCIN) 1,500 mg in sodium chloride 0.9 % 500 mL IVPB  Status:  Discontinued     1,500 mg 250 mL/hr over 120 Minutes Intravenous Every 48 hours 10/07/15 1027 10/09/15 1039   10/07/15 0000  cefTRIAXone (ROCEPHIN) 1 g in dextrose 5 % 50 mL IVPB  Status:  Discontinued     1 g 100 mL/hr over 30 Minutes Intravenous Every 24 hours 10/06/15 1926 10/09/15 1039   10/06/15 1900  cefTRIAXone (ROCEPHIN) 1 g in dextrose 5 % 50 mL IVPB  Status:  Discontinued     1 g 100 mL/hr over 30 Minutes Intravenous  Once 10/06/15 1859 10/06/15 1907   10/06/15 1800  vancomycin (VANCOCIN) 1,500 mg in sodium chloride 0.9 % 500 mL IVPB  Status:  Discontinued     1,500 mg 250 mL/hr over 120 Minutes Intravenous Every 48 hours 10/06/15 1750 10/06/15 1926   10/06/15 1745  piperacillin-tazobactam (ZOSYN) IVPB 3.375 g     3.375 g 100 mL/hr over 30 Minutes Intravenous  Once 10/06/15 1741 10/06/15 1844      Medications:  Scheduled: . amitriptyline  10 mg Oral QHS  . ampicillin (OMNIPEN) IV  2 g Intravenous Q6H  . calcium carbonate  1,250 mg Oral Q breakfast  . diltiazem  60 mg Oral Q6H  . gabapentin  100 mg Oral Daily  . insulin aspart  0-9 Units Subcutaneous Q4H  .  levalbuterol  0.63 mg Nebulization TID  . QUEtiapine  25 mg Oral QHS    Objective: Vital signs in last 24 hours: Temp:  [97.6 F (36.4 C)-99.3 F (37.4 C)] 98.6 F (37 C) (07/18 0447) Pulse Rate:  [66-96] 90 (07/18 0447) Resp:  [20-32] 24 (07/18 0447) BP: (111-164)/(60-89) 127/60 mmHg (07/18 0447) SpO2:  [94 %-100 %] 97 % (07/18 0811)   General appearance: alert, cooperative and no distress Resp: clear to auscultation bilaterally Cardio: regular rate and rhythm GI: normal findings: bowel sounds normal and soft, non-tender  Lab Results  Recent Labs  10/09/15 0324 10/10/15 0509  WBC 11.1* 11.9*  HGB 10.9* 11.4*  HCT 32.4* 34.1*  NA 141 141  K 3.6 4.1  CL 112* 115*  CO2 21* 21*  BUN 58* 52*  CREATININE 2.69* 2.47*   Liver Panel No results for input(s): PROT, ALBUMIN, AST, ALT, ALKPHOS, BILITOT, BILIDIR, IBILI in the last 72 hours. Sedimentation Rate No results for input(s): ESRSEDRATE in the last 72 hours. C-Reactive Protein No results for input(s): CRP in the last 72 hours.  Microbiology: Recent Results (from the past 240 hour(s))  Blood culture (routine x 2)     Status: Abnormal   Collection  Time: 10/06/15  3:15 PM  Result Value Ref Range Status   Specimen Description BLOOD RIGHT ANTECUBITAL  Final   Special Requests BOTTLES DRAWN AEROBIC AND ANAEROBIC 5CC  Final   Culture  Setup Time   Final    GRAM POSITIVE COCCI IN CHAINS ANAEROBIC BOTTLE ONLY CRITICAL RESULT CALLED TO, READ BACK BY AND VERIFIED WITH: K. HASKINS RN AT [id number] 10/07/15 BY D. VANHOOK    Culture (A)  Final    ENTEROCOCCUS SPECIES STAPHYLOCOCCUS SPECIES (COAGULASE NEGATIVE) THE SIGNIFICANCE OF ISOLATING THIS ORGANISM FROM A SINGLE SET OF BLOOD CULTURES WHEN MULTIPLE SETS ARE DRAWN IS UNCERTAIN. PLEASE NOTIFY THE MICROBIOLOGY DEPARTMENT WITHIN ONE WEEK IF SPECIATION AND SENSITIVITIES ARE REQUIRED. Performed at [facility]    Report Status 10/10/2015 FINAL  Final   Organism ID, Bacteria  ENTEROCOCCUS SPECIES  Final      Susceptibility   Enterococcus species - MIC*    AMPICILLIN <=2 SENSITIVE Sensitive     VANCOMYCIN 1 SENSITIVE Sensitive     GENTAMICIN SYNERGY SENSITIVE Sensitive     * ENTEROCOCCUS SPECIES  Blood culture (routine x 2)     Status: None (Preliminary result)   Collection Time: 10/06/15  4:33 PM  Result Value Ref Range Status   Specimen Description BLOOD LEFT HAND  Final   Special Requests IN PEDIATRIC BOTTLE 2.5CC  Final   Culture   Final    NO GROWTH 3 DAYS Performed at [facility]    Report Status PENDING  Incomplete  MRSA PCR Screening     Status: None   Collection Time: 10/06/15 10:34 PM  Result Value Ref Range Status   MRSA by PCR NEGATIVE NEGATIVE Final    Comment:        The GeneXpert MRSA Assay (FDA approved for NASAL specimens only), is one component of a comprehensive MRSA colonization surveillance program. It is not intended to diagnose MRSA infection nor to guide or monitor treatment for MRSA infections.   Rapid strep screen (not at [facility])     Status: None   Collection Time: 10/07/15  9:45 AM  Result Value Ref Range Status   Streptococcus, Group A Screen (Direct) NEGATIVE NEGATIVE Final    Comment: (NOTE) A Rapid Antigen test may result negative if the antigen level in the sample is below the detection level of this test. The FDA has not cleared this test as a stand-alone test therefore the rapid antigen negative result has reflexed to a Group A Strep culture.   Culture, group A strep     Status: None (Preliminary result)   Collection Time: 10/07/15  9:45 AM  Result Value Ref Range Status   Specimen Description THROAT  Final   Special Requests NONE Reflexed from [id number]  Final   Culture   Final    CULTURE REINCUBATED FOR BETTER GROWTH Performed at [facility]    Report Status PENDING  Incomplete    Studies/Results: No results found.   Assessment/Plan: Sepsis-  enterococci UTI ARF Constipation  Would  Check TEE Continue ampicillin Recheck BCx Prev Cr 1.95. , improved today to 2.47  Total days of antibiotics: 4 ampicillin         Bobby Rumpf Infectious Diseases (pager) [phone] [email or web address] 10/10/2015, 10:40 AM  LOS: 4 days

## 2015-10-10 NOTE — Care Management Note (Deleted)
Case Management Note  Patient Details  Name: Roy [name] MRN: [id number] Date of Birth: [birth year]  Subjective/Objective:  PT-recc HHPT. Provided patient w/HHC agency list-patient chose [facility]-rep-Karen aware.Patient will stay @ address:[id number] aware of address.                 Action/Plan:d/c plan home w/HHC.   Expected Discharge Date:                  Expected Discharge Plan:  [place]  In-House Referral:     Discharge planning Services  CM Consult  Post Acute Care Choice:    Choice offered to:  Patient  DME Arranged:    DME Agency:     HH Arranged:  PT [place]:  [place]  Status of Service:  In process, will continue to follow  If discussed at Long Length of Stay Meetings, dates discussed:    Additional Comments:  Dessa Phi, RN 10/10/2015, 1:59 PM

## 2015-10-10 NOTE — Progress Notes (Signed)
Patient scheduled to have a TEE at 9am on 7/19.  [facility].

## 2015-10-10 NOTE — Care Management Note (Signed)
Case Management Note  Patient Details  Name: Roy [name] MRN: [id number] Date of Birth: [birth year]  Subjective/Objective:   Patient chose [facility] for HHPT-rep Santiago Glad aware-await HHPT orders,[id number]. Would recc HHRN-disease mgmnt.                 Action/Plan:d/c plan home w/HHC.   Expected Discharge Date:                  Expected Discharge Plan:  [place]  In-House Referral:     Discharge planning Services  CM Consult  Post Acute Care Choice:    Choice offered to:  Patient  DME Arranged:    DME Agency:     HH Arranged:  PT [place]:  [place]  Status of Service:  In process, will continue to follow  If discussed at Long Length of Stay Meetings, dates discussed:    Additional Comments:  Dessa Phi, RN 10/10/2015, 2:13 PM

## 2015-10-10 NOTE — Progress Notes (Signed)
Patient is  passing some blood clots into the foley bag.  Urine is pink.  MD made aware.  Will continue to monitor closely

## 2015-10-11 ENCOUNTER — Inpatient Hospital Stay (HOSPITAL_COMMUNITY): Payer: Medicare Other

## 2015-10-11 ENCOUNTER — Encounter (HOSPITAL_COMMUNITY)
Admission: EM | Disposition: A | Discharge status: Home Health | Payer: Self-pay | Source: Home / Self Care | Attending: Internal Medicine

## 2015-10-11 ENCOUNTER — Encounter (HOSPITAL_COMMUNITY): Payer: Self-pay | Admitting: *Deleted

## 2015-10-11 DIAGNOSIS — R7881 Bacteremia: Secondary | ICD-10-CM

## 2015-10-11 HISTORY — PX: TEE WITHOUT CARDIOVERSION: SHX5443

## 2015-10-11 LAB — BASIC METABOLIC PANEL
ANION GAP: 5 (ref 5–15)
BUN: 45 mg/dL — AB (ref 6–20)
CHLORIDE: 114 mmol/L — AB (ref 101–111)
CO2: 22 mmol/L (ref 22–32)
Calcium: 10.6 mg/dL — ABNORMAL HIGH (ref 8.9–10.3)
Creatinine, Ser: 2.29 mg/dL — ABNORMAL HIGH (ref 0.61–1.24)
GFR, EST AFRICAN AMERICAN: 30 mL/min — AB (ref >60)
GFR, EST NON AFRICAN AMERICAN: 26 mL/min — AB (ref >60)
Glucose, Bld: 116 mg/dL — ABNORMAL HIGH (ref 65–99)
POTASSIUM: 3.8 mmol/L (ref 3.5–5.1)
SODIUM: 141 mmol/L (ref 135–145)

## 2015-10-11 LAB — CULTURE, BLOOD (ROUTINE X 2): Culture: NO GROWTH

## 2015-10-11 LAB — GLUCOSE, CAPILLARY
GLUCOSE-CAPILLARY: 115 mg/dL — AB (ref 65–99)
GLUCOSE-CAPILLARY: 118 mg/dL — AB (ref 65–99)
GLUCOSE-CAPILLARY: 121 mg/dL — AB (ref 65–99)
Glucose-Capillary: 106 mg/dL — ABNORMAL HIGH (ref 65–99)
Glucose-Capillary: 110 mg/dL — ABNORMAL HIGH (ref 65–99)
Glucose-Capillary: 169 mg/dL — ABNORMAL HIGH (ref 65–99)
Glucose-Capillary: 152 mg/dL — ABNORMAL HIGH (ref 65–99)

## 2015-10-11 LAB — CBC
HCT: 33.6 % — ABNORMAL LOW (ref 39.0–52.0)
HEMOGLOBIN: 11.1 g/dL — AB (ref 13.0–17.0)
MCH: 30.2 pg (ref 26.0–34.0)
MCHC: 33 g/dL (ref 30.0–36.0)
MCV: 91.6 fL (ref 78.0–100.0)
PLATELETS: 256 10*3/uL (ref 150–400)
RBC: 3.67 MIL/uL — AB (ref 4.22–5.81)
RDW: 14.8 % (ref 11.5–15.5)
WBC: 9.5 10*3/uL (ref 4.0–10.5)

## 2015-10-11 LAB — HEPARIN LEVEL (UNFRACTIONATED): HEPARIN UNFRACTIONATED: 0.49 [IU]/mL (ref 0.30–0.70)

## 2015-10-11 SURGERY — ECHOCARDIOGRAM, TRANSESOPHAGEAL
Anesthesia: Moderate Sedation

## 2015-10-11 MED ORDER — LEVALBUTEROL HCL 0.63 MG/3ML IN NEBU
0.6300 mg | INHALATION_SOLUTION | Freq: Two times a day (BID) | RESPIRATORY_TRACT | Status: DC
  Administered 2015-10-11 – 2015-10-12 (×2): 0.63 mg via RESPIRATORY_TRACT
  Filled 2015-10-11 (×2): qty 3

## 2015-10-11 MED ORDER — BUTAMBEN-TETRACAINE-BENZOCAINE 2-2-14 % EX AERO
INHALATION_SPRAY | CUTANEOUS | Status: DC | PRN
  Administered 2015-10-11: 2 via TOPICAL

## 2015-10-11 MED ORDER — FENTANYL CITRATE (PF) 100 MCG/2ML IJ SOLN
INTRAMUSCULAR | Status: DC | PRN
  Administered 2015-10-11: 25 ug via INTRAVENOUS
  Administered 2015-10-11: 12.5 ug via INTRAVENOUS

## 2015-10-11 MED ORDER — FENTANYL CITRATE (PF) 100 MCG/2ML IJ SOLN
INTRAMUSCULAR | Status: AC
  Filled 2015-10-11: qty 2

## 2015-10-11 MED ORDER — SODIUM CHLORIDE 0.9 % IV SOLN: INTRAVENOUS | Status: DC

## 2015-10-11 MED ORDER — MIDAZOLAM HCL 10 MG/2ML IJ SOLN
INTRAMUSCULAR | Status: DC | PRN
  Administered 2015-10-11: 1 mg via INTRAVENOUS
  Administered 2015-10-11: 2 mg via INTRAVENOUS

## 2015-10-11 MED ORDER — SODIUM CHLORIDE 0.9 % IV SOLN
INTRAVENOUS | Status: DC
  Administered 2015-10-11 – 2015-10-14 (×2): via INTRAVENOUS

## 2015-10-11 MED ORDER — MIDAZOLAM HCL 5 MG/ML IJ SOLN
INTRAMUSCULAR | Status: AC
  Filled 2015-10-11: qty 2

## 2015-10-11 NOTE — Progress Notes (Addendum)
Physical Therapy Treatment Patient Details Name: Roy [name] MRN: [id number] DOB: 02-21-1936 Today's Date: 10/11/2015    History of Present Illness 80 y.o. male with medical history significant of DM, HTN, OSA, recent DVT in leg in May on Xarelto, remote history of prostate cancer in 2006 with radiation, prolonged remission since then, admitted on 7/14 with weakness/falls and tiredness, found by family on the floor. No syncope/falls. Found to be septic with A fib with RVR.     PT Comments    Progressing with mobility. HR upper 90s bpm at rest, 106 bpm during ambulation. Pt tolerated distance well. Pt denied dizziness.  Will continue to follow and progress activity. Recommend daily ambulation with nursing supervision as well.  Follow Up Recommendations  Home health PT;Supervision - Intermittent     Equipment Recommendations  Rolling walker with 5" wheels    Recommendations for Other Services       Precautions / Restrictions Precautions Precautions: Fall Precaution Comments: afib   Restrictions Weight Bearing Restrictions: No    Mobility  Bed Mobility Overal bed mobility: Needs Assistance Bed Mobility: Supine to Sit     Supine to sit: Min assist;HOB elevated     General bed mobility comments: small amount of assist for trunk. Increased time.   Transfers Overall transfer level: Needs assistance Equipment used: Rolling walker (2 wheeled) Transfers: Sit to/from Stand Sit to Stand: Min assist         General transfer comment: small amount of assist to rise, stabilize, control descent.   Ambulation/Gait Ambulation/Gait assistance: Min assist Ambulation Distance (Feet): 200 Feet Assistive device: Rolling walker (2 wheeled) Gait Pattern/deviations: Step-through pattern;Decreased stride length     General Gait Details: slow gait speed. HR 106 bpm during ambulation. Pt tolerated distance well.    Stairs            Wheelchair Mobility    Modified  [place] (Stroke Patients Only)       Balance                                    Cognition Arousal/Alertness: Awake/alert Behavior During Therapy: WFL for tasks assessed/performed Overall Cognitive Status: Within Functional Limits for tasks assessed                      Exercises      General Comments        Pertinent Vitals/Pain Pain Assessment: No/denies pain    Home Living                      Prior Function            PT Goals (current goals can now be found in the care plan section) Progress towards PT goals: Progressing toward goals    Frequency  Min 3X/week    PT Plan Current plan remains appropriate    Co-evaluation             End of Session   Activity Tolerance: Patient tolerated treatment well Patient left: in chair;with call bell/phone within reach;with chair alarm set     Time: 1430-1445 PT Time Calculation (min) (ACUTE ONLY): 15 min  Charges:  $Gait Training: 8-22 mins                    G Codes:      Weston Anna, MPT Pager: [phone]

## 2015-10-11 NOTE — Consult Note (Signed)
Urology Consult   Physician requesting consult: Dr. Hollice Gong  Reason for consult: hematuria  History of Present Illness: Roy [name] is a 80 y.o. with a complicated medical history of DM, HTN, OSA, recent DVT in leg in May on Xarelto in addition to prostate cancer treated with IMRT from 03/06/09 through 05/05/09 for Gleason 6 prostate cancer with PSA of 9.6. He has had no evidence of disease recurrence (nadir of 0.8 and most recently 0.75). Unfortunately, he has had issues with radiation cystitis and proctitis. He was previously followed by Dr. Janice Norrie and had a cystolitholapaxy with attempted left retrograde (unable to located UO) performed in July 2013 after CT showed bilateral renal cysts with bladder stones and severe left hydronephrosis. He presented with significant gross hematuria in July 2014 and had a cystoscopy, fulguration of bladder with diffuse radiation cystitis noted at that time and vaporization of an obstructing median lobe at the time which appeared friable. His lateral lobes were noted to be moderate sized at the time. He diagnosed in late May with a DVT of the left LE and placed on Xarelto. He had been having occasional small amount of gross hematuria at home after this. He previously had been on flomax but stopped this for unclear reasons with worsening urinary urgency and frequency, straining to completely void. Additionally he did have a UTI three weeks ago with dysuria with was treated with a course of antibiotics.  The patient was admitted on 7/14 with weakness and fall after which he was found to have enterococcus in 1/2 blood cultures with no urine culture sent. His UA was concerning for UTI. His creatinine was 2.7, elevated from baseline of 1.9 in May 2017 and his WBC was 19K. A renal ultrasound on 7/15 demonstrated bilateral hydronephrosis and a foley catheter was ultimately placed on 10/09/15. His creatinine has improved to 2.3 and his WBC is now 9.5. No vegetation was seen  on TEE today and he is currently on ampicillin. A repeat renal ultrasound today shows resolution of the bilateral hydronephrosis. He has had pink tinged urine since catheter placement which is improving. He has been afebrile with no bladder or abdominal pain, flank pain. He did have dysuria prior to admission.  Past Medical History  Diagnosis Date  . Diabetes mellitus   . Hypertension   . OSA on CPAP   . GERD (gastroesophageal reflux disease)   . History of prostate cancer 2010-- S/P  EXTERNAL RADIATION  . Hematuria   . Bladder calculi   . Hydronephrosis, left   . Dyspnea on exertion   . Arthritis HANDS/ THUMBS/ SHOULDERS  . Chronic obstructive asthma, unspecified FOLLOWED BY DR Tarri Fuller YOUNG  VISIT 10-14-2011 IN EPIC  . COPD (chronic obstructive pulmonary disease) [facility])     Past Surgical History  Procedure Laterality Date  . Ganglion cyst excision  1988  (APPROX)    LEFT KNEE  . Carpal tunnel release  05-28-2005    LEFT  . Inguinal hernia repair  1955  . Cystoscopy with litholapaxy  10/18/2011    Procedure: CYSTOSCOPY WITH LITHOLAPAXY;  Surgeon: Hanley Ben, MD;  Location: [facility];  Service: Urology;  Laterality: N/A;  1 HOUR NO STENT H# [id number] CELL# [id number]  MEDICARE BCBS  . Cystoscopy N/A 09/26/2012    Procedure: CYSTOSCOPY, clot evacuation,fulgeration of bladder, bladder biopsy, transurethreal vaporation of prostat;  Surgeon: Alexis Frock, MD;  Location: [facility];  Service: Urology;  Laterality: N/A;    Current Hospital Medications:  Home  Meds:    Medication List    ASK your doctor about these medications        amitriptyline 10 MG tablet  Commonly known as:  ELAVIL  Take 10 mg by mouth at bedtime.     calcium carbonate 600 MG Tabs tablet  Commonly known as:  OS-CAL  Take 600 mg by mouth daily with breakfast.     cyclobenzaprine 5 MG tablet  Commonly known as:  FLEXERIL  Take 5 mg by mouth as needed for muscle spasms.      cyclobenzaprine 10 MG tablet  Commonly known as:  FLEXERIL  Take 10 mg by mouth 3 (three) times daily as needed for muscle spasms.     diltiazem 240 MG 24 hr capsule  Commonly known as:  CARDIZEM CD  Take 240 mg by mouth daily.     Fish Oil 1000 MG Caps  Take 1 capsule by mouth daily.     gabapentin 100 MG capsule  Commonly known as:  NEURONTIN  Take 100 mg by mouth daily.     glimepiride 4 MG tablet  Commonly known as:  AMARYL  Take 4 mg by mouth daily with breakfast.     GNP GINGKO BILOBA EXTRACT PO  Take 1 tablet by mouth daily.     lisinopril 40 MG tablet  Commonly known as:  PRINIVIL,ZESTRIL  Take 40 mg by mouth daily.     loratadine 10 MG tablet  Commonly known as:  CLARITIN  Take 10 mg by mouth daily.     pantoprazole 40 MG tablet  Commonly known as:  PROTONIX  Take 40 mg by mouth daily as needed (Acid reflux).     polyethylene glycol packet  Commonly known as:  MIRALAX / GLYCOLAX  Take 17 g by mouth daily as needed for moderate constipation.     potassium chloride SA 20 MEQ tablet  Commonly known as:  K-DUR,KLOR-CON  Take 20 mEq by mouth daily.     PROAIR HFA 108 (90 Base) MCG/ACT inhaler  Generic drug:  albuterol  Inhale 2 puffs into the lungs 4 (four) times daily as needed.     QUEtiapine 25 MG tablet  Commonly known as:  SEROQUEL  Take 25 mg by mouth at bedtime.     rivaroxaban 20 MG Tabs tablet  Commonly known as:  XARELTO  Take 20 mg by mouth daily.     sitaGLIPtin 100 MG tablet  Commonly known as:  JANUVIA  Take 100 mg by mouth daily.     torsemide 100 MG tablet  Commonly known as:  DEMADEX  Take 50 mg by mouth 2 (two) times daily.     vitamin B-12 500 MCG tablet  Commonly known as:  CYANOCOBALAMIN  Take 500 mcg by mouth daily.     vitamin C 1000 MG tablet  Take 1,000 mg by mouth daily.        Scheduled Meds: . amitriptyline  10 mg Oral QHS  . ampicillin (OMNIPEN) IV  2 g Intravenous Q6H  . calcium carbonate  1,250 mg Oral Q  breakfast  . diltiazem  60 mg Oral Q6H  . gabapentin  100 mg Oral Daily  . insulin aspart  0-9 Units Subcutaneous Q4H  . levalbuterol  0.63 mg Nebulization BID  . QUEtiapine  25 mg Oral QHS   Continuous Infusions: . sodium chloride 10 mL/hr at 10/11/15 0915  . diltiazem (CARDIZEM) infusion Stopped (10/09/15 1114)  . heparin 1,450 Units/hr (10/11/15 0612)  PRN Meds:.acetaminophen, albuterol, cyclobenzaprine, levalbuterol, pantoprazole, polyethylene glycol, technetium TC 74M diethylenetriame-pentaacetic acid  Allergies: No Known Allergies  Family History  Problem Relation Age of Onset  . Diabetes Father     Social History:  reports that he quit smoking about 17 years ago. His smoking use included Cigarettes. He has a 12.5 pack-year smoking history. He has never used smokeless tobacco. He reports that he drinks alcohol. He reports that he does not use illicit drugs.  ROS: A complete review of systems was performed.  All systems are negative except for pertinent findings as noted.  Physical Exam:  Vital signs in last 24 hours: Temp:  [97.4 F (36.3 C)-98.2 F (36.8 C)] 97.7 F (36.5 C) (07/19 1343) Pulse Rate:  [73-92] 86 (07/19 1343) Resp:  [16-26] 18 (07/19 1343) BP: (140-195)/(63-133) 145/66 mmHg (07/19 1343) SpO2:  [94 %-100 %] 97 % (07/19 1418) Constitutional:  Alert and oriented, No acute distress Cardiovascular: normal peripheral perfusion Respiratory: Normal respiratory effort  GI: Abdomen is soft, nontender, nondistended, no abdominal masses GU: No CVA tenderness bilaterally. Foley catheter draining yellow urine in tubing Lymphatic: No lymphadenopathy Neurologic: Grossly intact, no focal deficits Psychiatric: Normal mood and affect  Laboratory Data:   Recent Labs  10/09/15 0324 10/10/15 0509 10/11/15 0531  WBC 11.1* 11.9* 9.5  HGB 10.9* 11.4* 11.1*  HCT 32.4* 34.1* 33.6*  PLT 184 212 256     Recent Labs  10/09/15 0324 10/10/15 0509 10/11/15 0531   NA 141 141 141  K 3.6 4.1 3.8  CL 112* 115* 114*  GLUCOSE 95 128* 116*  BUN 58* 52* 45*  CALCIUM 10.3 10.4* 10.6*  CREATININE 2.69* 2.47* 2.29*     Results for orders placed or performed during the hospital encounter of 10/06/15 (from the past 24 hour(s))  Glucose, capillary     Status: Abnormal   Collection Time: 10/10/15  4:58 PM  Result Value Ref Range   Glucose-Capillary 111 (H) 65 - 99 mg/dL  Glucose, capillary     Status: Abnormal   Collection Time: 10/10/15  8:45 PM  Result Value Ref Range   Glucose-Capillary 129 (H) 65 - 99 mg/dL  Glucose, capillary     Status: Abnormal   Collection Time: 10/11/15 12:35 AM  Result Value Ref Range   Glucose-Capillary 118 (H) 65 - 99 mg/dL  Glucose, capillary     Status: Abnormal   Collection Time: 10/11/15  4:17 AM  Result Value Ref Range   Glucose-Capillary 115 (H) 65 - 99 mg/dL  Heparin level (unfractionated)     Status: None   Collection Time: 10/11/15  5:31 AM  Result Value Ref Range   Heparin Unfractionated 0.49 0.30 - 0.70 IU/mL  CBC     Status: Abnormal   Collection Time: 10/11/15  5:31 AM  Result Value Ref Range   WBC 9.5 4.0 - 10.5 K/uL   RBC 3.67 (L) 4.22 - 5.81 MIL/uL   Hemoglobin 11.1 (L) 13.0 - 17.0 g/dL   HCT 33.6 (L) 39.0 - 52.0 %   MCV 91.6 78.0 - 100.0 fL   MCH 30.2 26.0 - 34.0 pg   MCHC 33.0 30.0 - 36.0 g/dL   RDW 14.8 11.5 - 15.5 %   Platelets 256 150 - 400 K/uL  Basic metabolic panel     Status: Abnormal   Collection Time: 10/11/15  5:31 AM  Result Value Ref Range   Sodium 141 135 - 145 mmol/L   Potassium 3.8 3.5 - 5.1 mmol/L  Chloride 114 (H) 101 - 111 mmol/L   CO2 22 22 - 32 mmol/L   Glucose, Bld 116 (H) 65 - 99 mg/dL   BUN 45 (H) 6 - 20 mg/dL   Creatinine, Ser 2.29 (H) 0.61 - 1.24 mg/dL   Calcium 10.6 (H) 8.9 - 10.3 mg/dL   GFR calc non Af Amer 26 (L) >60 mL/min   GFR calc Af Amer 30 (L) >60 mL/min   Anion gap 5 5 - 15  Glucose, capillary     Status: Abnormal   Collection Time: 10/11/15  8:00  AM  Result Value Ref Range   Glucose-Capillary 106 (H) 65 - 99 mg/dL  Glucose, capillary     Status: Abnormal   Collection Time: 10/11/15 11:52 AM  Result Value Ref Range   Glucose-Capillary 110 (H) 65 - 99 mg/dL   Recent Results (from the past 240 hour(s))  Blood culture (routine x 2)     Status: Abnormal   Collection Time: 10/06/15  3:15 PM  Result Value Ref Range Status   Specimen Description BLOOD RIGHT ANTECUBITAL  Final   Special Requests BOTTLES DRAWN AEROBIC AND ANAEROBIC 5CC  Final   Culture  Setup Time   Final    GRAM POSITIVE COCCI IN CHAINS ANAEROBIC BOTTLE ONLY CRITICAL RESULT CALLED TO, READ BACK BY AND VERIFIED WITH: K. HASKINS RN AT [id number] 10/07/15 BY D. VANHOOK    Culture (A)  Final    ENTEROCOCCUS SPECIES STAPHYLOCOCCUS SPECIES (COAGULASE NEGATIVE) THE SIGNIFICANCE OF ISOLATING THIS ORGANISM FROM A SINGLE SET OF BLOOD CULTURES WHEN MULTIPLE SETS ARE DRAWN IS UNCERTAIN. PLEASE NOTIFY THE MICROBIOLOGY DEPARTMENT WITHIN ONE WEEK IF SPECIATION AND SENSITIVITIES ARE REQUIRED. Performed at [facility]    Report Status 10/10/2015 FINAL  Final   Organism ID, Bacteria ENTEROCOCCUS SPECIES  Final      Susceptibility   Enterococcus species - MIC*    AMPICILLIN <=2 SENSITIVE Sensitive     VANCOMYCIN 1 SENSITIVE Sensitive     GENTAMICIN SYNERGY SENSITIVE Sensitive     * ENTEROCOCCUS SPECIES  Blood culture (routine x 2)     Status: None   Collection Time: 10/06/15  4:33 PM  Result Value Ref Range Status   Specimen Description BLOOD LEFT HAND  Final   Special Requests IN PEDIATRIC BOTTLE 2.5CC  Final   Culture   Final    NO GROWTH 5 DAYS Performed at [facility]    Report Status 10/11/2015 FINAL  Final  MRSA PCR Screening     Status: None   Collection Time: 10/06/15 10:34 PM  Result Value Ref Range Status   MRSA by PCR NEGATIVE NEGATIVE Final    Comment:        The GeneXpert MRSA Assay (FDA approved for NASAL specimens only), is one component of  a comprehensive MRSA colonization surveillance program. It is not intended to diagnose MRSA infection nor to guide or monitor treatment for MRSA infections.   Rapid strep screen (not at [facility])     Status: None   Collection Time: 10/07/15  9:45 AM  Result Value Ref Range Status   Streptococcus, Group A Screen (Direct) NEGATIVE NEGATIVE Final    Comment: (NOTE) A Rapid Antigen test may result negative if the antigen level in the sample is below the detection level of this test. The FDA has not cleared this test as a stand-alone test therefore the rapid antigen negative result has reflexed to a Group A Strep culture.   Culture, group  A strep     Status: None   Collection Time: 10/07/15  9:45 AM  Result Value Ref Range Status   Specimen Description THROAT  Final   Special Requests NONE Reflexed from [id number]  Final   Culture   Final    NO GROUP A STREP (S.PYOGENES) ISOLATED Performed at [facility]    Report Status 10/10/2015 FINAL  Final  Culture, blood (Routine X 2) w Reflex to ID Panel     Status: None (Preliminary result)   Collection Time: 10/09/15  3:38 PM  Result Value Ref Range Status   Specimen Description BLOOD LEFT HAND  Final   Special Requests BOTTLES DRAWN AEROBIC AND ANAEROBIC 5CC  Final   Culture   Final    NO GROWTH 2 DAYS Performed at [facility]    Report Status PENDING  Incomplete    Renal Function:  Recent Labs  10/06/15 1516 10/07/15 0509 10/08/15 0329 10/09/15 0324 10/10/15 0509 10/11/15 0531  CREATININE 2.72* 2.66* 2.76* 2.69* 2.47* 2.29*   Estimated Creatinine Clearance: 31.9 mL/min (by C-G formula based on Cr of 2.29).  Radiologic Imaging: US Renal  10/11/2015  CLINICAL DATA:  80 year old male with chronic renal disease, obstructive uropathy. Subsequent encounter. EXAM: RENAL / URINARY TRACT ULTRASOUND COMPLETE COMPARISON:  Recent renal ultrasounds including 10/10/2015. CT Abdomen and Pelvis 10/15/2011 FINDINGS: Right Kidney:  Length: 15.5 cm. Mild right hydronephrosis has resolved since 10/07/2015. Simple renal cysts again noted. Left Kidney: Length: 12.6 cm. Mild to moderate left hydronephrosis has resolved since 10/07/2015. Simple left renal cysts re- demonstrated. Bladder: Shadowing bladder calculi. Diminutive bladder volume, Foley catheter balloon visible within the bladder. IMPRESSION: 1. Bilateral hydronephrosis has resolved since 10/07/2015. 2. Chronic bladder calculi. 3. Numerous simple bilateral renal cysts, inconsequential. Electronically Signed   By: Genevie Ann M.D.   On: 10/11/2015 13:55   US Renal  10/10/2015  CLINICAL DATA:  Question of hydronephrosis on recent prior ultrasound examination EXAM: RENAL / URINARY TRACT ULTRASOUND COMPLETE COMPARISON:  October 07, 2015 FINDINGS: Right Kidney: Length: 14.5 cm. Echogenicity and renal cortical thickness are within normal limits. There is no perinephric fluid. There is slight fullness of the right renal pelvis. There is no caliectasis seen. There is a focal cyst arising from the upper pole of the right kidney measuring 4 3.6 x 3.7 x 4.4 cm. A second cyst in the upper to mid portion of the right kidney measures 1.8 x 2.0 x 1.9 cm. No sonographically demonstrable calculus or ureterectasis. Left Kidney: Length: 12.9 cm. Echogenicity is within normal limits. Renal cortical thickness is low normal. No perinephric fluid visualized. There is no appreciable hydronephrosis on the left. There is a cyst arising from the mid to lower portion left kidney measuring 4.7 x 4.2 x 5.4 cm. There is a cyst in the periphery of the left mid kidney measuring 2.2 x 2.4 x 1.8 cm. A nearby cyst in this area measures 2.7 x 2.3 x 2.0 cm. No sonographically demonstrable calculus or ureterectasis. Bladder: There is a Foley catheter in the urinary bladder. In spite of the presence of the Foley catheter, there is noted to be significant debris within the urinary bladder. IMPRESSION: Suspect a degree of cystitis  with debris seen in the urinary bladder despite presence of a Foley catheter. A small amount of urine remains in the urinary bladder currently. There are cysts arising from both kidneys. There is slight fullness of the right renal pelvis without caliectasis. There is left renal collecting  system fullness currently compared to prior study. No new renal lesions are evident. Electronically Signed   By: Lowella Grip III M.D.   On: 10/10/2015 14:38    I independently reviewed the above imaging studies.   Impression/Recommendation 80 year old with history of prostate cancer s/p XRT in 2006 with known radiation cystitis s/p TURP of median lobe in 2013 now with sepsis 2/2 to UTI in addition to acute on chronic kidney failure likely due to the combination of sepsis and bladder outlet obstruction. His hematuria is likely multifactorial in origin, due to catheter placement while heparinized in combination with known history of radiation cystitis. This is already scheduled to be evaluated further with cystoscopy in the urology clinic.  - Recommend continuing foley catheter until creatinine nadirs - Start flomax 0.4mg  daily - Once creatinine stabilizes and patient is ambulatory, can remove foley catheter for trial of void prior to discharge - Following foley removal closely monitor post void residuals to ensure complete bladder emptying - We will arrange for outpatient urology follow up  Lolita Rieger 10/11/2015, 4:08 PM    I have seen and examined the patient and agree with the above assessment and plan.  Consider catheter removal on Friday morning after starting tamsulosin tonight and ensuring return of renal function to baseline.

## 2015-10-11 NOTE — Progress Notes (Signed)
ANTICOAGULATION CONSULT NOTE - Follow up [place] for Heparin Indication: Hx DVT (home Xarelto on hold)  No Known Allergies  Patient Measurements: Height: 5\' 10"  (177.8 cm) Weight: 226 lb 10.1 oz (102.8 kg) IBW/kg (Calculated) : 73 Heparin Dosing Weight: 96.3kg  Vital Signs: Temp: 97.4 F (36.3 C) (07/19 0500) Temp Source: Oral (07/19 0500) BP: 140/63 mmHg (07/19 1050) Pulse Rate: 80 (07/19 1050)  Labs:  Recent Labs  10/09/15 0324 10/10/15 0509 10/11/15 0531  HGB 10.9* 11.4* 11.1*  HCT 32.4* 34.1* 33.6*  PLT 184 212 256  HEPARINUNFRC 0.40 0.33 0.49  CREATININE 2.69* 2.47* 2.29*    Estimated Creatinine Clearance: 31.9 mL/min (by C-G formula based on Cr of 2.29).   Medical History: Past Medical History  Diagnosis Date  . Diabetes mellitus   . Hypertension   . OSA on CPAP   . GERD (gastroesophageal reflux disease)   . History of prostate cancer 2010-- S/P  EXTERNAL RADIATION  . Hematuria   . Bladder calculi   . Hydronephrosis, left   . Dyspnea on exertion   . Arthritis HANDS/ THUMBS/ SHOULDERS  . Chronic obstructive asthma, unspecified FOLLOWED BY DR Tarri Fuller YOUNG  VISIT 10-14-2011 IN [facility]  . COPD (chronic obstructive pulmonary disease) ([facility]CC)     Medications:  PTA xarelto starter pack prescribed 08/15/15 however pt did not pick up prescription from pharmacy due to insurance issues, told EDP he was receiving xarelto samples from PCP.  Verified with patient on 7/15 -- he completed xarelto dose pack and taking 20 mg daily PTA.  LD 7/12 @ 9AM.   Assessment: 39 yoM with PMHx prostate cancer, DM, HTN, OSA on CPAP, asthma, and COPD with recent dx acute DVT in May 2017 taking xarelto samples from PCP now presents for evaluation after multiple falls. Pharmacy consulted to start IV heparin as pt has AKI.   Today, 10/11/2015: - Heparin level is therapeutic at 0.49 on 1450 units/hr - Hgb remains low but stable, today at 11.1, Plts low but WNL. - Noted  hematuria today catheter.Per MD note from 7/18, continue heparin drip since Hgb stable. Per note, will need formal urology consult. Pt states that this is not his baseline, but vitals are stable. - Scr elevated but stable, CrCl 32 ml/min  Goal of Therapy:  Heparin level 0.3-0.7 units/ml  Monitor platelets by anticoagulation protocol: Yes   Plan:  - Continue heparin 1450 units/hr - Daily heparin level and CBC - Follow up long-term anticoagulation plan - Monitor hematuria symptoms    Royetta Asal, PharmD, BCPS Pager [phone] 10/11/2015 1:18 PM

## 2015-10-11 NOTE — Op Note (Signed)
INDICATIONS: bacteremia PROCEDURE:   Informed consent was obtained prior to the procedure. The risks, benefits and alternatives for the procedure were discussed and the patient comprehended these risks.  Risks include, but are not limited to, cough, sore throat, vomiting, nausea, somnolence, esophageal and stomach trauma or perforation, bleeding, low blood pressure, aspiration, pneumonia, infection, trauma to the teeth and death.    After a procedural time-out, the oropharynx was anesthetized with 20% benzocaine spray.   During this procedure the patient was administered a total of Versed 3 mg and Fentanyl 37.5 mcg to achieve and maintain moderate conscious sedation.  The patient's heart rate, blood pressure, and oxygen saturationweare monitored continuously during the procedure. The period of conscious sedation was 15 minutes, of which I was present face-to-face 100% of this time.  The transesophageal probe was inserted in the esophagus and stomach without difficulty and multiple views were obtained.  The patient was kept under observation until the patient left the procedure room.  The patient left the procedure room in stable condition.   Agitated microbubble saline contrast was not administered.  COMPLICATIONS:    There were no immediate complications.  FINDINGS:  No vegetations. No LA clot. LVH. LVEF 55%, normal wall motion  RECOMMENDATIONS:    No endocarditis seen, treat for other sources of bacteremia.  Time Spent Directly with the Patient:  30 minutes   Brigitta Pricer 10/11/2015, 10:13 AM

## 2015-10-11 NOTE — Progress Notes (Signed)
PROGRESS NOTE  Roy [name] [id number] DOB: 04-05-1935 DOA: 10/06/2015 PCP: Rogers Blocker, MD   LOS: 5 days   Brief Narrative: 80 y.o. male with medical history significant of DM, HTN, OSA, recent DVT in leg in May on Xarelto, remote history of prostate cancer in 2006 with radiation, prolonged remission since then, admitted on 7/14 with weakness/falls and tiredness, found by family on the floor. No syncope/falls. Found to be septic.  Assessment & Plan: Principal Problem:   Sepsis secondary to UTI [facility]) Active Problems:   DM2 (diabetes mellitus, type 2) ([place])   Obstructive sleep apnea   Essential hypertension   CKD (chronic kidney disease) stage 3, GFR 30-59 ml/min   AKI (acute kidney injury) ([place])   UTI (lower urinary tract infection)   Bacteremia due to Enterococcus  Sepsis secondary to UTI / enterococcus bacteremia - sepsis physiology resolved - Blood cultures speciated enterococcus which is sensitive to ampicillin, 1/2 bottles. Not a contaminant - now on ampicillin. ID following. TEE done this AM without vegetations found - He is afebrile. Surveillance cultures pending - Urine cultures on admission ordered however unfortunately not obtained since they were automatically canceled by lab - per [name], since TEE negative, will need total of 14 days of antibiotics, minimum 7 of IV  A fib with RVR / PAF - telemetry showing A fib. He goes in and out of sinus rhythm  - 2D echo obtained and showed ejection fraction 60-65% without wall motion abnormalities. This is likely related to sepsis. - patient's CHA2DS2-VASc Score for Stroke Risk is 4, currently on heparin infusion. We'll plan to switch soon to Eliquis instead of Xarelto due to chronic kidney disease (awaiting urology workup) - continue po diltiazem able to be weaned off of his diltiazem infusion 7/17  Acute on chronic urinary retention - renal US with moderate right-sided pelvicaliectasis, unchanged from 2013. I  discussed his case with [name] from urology. It appears the patient has somewhat of a chronic retention issue, and he recommended placement of a Foley catheter and repeating the ultrasound of his kidneys 24 hours later to reevaluate whether this will improve the hydronephrosis.  - foley placed 7/17, repeat US done today 7/19---> shows resolving hydronephrosis - renal function slightly better after foley. - pink urine when foley was placed, clearing some today but still hematuria - consulted Urology today, will see for the hematuria  AKI on CKD 3 - initially received IVF in the setting of sepsis however became tachypneic and SOB 7/15 needing Lasix.  - Patient has been having chronic kidney disease stage III borderline IV as far back as 2014. His creatinine now seems a little bit worse than in the past. This may be related to chronic retention vs natural progression.  DVT - recently diagnosed in May 2017 and on Xarelto since - given renal failure continue heparin infusion. We'll switch to Eliquis once assure no hematuria after foley placement - there was concern in the ER regarding his breathing status and underwent a VQ scan which was low probability for PE.   DM - SSI  Hyperkalemia - in the setting of renal failure, improved after kayexalate - monitor  OSA - continue CPAP   DVT prophylaxis: heparin gtt (for Afib) Code Status: Full Family Communication: No family bedside this morning. Disposition Plan: Home 2-3 days  Consultants:   Infectious disease  Urology  Procedures:   2-D echo - Left ventricle: The cavity size was normal. There was mild focal basal  and moderate concentric hypertrophy of the septum. Systolic function was normal. The estimated ejection fraction was in the range of 60% to 65%. Wall motion was normal; there were no regional wall motion abnormalities. Mitral valve: Moderately calcified annulus. Left atrium: The atrium was moderately  dilated.  Antimicrobials:  Ceftriaxone 7/14 >> 7/17   Vancomycin 7/15 >> 7/17  Ampicillin 7/17 >>  Subjective: No complaints this morning, doing well. No chest pain, no shortness of breath after TEE.  Objective: Filed Vitals:   10/11/15 1050 10/11/15 1343 10/11/15 1414 10/11/15 1418  BP: 140/63 145/66    Pulse: 80 86    Temp:  97.7 F (36.5 C)    TempSrc:  Oral    Resp: 25 18    Height:      Weight:      SpO2: 94% 99% 97% 97%    Intake/Output Summary (Last 24 hours) at 10/11/15 1446 Last data filed at 10/11/15 1330  Gross per 24 hour  Intake    670 ml  Output   2475 ml  Net  -1805 ml   Filed Weights   10/06/15 1811 10/07/15 0400  Weight: 107.956 kg (238 lb) 102.8 kg (226 lb 10.1 oz)    Examination: Constitutional: NAD Filed Vitals:   10/11/15 1050 10/11/15 1343 10/11/15 1414 10/11/15 1418  BP: 140/63 145/66    Pulse: 80 86    Temp:  97.7 F (36.5 C)    TempSrc:  Oral    Resp: 25 18    Height:      Weight:      SpO2: 94% 99% 97% 97%   Eyes: PERRL, lids and conjunctivae normal ENMT: Mucous membranes are moist.  Neck: normal, supple, no masses, no thyromegaly Respiratory: clear to auscultation bilaterally, no wheezing, no crackles. Normal respiratory effort. No accessory muscle use.  Cardiovascular: irregular, no murmurs / rubs / gallops. No LE edema. Abdomen: no tenderness. Bowel sounds positive.  Neurologic: non focal    Data Reviewed: I have personally reviewed following labs and imaging studies  CBC:  Recent Labs Lab 10/06/15 1516 10/07/15 0509 10/08/15 0329 10/09/15 0324 10/10/15 0509 10/11/15 0531  WBC 19.1* 15.7* 12.9* 11.1* 11.9* 9.5  NEUTROABS 17.8*  --   --   --   --   --   HGB 14.8 12.5* 12.2* 10.9* 11.4* 11.1*  HCT 43.1 36.7* 35.6* 32.4* 34.1* 33.6*  MCV 91.3 91.5 91.0 90.5 91.4 91.6  PLT 225 193 190 184 212 [id number]   Basic Metabolic Panel:  Recent Labs Lab 10/07/15 0509 10/08/15 0329 10/09/15 0324 10/10/15 0509  10/11/15 0531  NA 138 136 141 141 141  K 4.5 3.6 3.6 4.1 3.8  CL 110 109 112* 115* 114*  CO2 22 20* 21* 21* 22  GLUCOSE 200* 85 95 128* 116*  BUN 52* 56* 58* 52* 45*  CREATININE 2.66* 2.76* 2.69* 2.47* 2.29*  CALCIUM 9.9 9.9 10.3 10.4* 10.6*   GFR: Estimated Creatinine Clearance: 31.9 mL/min (by C-G formula based on Cr of 2.29). Liver Function Tests: No results for input(s): AST, ALT, ALKPHOS, BILITOT, PROT, ALBUMIN in the last 168 hours. No results for input(s): LIPASE, AMYLASE in the last 168 hours. No results for input(s): AMMONIA in the last 168 hours. Coagulation Profile: No results for input(s): INR, PROTIME in the last 168 hours. Cardiac Enzymes: No results for input(s): CKTOTAL, CKMB, CKMBINDEX, TROPONINI in the last 168 hours. BNP (last 3 results) No results for input(s): PROBNP in the last 8760 hours. HbA1C:  No results for input(s): HGBA1C in the last 72 hours. CBG:  Recent Labs Lab 10/10/15 2045 10/11/15 0035 10/11/15 0417 10/11/15 0800 10/11/15 1152  GLUCAP 129* 118* 115* 106* 110*   Lipid Profile: No results for input(s): CHOL, HDL, LDLCALC, TRIG, CHOLHDL, LDLDIRECT in the last 72 hours. Thyroid Function Tests: No results for input(s): TSH, T4TOTAL, FREET4, T3FREE, THYROIDAB in the last 72 hours. Anemia Panel: No results for input(s): VITAMINB12, FOLATE, FERRITIN, TIBC, IRON, RETICCTPCT in the last 72 hours. Urine analysis:    Component Value Date/Time   COLORURINE AMBER* 10/06/2015 1811   APPEARANCEUR TURBID* 10/06/2015 1811   LABSPEC 1.018 10/06/2015 1811   PHURINE 5.5 10/06/2015 1811   GLUCOSEU NEGATIVE 10/06/2015 1811   HGBUR LARGE* 10/06/2015 1811   BILIRUBINUR NEGATIVE 10/06/2015 1811   KETONESUR NEGATIVE 10/06/2015 1811   PROTEINUR >300* 10/06/2015 1811   UROBILINOGEN 0.2 09/26/2012 1222   NITRITE POSITIVE* 10/06/2015 1811   LEUKOCYTESUR LARGE* 10/06/2015 1811   Sepsis Labs: Invalid input(s): PROCALCITONIN, LACTICIDVEN  Recent Results  (from the past 240 hour(s))  Blood culture (routine x 2)     Status: Abnormal   Collection Time: 10/06/15  3:15 PM  Result Value Ref Range Status   Specimen Description BLOOD RIGHT ANTECUBITAL  Final   Special Requests BOTTLES DRAWN AEROBIC AND ANAEROBIC 5CC  Final   Culture  Setup Time   Final    GRAM POSITIVE COCCI IN CHAINS ANAEROBIC BOTTLE ONLY CRITICAL RESULT CALLED TO, READ BACK BY AND VERIFIED WITH: K. HASKINS RN AT [id number] 10/07/15 BY D. VANHOOK    Culture (A)  Final    ENTEROCOCCUS SPECIES STAPHYLOCOCCUS SPECIES (COAGULASE NEGATIVE) THE SIGNIFICANCE OF ISOLATING THIS ORGANISM FROM A SINGLE SET OF BLOOD CULTURES WHEN MULTIPLE SETS ARE DRAWN IS UNCERTAIN. PLEASE NOTIFY THE MICROBIOLOGY DEPARTMENT WITHIN ONE WEEK IF SPECIATION AND SENSITIVITIES ARE REQUIRED. Performed at [facility]    Report Status 10/10/2015 FINAL  Final   Organism ID, Bacteria ENTEROCOCCUS SPECIES  Final      Susceptibility   Enterococcus species - MIC*    AMPICILLIN <=2 SENSITIVE Sensitive     VANCOMYCIN 1 SENSITIVE Sensitive     GENTAMICIN SYNERGY SENSITIVE Sensitive     * ENTEROCOCCUS SPECIES  Blood culture (routine x 2)     Status: None (Preliminary result)   Collection Time: 10/06/15  4:33 PM  Result Value Ref Range Status   Specimen Description BLOOD LEFT HAND  Final   Special Requests IN PEDIATRIC BOTTLE 2.5CC  Final   Culture   Final    NO GROWTH 4 DAYS Performed at [facility]    Report Status PENDING  Incomplete  MRSA PCR Screening     Status: None   Collection Time: 10/06/15 10:34 PM  Result Value Ref Range Status   MRSA by PCR NEGATIVE NEGATIVE Final    Comment:        The GeneXpert MRSA Assay (FDA approved for NASAL specimens only), is one component of a comprehensive MRSA colonization surveillance program. It is not intended to diagnose MRSA infection nor to guide or monitor treatment for MRSA infections.   Rapid strep screen (not at [facility])     Status: None    Collection Time: 10/07/15  9:45 AM  Result Value Ref Range Status   Streptococcus, Group A Screen (Direct) NEGATIVE NEGATIVE Final    Comment: (NOTE) A Rapid Antigen test may result negative if the antigen level in the sample is below the detection level of this  test. The FDA has not cleared this test as a stand-alone test therefore the rapid antigen negative result has reflexed to a Group A Strep culture.   Culture, group A strep     Status: None   Collection Time: 10/07/15  9:45 AM  Result Value Ref Range Status   Specimen Description THROAT  Final   Special Requests NONE Reflexed from [id number]  Final   Culture   Final    NO GROUP A STREP (S.PYOGENES) ISOLATED Performed at [facility]    Report Status 10/10/2015 FINAL  Final  Culture, blood (Routine X 2) w Reflex to ID Panel     Status: None (Preliminary result)   Collection Time: 10/09/15  3:38 PM  Result Value Ref Range Status   Specimen Description BLOOD LEFT HAND  Final   Special Requests BOTTLES DRAWN AEROBIC AND ANAEROBIC 5CC  Final   Culture   Final    NO GROWTH < 24 HOURS Performed at [facility]    Report Status PENDING  Incomplete    Radiology Studies: US Renal  10/11/2015  CLINICAL DATA:  80 year old male with chronic renal disease, obstructive uropathy. Subsequent encounter. EXAM: RENAL / URINARY TRACT ULTRASOUND COMPLETE COMPARISON:  Recent renal ultrasounds including 10/10/2015. CT Abdomen and Pelvis 10/15/2011 FINDINGS: Right Kidney: Length: 15.5 cm. Mild right hydronephrosis has resolved since 10/07/2015. Simple renal cysts again noted. Left Kidney: Length: 12.6 cm. Mild to moderate left hydronephrosis has resolved since 10/07/2015. Simple left renal cysts re- demonstrated. Bladder: Shadowing bladder calculi. Diminutive bladder volume, Foley catheter balloon visible within the bladder. IMPRESSION: 1. Bilateral hydronephrosis has resolved since 10/07/2015. 2. Chronic bladder calculi. 3. Numerous simple  bilateral renal cysts, inconsequential. Electronically Signed   By: Genevie Ann M.D.   On: 10/11/2015 13:55   US Renal  10/10/2015  CLINICAL DATA:  Question of hydronephrosis on recent prior ultrasound examination EXAM: RENAL / URINARY TRACT ULTRASOUND COMPLETE COMPARISON:  October 07, 2015 FINDINGS: Right Kidney: Length: 14.5 cm. Echogenicity and renal cortical thickness are within normal limits. There is no perinephric fluid. There is slight fullness of the right renal pelvis. There is no caliectasis seen. There is a focal cyst arising from the upper pole of the right kidney measuring 4 3.6 x 3.7 x 4.4 cm. A second cyst in the upper to mid portion of the right kidney measures 1.8 x 2.0 x 1.9 cm. No sonographically demonstrable calculus or ureterectasis. Left Kidney: Length: 12.9 cm. Echogenicity is within normal limits. Renal cortical thickness is low normal. No perinephric fluid visualized. There is no appreciable hydronephrosis on the left. There is a cyst arising from the mid to lower portion left kidney measuring 4.7 x 4.2 x 5.4 cm. There is a cyst in the periphery of the left mid kidney measuring 2.2 x 2.4 x 1.8 cm. A nearby cyst in this area measures 2.7 x 2.3 x 2.0 cm. No sonographically demonstrable calculus or ureterectasis. Bladder: There is a Foley catheter in the urinary bladder. In spite of the presence of the Foley catheter, there is noted to be significant debris within the urinary bladder. IMPRESSION: Suspect a degree of cystitis with debris seen in the urinary bladder despite presence of a Foley catheter. A small amount of urine remains in the urinary bladder currently. There are cysts arising from both kidneys. There is slight fullness of the right renal pelvis without caliectasis. There is left renal collecting system fullness currently compared to prior study. No new renal lesions are  evident. Electronically Signed   By: Lowella Grip III M.D.   On: 10/10/2015 14:38   Scheduled Meds: .  amitriptyline  10 mg Oral QHS  . ampicillin (OMNIPEN) IV  2 g Intravenous Q6H  . calcium carbonate  1,250 mg Oral Q breakfast  . diltiazem  60 mg Oral Q6H  . gabapentin  100 mg Oral Daily  . insulin aspart  0-9 Units Subcutaneous Q4H  . levalbuterol  0.63 mg Nebulization BID  . QUEtiapine  25 mg Oral QHS   Continuous Infusions: . sodium chloride 10 mL/hr at 10/11/15 0915  . diltiazem (CARDIZEM) infusion Stopped (10/09/15 1114)  . heparin 1,450 Units/hr (10/11/15 0612)   Marzetta Board, MD, PhD [facility] Pager [phone]  If 7PM-7AM, please contact night-coverage [email or web address] Password [facility] 10/11/2015, 2:46 PM

## 2015-10-11 NOTE — Progress Notes (Signed)
RT placed patient on CPAP. Sterile water was added to water chamber for humidification. Patient pressure is auto 8-20 cmH2O. Patient is tolerating well. RT will continue to monitor.

## 2015-10-11 NOTE — Progress Notes (Signed)
INFECTIOUS DISEASE PROGRESS NOTE  ID: Roy [name] is a 80 y.o. male with  Principal Problem:   Sepsis secondary to UTI [facility]) Active Problems:   DM2 (diabetes mellitus, type 2) ([place])   Obstructive sleep apnea   Essential hypertension   CKD (chronic kidney disease) stage 3, GFR 30-59 ml/min   AKI (acute kidney injury) ([place])   UTI (lower urinary tract infection)   Bacteremia due to Enterococcus  Subjective: Without complaints  Abtx:  Anti-infectives    Start     Dose/Rate Route Frequency Ordered Stop   10/09/15 1200  ampicillin (OMNIPEN) 2 g in sodium chloride 0.9 % 50 mL IVPB     2 g 150 mL/hr over 20 Minutes Intravenous Every 6 hours 10/09/15 1039     10/08/15 1800  vancomycin (VANCOCIN) 1,500 mg in sodium chloride 0.9 % 500 mL IVPB  Status:  Discontinued     1,500 mg 250 mL/hr over 120 Minutes Intravenous Every 48 hours 10/07/15 1027 10/09/15 1039   10/07/15 0000  cefTRIAXone (ROCEPHIN) 1 g in dextrose 5 % 50 mL IVPB  Status:  Discontinued     1 g 100 mL/hr over 30 Minutes Intravenous Every 24 hours 10/06/15 1926 10/09/15 1039   10/06/15 1900  cefTRIAXone (ROCEPHIN) 1 g in dextrose 5 % 50 mL IVPB  Status:  Discontinued     1 g 100 mL/hr over 30 Minutes Intravenous  Once 10/06/15 1859 10/06/15 1907   10/06/15 1800  vancomycin (VANCOCIN) 1,500 mg in sodium chloride 0.9 % 500 mL IVPB  Status:  Discontinued     1,500 mg 250 mL/hr over 120 Minutes Intravenous Every 48 hours 10/06/15 1750 10/06/15 1926   10/06/15 1745  piperacillin-tazobactam (ZOSYN) IVPB 3.375 g     3.375 g 100 mL/hr over 30 Minutes Intravenous  Once 10/06/15 1741 10/06/15 1844      Medications:  Scheduled: . amitriptyline  10 mg Oral QHS  . ampicillin (OMNIPEN) IV  2 g Intravenous Q6H  . calcium carbonate  1,250 mg Oral Q breakfast  . diltiazem  60 mg Oral Q6H  . gabapentin  100 mg Oral Daily  . insulin aspart  0-9 Units Subcutaneous Q4H  . levalbuterol  0.63 mg Nebulization BID  . QUEtiapine   25 mg Oral QHS    Objective: Vital signs in last 24 hours: Temp:  [97.4 F (36.3 C)-98.2 F (36.8 C)] 97.7 F (36.5 C) (07/19 1343) Pulse Rate:  [73-92] 86 (07/19 1343) Resp:  [16-26] 18 (07/19 1343) BP: (140-195)/(63-133) 145/66 mmHg (07/19 1343) SpO2:  [94 %-100 %] 97 % (07/19 1418)   General appearance: alert, cooperative and no distress Resp: clear to auscultation bilaterally Cardio: regular rate and rhythm GI: normal findings: bowel sounds normal and soft, non-tender  Lab Results  Recent Labs  10/10/15 0509 10/11/15 0531  WBC 11.9* 9.5  HGB 11.4* 11.1*  HCT 34.1* 33.6*  NA 141 141  K 4.1 3.8  CL 115* 114*  CO2 21* 22  BUN 52* 45*  CREATININE 2.47* 2.29*   Liver Panel No results for input(s): PROT, ALBUMIN, AST, ALT, ALKPHOS, BILITOT, BILIDIR, IBILI in the last 72 hours. Sedimentation Rate No results for input(s): ESRSEDRATE in the last 72 hours. C-Reactive Protein No results for input(s): CRP in the last 72 hours.  Microbiology: Recent Results (from the past 240 hour(s))  Blood culture (routine x 2)     Status: Abnormal   Collection Time: 10/06/15  3:15 PM  Result Value  Ref Range Status   Specimen Description BLOOD RIGHT ANTECUBITAL  Final   Special Requests BOTTLES DRAWN AEROBIC AND ANAEROBIC 5CC  Final   Culture  Setup Time   Final    GRAM POSITIVE COCCI IN CHAINS ANAEROBIC BOTTLE ONLY CRITICAL RESULT CALLED TO, READ BACK BY AND VERIFIED WITH: K. HASKINS RN AT [id number] 10/07/15 BY D. VANHOOK    Culture (A)  Final    ENTEROCOCCUS SPECIES STAPHYLOCOCCUS SPECIES (COAGULASE NEGATIVE) THE SIGNIFICANCE OF ISOLATING THIS ORGANISM FROM A SINGLE SET OF BLOOD CULTURES WHEN MULTIPLE SETS ARE DRAWN IS UNCERTAIN. PLEASE NOTIFY THE MICROBIOLOGY DEPARTMENT WITHIN ONE WEEK IF SPECIATION AND SENSITIVITIES ARE REQUIRED. Performed at [facility]    Report Status 10/10/2015 FINAL  Final   Organism ID, Bacteria ENTEROCOCCUS SPECIES  Final      Susceptibility    Enterococcus species - MIC*    AMPICILLIN <=2 SENSITIVE Sensitive     VANCOMYCIN 1 SENSITIVE Sensitive     GENTAMICIN SYNERGY SENSITIVE Sensitive     * ENTEROCOCCUS SPECIES  Blood culture (routine x 2)     Status: None   Collection Time: 10/06/15  4:33 PM  Result Value Ref Range Status   Specimen Description BLOOD LEFT HAND  Final   Special Requests IN PEDIATRIC BOTTLE 2.5CC  Final   Culture   Final    NO GROWTH 5 DAYS Performed at [facility]    Report Status 10/11/2015 FINAL  Final  MRSA PCR Screening     Status: None   Collection Time: 10/06/15 10:34 PM  Result Value Ref Range Status   MRSA by PCR NEGATIVE NEGATIVE Final    Comment:        The GeneXpert MRSA Assay (FDA approved for NASAL specimens only), is one component of a comprehensive MRSA colonization surveillance program. It is not intended to diagnose MRSA infection nor to guide or monitor treatment for MRSA infections.   Rapid strep screen (not at [facility])     Status: None   Collection Time: 10/07/15  9:45 AM  Result Value Ref Range Status   Streptococcus, Group A Screen (Direct) NEGATIVE NEGATIVE Final    Comment: (NOTE) A Rapid Antigen test may result negative if the antigen level in the sample is below the detection level of this test. The FDA has not cleared this test as a stand-alone test therefore the rapid antigen negative result has reflexed to a Group A Strep culture.   Culture, group A strep     Status: None   Collection Time: 10/07/15  9:45 AM  Result Value Ref Range Status   Specimen Description THROAT  Final   Special Requests NONE Reflexed from [id number]  Final   Culture   Final    NO GROUP A STREP (S.PYOGENES) ISOLATED Performed at [facility]    Report Status 10/10/2015 FINAL  Final  Culture, blood (Routine X 2) w Reflex to ID Panel     Status: None (Preliminary result)   Collection Time: 10/09/15  3:38 PM  Result Value Ref Range Status   Specimen Description BLOOD LEFT HAND   Final   Special Requests BOTTLES DRAWN AEROBIC AND ANAEROBIC 5CC  Final   Culture   Final    NO GROWTH 2 DAYS Performed at [facility]    Report Status PENDING  Incomplete    Studies/Results: US Renal  10/11/2015  CLINICAL DATA:  80 year old male with chronic renal disease, obstructive uropathy. Subsequent encounter. EXAM: RENAL / URINARY TRACT  ULTRASOUND COMPLETE COMPARISON:  Recent renal ultrasounds including 10/10/2015. CT Abdomen and Pelvis 10/15/2011 FINDINGS: Right Kidney: Length: 15.5 cm. Mild right hydronephrosis has resolved since 10/07/2015. Simple renal cysts again noted. Left Kidney: Length: 12.6 cm. Mild to moderate left hydronephrosis has resolved since 10/07/2015. Simple left renal cysts re- demonstrated. Bladder: Shadowing bladder calculi. Diminutive bladder volume, Foley catheter balloon visible within the bladder. IMPRESSION: 1. Bilateral hydronephrosis has resolved since 10/07/2015. 2. Chronic bladder calculi. 3. Numerous simple bilateral renal cysts, inconsequential. Electronically Signed   By: Genevie Ann M.D.   On: 10/11/2015 13:55   US Renal  10/10/2015  CLINICAL DATA:  Question of hydronephrosis on recent prior ultrasound examination EXAM: RENAL / URINARY TRACT ULTRASOUND COMPLETE COMPARISON:  October 07, 2015 FINDINGS: Right Kidney: Length: 14.5 cm. Echogenicity and renal cortical thickness are within normal limits. There is no perinephric fluid. There is slight fullness of the right renal pelvis. There is no caliectasis seen. There is a focal cyst arising from the upper pole of the right kidney measuring 4 3.6 x 3.7 x 4.4 cm. A second cyst in the upper to mid portion of the right kidney measures 1.8 x 2.0 x 1.9 cm. No sonographically demonstrable calculus or ureterectasis. Left Kidney: Length: 12.9 cm. Echogenicity is within normal limits. Renal cortical thickness is low normal. No perinephric fluid visualized. There is no appreciable hydronephrosis on the left. There is a  cyst arising from the mid to lower portion left kidney measuring 4.7 x 4.2 x 5.4 cm. There is a cyst in the periphery of the left mid kidney measuring 2.2 x 2.4 x 1.8 cm. A nearby cyst in this area measures 2.7 x 2.3 x 2.0 cm. No sonographically demonstrable calculus or ureterectasis. Bladder: There is a Foley catheter in the urinary bladder. In spite of the presence of the Foley catheter, there is noted to be significant debris within the urinary bladder. IMPRESSION: Suspect a degree of cystitis with debris seen in the urinary bladder despite presence of a Foley catheter. A small amount of urine remains in the urinary bladder currently. There are cysts arising from both kidneys. There is slight fullness of the right renal pelvis without caliectasis. There is left renal collecting system fullness currently compared to prior study. No new renal lesions are evident. Electronically Signed   By: Lowella Grip III M.D.   On: 10/10/2015 14:38     Assessment/Plan: Sepsis- enterococci UTI ARF Constipation  Would Continue ampicillin, aim for 2 week course. If he is improved enough to go home prior to end of this, could consider amoxicillin to complete course.  TEE (-) for endocarditis He also had CNS in BCx. This did not appear in his repeat Cx, and not in his f/u Cx (he received 3 days of vanco). Suspect this was a contaminant.  Repeat BCx ngtd Prev Cr 1.95, improved today to 2.29 today  Available as needed.   Total days of antibiotics: 5 ampicillin         Bobby Rumpf Infectious Diseases (pager) [phone] [email or web address] 10/11/2015, 4:59 PM  LOS: 5 days

## 2015-10-11 NOTE — Progress Notes (Signed)
Echocardiogram Echocardiogram Transesophageal has been performed.  Tresa Res 10/11/2015, 12:56 PM

## 2015-10-12 ENCOUNTER — Encounter (HOSPITAL_COMMUNITY): Payer: Self-pay | Admitting: Cardiovascular Disease

## 2015-10-12 DIAGNOSIS — R319 Hematuria, unspecified: Secondary | ICD-10-CM

## 2015-10-12 LAB — CBC
HCT: 33.5 % — ABNORMAL LOW (ref 39.0–52.0)
Hemoglobin: 10.9 g/dL — ABNORMAL LOW (ref 13.0–17.0)
MCH: 30.3 pg (ref 26.0–34.0)
MCHC: 32.5 g/dL (ref 30.0–36.0)
MCV: 93.1 fL (ref 78.0–100.0)
PLATELETS: 273 10*3/uL (ref 150–400)
RBC: 3.6 MIL/uL — AB (ref 4.22–5.81)
RDW: 15 % (ref 11.5–15.5)
WBC: 8.8 10*3/uL (ref 4.0–10.5)

## 2015-10-12 LAB — GLUCOSE, CAPILLARY
GLUCOSE-CAPILLARY: 108 mg/dL — AB (ref 65–99)
Glucose-Capillary: 108 mg/dL — ABNORMAL HIGH (ref 65–99)
Glucose-Capillary: 98 mg/dL (ref 65–99)
Glucose-Capillary: 115 mg/dL — ABNORMAL HIGH (ref 65–99)
Glucose-Capillary: 119 mg/dL — ABNORMAL HIGH (ref 65–99)

## 2015-10-12 LAB — HEPARIN LEVEL (UNFRACTIONATED): Heparin Unfractionated: 0.47 IU/mL (ref 0.30–0.70)

## 2015-10-12 MED ORDER — TAMSULOSIN HCL 0.4 MG PO CAPS
0.4000 mg | ORAL_CAPSULE | Freq: Every day | ORAL | Status: DC
  Administered 2015-10-12 – 2015-10-18 (×7): 0.4 mg via ORAL
  Filled 2015-10-12 (×7): qty 1

## 2015-10-12 MED ORDER — INSULIN ASPART 100 UNIT/ML ~~LOC~~ SOLN
0.0000 [IU] | Freq: Three times a day (TID) | SUBCUTANEOUS | Status: DC
  Administered 2015-10-13 – 2015-10-15 (×3): 1 [IU] via SUBCUTANEOUS
  Administered 2015-10-15: 2 [IU] via SUBCUTANEOUS
  Administered 2015-10-15 – 2015-10-17 (×3): 1 [IU] via SUBCUTANEOUS

## 2015-10-12 NOTE — Progress Notes (Signed)
ANTICOAGULATION CONSULT NOTE - Follow up [facility] for Heparin Indication: Hx DVT (home Xarelto on hold)  No Known Allergies  Patient Measurements: Height: 5\' 10"  (177.8 cm) Weight: 226 lb 10.1 oz (102.8 kg) IBW/kg (Calculated) : 73 Heparin Dosing Weight: 96.3kg  Vital Signs: Temp: 98 F (36.7 C) (07/20 0407) Temp Source: Oral (07/20 0407) BP: 138/75 mmHg (07/20 0407) Pulse Rate: 84 (07/20 0851)  Labs:  Recent Labs  10/10/15 0509 10/11/15 0531 10/12/15 0544  HGB 11.4* 11.1* 10.9*  HCT 34.1* 33.6* 33.5*  PLT 212 256 273  HEPARINUNFRC 0.33 0.49 0.47  CREATININE 2.47* 2.29*  --     Estimated Creatinine Clearance: 31.9 mL/min (by C-G formula based on Cr of 2.29).   Medical History: Past Medical History  Diagnosis Date  . Diabetes mellitus   . Hypertension   . OSA on CPAP   . GERD (gastroesophageal reflux disease)   . History of prostate cancer 2010-- S/P  EXTERNAL RADIATION  . Hematuria   . Bladder calculi   . Hydronephrosis, left   . Dyspnea on exertion   . Arthritis HANDS/ THUMBS/ SHOULDERS  . Chronic obstructive asthma, unspecified FOLLOWED BY DR Tarri Fuller YOUNG  VISIT 10-14-2011 IN [facility]  . COPD (chronic obstructive pulmonary disease) (HCC)     Medications:  PTA xarelto starter pack prescribed 08/15/15 however pt did not pick up prescription from pharmacy due to insurance issues, told EDP he was receiving xarelto samples from PCP.  Verified with patient on 7/15 -- he completed xarelto dose pack and taking 20 mg daily PTA.  LD 7/12 @ 9AM.   Assessment: 80 yoM with PMHx prostate cancer, DM, HTN, OSA on CPAP, asthma, and COPD with recent dx acute DVT in May 2017 taking xarelto samples from PCP now presents for evaluation after multiple falls. Pharmacy consulted to start IV heparin as pt has AKI.   Today, 10/12/2015: - Heparin level is therapeutic at 0.47 on 1450 units/hr - Hgb remains low but stable, today at 11.1, Plts low but WNL. - Noted  hematuria today catheter.Per MD note from 7/18, continue heparin drip since Hgb stable. Per urology note, hematuria is likely multifactorial in origin, due to catheter placement while heparinized in combination with known history of radiation cystitis. This is already scheduled to be evaluated further with cystoscopy in the urology clinic. - Scr elevated but stable, CrCl 32 ml/min, labs from 7/19  Goal of Therapy:  Heparin level 0.3-0.7 units/ml  Monitor platelets by anticoagulation protocol: Yes   Plan:  - Continue heparin 1450 units/hr - Daily heparin level and CBC - Follow up long-term anticoagulation plan - Monitor hematuria symptoms    Royetta Asal, PharmD, BCPS Pager [phone] 10/12/2015 11:04 AM

## 2015-10-12 NOTE — Progress Notes (Signed)
Physical Therapy Treatment Patient Details Name: Roy [name] MRN: [id number] DOB: 1935-05-22 Today's Date: 10/12/2015    History of Present Illness 80 y.o. male with medical history significant of DM, HTN, OSA, recent DVT in leg in May on Xarelto, remote history of prostate cancer in 2006 with radiation, prolonged remission since then, admitted on 7/14 with weakness/falls and tiredness, found by family on the floor. No syncope/falls. Found to be septic with A fib with RVR.     PT Comments    Assisted OOB with increased time and amb in hallway.  Follow Up Recommendations  Home health PT;Supervision - Intermittent     Equipment Recommendations  Rolling walker with 5" wheels    Recommendations for Other Services       Precautions / Restrictions Precautions Precautions: Fall Precaution Comments: afib   Restrictions Weight Bearing Restrictions: No    Mobility  Bed Mobility Overal bed mobility: Needs Assistance Bed Mobility: Supine to Sit     Supine to sit: Min assist;HOB elevated     General bed mobility comments: small amount of assist for trunk. Increased time.   Transfers Overall transfer level: Needs assistance Equipment used: Rolling walker (2 wheeled) Transfers: Sit to/from Stand Sit to Stand: Min assist         General transfer comment: small amount of assist to rise, stabilize, control descent.   Ambulation/Gait Ambulation/Gait assistance: Min assist Ambulation Distance (Feet): 175 Feet Assistive device: Rolling walker (2 wheeled) Gait Pattern/deviations: Step-to pattern;Step-through pattern;Decreased step length - right;Decreased step length - left Gait velocity: decreased   General Gait Details: slow gait speed. HR 106 bpm during ambulation. Pt tolerated distance well.    Stairs            Wheelchair Mobility    Modified Rankin (Stroke Patients Only)       Balance                                    Cognition  Arousal/Alertness: Awake/alert Behavior During Therapy: WFL for tasks assessed/performed Overall Cognitive Status: Within Functional Limits for tasks assessed                      Exercises      General Comments        Pertinent Vitals/Pain Pain Assessment: No/denies pain    Home Living                      Prior Function            PT Goals (current goals can now be found in the care plan section) Progress towards PT goals: Progressing toward goals    Frequency  Min 3X/week    PT Plan Current plan remains appropriate    Co-evaluation             End of Session Equipment Utilized During Treatment: Gait belt Activity Tolerance: Patient tolerated treatment well Patient left: in chair;with call bell/phone within reach;with chair alarm set     Time: [id number] PT Time Calculation (min) (ACUTE ONLY): 23 min  Charges:  $Gait Training: 8-22 mins $Therapeutic Activity: 8-22 mins                    G Codes:      Roy [name]  PTA [facility]  Acute  Rehab Pager      [phone]

## 2015-10-12 NOTE — Progress Notes (Addendum)
PROGRESS NOTE  Roy [name] [id number] DOB: May 19, 1935 DOA: 10/06/2015 PCP: Rogers Blocker, MD   LOS: 6 days   Brief Narrative: 80 y.o. male with medical history significant of DM, HTN, OSA, recent DVT in leg in May on Xarelto, remote history of prostate cancer in 2006 with radiation, prolonged remission since then, admitted on 7/14 with weakness/falls and tiredness, found by family on the floor. Found to be septic.  Assessment & Plan: Principal Problem:   Sepsis secondary to UTI [facility]) Active Problems:   DM2 (diabetes mellitus, type 2) ([place])   Obstructive sleep apnea   Essential hypertension   CKD (chronic kidney disease) stage 3, GFR 30-59 ml/min   AKI (acute kidney injury) ([place])   UTI (lower urinary tract infection)   Bacteremia due to Enterococcus  Sepsis secondary to UTI / enterococcus bacteremia - sepsis physiology resolved - Blood cultures speciated enterococcus which is sensitive to ampicillin, 1/2 bottles. Not a contaminant - now on ampicillin. ID following. TEE without vegetations found - Urine cultures on admission ordered however unfortunately not obtained since they were automatically canceled by lab - per [name], since TEE negative, will need total of 14 days of antibiotics, minimum 7 of IV - As per ID note 7/19: Continue ampicillin and aim for a total 2 week course. If he is improved enough to go home prior to end of this, could consider amoxicillin to complete course. He also had coagulase-negative staph in blood culture. This did not appear in his repeat culture, and not in his follow-up culture (he received 3 days of vancomycin). Suspect this was a contaminant. Repeat blood cultures negative to date. ID signed off 7/19.  A fib with RVR / PAF - 2D echo obtained and showed ejection fraction 60-65% without wall motion abnormalities. This is likely related to sepsis. - patient's CHA2DS2-VASc Score for Stroke Risk is 4, currently on heparin infusion. We'll plan  to switch soon to Eliquis instead of Xarelto due to chronic kidney disease (awaiting urology workup) - continue po diltiazem able to be weaned off of his diltiazem infusion 7/17 - Telemetry with controlled ventricular rate.  Acute on chronic urinary retention - renal US with moderate right-sided pelvicaliectasis, unchanged from 2013. His case was discussed with [name] from urology. It appears the patient has somewhat of a chronic retention issue, and he recommended placement of a Foley catheter and repeating the ultrasound of his kidneys 24 hours later to reevaluate whether this will improve the hydronephrosis.  - foley placed 7/17, repeat US done today 7/19---> shows resolving hydronephrosis - Urology consultation for hematuria appreciated. His hematuria is likely multifactorial in origin due to catheter placement while heparinized in combination with known history of radiation cystitis. He is already scheduled to be evaluated further with cystoscopy in the urology clinic Tuesday, August 1 at 3:45 PM. Neurology recommends continuing Foley for now as he had hydronephrosis and distended bladder before it was placed. - Due to hematuria, source needs to be determined given that patient is going to be on anticoagulation for DVT and A. fib, discussed with urology regarding possibility of performing cystoscopy while on IV heparin infusion which can be briefly held. Discussed with [name], ID who indicated that it is okay to proceed with cystoscopy since patient has received approximately 7 days of IV antibiotics. Updated [name], Urology  AKI on CKD 3 - Baseline creatinine may be in the 1.9 range (May 2017). Presented with creatinine of 2.7. Has gradually improved to  2.29. Follow BMP in a.m.  DVT - recently diagnosed in May 2017 and on Xarelto since - given renal failure continue heparin infusion. We'll switch to Eliquis once assure no hematuria after foley placement - there was concern in the  ER regarding his breathing status and underwent a VQ scan which was low probability for PE.   DM - SSI  Hyperkalemia - resolved  OSA - continue CPAP   HTN - controlled.  Anemia -stable.  DVT prophylaxis: heparin gtt (for Afib) Code Status: Full Family Communication: No family bedside this morning. Disposition Plan: Home 2-3 days  Consultants:   Infectious disease  Urology  Procedures:   2-D echo - Left ventricle: The cavity size was normal. There was mild focal basal and moderate concentric hypertrophy of the septum. Systolic function was normal. The estimated ejection fraction was in the range of 60% to 65%. Wall motion was normal; there were no regional wall motion abnormalities. Mitral valve: Moderately calcified annulus. Left atrium: The atrium was moderately dilated.  Antimicrobials:  Ceftriaxone 7/14 >> 7/17   Vancomycin 7/15 >> 7/17  Ampicillin 7/17 >>  Subjective: Denied complaints. No dyspnea or chest pain. As per RN, hematuria in bag has almost resolved.  Objective: Filed Vitals:   10/11/15 2016 10/11/15 2035 10/12/15 0407 10/12/15 0851  BP: 158/79  138/75   Pulse: 99  67 84  Temp: 99.8 F (37.7 C)  98 F (36.7 C)   TempSrc: Oral  Oral   Resp: 20  18 18   Height:      Weight:      SpO2: 98% 97% 99% 96%    Intake/Output Summary (Last 24 hours) at 10/12/15 1527 Last data filed at 10/12/15 0713  Gross per 24 hour  Intake 351.17 ml  Output   1700 ml  Net -1348.83 ml   Filed Weights   10/06/15 1811 10/07/15 0400  Weight: 107.956 kg (238 lb) 102.8 kg (226 lb 10.1 oz)    Gen: Pleasant elderly male lying comfortably supine in bed. Neck: normal, supple, no masses, no thyromegaly Respiratory: clear to auscultation bilaterally, no wheezing, no crackles. Normal respiratory effort. No accessory muscle use.  Cardiovascular: irregular, no murmurs / rubs / gallops. Trace bilateral leg edema. Telemetry: A. fib with controlled ventricular  rate.. Abdomen: no tenderness. Bowel sounds positive. Foley catheter with pinkish discoloration of urine. Neurologic: non focal . Alert and oriented.   Data Reviewed: I have personally reviewed following labs and imaging studies  CBC:  Recent Labs Lab 10/06/15 1516  10/08/15 0329 10/09/15 0324 10/10/15 0509 10/11/15 0531 10/12/15 0544  WBC 19.1*  < > 12.9* 11.1* 11.9* 9.5 8.8  NEUTROABS 17.8*  --   --   --   --   --   --   HGB 14.8  < > 12.2* 10.9* 11.4* 11.1* 10.9*  HCT 43.1  < > 35.6* 32.4* 34.1* 33.6* 33.5*  MCV 91.3  < > 91.0 90.5 91.4 91.6 93.1  PLT 225  < > 190 184 212 256 273  < > = values in this interval not displayed. Basic Metabolic Panel:  Recent Labs Lab 10/07/15 0509 10/08/15 0329 10/09/15 0324 10/10/15 0509 10/11/15 0531  NA 138 136 141 141 141  K 4.5 3.6 3.6 4.1 3.8  CL 110 109 112* 115* 114*  CO2 22 20* 21* 21* 22  GLUCOSE 200* 85 95 128* 116*  BUN 52* 56* 58* 52* 45*  CREATININE 2.66* 2.76* 2.69* 2.47* 2.29*  CALCIUM 9.9 9.9  10.3 10.4* 10.6*   GFR: Estimated Creatinine Clearance: 31.9 mL/min (by C-G formula based on Cr of 2.29). Liver Function Tests: No results for input(s): AST, ALT, ALKPHOS, BILITOT, PROT, ALBUMIN in the last 168 hours. No results for input(s): LIPASE, AMYLASE in the last 168 hours. No results for input(s): AMMONIA in the last 168 hours. Coagulation Profile: No results for input(s): INR, PROTIME in the last 168 hours. Cardiac Enzymes: No results for input(s): CKTOTAL, CKMB, CKMBINDEX, TROPONINI in the last 168 hours. BNP (last 3 results) No results for input(s): PROBNP in the last 8760 hours. HbA1C: No results for input(s): HGBA1C in the last 72 hours. CBG:  Recent Labs Lab 10/11/15 2012 10/11/15 2348 10/12/15 0405 10/12/15 0745 10/12/15 1130  GLUCAP 152* 121* 108* 108* 98   Lipid Profile: No results for input(s): CHOL, HDL, LDLCALC, TRIG, CHOLHDL, LDLDIRECT in the last 72 hours. Thyroid Function Tests: No  results for input(s): TSH, T4TOTAL, FREET4, T3FREE, THYROIDAB in the last 72 hours. Anemia Panel: No results for input(s): VITAMINB12, FOLATE, FERRITIN, TIBC, IRON, RETICCTPCT in the last 72 hours. Urine analysis:    Component Value Date/Time   COLORURINE AMBER* 10/06/2015 1811   APPEARANCEUR TURBID* 10/06/2015 1811   LABSPEC 1.018 10/06/2015 1811   PHURINE 5.5 10/06/2015 1811   GLUCOSEU NEGATIVE 10/06/2015 1811   HGBUR LARGE* 10/06/2015 1811   BILIRUBINUR NEGATIVE 10/06/2015 1811   KETONESUR NEGATIVE 10/06/2015 1811   PROTEINUR >300* 10/06/2015 1811   UROBILINOGEN 0.2 09/26/2012 1222   NITRITE POSITIVE* 10/06/2015 1811   LEUKOCYTESUR LARGE* 10/06/2015 1811   Sepsis Labs: Invalid input(s): PROCALCITONIN, LACTICIDVEN  Recent Results (from the past 240 hour(s))  Blood culture (routine x 2)     Status: Abnormal   Collection Time: 10/06/15  3:15 PM  Result Value Ref Range Status   Specimen Description BLOOD RIGHT ANTECUBITAL  Final   Special Requests BOTTLES DRAWN AEROBIC AND ANAEROBIC 5CC  Final   Culture  Setup Time   Final    GRAM POSITIVE COCCI IN CHAINS ANAEROBIC BOTTLE ONLY CRITICAL RESULT CALLED TO, READ BACK BY AND VERIFIED WITH: K. HASKINS RN AT [id number] 10/07/15 BY D. VANHOOK    Culture (A)  Final    ENTEROCOCCUS SPECIES STAPHYLOCOCCUS SPECIES (COAGULASE NEGATIVE) THE SIGNIFICANCE OF ISOLATING THIS ORGANISM FROM A SINGLE SET OF BLOOD CULTURES WHEN MULTIPLE SETS ARE DRAWN IS UNCERTAIN. PLEASE NOTIFY THE MICROBIOLOGY DEPARTMENT WITHIN ONE WEEK IF SPECIATION AND SENSITIVITIES ARE REQUIRED. Performed at [facility]    Report Status 10/10/2015 FINAL  Final   Organism ID, Bacteria ENTEROCOCCUS SPECIES  Final      Susceptibility   Enterococcus species - MIC*    AMPICILLIN <=2 SENSITIVE Sensitive     VANCOMYCIN 1 SENSITIVE Sensitive     GENTAMICIN SYNERGY SENSITIVE Sensitive     * ENTEROCOCCUS SPECIES  Blood culture (routine x 2)     Status: None   Collection Time:  10/06/15  4:33 PM  Result Value Ref Range Status   Specimen Description BLOOD LEFT HAND  Final   Special Requests IN PEDIATRIC BOTTLE 2.5CC  Final   Culture   Final    NO GROWTH 5 DAYS Performed at [facility]    Report Status 10/11/2015 FINAL  Final  MRSA PCR Screening     Status: None   Collection Time: 10/06/15 10:34 PM  Result Value Ref Range Status   MRSA by PCR NEGATIVE NEGATIVE Final    Comment:        The  GeneXpert MRSA Assay (FDA approved for NASAL specimens only), is one component of a comprehensive MRSA colonization surveillance program. It is not intended to diagnose MRSA infection nor to guide or monitor treatment for MRSA infections.   Rapid strep screen (not at [facility])     Status: None   Collection Time: 10/07/15  9:45 AM  Result Value Ref Range Status   Streptococcus, Group A Screen (Direct) NEGATIVE NEGATIVE Final    Comment: (NOTE) A Rapid Antigen test may result negative if the antigen level in the sample is below the detection level of this test. The FDA has not cleared this test as a stand-alone test therefore the rapid antigen negative result has reflexed to a Group A Strep culture.   Culture, group A strep     Status: None   Collection Time: 10/07/15  9:45 AM  Result Value Ref Range Status   Specimen Description THROAT  Final   Special Requests NONE Reflexed from [id number]  Final   Culture   Final    NO GROUP A STREP (S.PYOGENES) ISOLATED Performed at [facility]    Report Status 10/10/2015 FINAL  Final  Culture, blood (Routine X 2) w Reflex to ID Panel     Status: None (Preliminary result)   Collection Time: 10/09/15  3:38 PM  Result Value Ref Range Status   Specimen Description BLOOD LEFT HAND  Final   Special Requests BOTTLES DRAWN AEROBIC AND ANAEROBIC 5CC  Final   Culture   Final    NO GROWTH 3 DAYS Performed at [facility]    Report Status PENDING  Incomplete    Radiology Studies: US Renal  10/11/2015  CLINICAL  DATA:  80 year old male with chronic renal disease, obstructive uropathy. Subsequent encounter. EXAM: RENAL / URINARY TRACT ULTRASOUND COMPLETE COMPARISON:  Recent renal ultrasounds including 10/10/2015. CT Abdomen and Pelvis 10/15/2011 FINDINGS: Right Kidney: Length: 15.5 cm. Mild right hydronephrosis has resolved since 10/07/2015. Simple renal cysts again noted. Left Kidney: Length: 12.6 cm. Mild to moderate left hydronephrosis has resolved since 10/07/2015. Simple left renal cysts re- demonstrated. Bladder: Shadowing bladder calculi. Diminutive bladder volume, Foley catheter balloon visible within the bladder. IMPRESSION: 1. Bilateral hydronephrosis has resolved since 10/07/2015. 2. Chronic bladder calculi. 3. Numerous simple bilateral renal cysts, inconsequential. Electronically Signed   By: Genevie Ann M.D.   On: 10/11/2015 13:55   Scheduled Meds: . amitriptyline  10 mg Oral QHS  . ampicillin (OMNIPEN) IV  2 g Intravenous Q6H  . calcium carbonate  1,250 mg Oral Q breakfast  . diltiazem  60 mg Oral Q6H  . gabapentin  100 mg Oral Daily  . insulin aspart  0-9 Units Subcutaneous Q4H  . QUEtiapine  25 mg Oral QHS  . tamsulosin  0.4 mg Oral Daily   Continuous Infusions: . sodium chloride 10 mL/hr at 10/11/15 0915  . diltiazem (CARDIZEM) infusion Stopped (10/09/15 1114)  . heparin 1,450 Units/hr (10/12/15 0022)     Vernell Leep, MD, FACP, FHM. [facility] Pager [phone]  If 7PM-7AM, please contact night-coverage [email or web address] Password [facility] 10/12/2015, 3:39 PM

## 2015-10-12 NOTE — Care Management Note (Signed)
Case Management Note  Patient Details  Name: Roy [name] MRN: [id number] Date of Birth: March 18, 1936  Subjective/Objective: [facility] rep Santiago Glad following for [facility] HHRN/PT, rw order, f56f. Bacteremia,ARF,afib w/rvr.                   Action/Plan:d/c plan home w/HHC.   Expected Discharge Date:                  Expected Discharge Plan:  [place]  In-House Referral:     Discharge planning Services  CM Consult  Post Acute Care Choice:    Choice offered to:  Patient  DME Arranged:    DME Agency:     HH Arranged:  PT [place]:  [place]  Status of Service:  In process, will continue to follow  If discussed at Long Length of Stay Meetings, dates discussed:    Additional Comments:  Dessa Phi, RN 10/12/2015, 11:30 AM

## 2015-10-12 NOTE — Progress Notes (Addendum)
   Pt without complaint. Feeling better. He was straining to void and feeling incomplete emptying prior to foley. Also, bladder distended and mild HUN on previous U/S.   PE: NAD, sitting in chair eating lunch GU - urine clear yellow in foley tube  Cr down to 2.29. I reviewed renal u/s images - no hydro, foley in bladder. Possible bladder stone vs layering debris.   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  P- Gross hematuria -  His hematuria is likely multifactorial in origin, due to catheter placement while heparinized in combination with known history of radiation cystitis. This is already scheduled to be evaluated further with cystoscopy in the urology clinic Tuesday, Aug 1, 3:45 PM. I would continue foley for now as he had hydro and distended bladder before it was placed. He will be on abx and I'll see back next week in office for cystoscopy and foley removal.  CKD - Cr improving - baseline ~1.9 - no hydro on U/S.   Discussed with Dr. Algis Liming  Addendum: Pt will require continued anticoagulation and it would be best to look for source of hematuria with cystoscopy and be able to proceed with possible cystolithopaxy, TURP or TURBT/bx depending on findings while patient is stable and in hospital. I discussed the possibility earlier with Roy [name] and his sisters. They were hoping to get cysto done while pt in hospital. Dr. Algis Liming and I spoke again and he feels pt stable for procedure, also he spoke with [name] who feels 7 days abx is enough to proceed and I agree. I'll cover pt with IV Levaquin.

## 2015-10-13 ENCOUNTER — Other Ambulatory Visit: Payer: Self-pay | Admitting: Urology

## 2015-10-13 DIAGNOSIS — A419 Sepsis, unspecified organism: Secondary | ICD-10-CM

## 2015-10-13 DIAGNOSIS — R7881 Bacteremia: Secondary | ICD-10-CM

## 2015-10-13 LAB — GLUCOSE, CAPILLARY
GLUCOSE-CAPILLARY: 92 mg/dL (ref 65–99)
Glucose-Capillary: 133 mg/dL — ABNORMAL HIGH (ref 65–99)
Glucose-Capillary: 100 mg/dL — ABNORMAL HIGH (ref 65–99)
Glucose-Capillary: 126 mg/dL — ABNORMAL HIGH (ref 65–99)

## 2015-10-13 LAB — CBC
HEMATOCRIT: 32 % — AB (ref 39.0–52.0)
HEMOGLOBIN: 10.5 g/dL — AB (ref 13.0–17.0)
MCH: 30.7 pg (ref 26.0–34.0)
MCHC: 32.8 g/dL (ref 30.0–36.0)
MCV: 93.6 fL (ref 78.0–100.0)
Platelets: 292 10*3/uL (ref 150–400)
RBC: 3.42 MIL/uL — ABNORMAL LOW (ref 4.22–5.81)
RDW: 14.9 % (ref 11.5–15.5)
WBC: 8.4 10*3/uL (ref 4.0–10.5)

## 2015-10-13 LAB — BASIC METABOLIC PANEL
Anion gap: 4 — ABNORMAL LOW (ref 5–15)
BUN: 33 mg/dL — AB (ref 6–20)
CALCIUM: 10.8 mg/dL — AB (ref 8.9–10.3)
CO2: 24 mmol/L (ref 22–32)
CREATININE: 2.12 mg/dL — AB (ref 0.61–1.24)
Chloride: 115 mmol/L — ABNORMAL HIGH (ref 101–111)
GFR calc Af Amer: 33 mL/min — ABNORMAL LOW (ref >60)
GFR, EST NON AFRICAN AMERICAN: 28 mL/min — AB (ref >60)
Glucose, Bld: 114 mg/dL — ABNORMAL HIGH (ref 65–99)
Potassium: 3.7 mmol/L (ref 3.5–5.1)
SODIUM: 143 mmol/L (ref 135–145)

## 2015-10-13 LAB — HEPARIN LEVEL (UNFRACTIONATED): Heparin Unfractionated: 0.54 [IU]/mL (ref 0.30–0.70)

## 2015-10-13 MED ORDER — LEVOFLOXACIN IN D5W 500 MG/100ML IV SOLN
500.0000 mg | INTRAVENOUS | Status: AC
  Administered 2015-10-14: 500 mg via INTRAVENOUS
  Filled 2015-10-13: qty 100

## 2015-10-13 NOTE — Progress Notes (Signed)
Pt scheduled for tomorrow/Saturday, 10:30 AM for cystoscopy, possible TURP/TURBT/cystolithopaxy. I was able to move it up from Monday to expedite discharge.

## 2015-10-13 NOTE — Care Management Note (Signed)
Case Management Note  Patient Details  Name: Roy [name] MRN: [id number] Date of Birth: Jan 13, 1936  Subjective/Objective:  Noted home rw ordered.[facility] dme rep Jermaine aware. No d/c today.Tomorrow for cystoscopy.                  Action/Plan:d/c plan home w/HHC/DME   Expected Discharge Date:                  Expected Discharge Plan:  [place]  In-House Referral:     Discharge planning Services  CM Consult  Post Acute Care Choice:    Choice offered to:  Patient  DME Arranged:  [place] DME Agency:  [place]:  PT [facility] Agency:  [place]  Status of Service:  In process, will continue to follow  If discussed at Long Length of Stay Meetings, dates discussed:    Additional Comments:  Dessa Phi, RN 10/13/2015, 4:02 PM

## 2015-10-13 NOTE — Progress Notes (Signed)
ANTICOAGULATION CONSULT NOTE - Follow up [place] for Heparin Indication: Hx DVT (home Xarelto on hold)  No Known Allergies  Patient Measurements: Height: 5\' 10"  (177.8 cm) Weight: 226 lb 10.1 oz (102.8 kg) IBW/kg (Calculated) : 73 Heparin Dosing Weight: 96.3kg  Vital Signs: Temp: 97.8 F (36.6 C) (07/21 0444) Temp Source: Oral (07/21 0444) BP: 150/62 mmHg (07/21 0444) Pulse Rate: 82 (07/21 0444)  Labs:  Recent Labs  10/11/15 0531 10/12/15 0544 10/13/15 0458  HGB 11.1* 10.9* 10.5*  HCT 33.6* 33.5* 32.0*  PLT 256 273 292  HEPARINUNFRC 0.49 0.47 0.54  CREATININE 2.29*  --  2.12*    Estimated Creatinine Clearance: 34.5 mL/min (by C-G formula based on Cr of 2.12).   Medical History: Past Medical History  Diagnosis Date  . Diabetes mellitus   . Hypertension   . OSA on CPAP   . GERD (gastroesophageal reflux disease)   . History of prostate cancer 2010-- S/P  EXTERNAL RADIATION  . Hematuria   . Bladder calculi   . Hydronephrosis, left   . Dyspnea on exertion   . Arthritis HANDS/ THUMBS/ SHOULDERS  . Chronic obstructive asthma, unspecified FOLLOWED BY DR Tarri Fuller YOUNG  VISIT 10-14-2011 IN [facility]  . COPD (chronic obstructive pulmonary disease) (HCC)     Medications:  PTA xarelto starter pack prescribed 08/15/15 however pt did not pick up prescription from pharmacy due to insurance issues, told EDP he was receiving xarelto samples from PCP.  Verified with patient on 7/15 -- he completed xarelto dose pack and taking 20 mg daily PTA.  LD 7/12 @ 9AM.   Assessment: 59 yoM with PMHx prostate cancer, DM, HTN, OSA on CPAP, asthma, and COPD with recent dx acute DVT in May 2017 taking xarelto samples from PCP now presents for evaluation after multiple falls. Pharmacy consulted to start IV heparin as pt has AKI.   Today, 10/13/2015: - Heparin level is therapeutic at 0.54 on 1450 units/hr - Hgb remains low and slightly decreased to 10.9, Plts WNL. - Known  hematuria in foley catheter (currently improving per RN this AM, but was bloody yesterday afternoon).Per MD, considering source investigation and control d/t need for chronic anticoagulation.  Urology consulted.  Hematuria is likely multifactorial in origin, due to catheter placement while heparinized in combination with known history of radiation cystitis. - Scr 2.12 is elevated but improving, CrCl 34 ml/min  Goal of Therapy:  Heparin level 0.3-0.7 units/ml  Monitor platelets by anticoagulation protocol: Yes   Plan:  - Continue heparin 1450 units/hr - Daily heparin level and CBC - Follow up long-term anticoagulation plan (If planning for Eliquis dosing, recommend 5mg  PO BID for DVT treatment) - Monitor hematuria symptoms    Gretta Arab PharmD, BCPS Pager [phone] 10/13/2015 6:45 AM

## 2015-10-13 NOTE — Progress Notes (Signed)
Physical Therapy Treatment Patient Details Name: Roy [name] MRN: [id number] DOB: 1935/12/01 Today's Date: 10/13/2015    History of Present Illness 80 y.o. male with medical history significant of DM, HTN, OSA, recent DVT in leg in May on Xarelto, remote history of prostate cancer in 2006 with radiation, prolonged remission since then, admitted on 7/14 with weakness/falls and tiredness, found by family on the floor. No syncope/falls. Found to be septic with A fib with RVR.     PT Comments    Assisted out of recliner to amb in hallway with increased time and need for walker.   Follow Up Recommendations  Home health PT;Supervision - Intermittent     Equipment Recommendations  Rolling walker with 5" wheels    Recommendations for Other Services       Precautions / Restrictions Precautions Precautions: Fall Precaution Comments: afib   Restrictions Weight Bearing Restrictions: No    Mobility  Bed Mobility               General bed mobility comments: OOB in recliner  Transfers Overall transfer level: Needs assistance Equipment used: Rolling walker (2 wheeled)   Sit to Stand: Min guard         General transfer comment: increased time with one VC safety with using hands to reach back prior to sit to control decend  Ambulation/Gait Ambulation/Gait assistance: Min guard Ambulation Distance (Feet): 148 Feet Assistive device: Rolling walker (2 wheeled) Gait Pattern/deviations: Step-to pattern;Step-through pattern Gait velocity: decreased   General Gait Details: slow but steady gait.  Def need to use walker for balance/stability.   Stairs            Wheelchair Mobility    Modified Rankin (Stroke Patients Only)       Balance                                    Cognition Arousal/Alertness: Awake/alert Behavior During Therapy: WFL for tasks assessed/performed Overall Cognitive Status: Within Functional Limits for tasks assessed                       Exercises      General Comments        Pertinent Vitals/Pain Pain Assessment: No/denies pain    Home Living                      Prior Function            PT Goals (current goals can now be found in the care plan section) Progress towards PT goals: Progressing toward goals    Frequency  Min 3X/week    PT Plan Current plan remains appropriate    Co-evaluation             End of Session Equipment Utilized During Treatment: Gait belt Activity Tolerance: Patient tolerated treatment well Patient left: in chair;with call bell/phone within reach;with chair alarm set     Time: [id number] PT Time Calculation (min) (ACUTE ONLY): 21 min  Charges:  $Gait Training: 8-22 mins                    G Codes:      Rica Koyanagi  PTA [facility]  Acute  Rehab Pager      [phone]

## 2015-10-13 NOTE — Progress Notes (Signed)
PROGRESS NOTE  Roy [name] [id number] DOB: 10-09-1935 DOA: 10/06/2015 PCP: Rogers Blocker, MD   LOS: 7 days   Brief Narrative: 80 y.o. male with medical history significant of DM, HTN, OSA, recent DVT in leg in May on Xarelto, remote history of prostate cancer in 2006 with radiation, prolonged remission since then, admitted on 7/14 with weakness/falls and tiredness, found by family on the floor. Found to be septic.  Assessment & Plan: Principal Problem:   Sepsis secondary to UTI [facility]) Active Problems:   DM2 (diabetes mellitus, type 2) ([place])   Obstructive sleep apnea   Essential hypertension   CKD (chronic kidney disease) stage 3, GFR 30-59 ml/min   AKI (acute kidney injury) ([place])   UTI (lower urinary tract infection)   Bacteremia due to Enterococcus  Sepsis secondary to UTI / enterococcus bacteremia - Resolved significantly. For Cystoscopy in am. Updated patient and family. Heparin to be held in am. - sepsis physiology resolved - Blood cultures speciated enterococcus which is sensitive to ampicillin, 1/2 bottles. Not a contaminant - now on ampicillin. ID following. TEE without vegetations found - Urine cultures on admission ordered however unfortunately not obtained since they were automatically canceled by lab - per [name], since TEE negative, will need total of 14 days of antibiotics, minimum 7 of IV - As per ID note 7/19: Continue ampicillin and aim for a total 2 week course. If he is improved enough to go home prior to end of this, could consider amoxicillin to complete course. He also had coagulase-negative staph in blood culture. This did not appear in his repeat culture, and not in his follow-up culture (he received 3 days of vancomycin). Suspect this was a contaminant. Repeat blood cultures negative to date. ID signed off 7/19.  A fib with RVR / PAF - 2D echo obtained and showed ejection fraction 60-65% without wall motion abnormalities. This is likely related to  sepsis. - patient's CHA2DS2-VASc Score for Stroke Risk is 4, currently on heparin infusion. We'll plan to switch soon to Eliquis instead of Xarelto due to chronic kidney disease (awaiting urology workup) - continue po diltiazem able to be weaned off of his diltiazem infusion 7/17 - Telemetry with controlled ventricular rate.  Acute on chronic urinary retention - renal US with moderate right-sided pelvicaliectasis, unchanged from 2013. His case was discussed with [name] from urology. It appears the patient has somewhat of a chronic retention issue, and he recommended placement of a Foley catheter and repeating the ultrasound of his kidneys 24 hours later to reevaluate whether this will improve the hydronephrosis.  - foley placed 7/17, repeat US done today 7/19---> shows resolving hydronephrosis - Urology consultation for hematuria appreciated. His hematuria is likely multifactorial in origin due to catheter placement while heparinized in combination with known history of radiation cystitis. He is already scheduled to be evaluated further with cystoscopy in the urology clinic Tuesday, August 1 at 3:45 PM. Neurology recommends continuing Foley for now as he had hydronephrosis and distended bladder before it was placed. - Due to hematuria, source needs to be determined given that patient is going to be on anticoagulation for DVT and A. fib, discussed with urology regarding possibility of performing cystoscopy while on IV heparin infusion which can be briefly held. Discussed with [name], ID who indicated that it is okay to proceed with cystoscopy since patient has received approximately 7 days of IV antibiotics. Updated [name], Urology  AKI on CKD 3 - Baseline creatinine  may be in the 1.9 range (May 2017). Presented with creatinine of 2.7. Has gradually improved to 2.29. Follow BMP in a.m.  DVT - recently diagnosed in May 2017 and on Xarelto since - given renal failure continue heparin  infusion. We'll switch to Eliquis once assure no hematuria after foley placement - there was concern in the ER regarding his breathing status and underwent a VQ scan which was low probability for PE.   DM - SSI  Hyperkalemia - resolved  OSA - continue CPAP   HTN - controlled.  Anemia -stable.  DVT prophylaxis: heparin gtt (for Afib) Code Status: Full Family Communication: No family bedside this morning. Disposition Plan: Home 2-3 days  Consultants:   Infectious disease  Urology  Procedures:   2-D echo - Left ventricle: The cavity size was normal. There was mild focal basal and moderate concentric hypertrophy of the septum. Systolic function was normal. The estimated ejection fraction was in the range of 60% to 65%. Wall motion was normal; there were no regional wall motion abnormalities. Mitral valve: Moderately calcified annulus. Left atrium: The atrium was moderately dilated.  Antimicrobials:  Ceftriaxone 7/14 >> 7/17   Vancomycin 7/15 >> 7/17  Ampicillin 7/17 >>  Subjective: Denied complaints. No dyspnea or chest pain. As per RN, hematuria in bag has almost resolved.  Objective: Filed Vitals:   10/12/15 2135 10/12/15 2154 10/13/15 0444 10/13/15 1500  BP:  154/88 150/62 159/69  Pulse: 76 90 82 78  Temp:  98.5 F (36.9 C) 97.8 F (36.6 C) 97.9 F (36.6 C)  TempSrc:  Oral Oral Oral  Resp: 16 16 18 18   Height:      Weight:      SpO2: 98% 96% 99% 100%    Intake/Output Summary (Last 24 hours) at 10/13/15 1553 Last data filed at 10/13/15 1039  Gross per 24 hour  Intake    512 ml  Output   1850 ml  Net  -1338 ml   Filed Weights   10/06/15 1811 10/07/15 0400  Weight: 107.956 kg (238 lb) 102.8 kg (226 lb 10.1 oz)    Gen: Pleasant elderly male lying comfortably supine in bed. Neck: normal, supple, no masses, no thyromegaly Respiratory: clear to auscultation bilaterally, no wheezing, no crackles. Normal respiratory effort. No accessory muscle use.    Cardiovascular: irregular, no murmurs / rubs / gallops. Trace bilateral leg edema. Telemetry: A. fib with controlled ventricular rate.. Abdomen: no tenderness. Bowel sounds positive. Foley catheter with pinkish discoloration of urine. Neurologic: non focal . Alert and oriented.   Data Reviewed: I have personally reviewed following labs and imaging studies  CBC:  Recent Labs Lab 10/09/15 0324 10/10/15 0509 10/11/15 0531 10/12/15 0544 10/13/15 0458  WBC 11.1* 11.9* 9.5 8.8 8.4  HGB 10.9* 11.4* 11.1* 10.9* 10.5*  HCT 32.4* 34.1* 33.6* 33.5* 32.0*  MCV 90.5 91.4 91.6 93.1 93.6  PLT 184 212 256 273 [id number]   Basic Metabolic Panel:  Recent Labs Lab 10/08/15 0329 10/09/15 0324 10/10/15 0509 10/11/15 0531 10/13/15 0458  NA 136 141 141 141 143  K 3.6 3.6 4.1 3.8 3.7  CL 109 112* 115* 114* 115*  CO2 20* 21* 21* 22 24  GLUCOSE 85 95 128* 116* 114*  BUN 56* 58* 52* 45* 33*  CREATININE 2.76* 2.69* 2.47* 2.29* 2.12*  CALCIUM 9.9 10.3 10.4* 10.6* 10.8*   GFR: Estimated Creatinine Clearance: 34.5 mL/min (by C-G formula based on Cr of 2.12). Liver Function Tests: No results for input(s): AST,  ALT, ALKPHOS, BILITOT, PROT, ALBUMIN in the last 168 hours. No results for input(s): LIPASE, AMYLASE in the last 168 hours. No results for input(s): AMMONIA in the last 168 hours. Coagulation Profile: No results for input(s): INR, PROTIME in the last 168 hours. Cardiac Enzymes: No results for input(s): CKTOTAL, CKMB, CKMBINDEX, TROPONINI in the last 168 hours. BNP (last 3 results) No results for input(s): PROBNP in the last 8760 hours. HbA1C: No results for input(s): HGBA1C in the last 72 hours. CBG:  Recent Labs Lab 10/12/15 1130 10/12/15 1729 10/12/15 2156 10/13/15 0806 10/13/15 1158  GLUCAP 98 115* 119* 92 133*   Lipid Profile: No results for input(s): CHOL, HDL, LDLCALC, TRIG, CHOLHDL, LDLDIRECT in the last 72 hours. Thyroid Function Tests: No results for input(s): TSH,  T4TOTAL, FREET4, T3FREE, THYROIDAB in the last 72 hours. Anemia Panel: No results for input(s): VITAMINB12, FOLATE, FERRITIN, TIBC, IRON, RETICCTPCT in the last 72 hours. Urine analysis:    Component Value Date/Time   COLORURINE AMBER* 10/06/2015 1811   APPEARANCEUR TURBID* 10/06/2015 1811   LABSPEC 1.018 10/06/2015 1811   PHURINE 5.5 10/06/2015 1811   GLUCOSEU NEGATIVE 10/06/2015 1811   HGBUR LARGE* 10/06/2015 1811   BILIRUBINUR NEGATIVE 10/06/2015 1811   KETONESUR NEGATIVE 10/06/2015 1811   PROTEINUR >300* 10/06/2015 1811   UROBILINOGEN 0.2 09/26/2012 1222   NITRITE POSITIVE* 10/06/2015 1811   LEUKOCYTESUR LARGE* 10/06/2015 1811   Sepsis Labs: Invalid input(s): PROCALCITONIN, LACTICIDVEN  Recent Results (from the past 240 hour(s))  Blood culture (routine x 2)     Status: Abnormal   Collection Time: 10/06/15  3:15 PM  Result Value Ref Range Status   Specimen Description BLOOD RIGHT ANTECUBITAL  Final   Special Requests BOTTLES DRAWN AEROBIC AND ANAEROBIC 5CC  Final   Culture  Setup Time   Final    GRAM POSITIVE COCCI IN CHAINS ANAEROBIC BOTTLE ONLY CRITICAL RESULT CALLED TO, READ BACK BY AND VERIFIED WITH: K. HASKINS RN AT [id number] 10/07/15 BY D. VANHOOK    Culture (A)  Final    ENTEROCOCCUS SPECIES STAPHYLOCOCCUS SPECIES (COAGULASE NEGATIVE) THE SIGNIFICANCE OF ISOLATING THIS ORGANISM FROM A SINGLE SET OF BLOOD CULTURES WHEN MULTIPLE SETS ARE DRAWN IS UNCERTAIN. PLEASE NOTIFY THE MICROBIOLOGY DEPARTMENT WITHIN ONE WEEK IF SPECIATION AND SENSITIVITIES ARE REQUIRED. Performed at [facility]    Report Status 10/10/2015 FINAL  Final   Organism ID, Bacteria ENTEROCOCCUS SPECIES  Final      Susceptibility   Enterococcus species - MIC*    AMPICILLIN <=2 SENSITIVE Sensitive     VANCOMYCIN 1 SENSITIVE Sensitive     GENTAMICIN SYNERGY SENSITIVE Sensitive     * ENTEROCOCCUS SPECIES  Blood culture (routine x 2)     Status: None   Collection Time: 10/06/15  4:33 PM  Result  Value Ref Range Status   Specimen Description BLOOD LEFT HAND  Final   Special Requests IN PEDIATRIC BOTTLE 2.5CC  Final   Culture   Final    NO GROWTH 5 DAYS Performed at [facility]    Report Status 10/11/2015 FINAL  Final  MRSA PCR Screening     Status: None   Collection Time: 10/06/15 10:34 PM  Result Value Ref Range Status   MRSA by PCR NEGATIVE NEGATIVE Final    Comment:        The GeneXpert MRSA Assay (FDA approved for NASAL specimens only), is one component of a comprehensive MRSA colonization surveillance program. It is not intended to diagnose MRSA infection  nor to guide or monitor treatment for MRSA infections.   Rapid strep screen (not at [facility])     Status: None   Collection Time: 10/07/15  9:45 AM  Result Value Ref Range Status   Streptococcus, Group A Screen (Direct) NEGATIVE NEGATIVE Final    Comment: (NOTE) A Rapid Antigen test may result negative if the antigen level in the sample is below the detection level of this test. The FDA has not cleared this test as a stand-alone test therefore the rapid antigen negative result has reflexed to a Group A Strep culture.   Culture, group A strep     Status: None   Collection Time: 10/07/15  9:45 AM  Result Value Ref Range Status   Specimen Description THROAT  Final   Special Requests NONE Reflexed from [id number]  Final   Culture   Final    NO GROUP A STREP (S.PYOGENES) ISOLATED Performed at [facility]    Report Status 10/10/2015 FINAL  Final  Culture, blood (Routine X 2) w Reflex to ID Panel     Status: None (Preliminary result)   Collection Time: 10/09/15  3:38 PM  Result Value Ref Range Status   Specimen Description BLOOD LEFT HAND  Final   Special Requests BOTTLES DRAWN AEROBIC AND ANAEROBIC 5CC  Final   Culture   Final    NO GROWTH 4 DAYS Performed at [facility]    Report Status PENDING  Incomplete    Radiology Studies: No results found. Scheduled Meds: . amitriptyline  10 mg  Oral QHS  . ampicillin (OMNIPEN) IV  2 g Intravenous Q6H  . calcium carbonate  1,250 mg Oral Q breakfast  . diltiazem  60 mg Oral Q6H  . gabapentin  100 mg Oral Daily  . insulin aspart  0-9 Units Subcutaneous TID WC  . [START ON 10/14/2015] levofloxacin (LEVAQUIN) IV  500 mg Intravenous 60 min Pre-Op  . QUEtiapine  25 mg Oral QHS  . tamsulosin  0.4 mg Oral Daily   Continuous Infusions: . sodium chloride 10 mL/hr at 10/11/15 0915  . heparin 1,450 Units/hr (10/13/15 1236)     Bonnell Public, MD, FACP, [facility]. [facility] Pager [phone].  If 7PM-7AM, please contact night-coverage [email or web address] Password [facility] 10/13/2015, 3:53 PM

## 2015-10-13 NOTE — Progress Notes (Signed)
Pt doing well. Urine clear.    Filed Vitals:   10/12/15 2154 10/13/15 0444  BP: 154/88 150/62  Pulse: 90 82  Temp: 98.5 F (36.9 C) 97.8 F (36.6 C)  Resp: 16 18    NAD Sitting in chair  Urine clear   CBC    Component Value Date/Time   WBC 8.4 10/13/2015 0458   WBC 7.1 02/25/2012   RBC 3.42* 10/13/2015 0458   HGB 10.5* 10/13/2015 0458   HCT 32.0* 10/13/2015 0458   PLT 292 10/13/2015 0458   MCV 93.6 10/13/2015 0458   MCH 30.7 10/13/2015 0458   MCHC 32.8 10/13/2015 0458   RDW 14.9 10/13/2015 0458   LYMPHSABS 0.6* 10/06/2015 1516   MONOABS 0.8 10/06/2015 1516   EOSABS 0.0 10/06/2015 1516   BASOSABS 0.0 10/06/2015 1516    BMET    Component Value Date/Time   NA 143 10/13/2015 0458   K 3.7 10/13/2015 0458   CL 115* 10/13/2015 0458   CO2 24 10/13/2015 0458   GLUCOSE 114* 10/13/2015 0458   BUN 33* 10/13/2015 0458   CREATININE 2.12* 10/13/2015 0458   CALCIUM 10.8* 10/13/2015 0458   GFRNONAA 28* 10/13/2015 0458   GFRAA 33* 10/13/2015 0458      A/P - Was seen in office last month with bladder stone and gross hematuria. Now with recurrence and retention. I discussed with patient nature r/b/a to cysto, RGP's, poss cystolithopaxy, TURP, or TURBT. Discussed risks such as bleeding, infection, stricture and / or incontinence among others. All questions answered and he elects to proceed. Discussed with Dr. Marthenia Rolling. Discussed with OR on several occasions to coordinate case.

## 2015-10-14 ENCOUNTER — Encounter (HOSPITAL_COMMUNITY)
Admission: EM | Disposition: A | Discharge status: Home Health | Payer: Self-pay | Source: Home / Self Care | Attending: Internal Medicine

## 2015-10-14 ENCOUNTER — Inpatient Hospital Stay (HOSPITAL_COMMUNITY): Payer: Medicare Other | Admitting: Anesthesiology

## 2015-10-14 ENCOUNTER — Encounter (HOSPITAL_COMMUNITY): Payer: Self-pay | Admitting: Anesthesiology

## 2015-10-14 DIAGNOSIS — I82412 Acute embolism and thrombosis of left femoral vein: Secondary | ICD-10-CM | POA: Insufficient documentation

## 2015-10-14 DIAGNOSIS — N183 Chronic kidney disease, stage 3 (moderate): Secondary | ICD-10-CM

## 2015-10-14 HISTORY — PX: CYSTOSCOPY W/ RETROGRADES: SHX1426

## 2015-10-14 LAB — GLUCOSE, CAPILLARY
GLUCOSE-CAPILLARY: 130 mg/dL — AB (ref 65–99)
GLUCOSE-CAPILLARY: 135 mg/dL — AB (ref 65–99)
GLUCOSE-CAPILLARY: 185 mg/dL — AB (ref 65–99)
Glucose-Capillary: 93 mg/dL (ref 65–99)

## 2015-10-14 LAB — CULTURE, BLOOD (ROUTINE X 2): Culture: NO GROWTH

## 2015-10-14 LAB — SURGICAL PCR SCREEN
MRSA, PCR: NEGATIVE
Staphylococcus aureus: NEGATIVE

## 2015-10-14 LAB — HEPARIN LEVEL (UNFRACTIONATED): HEPARIN UNFRACTIONATED: 0.62 [IU]/mL (ref 0.30–0.70)

## 2015-10-14 SURGERY — CYSTOSCOPY, WITH RETROGRADE PYELOGRAM
Anesthesia: General | Site: Bladder

## 2015-10-14 MED ORDER — DEXAMETHASONE SODIUM PHOSPHATE 10 MG/ML IJ SOLN
INTRAMUSCULAR | Status: AC
  Filled 2015-10-14: qty 1

## 2015-10-14 MED ORDER — OXYCODONE HCL 5 MG PO TABS: 5.0000 mg | ORAL_TABLET | Freq: Once | ORAL | Status: DC | PRN

## 2015-10-14 MED ORDER — DEXAMETHASONE SODIUM PHOSPHATE 4 MG/ML IJ SOLN
INTRAMUSCULAR | Status: DC | PRN
  Administered 2015-10-14: 4 mg via INTRAVENOUS

## 2015-10-14 MED ORDER — HEPARIN (PORCINE) IN NACL 100-0.45 UNIT/ML-% IJ SOLN
1100.0000 [IU]/h | INTRAMUSCULAR | Status: AC
  Administered 2015-10-15: 1450 [IU]/h via INTRAVENOUS
  Administered 2015-10-15: 1300 [IU]/h via INTRAVENOUS
  Filled 2015-10-14 (×2): qty 250

## 2015-10-14 MED ORDER — FENTANYL CITRATE (PF) 100 MCG/2ML IJ SOLN
INTRAMUSCULAR | Status: AC
  Filled 2015-10-14: qty 2

## 2015-10-14 MED ORDER — SODIUM CHLORIDE 0.9 % IR SOLN
Status: DC | PRN
  Administered 2015-10-14: 1000 mL
  Administered 2015-10-14 (×5): 3000 mL

## 2015-10-14 MED ORDER — FENTANYL CITRATE (PF) 100 MCG/2ML IJ SOLN
INTRAMUSCULAR | Status: DC | PRN
  Administered 2015-10-14 (×2): 25 ug via INTRAVENOUS
  Administered 2015-10-14 (×2): 50 ug via INTRAVENOUS
  Administered 2015-10-14 (×2): 25 ug via INTRAVENOUS

## 2015-10-14 MED ORDER — ONDANSETRON HCL 4 MG/2ML IJ SOLN: 4.0000 mg | Freq: Once | INTRAMUSCULAR | Status: DC | PRN

## 2015-10-14 MED ORDER — ONDANSETRON HCL 4 MG/2ML IJ SOLN
INTRAMUSCULAR | Status: DC | PRN
  Administered 2015-10-14: 4 mg via INTRAVENOUS

## 2015-10-14 MED ORDER — MIDAZOLAM HCL 2 MG/2ML IJ SOLN
INTRAMUSCULAR | Status: AC
  Filled 2015-10-14: qty 2

## 2015-10-14 MED ORDER — LIDOCAINE HCL (CARDIAC) 20 MG/ML IV SOLN
INTRAVENOUS | Status: DC | PRN
  Administered 2015-10-14: 100 mg via INTRAVENOUS

## 2015-10-14 MED ORDER — OXYCODONE HCL 5 MG/5ML PO SOLN
5.0000 mg | Freq: Once | ORAL | Status: DC | PRN
  Filled 2015-10-14: qty 5

## 2015-10-14 MED ORDER — PROPOFOL 10 MG/ML IV BOLUS
INTRAVENOUS | Status: DC | PRN
  Administered 2015-10-14: 50 mg via INTRAVENOUS
  Administered 2015-10-14: 150 mg via INTRAVENOUS

## 2015-10-14 MED ORDER — SODIUM CHLORIDE 0.9 % IV SOLN
INTRAVENOUS | Status: DC | PRN
  Administered 2015-10-14: 20 mL

## 2015-10-14 MED ORDER — PROPOFOL 10 MG/ML IV BOLUS
INTRAVENOUS | Status: AC
  Filled 2015-10-14: qty 20

## 2015-10-14 MED ORDER — LIDOCAINE HCL (CARDIAC) 20 MG/ML IV SOLN
INTRAVENOUS | Status: AC
  Filled 2015-10-14: qty 5

## 2015-10-14 MED ORDER — FENTANYL CITRATE (PF) 100 MCG/2ML IJ SOLN
25.0000 ug | INTRAMUSCULAR | Status: DC | PRN
  Administered 2015-10-14 (×2): 50 ug via INTRAVENOUS

## 2015-10-14 MED ORDER — ONDANSETRON HCL 4 MG/2ML IJ SOLN
INTRAMUSCULAR | Status: AC
  Filled 2015-10-14: qty 2

## 2015-10-14 SURGICAL SUPPLY — 32 items
BAG URINE DRAINAGE (UROLOGICAL SUPPLIES) ×4 IMPLANT
BAG URO CATCHER STRL LF (MISCELLANEOUS) ×4 IMPLANT
BASKET ZERO TIP NITINOL 2.4FR (BASKET) ×4 IMPLANT
BSKT STON RTRVL ZERO TP 2.4FR (BASKET) ×2
CATH INTERMIT  6FR 70CM (CATHETERS) ×4 IMPLANT
CATH TIEMANN FOLEY 18FR 5CC (CATHETERS) ×4 IMPLANT
CATH URET 5FR 28IN CONE TIP (BALLOONS) ×2
CATH URET 5FR 70CM CONE TIP (BALLOONS) ×2 IMPLANT
CATH URET WHISTLE 6FR (CATHETERS) ×4 IMPLANT
CLOTH BEACON ORANGE TIMEOUT ST (SAFETY) IMPLANT
ELECT REM PT RETURN 9FT ADLT (ELECTROSURGICAL) ×4
ELECTRODE REM PT RTRN 9FT ADLT (ELECTROSURGICAL) ×2 IMPLANT
EVACUATOR MICROVAS BLADDER (UROLOGICAL SUPPLIES) IMPLANT
FIBER LASER FLEXIVA 1000 (UROLOGICAL SUPPLIES) IMPLANT
FIBER LASER FLEXIVA 365 (UROLOGICAL SUPPLIES) IMPLANT
FIBER LASER FLEXIVA 550 (UROLOGICAL SUPPLIES) IMPLANT
FIBER LASER TRAC TIP (UROLOGICAL SUPPLIES) IMPLANT
GLOVE BIO SURGEON STRL SZ7.5 (GLOVE) ×4 IMPLANT
GLOVE BIOGEL M STRL SZ7.5 (GLOVE) ×4 IMPLANT
GOWN STRL REUS W/TWL XL LVL3 (GOWN DISPOSABLE) ×4 IMPLANT
GUIDEWIRE STR DUAL SENSOR (WIRE) ×4 IMPLANT
HOLDER FOLEY CATH W/STRAP (MISCELLANEOUS) ×4 IMPLANT
LOOP CUT BIPOLAR 24F LRG (ELECTROSURGICAL) ×8 IMPLANT
MANIFOLD NEPTUNE II (INSTRUMENTS) ×4 IMPLANT
PACK CYSTO (CUSTOM PROCEDURE TRAY) ×4 IMPLANT
SHEATH ACCESS URETERAL 24CM (SHEATH) IMPLANT
SHEATH ACCESS URETERAL 38CM (SHEATH) IMPLANT
SYR 30ML LL (SYRINGE) ×4 IMPLANT
SYRINGE IRR TOOMEY STRL 70CC (SYRINGE) ×4 IMPLANT
TUBING CONNECTING 10 (TUBING) ×3 IMPLANT
TUBING CONNECTING 10' (TUBING) ×1
WIRE COONS/BENSON .038X145CM (WIRE) ×4 IMPLANT

## 2015-10-14 NOTE — Progress Notes (Signed)
Pt without complaint.   Filed Vitals:   10/13/15 2102 10/14/15 0554  BP: 139/103 150/59  Pulse: 83 75  Temp: 98.9 F (37.2 C) 98.5 F (36.9 C)  Resp:  18    Urine clear  A/P - plan cysto, RGP's and any treatments necessary. Pt stable for procedure.

## 2015-10-14 NOTE — Progress Notes (Signed)
PROGRESS NOTE  Roy [name] [id number] DOB: 07-12-1935 DOA: 10/06/2015 PCP: Rogers Blocker, MD   LOS: 8 days   Brief Narrative: 80 y.o. male with medical history significant of DM, HTN, OSA, recent DVT in leg in May on Xarelto, remote history of prostate cancer in 2006 with radiation, prolonged remission since then, admitted on 7/14 with weakness/falls and tiredness, found by family on the floor. Found to be septic.  Assessment & Plan: Principal Problem:   Sepsis secondary to UTI [facility]) Active Problems:   DM2 (diabetes mellitus, type 2) ([place])   Obstructive sleep apnea   Essential hypertension   CKD (chronic kidney disease) stage 3, GFR 30-59 ml/min   AKI (acute kidney injury) ([place])   UTI (lower urinary tract infection)   Bacteremia due to Enterococcus  Sepsis secondary to UTI / enterococcus bacteremia - Resolved significantly. Cystoscopy findings noted. Discussed with [name] - Heparin drip will be restarted in am. SCD for DVT prophylaxis. Updated patient and family.   - sepsis physiology resolved - Blood cultures speciated enterococcus which is sensitive to ampicillin, 1/2 bottles. Not a contaminant - now on ampicillin. ID following. TEE without vegetations found - Urine cultures on admission ordered however unfortunately not obtained since they were automatically canceled by lab - per [name], since TEE negative, will need total of 14 days of antibiotics, minimum 7 of IV - As per ID note 7/19: Continue ampicillin and aim for a total 2 week course. If he is improved enough to go home prior to end of this, could consider amoxicillin to complete course. He also had coagulase-negative staph in blood culture. This did not appear in his repeat culture, and not in his follow-up culture (he received 3 days of vancomycin). Suspect this was a contaminant. Repeat blood cultures negative to date. ID signed off 7/19.  A fib with RVR / PAF - Rate controlled. Restart Heparin drip in  am. - 2D echo obtained and showed ejection fraction 60-65% without wall motion abnormalities. This is likely related to sepsis. - patient's CHA2DS2-VASc Score for Stroke Risk is 4, currently on heparin infusion. We'll plan to switch soon to Eliquis instead of Xarelto due to chronic kidney disease (awaiting urology workup) - continue po diltiazem able to be weaned off of his diltiazem infusion 7/17 - Telemetry with controlled ventricular rate.  Acute on chronic urinary retention - Urology input is highly appreciated. - renal US with moderate right-sided pelvicaliectasis, unchanged from 2013. His case was discussed with [name] from urology. It appears the patient has somewhat of a chronic retention issue, and he recommended placement of a Foley catheter and repeating the ultrasound of his kidneys 24 hours later to reevaluate whether this will improve the hydronephrosis.  - foley placed 7/17, repeat US done today 7/19---> shows resolving hydronephrosis - Urology consultation for hematuria appreciated. His hematuria is likely multifactorial in origin due to catheter placement while heparinized in combination with known history of radiation cystitis. He is already scheduled to be evaluated further with cystoscopy in the urology clinic Tuesday, August 1 at 3:45 PM. Neurology recommends continuing Foley for now as he had hydronephrosis and distended bladder before it was placed. - Due to hematuria, source needs to be determined given that patient is going to be on anticoagulation for DVT and A. fib, discussed with urology regarding possibility of performing cystoscopy while on IV heparin infusion which can be briefly held. Discussed with [name], ID who indicated that it is okay to  proceed with cystoscopy since patient has received approximately 7 days of IV antibiotics. Updated [name], Urology  AKI on CKD 3 - AKI is slowly resolving. Repeat BMP in am. - Baseline creatinine may be in the 1.9  range (May 2017). Presented with creatinine of 2.7. Has gradually improved to 2.29. Follow BMP in a.m.  DVT - Restart Heparin drip in am, and then change back to Xarelto after bleeding risk is more tolerable. - recently diagnosed in May 2017 and on Xarelto since - given renal failure continue heparin infusion. We'll switch to Eliquis once assure no hematuria after foley placement - there was concern in the ER regarding his breathing status and underwent a VQ scan which was low probability for PE.   DM - SSI  Hyperkalemia - resolved  OSA - continue CPAP   HTN - controlled.  Anemia -stable.  DVT prophylaxis: heparin gtt (for Afib) Code Status: Full Family Communication: No family bedside this morning. Disposition Plan: Home 2-3 days  Consultants:   Infectious disease  Urology  Procedures:   2-D echo - Left ventricle: The cavity size was normal. There was mild focal basal and moderate concentric hypertrophy of the septum. Systolic function was normal. The estimated ejection fraction was in the range of 60% to 65%. Wall motion was normal; there were no regional wall motion abnormalities. Mitral valve: Moderately calcified annulus. Left atrium: The atrium was moderately dilated.  Antimicrobials:  Ceftriaxone 7/14 >> 7/17   Vancomycin 7/15 >> 7/17  Ampicillin 7/17 >>  Subjective: Denied complaints. No dyspnea or chest pain. As per RN, hematuria in bag has almost resolved.  Objective: Filed Vitals:   10/14/15 1245 10/14/15 1247 10/14/15 1300 10/14/15 1320  BP: 154/67  151/69 177/90  Pulse: 77  76 81  Temp: 97.8 F (36.6 C)  97.6 F (36.4 C) 97.7 F (36.5 C)  TempSrc:      Resp: 14  14 16   Height:      Weight:      SpO2: 100% 100% 97% 100%    Intake/Output Summary (Last 24 hours) at 10/14/15 1645 Last data filed at 10/14/15 1300  Gross per 24 hour  Intake    900 ml  Output   1780 ml  Net   -880 ml   Filed Weights   10/06/15 1811 10/07/15 0400  Weight:  107.956 kg (238 lb) 102.8 kg (226 lb 10.1 oz)    Gen: Pleasant elderly male lying comfortably supine in bed. Neck: normal, supple, no masses, no thyromegaly Respiratory: clear to auscultation bilaterally, no wheezing, no crackles. Normal respiratory effort. No accessory muscle use.  Cardiovascular: irregular, no murmurs / rubs / gallops. Trace bilateral leg edema. Telemetry: A. fib with controlled ventricular rate.. Abdomen: no tenderness. Bowel sounds positive. Foley catheter with pinkish discoloration of urine. Neurologic: non focal . Alert and oriented.   Data Reviewed: I have personally reviewed following labs and imaging studies  CBC:  Recent Labs Lab 10/09/15 0324 10/10/15 0509 10/11/15 0531 10/12/15 0544 10/13/15 0458  WBC 11.1* 11.9* 9.5 8.8 8.4  HGB 10.9* 11.4* 11.1* 10.9* 10.5*  HCT 32.4* 34.1* 33.6* 33.5* 32.0*  MCV 90.5 91.4 91.6 93.1 93.6  PLT 184 212 256 273 [id number]   Basic Metabolic Panel:  Recent Labs Lab 10/08/15 0329 10/09/15 0324 10/10/15 0509 10/11/15 0531 10/13/15 0458  NA 136 141 141 141 143  K 3.6 3.6 4.1 3.8 3.7  CL 109 112* 115* 114* 115*  CO2 20* 21* 21* 22 24  GLUCOSE 85 95 128* 116* 114*  BUN 56* 58* 52* 45* 33*  CREATININE 2.76* 2.69* 2.47* 2.29* 2.12*  CALCIUM 9.9 10.3 10.4* 10.6* 10.8*   GFR: Estimated Creatinine Clearance: 34.5 mL/min (by C-G formula based on Cr of 2.12). Liver Function Tests: No results for input(s): AST, ALT, ALKPHOS, BILITOT, PROT, ALBUMIN in the last 168 hours. No results for input(s): LIPASE, AMYLASE in the last 168 hours. No results for input(s): AMMONIA in the last 168 hours. Coagulation Profile: No results for input(s): INR, PROTIME in the last 168 hours. Cardiac Enzymes: No results for input(s): CKTOTAL, CKMB, CKMBINDEX, TROPONINI in the last 168 hours. BNP (last 3 results) No results for input(s): PROBNP in the last 8760 hours. HbA1C: No results for input(s): HGBA1C in the last 72 hours. CBG:  Recent  Labs Lab 10/13/15 1158 10/13/15 1719 10/13/15 2114 10/14/15 0747 10/14/15 1256  GLUCAP 133* 100* 126* 93 130*   Lipid Profile: No results for input(s): CHOL, HDL, LDLCALC, TRIG, CHOLHDL, LDLDIRECT in the last 72 hours. Thyroid Function Tests: No results for input(s): TSH, T4TOTAL, FREET4, T3FREE, THYROIDAB in the last 72 hours. Anemia Panel: No results for input(s): VITAMINB12, FOLATE, FERRITIN, TIBC, IRON, RETICCTPCT in the last 72 hours. Urine analysis:    Component Value Date/Time   COLORURINE AMBER* 10/06/2015 1811   APPEARANCEUR TURBID* 10/06/2015 1811   LABSPEC 1.018 10/06/2015 1811   PHURINE 5.5 10/06/2015 1811   GLUCOSEU NEGATIVE 10/06/2015 1811   HGBUR LARGE* 10/06/2015 1811   BILIRUBINUR NEGATIVE 10/06/2015 1811   KETONESUR NEGATIVE 10/06/2015 1811   PROTEINUR >300* 10/06/2015 1811   UROBILINOGEN 0.2 09/26/2012 1222   NITRITE POSITIVE* 10/06/2015 1811   LEUKOCYTESUR LARGE* 10/06/2015 1811   Sepsis Labs: Invalid input(s): PROCALCITONIN, LACTICIDVEN  Recent Results (from the past 240 hour(s))  Blood culture (routine x 2)     Status: Abnormal   Collection Time: 10/06/15  3:15 PM  Result Value Ref Range Status   Specimen Description BLOOD RIGHT ANTECUBITAL  Final   Special Requests BOTTLES DRAWN AEROBIC AND ANAEROBIC 5CC  Final   Culture  Setup Time   Final    GRAM POSITIVE COCCI IN CHAINS ANAEROBIC BOTTLE ONLY CRITICAL RESULT CALLED TO, READ BACK BY AND VERIFIED WITH: K. HASKINS RN AT [id number] 10/07/15 BY D. VANHOOK    Culture (A)  Final    ENTEROCOCCUS SPECIES STAPHYLOCOCCUS SPECIES (COAGULASE NEGATIVE) THE SIGNIFICANCE OF ISOLATING THIS ORGANISM FROM A SINGLE SET OF BLOOD CULTURES WHEN MULTIPLE SETS ARE DRAWN IS UNCERTAIN. PLEASE NOTIFY THE MICROBIOLOGY DEPARTMENT WITHIN ONE WEEK IF SPECIATION AND SENSITIVITIES ARE REQUIRED. Performed at [facility]    Report Status 10/10/2015 FINAL  Final   Organism ID, Bacteria ENTEROCOCCUS SPECIES  Final       Susceptibility   Enterococcus species - MIC*    AMPICILLIN <=2 SENSITIVE Sensitive     VANCOMYCIN 1 SENSITIVE Sensitive     GENTAMICIN SYNERGY SENSITIVE Sensitive     * ENTEROCOCCUS SPECIES  Blood culture (routine x 2)     Status: None   Collection Time: 10/06/15  4:33 PM  Result Value Ref Range Status   Specimen Description BLOOD LEFT HAND  Final   Special Requests IN PEDIATRIC BOTTLE 2.5CC  Final   Culture   Final    NO GROWTH 5 DAYS Performed at [facility]    Report Status 10/11/2015 FINAL  Final  MRSA PCR Screening     Status: None   Collection Time: 10/06/15 10:34 PM  Result  Value Ref Range Status   MRSA by PCR NEGATIVE NEGATIVE Final    Comment:        The GeneXpert MRSA Assay (FDA approved for NASAL specimens only), is one component of a comprehensive MRSA colonization surveillance program. It is not intended to diagnose MRSA infection nor to guide or monitor treatment for MRSA infections.   Rapid strep screen (not at [facility])     Status: None   Collection Time: 10/07/15  9:45 AM  Result Value Ref Range Status   Streptococcus, Group A Screen (Direct) NEGATIVE NEGATIVE Final    Comment: (NOTE) A Rapid Antigen test may result negative if the antigen level in the sample is below the detection level of this test. The FDA has not cleared this test as a stand-alone test therefore the rapid antigen negative result has reflexed to a Group A Strep culture.   Culture, group A strep     Status: None   Collection Time: 10/07/15  9:45 AM  Result Value Ref Range Status   Specimen Description THROAT  Final   Special Requests NONE Reflexed from [id number]  Final   Culture   Final    NO GROUP A STREP (S.PYOGENES) ISOLATED Performed at [facility]    Report Status 10/10/2015 FINAL  Final  Culture, blood (Routine X 2) w Reflex to ID Panel     Status: None (Preliminary result)   Collection Time: 10/09/15  3:38 PM  Result Value Ref Range Status   Specimen Description  BLOOD LEFT HAND  Final   Special Requests BOTTLES DRAWN AEROBIC AND ANAEROBIC 5CC  Final   Culture   Final    NO GROWTH 4 DAYS Performed at [facility]    Report Status PENDING  Incomplete  Surgical pcr screen     Status: None   Collection Time: 10/14/15 10:02 AM  Result Value Ref Range Status   MRSA, PCR NEGATIVE NEGATIVE Final   Staphylococcus aureus NEGATIVE NEGATIVE Final    Comment:        The Xpert SA Assay (FDA approved for NASAL specimens in patients over 14 years of age), is one component of a comprehensive surveillance program.  Test performance has been validated by [facility] for patients greater than or equal to 24 year old. It is not intended to diagnose infection nor to guide or monitor treatment.     Radiology Studies: No results found. Scheduled Meds: . amitriptyline  10 mg Oral QHS  . ampicillin (OMNIPEN) IV  2 g Intravenous Q6H  . calcium carbonate  1,250 mg Oral Q breakfast  . diltiazem  60 mg Oral Q6H  . fentaNYL      . gabapentin  100 mg Oral Daily  . insulin aspart  0-9 Units Subcutaneous TID WC  . QUEtiapine  25 mg Oral QHS  . tamsulosin  0.4 mg Oral Daily   Continuous Infusions: . sodium chloride 10 mL/hr at 10/11/15 0915  . [START ON 10/15/2015] heparin       Bonnell Public, MD, FACP, [facility]. [facility] Pager [phone].  If 7PM-7AM, please contact night-coverage [email or web address] Password [facility] 10/14/2015, 4:45 PM

## 2015-10-14 NOTE — Anesthesia Procedure Notes (Signed)
Procedure Name: LMA Insertion Date/Time: 10/14/2015 10:45 AM Performed by: Riki Sheer Pre-anesthesia Checklist: Patient identified, Emergency Drugs available, Suction available, Patient being monitored and Timeout performed Patient Re-evaluated:Patient Re-evaluated prior to inductionOxygen Delivery Method: Circle system utilized Preoxygenation: Pre-oxygenation with 100% oxygen Intubation Type: IV induction LMA: LMA inserted LMA Size: 5.0 Number of attempts: 2 Placement Confirmation: positive ETCO2,  CO2 detector and breath sounds checked- equal and bilateral Tube secured with: Tape Dental Injury: Teeth and Oropharynx as per pre-operative assessment

## 2015-10-14 NOTE — Transfer of Care (Signed)
Immediate Anesthesia Transfer of Care Note  Patient: Roy [name]  Procedure(s) Performed: Procedure(s): CYSTOSCOPY WITH BILATERAL RETROGRADE PYELOGRAM/ FULGERATION WITH LITHOLAPAXY (Bilateral)  Patient Location: PACU  Anesthesia Type:General  Level of Consciousness: awake, alert  and confused  Airway & Oxygen Therapy: Patient Spontanous Breathing and Patient connected to nasal cannula oxygen  Post-op Assessment: Report given to RN and Post -op Vital signs reviewed and stable  Post vital signs: Reviewed and stable  Last Vitals:  Filed Vitals:   10/13/15 2102 10/14/15 0554  BP: 139/103 150/59  Pulse: 83 75  Temp: 37.2 C 36.9 C  Resp:  18    Last Pain:  Filed Vitals:   10/14/15 1128  PainSc: 0-No pain         Complications: No apparent anesthesia complications

## 2015-10-14 NOTE — Progress Notes (Signed)
ANTICOAGULATION CONSULT NOTE - Follow up [facility] for Heparin Indication: Hx DVT (home Xarelto on hold)  No Known Allergies  Patient Measurements: Height: 5\' 10"  (177.8 cm) Weight: 226 lb 10.1 oz (102.8 kg) IBW/kg (Calculated) : 73 Heparin Dosing Weight: 96.3kg  Vital Signs: Temp: 97.7 F (36.5 C) (07/22 1320) Temp Source: Oral (07/22 0554) BP: 177/90 mmHg (07/22 1320) Pulse Rate: 81 (07/22 1320)  Labs:  Recent Labs  10/12/15 0544 10/13/15 0458 10/14/15 0541  HGB 10.9* 10.5*  --   HCT 33.5* 32.0*  --   PLT 273 292  --   HEPARINUNFRC 0.47 0.54 0.62  CREATININE  --  2.12*  --     Estimated Creatinine Clearance: 34.5 mL/min (by C-G formula based on Cr of 2.12).   Medications:  PTA xarelto starter pack prescribed 08/15/15 however pt did not pick up prescription from pharmacy due to insurance issues, told EDP he was receiving xarelto samples from PCP.  Verified with patient on 7/15 -- he completed xarelto dose pack and taking 20 mg daily PTA.  LD 7/12 @ 9AM.   Assessment: 18 yoM with PMHx prostate cancer, DM, HTN, OSA on CPAP, asthma, and COPD with recent dx acute DVT in May 2017 taking xarelto samples from PCP now presents for evaluation after multiple falls. Pharmacy consulted to start IV heparin as pt has AKI.   Today, 10/14/2015: - Heparin was stopped this am and patient underwent cystoscopy with bilateral retrograde pyelogram, cystoscopy litholapaxy of multiple small stones/dystrophic calcification. - Per urology note and discussion with Dr. Marthenia Rolling, hold anticoagulation until tomorrow morning.   Goal of Therapy:  Heparin level 0.3-0.7 units/ml  Monitor platelets by anticoagulation protocol: Yes   Plan:  - At 6am, resume heparin infusion at 1450 units/hr. No bolus. - Check a heparin level at 1200.  - Daily heparin level and CBC - Follow up long-term anticoagulation plan (If planning for Eliquis dosing, recommend 5mg  PO BID for DVT treatment) -  Monitor hematuria symptoms.  Romeo Rabon, PharmD, pager [phone]. 10/14/2015,4:41 PM.

## 2015-10-14 NOTE — Op Note (Signed)
Preoperative diagnosis: Gross hematuria, acute on chronic renal failure Postoperative diagnosis: Gross hematuria, chronic renal failure, bladder stones  Procedure: Cystoscopy with bilateral retrograde pyelogram, cystoscopy litholapaxy of multiple small stones/dystrophic calcification  Surgeon: Junious Silk  Anesthesia: Gen.  Indication for procedure: 80 year old with a history of radiation for prostate cancer, hemorrhagic cystitis and bladder stones. He underwent vaporization of the median lobe and fulguration of the mucosa about 3 years ago. Ureter presented with straining to void, retention and passing a bladder stone and blood clots. His been in hospital on antibiotics and was brought today for cystoscopy. There was about a 3 cm layer of stone in the bladder on ultrasound.  Findings: On cystoscopy the urethra was normal, the prostate was large but nonobstructive, the prior median lobe was well vaporized. The trigone and ureteral orifices were in their normal orthotopic position although there were some surrounding edema of the mucosa. There was clear efflux bilaterally. There was a layer of stones adherent to and grown into in some locations the mucosa all the way up on the trigone of the posterior bladder toward the dome. 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 There were no worrisome findings. When 1 stone broke free the mucosa wouldn't ooze from the "divot" and this required light fulguration.  Left retrograde pyelogram-the ureter was slightly J hook, there was mild dilation of the ureter and collecting system and no filling defect. There was good drainage.  Right retrograde pyelogram-the ureter was slightly J hook, there was mild dilation of the ureter and collecting system a little bit worse than the left but good drainage. No filling defects.  Description of procedure: After consent was obtained patient brought to the operating room. After adequate  anesthesia he was placed in lithotomy position and prepped and draped in the usual sterile fashion. The Foley catheter had been removed. The cystoscope was passed per urethra and the bladder inspected with the 30 and 70 lens. A cone-tipped catheter was used to cannulate the left and the right ureteral orifice and retrograde injection of contrast was performed.  The resectoscope sheet was then passed and using a loop the stones were pushed and scraped out of and off the mucosa. The bleeding divots were lightly fulgurated. All the stone was scraped and mucosa lightly fulgurated. There was no bleeding under low pressure. The bladder was filled and the scope removed. An 33 French coud catheter was placed and left gravity drainage with clear urine.  Complications: None  Blood loss: Minimal  Specimens: None  Drains: 75 French coud catheter  Disposition: Patient stable to PACU.  Plan: Continue tamsulosin.  Will attempt voiding trial in a.m. if urine remains clear.  If possible would wait until Monday to restart heavy anticoagulation given raw mucosal surfaces.

## 2015-10-14 NOTE — Anesthesia Postprocedure Evaluation (Signed)
Anesthesia Post Note  Patient: Roy [name]  Procedure(s) Performed: Procedure(s) (LRB): CYSTOSCOPY WITH BILATERAL RETROGRADE PYELOGRAM/ FULGERATION WITH LITHOLAPAXY (Bilateral)  Patient location during evaluation: PACU Anesthesia Type: General Level of consciousness: awake, awake and alert and oriented Pain management: pain level controlled Vital Signs Assessment: post-procedure vital signs reviewed and stable Respiratory status: spontaneous breathing, nonlabored ventilation and respiratory function stable Cardiovascular status: blood pressure returned to baseline Anesthetic complications: no    Last Vitals:  Filed Vitals:   10/14/15 1245 10/14/15 1300  BP: 154/67 151/69  Pulse: 77 76  Temp: 36.6 C 36.4 C  Resp: 14 14    Last Pain:  Filed Vitals:   10/14/15 1307  PainSc: 0-No pain                 [name] COKER

## 2015-10-14 NOTE — Anesthesia Preprocedure Evaluation (Signed)
Anesthesia Evaluation  Patient identified by MRN, date of birth, ID band Patient awake    Reviewed: Allergy & Precautions, NPO status , Patient's Chart, lab work & pertinent test results  Airway Mallampati: II  TM Distance: >3 FB Neck ROM: Full    Dental  (+) Partial Upper, Partial Lower   Pulmonary former smoker,    breath sounds clear to auscultation       Cardiovascular hypertension,  Rhythm:Regular Rate:Normal     Neuro/Psych    GI/Hepatic   Endo/Other  diabetes  Renal/GU      Musculoskeletal   Abdominal   Peds  Hematology   Anesthesia Other Findings   Reproductive/Obstetrics                             Anesthesia Physical Anesthesia Plan  ASA: III  Anesthesia Plan: General   Post-op Pain Management:    Induction: Intravenous  Airway Management Planned: Oral ETT  Additional Equipment:   Intra-op Plan:   Post-operative Plan: Extubation in OR  Informed Consent: I have reviewed the patients History and Physical, chart, labs and discussed the procedure including the risks, benefits and alternatives for the proposed anesthesia with the patient or authorized representative who has indicated his/her understanding and acceptance.   Dental advisory given  Plan Discussed with: CRNA and Anesthesiologist  Anesthesia Plan Comments:         Anesthesia Quick Evaluation

## 2015-10-15 LAB — GLUCOSE, CAPILLARY
GLUCOSE-CAPILLARY: 139 mg/dL — AB (ref 65–99)
Glucose-Capillary: 148 mg/dL — ABNORMAL HIGH (ref 65–99)
Glucose-Capillary: 184 mg/dL — ABNORMAL HIGH (ref 65–99)
Glucose-Capillary: 134 mg/dL — ABNORMAL HIGH (ref 65–99)

## 2015-10-15 LAB — RENAL FUNCTION PANEL
Albumin: 2.8 g/dL — ABNORMAL LOW (ref 3.5–5.0)
Anion gap: 5 (ref 5–15)
BUN: 26 mg/dL — ABNORMAL HIGH (ref 6–20)
CO2: 22 mmol/L (ref 22–32)
Calcium: 11 mg/dL — ABNORMAL HIGH (ref 8.9–10.3)
Chloride: 114 mmol/L — ABNORMAL HIGH (ref 101–111)
Creatinine, Ser: 1.91 mg/dL — ABNORMAL HIGH (ref 0.61–1.24)
GFR calc Af Amer: 37 mL/min — ABNORMAL LOW (ref >60)
GFR calc non Af Amer: 32 mL/min — ABNORMAL LOW (ref >60)
Glucose, Bld: 152 mg/dL — ABNORMAL HIGH (ref 65–99)
Phosphorus: 4.1 mg/dL (ref 2.5–4.6)
Potassium: 4.6 mmol/L (ref 3.5–5.1)
Sodium: 141 mmol/L (ref 135–145)

## 2015-10-15 LAB — URINALYSIS, ROUTINE W REFLEX MICROSCOPIC
Bilirubin Urine: NEGATIVE
Glucose, UA: NEGATIVE mg/dL
Ketones, ur: NEGATIVE mg/dL
Nitrite: NEGATIVE
Protein, ur: 100 mg/dL — AB
Specific Gravity, Urine: 1.019 (ref 1.005–1.030)
pH: 6 (ref 5.0–8.0)

## 2015-10-15 LAB — CBC
HCT: 33.9 % — ABNORMAL LOW (ref 39.0–52.0)
Hemoglobin: 11.1 g/dL — ABNORMAL LOW (ref 13.0–17.0)
MCH: 30.6 pg (ref 26.0–34.0)
MCHC: 32.7 g/dL (ref 30.0–36.0)
MCV: 93.4 fL (ref 78.0–100.0)
Platelets: 293 10*3/uL (ref 150–400)
RBC: 3.63 MIL/uL — ABNORMAL LOW (ref 4.22–5.81)
RDW: 14.5 % (ref 11.5–15.5)
WBC: 16.7 10*3/uL — ABNORMAL HIGH (ref 4.0–10.5)

## 2015-10-15 LAB — URINE MICROSCOPIC-ADD ON

## 2015-10-15 LAB — HEPARIN LEVEL (UNFRACTIONATED)
Heparin Unfractionated: 0.57 IU/mL (ref 0.30–0.70)
Heparin Unfractionated: 0.69 IU/mL (ref 0.30–0.70)

## 2015-10-15 MED ORDER — HYDRALAZINE HCL 25 MG PO TABS: 25.0000 mg | ORAL_TABLET | Freq: Four times a day (QID) | ORAL | Status: DC | PRN

## 2015-10-15 NOTE — Progress Notes (Signed)
  Pt without complaints. Requests to keep foley overnight.   Vitals:   10/15/15 0458 10/15/15 1030  BP:  (!) 172/80  Pulse: 79   Resp: 16   Temp: 97.7 F (36.5 C)     PE: NAD Urine clear    CBC    Component Value Date/Time   WBC 16.7 (H) 10/15/2015 1207   RBC 3.63 (L) 10/15/2015 1207   HGB 11.1 (L) 10/15/2015 1207   HCT 33.9 (L) 10/15/2015 1207   PLT 293 10/15/2015 1207   MCV 93.4 10/15/2015 1207   MCH 30.6 10/15/2015 1207   MCHC 32.7 10/15/2015 1207   RDW 14.5 10/15/2015 1207   LYMPHSABS 0.6 (L) 10/06/2015 1516   MONOABS 0.8 10/06/2015 1516   EOSABS 0.0 10/06/2015 1516   BASOSABS 0.0 10/06/2015 1516   BMET    Component Value Date/Time   NA 141 10/15/2015 0507   K 4.6 10/15/2015 0507   CL 114 (H) 10/15/2015 0507   CO2 22 10/15/2015 0507   GLUCOSE 152 (H) 10/15/2015 0507   BUN 26 (H) 10/15/2015 0507   CREATININE 1.91 (H) 10/15/2015 0507   CALCIUM 11.0 (H) 10/15/2015 0507   GFRNONAA 32 (L) 10/15/2015 0507   GFRAA 37 (L) 10/15/2015 0507     A/P - gross hematuria, bladder stones - s/p cystolithopaxy, fulguration of bleeding - Urine clear, will d/c foley in AM.

## 2015-10-15 NOTE — Care Management Important Message (Signed)
Important Message  Patient Details  Name: Roy [name] MRN: [id number] Date of Birth: 11-Apr-1935   Medicare Important Message Given:  Yes    Apolonio Schneiders, RN 10/15/2015, 3:19 PM

## 2015-10-15 NOTE — Progress Notes (Signed)
ANTICOAGULATION CONSULT NOTE - Follow up [place] for Heparin Indication: Hx DVT (home Xarelto on hold)  No Known Allergies  Patient Measurements: Height: 5\' 10"  (177.8 cm) Weight: 226 lb 10.1 oz (102.8 kg) IBW/kg (Calculated) : 73 Heparin Dosing Weight: 96.3kg  Vital Signs: Temp: 98.4 F (36.9 C) (07/23 1355) Temp Source: Oral (07/23 1355) BP: 136/72 (07/23 1355) Pulse Rate: 83 (07/23 1355)  Labs:  Recent Labs  10/13/15 0458 10/14/15 0541 10/15/15 0507 10/15/15 1207 10/15/15 1824  HGB 10.5*  --   --  11.1*  --   HCT 32.0*  --   --  33.9*  --   PLT 292  --   --  293  --   HEPARINUNFRC 0.54 0.62  --  0.57 0.69  CREATININE 2.12*  --  1.91*  --   --     Estimated Creatinine Clearance: 38.3 mL/min (by C-G formula based on SCr of 1.91 mg/dL).   Medications:  PTA xarelto starter pack prescribed 08/15/15 however pt did not pick up prescription from pharmacy due to insurance issues, told EDP he was receiving xarelto samples from PCP.  Verified with patient on 7/15 -- he completed xarelto dose pack and taking 20 mg daily PTA.  LD 7/12 @ 9AM.   Assessment: 42 yoM with PMHx prostate cancer, DM, HTN, OSA on CPAP, asthma, and COPD with recent dx acute DVT in May 2017 taking xarelto samples from PCP now presents for evaluation after multiple falls. Pharmacy consulted to start IV heparin as pt has AKI.   7/22: Underwent cystoscopy with bilateral retrograde pyelogram, cystoscopy litholapaxy of multiple small stones/dystrophic calcification. Anticoag to resume 7/23 AM.   Today, 10/15/2015: - Second heparin level is at the upper-end of the target range.  - CBC stable. Confirmed no hematuria with the RN again. - SCr has improved to reported baseline ~1.9.   Goal of Therapy:  Heparin level 0.3-0.7 units/ml  Monitor platelets by anticoagulation protocol: Yes   Plan:  - Reduce heparin infusion to 1300units/hr. - Next heparin level at 5AM (~9 hrs from now).  -  Daily heparin level and CBC - Follow up long-term anticoagulation plan (If planning for Eliquis dosing, recommend 5mg  PO BID for DVT treatment) - Monitor for hematuria.  Romeo Rabon, PharmD, pager [phone]. 10/15/2015,7:39 PM.

## 2015-10-15 NOTE — Progress Notes (Signed)
PROGRESS NOTE  Roy [name] [id number] DOB: 1935/04/29 DOA: 10/06/2015 PCP: Rogers Blocker, MD   LOS: 9 days   Brief Narrative: 80 y.o. male with medical history significant of DM, HTN, OSA, recent DVT in leg in May on Xarelto, remote history of prostate cancer in 2006 with radiation, prolonged remission since then, admitted on 7/14 with weakness/falls and tiredness, found by family on the floor. Found to be septic.  Assessment & Plan: Principal Problem:   Sepsis secondary to UTI [facility]) Active Problems:   DM2 (diabetes mellitus, type 2) ([name])   Obstructive sleep apnea   Essential hypertension   CKD (chronic kidney disease) stage 3, GFR 30-59 ml/min   AKI (acute kidney injury) ([name])   UTI (lower urinary tract infection)   Bacteremia due to Enterococcus   Acute deep vein thrombosis (DVT) of femoral vein of left lower extremity (HCC)  Sepsis secondary to UTI / enterococcus bacteremia - Resolved significantly. Cystoscopy findings noted. Discussed with [name] - Heparin restarted this morning. Urine in foley remains clear. Likely restart oral anticoagulation after a couple of days. Will repeat urinalysis and urine culture (Leukocytosis noted, likely reactive as patient remains stable).   - sepsis physiology resolved - Blood cultures speciated enterococcus which is sensitive to ampicillin, 1/2 bottles. Not a contaminant - now on ampicillin. ID following. TEE without vegetations found - Urine cultures on admission ordered however unfortunately not obtained since they were automatically canceled by lab - per [name], since TEE negative, will need total of 14 days of antibiotics, minimum 7 of IV - As per ID note 7/19: Continue ampicillin and aim for a total 2 week course. If he is improved enough to go home prior to end of this, could consider amoxicillin to complete course. He also had coagulase-negative staph in blood culture. This did not appear in his repeat culture, and not in  his follow-up culture (he received 3 days of vancomycin). Suspect this was a contaminant. Repeat blood cultures negative to date. ID signed off 7/19.  A fib with RVR / PAF - Rate controlled. Heparin drip restarted. - 2D echo obtained and showed ejection fraction 60-65% without wall motion abnormalities. This is likely related to sepsis. - patient's CHA2DS2-VASc Score for Stroke Risk is 4, currently on heparin infusion. We'll plan to switch soon to Eliquis instead of Xarelto due to chronic kidney disease (awaiting urology workup) - continue po diltiazem able to be weaned off of his diltiazem infusion 7/17 - Telemetry with controlled ventricular rate.  Acute on chronic urinary retention - Urology input is highly appreciated. - renal US with moderate right-sided pelvicaliectasis, unchanged from 2013. His case was discussed with [name] from urology. It appears the patient has somewhat of a chronic retention issue, and he recommended placement of a Foley catheter and repeating the ultrasound of his kidneys 24 hours later to reevaluate whether this will improve the hydronephrosis.  - foley placed 7/17, repeat US done today 7/19---> shows resolving hydronephrosis - Urology consultation for hematuria appreciated. His hematuria is likely multifactorial in origin due to catheter placement while heparinized in combination with known history of radiation cystitis. He is already scheduled to be evaluated further with cystoscopy in the urology clinic Tuesday, August 1 at 3:45 PM. Neurology recommends continuing Foley for now as he had hydronephrosis and distended bladder before it was placed. - Due to hematuria, source needs to be determined given that patient is going to be on anticoagulation for DVT and A. fib,  discussed with urology regarding possibility of performing cystoscopy while on IV heparin infusion which can be briefly held. Discussed with [name], ID who indicated that it is okay to proceed with  cystoscopy since patient has received approximately 7 days of IV antibiotics. Updated [name], Urology  AKI on CKD 3 - AKI is slowly resolving.  - Baseline creatinine may be in the 1.9 range (May 2017). Presented with creatinine of 2.7. Has gradually improved to 2.29. Follow BMP in a.m.  DVT - Heparin restarted. Change back to Xarelto after bleeding risk is more tolerable. - recently diagnosed in May 2017 and on Xarelto since - given renal failure continue heparin infusion. We'll switch to Eliquis once assure no hematuria after foley placement - there was concern in the ER regarding his breathing status and underwent a VQ scan which was low probability for PE.   DM - SSI  Hyperkalemia - resolved  OSA - continue CPAP   HTN - controlled.  Anemia -stable.  DVT prophylaxis: heparin gtt (for Afib) Code Status: Full Family Communication: No family bedside this morning. Disposition Plan: Home 2-3 days  Consultants:   Infectious disease  Urology  Procedures:   2-D echo - Left ventricle: The cavity size was normal. There was mild focal basal and moderate concentric hypertrophy of the septum. Systolic function was normal. The estimated ejection fraction was in the range of 60% to 65%. Wall motion was normal; there were no regional wall motion abnormalities. Mitral valve: Moderately calcified annulus. Left atrium: The atrium was moderately dilated.  Antimicrobials:  Ceftriaxone 7/14 >> 7/17   Vancomycin 7/15 >> 7/17  Ampicillin 7/17 >>  Subjective: Denied complaints. No dyspnea or chest pain. As per RN, hematuria in bag has almost resolved.  Objective: Vitals:   10/15/15 0457 10/15/15 0458 10/15/15 1030 10/15/15 1355  BP: (!) 177/85  (!) 172/80 136/72  Pulse:  79  83  Resp:  16  18  Temp:  97.7 F (36.5 C)  98.4 F (36.9 C)  TempSrc:  Oral  Oral  SpO2:  100%  98%  Weight:      Height:        Intake/Output Summary (Last 24 hours) at 10/15/15 1622 Last  data filed at 10/15/15 1356  Gross per 24 hour  Intake              750 ml  Output             3100 ml  Net            -2350 ml   Filed Weights   10/06/15 1811 10/07/15 0400  Weight: 108 kg (238 lb) 102.8 kg (226 lb 10.1 oz)    Gen: Pleasant elderly male lying comfortably supine in bed. Neck: normal, supple, no masses, no thyromegaly Respiratory: clear to auscultation bilaterally, no wheezing, no crackles. Normal respiratory effort. No accessory muscle use.  Cardiovascular: irregular, no murmurs / rubs / gallops. Trace bilateral leg edema. Telemetry: A. fib with controlled ventricular rate.. Abdomen: no tenderness. Bowel sounds positive. Foley catheter with pinkish discoloration of urine. Neurologic: non focal . Alert and oriented.   Data Reviewed: I have personally reviewed following labs and imaging studies  CBC:  Recent Labs Lab 10/10/15 0509 10/11/15 0531 10/12/15 0544 10/13/15 0458 10/15/15 1207  WBC 11.9* 9.5 8.8 8.4 16.7*  HGB 11.4* 11.1* 10.9* 10.5* 11.1*  HCT 34.1* 33.6* 33.5* 32.0* 33.9*  MCV 91.4 91.6 93.1 93.6 93.4  PLT 212 256 273  292 0000000   Basic Metabolic Panel:  Recent Labs Lab 10/09/15 0324 10/10/15 0509 10/11/15 0531 10/13/15 0458 10/15/15 0507  NA 141 141 141 143 141  K 3.6 4.1 3.8 3.7 4.6  CL 112* 115* 114* 115* 114*  CO2 21* 21* 22 24 22   GLUCOSE 95 128* 116* 114* 152*  BUN 58* 52* 45* 33* 26*  CREATININE 2.69* 2.47* 2.29* 2.12* 1.91*  CALCIUM 10.3 10.4* 10.6* 10.8* 11.0*  PHOS  --   --   --   --  4.1   GFR: Estimated Creatinine Clearance: 38.3 mL/min (by C-G formula based on SCr of 1.91 mg/dL). Liver Function Tests:  Recent Labs Lab 10/15/15 0507  ALBUMIN 2.8*   No results for input(s): LIPASE, AMYLASE in the last 168 hours. No results for input(s): AMMONIA in the last 168 hours. Coagulation Profile: No results for input(s): INR, PROTIME in the last 168 hours. Cardiac Enzymes: No results for input(s): CKTOTAL, CKMB, CKMBINDEX,  TROPONINI in the last 168 hours. BNP (last 3 results) No results for input(s): PROBNP in the last 8760 hours. HbA1C: No results for input(s): HGBA1C in the last 72 hours. CBG:  Recent Labs Lab 10/14/15 1256 10/14/15 1752 10/14/15 2156 10/15/15 0819 10/15/15 1201  GLUCAP 130* 135* 185* 148* 184*   Lipid Profile: No results for input(s): CHOL, HDL, LDLCALC, TRIG, CHOLHDL, LDLDIRECT in the last 72 hours. Thyroid Function Tests: No results for input(s): TSH, T4TOTAL, FREET4, T3FREE, THYROIDAB in the last 72 hours. Anemia Panel: No results for input(s): VITAMINB12, FOLATE, FERRITIN, TIBC, IRON, RETICCTPCT in the last 72 hours. Urine analysis:    Component Value Date/Time   COLORURINE AMBER (A) 10/06/2015 1811   APPEARANCEUR TURBID (A) 10/06/2015 1811   LABSPEC 1.018 10/06/2015 1811   PHURINE 5.5 10/06/2015 1811   GLUCOSEU NEGATIVE 10/06/2015 1811   HGBUR LARGE (A) 10/06/2015 1811   BILIRUBINUR NEGATIVE 10/06/2015 1811   KETONESUR NEGATIVE 10/06/2015 1811   PROTEINUR >300 (A) 10/06/2015 1811   UROBILINOGEN 0.2 09/26/2012 1222   NITRITE POSITIVE (A) 10/06/2015 1811   LEUKOCYTESUR LARGE (A) 10/06/2015 1811   Sepsis Labs: Invalid input(s): PROCALCITONIN, LACTICIDVEN  Recent Results (from the past 240 hour(s))  Blood culture (routine x 2)     Status: Abnormal   Collection Time: 10/06/15  3:15 PM  Result Value Ref Range Status   Specimen Description BLOOD RIGHT ANTECUBITAL  Final   Special Requests BOTTLES DRAWN AEROBIC AND ANAEROBIC 5CC  Final   Culture  Setup Time   Final    GRAM POSITIVE COCCI IN CHAINS ANAEROBIC BOTTLE ONLY CRITICAL RESULT CALLED TO, READ BACK BY AND VERIFIED WITH: K. HASKINS RN AT [id number] 10/07/15 BY D. VANHOOK    Culture (A)  Final    ENTEROCOCCUS SPECIES STAPHYLOCOCCUS SPECIES (COAGULASE NEGATIVE) THE SIGNIFICANCE OF ISOLATING THIS ORGANISM FROM A SINGLE SET OF BLOOD CULTURES WHEN MULTIPLE SETS ARE DRAWN IS UNCERTAIN. PLEASE NOTIFY THE MICROBIOLOGY  DEPARTMENT WITHIN ONE WEEK IF SPECIATION AND SENSITIVITIES ARE REQUIRED. Performed at [facility]    Report Status 10/10/2015 FINAL  Final   Organism ID, Bacteria ENTEROCOCCUS SPECIES  Final      Susceptibility   Enterococcus species - MIC*    AMPICILLIN <=2 SENSITIVE Sensitive     VANCOMYCIN 1 SENSITIVE Sensitive     GENTAMICIN SYNERGY SENSITIVE Sensitive     * ENTEROCOCCUS SPECIES  Blood culture (routine x 2)     Status: None   Collection Time: 10/06/15  4:33 PM  Result Value  Ref Range Status   Specimen Description BLOOD LEFT HAND  Final   Special Requests IN PEDIATRIC BOTTLE 2.5CC  Final   Culture   Final    NO GROWTH 5 DAYS Performed at [facility]    Report Status 10/11/2015 FINAL  Final  MRSA PCR Screening     Status: None   Collection Time: 10/06/15 10:34 PM  Result Value Ref Range Status   MRSA by PCR NEGATIVE NEGATIVE Final    Comment:        The GeneXpert MRSA Assay (FDA approved for NASAL specimens only), is one component of a comprehensive MRSA colonization surveillance program. It is not intended to diagnose MRSA infection nor to guide or monitor treatment for MRSA infections.   Rapid strep screen (not at [facility])     Status: None   Collection Time: 10/07/15  9:45 AM  Result Value Ref Range Status   Streptococcus, Group A Screen (Direct) NEGATIVE NEGATIVE Final    Comment: (NOTE) A Rapid Antigen test may result negative if the antigen level in the sample is below the detection level of this test. The FDA has not cleared this test as a stand-alone test therefore the rapid antigen negative result has reflexed to a Group A Strep culture.   Culture, group A strep     Status: None   Collection Time: 10/07/15  9:45 AM  Result Value Ref Range Status   Specimen Description THROAT  Final   Special Requests NONE Reflexed from [id number]  Final   Culture   Final    NO GROUP A STREP (S.PYOGENES) ISOLATED Performed at [facility]    Report  Status 10/10/2015 FINAL  Final  Culture, blood (Routine X 2) w Reflex to ID Panel     Status: None   Collection Time: 10/09/15  3:38 PM  Result Value Ref Range Status   Specimen Description BLOOD LEFT HAND  Final   Special Requests BOTTLES DRAWN AEROBIC AND ANAEROBIC 5CC  Final   Culture   Final    NO GROWTH 5 DAYS Performed at [facility]    Report Status 10/14/2015 FINAL  Final  Surgical pcr screen     Status: None   Collection Time: 10/14/15 10:02 AM  Result Value Ref Range Status   MRSA, PCR NEGATIVE NEGATIVE Final   Staphylococcus aureus NEGATIVE NEGATIVE Final    Comment:        The Xpert SA Assay (FDA approved for NASAL specimens in patients over 86 years of age), is one component of a comprehensive surveillance program.  Test performance has been validated by [facility] for patients greater than or equal to 73 year old. It is not intended to diagnose infection nor to guide or monitor treatment.     Radiology Studies: No results found. Scheduled Meds: . amitriptyline  10 mg Oral QHS  . ampicillin (OMNIPEN) IV  2 g Intravenous Q6H  . calcium carbonate  1,250 mg Oral Q breakfast  . diltiazem  60 mg Oral Q6H  . gabapentin  100 mg Oral Daily  . insulin aspart  0-9 Units Subcutaneous TID WC  . QUEtiapine  25 mg Oral QHS  . tamsulosin  0.4 mg Oral Daily   Continuous Infusions: . sodium chloride 10 mL/hr at 10/11/15 0915  . heparin 1,450 Units/hr (10/15/15 0501)     Bonnell Public, MD. [facility] Pager [phone].  If 7PM-7AM, please contact night-coverage [email or web address] Password [facility] 10/15/2015, 4:22 PM

## 2015-10-15 NOTE — Progress Notes (Signed)
ANTICOAGULATION CONSULT NOTE - Follow up [place] for Heparin Indication: Hx DVT (home Xarelto on hold)  No Known Allergies  Patient Measurements: Height: 5\' 10"  (177.8 cm) Weight: 226 lb 10.1 oz (102.8 kg) IBW/kg (Calculated) : 73 Heparin Dosing Weight: 96.3kg  Vital Signs: Temp: 97.7 F (36.5 C) (07/23 0458) Temp Source: Oral (07/23 0458) BP: 172/80 (07/23 1030) Pulse Rate: 79 (07/23 0458)  Labs:  Recent Labs  10/13/15 0458 10/14/15 0541 10/15/15 0507 10/15/15 1207  HGB 10.5*  --   --  11.1*  HCT 32.0*  --   --  33.9*  PLT 292  --   --  293  HEPARINUNFRC 0.54 0.62  --  0.57  CREATININE 2.12*  --  1.91*  --     Estimated Creatinine Clearance: 38.3 mL/min (by C-G formula based on SCr of 1.91 mg/dL).   Medications:  PTA xarelto starter pack prescribed 08/15/15 however pt did not pick up prescription from pharmacy due to insurance issues, told EDP he was receiving xarelto samples from PCP.  Verified with patient on 7/15 -- he completed xarelto dose pack and taking 20 mg daily PTA.  LD 7/12 @ 9AM.   Assessment: 80 yoM with PMHx prostate cancer, DM, HTN, OSA on CPAP, asthma, and COPD with recent dx acute DVT in May 2017 taking xarelto samples from PCP now presents for evaluation after multiple falls. Pharmacy consulted to start IV heparin as pt has AKI.   7/22: Underwent cystoscopy with bilateral retrograde pyelogram, cystoscopy litholapaxy of multiple small stones/dystrophic calcification. Anticoag to resume 7/23 AM.   Today, 10/15/2015: - Heparin level in target range on current rate.  - CBC stable. Per RN, no bleeding. - SCr has improved to reported baseline ~1.9.   Goal of Therapy:  Heparin level 0.3-0.7 units/ml  Monitor platelets by anticoagulation protocol: Yes   Plan:  - Continue heparin infusion at 1450units/hr. - Check a confirmatory heparin level at 1800.  - Daily heparin level and CBC - Follow up long-term anticoagulation plan (If  planning for Eliquis dosing, recommend 5mg  PO BID for DVT treatment) - Monitor for hematuria.  Romeo Rabon, PharmD, pager [phone]. 10/15/2015,12:55 PM.

## 2015-10-16 ENCOUNTER — Encounter (HOSPITAL_COMMUNITY): Payer: Self-pay | Admitting: Urology

## 2015-10-16 DIAGNOSIS — I10 Essential (primary) hypertension: Secondary | ICD-10-CM

## 2015-10-16 LAB — CBC WITH DIFFERENTIAL/PLATELET
Basophils Absolute: 0 10*3/uL (ref 0.0–0.1)
Basophils Relative: 0 %
Eosinophils Absolute: 0 10*3/uL (ref 0.0–0.7)
Eosinophils Relative: 0 %
HCT: 31.7 % — ABNORMAL LOW (ref 39.0–52.0)
Hemoglobin: 10.8 g/dL — ABNORMAL LOW (ref 13.0–17.0)
Lymphocytes Relative: 10 %
Lymphs Abs: 1 10*3/uL (ref 0.7–4.0)
MCH: 32.8 pg (ref 26.0–34.0)
MCHC: 34.1 g/dL (ref 30.0–36.0)
MCV: 96.4 fL (ref 78.0–100.0)
Monocytes Absolute: 0.3 10*3/uL (ref 0.1–1.0)
Monocytes Relative: 3 %
Neutro Abs: 8.8 10*3/uL — ABNORMAL HIGH (ref 1.7–7.7)
Neutrophils Relative %: 87 %
Platelets: 222 10*3/uL (ref 150–400)
RBC: 3.29 MIL/uL — ABNORMAL LOW (ref 4.22–5.81)
RDW: 14.5 % (ref 11.5–15.5)
WBC: 10 10*3/uL (ref 4.0–10.5)

## 2015-10-16 LAB — URINE CULTURE: Culture: NO GROWTH

## 2015-10-16 LAB — GLUCOSE, CAPILLARY
Glucose-Capillary: 119 mg/dL — ABNORMAL HIGH (ref 65–99)
Glucose-Capillary: 125 mg/dL — ABNORMAL HIGH (ref 65–99)
Glucose-Capillary: 119 mg/dL — ABNORMAL HIGH (ref 65–99)
Glucose-Capillary: 136 mg/dL — ABNORMAL HIGH (ref 65–99)

## 2015-10-16 LAB — HEPARIN LEVEL (UNFRACTIONATED): Heparin Unfractionated: 0.69 IU/mL (ref 0.30–0.70)

## 2015-10-16 MED ORDER — LISINOPRIL 20 MG PO TABS
40.0000 mg | ORAL_TABLET | Freq: Every day | ORAL | Status: DC
  Administered 2015-10-16 – 2015-10-18 (×3): 40 mg via ORAL
  Filled 2015-10-16 (×3): qty 2

## 2015-10-16 MED ORDER — APIXABAN 5 MG PO TABS
5.0000 mg | ORAL_TABLET | Freq: Two times a day (BID) | ORAL | Status: DC
  Administered 2015-10-16 – 2015-10-18 (×4): 5 mg via ORAL
  Filled 2015-10-16 (×4): qty 1

## 2015-10-16 NOTE — Progress Notes (Signed)
PT Cancellation Note  Patient Details Name: Roy [name] MRN: [id number] DOB: 02/19/1936   Cancelled Treatment:    Reason Eval/Treat Not Completed: Attempted PT tx session-pt declined to participate. Will check back another day.    Weston Anna, MPT Pager: [phone]

## 2015-10-16 NOTE — Progress Notes (Signed)
   Foley out. Pt voiding without difficulty. Heparin restarted yesterday. Urine remained clear.   PE: NAD Eating lunch Urine in urinal - clear yellow  A/P - gross hematuria  - resolved. Will sign off. F/u later this week at [facility].

## 2015-10-16 NOTE — Progress Notes (Signed)
ANTICOAGULATION CONSULT NOTE - Follow Up Consult  Pharmacy Consult for Heparin Indication: Hx DVT (home Xarelto on hold)  No Known Allergies  Patient Measurements: Height: 5\' 10"  (177.8 cm) Weight: 226 lb 10.1 oz (102.8 kg) IBW/kg (Calculated) : 73 Heparin Dosing Weight:   Vital Signs: Temp: 97.7 F (36.5 C) (07/24 0457) Temp Source: Oral (07/24 0457) BP: 152/65 (07/24 0457) Pulse Rate: 65 (07/24 0457)  Labs:  Recent Labs  10/15/15 0507 10/15/15 1207 10/15/15 1824 10/16/15 0458  HGB  --  11.1*  --  10.8*  HCT  --  33.9*  --  31.7*  PLT  --  293  --  222  HEPARINUNFRC  --  0.57 0.69 0.69  CREATININE 1.91*  --   --   --     Estimated Creatinine Clearance: 38.3 mL/min (by C-G formula based on SCr of 1.91 mg/dL).   Medications:  Infusions:  . sodium chloride 10 mL/hr at 10/11/15 0915  . heparin 1,300 Units/hr (10/15/15 2308)    Assessment: Patient with heparin level at goal but higher end of goal.  No heparin issues per RN.  Goal of Therapy:  Heparin level 0.3-0.7 units/ml Monitor platelets by anticoagulation protocol: Yes   Plan:  Decrease heparin to 1100 units/hr Recheck level at [address] [place] 10/16/2015,6:06 AM

## 2015-10-16 NOTE — Progress Notes (Signed)
ANTICOAGULATION CONSULT NOTE - Follow up [place] [name] for Heparin Indication: Hx DVT (home Xarelto on hold)  No Known Allergies  Patient Measurements: Height: 5\' 10"  (177.8 cm) Weight: 226 lb 10.1 oz (102.8 kg) IBW/kg (Calculated) : 73 Heparin Dosing Weight: 96.3kg  Vital Signs: Temp: 97.7 F (36.5 C) (07/24 0457) Temp Source: Oral (07/24 0457) BP: 164/69 (07/24 1025) Pulse Rate: 69 (07/24 1025)  Labs:  Recent Labs  10/15/15 0507 10/15/15 1207 10/15/15 1824 10/16/15 0458  HGB  --  11.1*  --  10.8*  HCT  --  33.9*  --  31.7*  PLT  --  293  --  222  HEPARINUNFRC  --  0.57 0.69 0.69  CREATININE 1.91*  --   --   --     Estimated Creatinine Clearance: 38.3 mL/min (by C-G formula based on SCr of 1.91 mg/dL).   Medications:  PTA xarelto starter pack prescribed 08/15/15 however pt did not pick up prescription from pharmacy due to insurance issues, told EDP he was receiving xarelto samples from PCP.  Verified with patient on 7/15 -- he completed xarelto dose pack and taking 20 mg daily PTA.  LD 7/12 @ 9AM.   Assessment: 15 yoM with PMHx prostate cancer, DM, HTN, OSA on CPAP, asthma, and COPD with recent dx acute DVT in May 2017 taking xarelto samples from PCP now presents for evaluation after multiple falls. Pharmacy consulted to start IV heparin as pt has AKI.   7/22: Underwent cystoscopy with bilateral retrograde pyelogram, cystoscopy litholapaxy of multiple small stones/dystrophic calcification. Anticoag to resume 7/23 AM.   Today, 10/16/2015: - CBC stable.  - SCr has improved to reported baseline ~1.9.   Goal of Therapy:  Heparin level 0.3-0.7 units/ml  Monitor platelets by anticoagulation protocol: Yes   Plan:  - stop heparin infusion at 1400 - start eliquis 5mg  po bid at 1800 tonight  - Monitor for hematuria.  Dolly Rias RPh 10/16/2015, 11:51 AM Pager [phone]

## 2015-10-16 NOTE — Progress Notes (Signed)
Nutrition Brief Note  Patient identified for LOS of 10 days.   Wt Readings from Last 15 Encounters:  10/07/15 226 lb 10.1 oz (102.8 kg)  08/15/15 234 lb 9.1 oz (106.4 kg)  11/11/14 234 lb 9.6 oz (106.4 kg)  11/10/13 226 lb (102.5 kg)  11/06/12 224 lb 3.2 oz (101.7 kg)  09/27/12 214 lb 1.1 oz (97.1 kg)  02/25/12 234 lb (106.1 kg)  10/17/11 229 lb (103.9 kg)  10/14/11 237 lb 6.4 oz (107.7 kg)  10/09/10 246 lb 9.6 oz (111.9 kg)  03/13/10 (!) 249 lb 4 oz (113.1 kg)  03/13/09 (!) 249 lb 6.1 oz (113.1 kg)  03/10/08 (!) 249 lb 4 oz (113.1 kg)  03/11/07 (!) 256 lb 8 oz (116.3 kg)    Body mass index is 32.52 kg/m. Patient meets criteria for obesity based on current BMI.   Pt has an incision to perineum from 10/14/15 from cystoscopy with bilateral retrograde pyelogram/fulgeration with litholapaxy.   Current diet order is Carb Modified, patient is consuming 75-100% of meals mainly since admission. Labs reviewed. Medications reviewed; sliding scale Novolog.   No nutrition interventions warranted at this time. If nutrition issues arise, please consult RD.     Jarome Matin, MS, RD, LDN Inpatient Clinical Dietitian Pager # [phone] After hours/weekend pager # [phone]

## 2015-10-16 NOTE — Progress Notes (Signed)
PROGRESS NOTE  Roy [name] [id number] DOB: 1935-06-06 DOA: 10/06/2015 PCP: Rogers Blocker, MD   LOS: 10 days   Brief Narrative: 80 y.o. male with medical history significant of DM, HTN, OSA, recent DVT in leg in May on Xarelto, remote history of prostate cancer in 2006 with radiation, prolonged remission since then, admitted on 7/14 with weakness/falls and tiredness, found by family on the floor. Found to be septic.  Assessment & Plan: Principal Problem:   Sepsis secondary to UTI [facility]) Active Problems:   DM2 (diabetes mellitus, type 2) ([place])   Obstructive sleep apnea   Essential hypertension   CKD (chronic kidney disease) stage 3, GFR 30-59 ml/min   AKI (acute kidney injury) ([place])   UTI (lower urinary tract infection)   Bacteremia due to Enterococcus   Acute deep vein thrombosis (DVT) of femoral vein of left lower extremity (HCC)  Sepsis secondary to UTI / enterococcus bacteremia - Resolved significantly. Will change DC Heparin drip and start Eliquis after 4 hours (discussed with Dr Junious Silk and Pharmacy). Urine remains clear. Foley is out. Likely DC in am if no further bleeding. - sepsis physiology resolved - Blood cultures speciated enterococcus which is sensitive to ampicillin, 1/2 bottles. Not a contaminant - now on ampicillin. ID following. TEE without vegetations found - Urine cultures on admission ordered however unfortunately not obtained since they were automatically canceled by lab - per [name], since TEE negative, will need total of 14 days of antibiotics, minimum 7 of IV - As per ID note 7/19: Continue ampicillin and aim for a total 2 week course. If he is improved enough to go home prior to end of this, could consider amoxicillin to complete course. He also had coagulase-negative staph in blood culture. This did not appear in his repeat culture, and not in his follow-up culture (he received 3 days of vancomycin). Suspect this was a contaminant. Repeat blood  cultures negative to date. ID signed off 7/19.  A fib with RVR / PAF - Rate controlled. Heparin drip will be stopped for 4 hours, and Eliquis started. - 2D echo obtained and showed ejection fraction 60-65% without wall motion abnormalities. This is likely related to sepsis. - patient's CHA2DS2-VASc Score for Stroke Risk is 4, currently on heparin infusion. We'll plan to switch soon to Eliquis instead of Xarelto due to chronic kidney disease (awaiting urology workup) - continue po diltiazem able to be weaned off of his diltiazem infusion 7/17 - Telemetry with controlled ventricular rate.  Acute on chronic urinary retention - Urology input is highly appreciated. Foley is discontinued. - renal US with moderate right-sided pelvicaliectasis, unchanged from 2013. His case was discussed with [name] from urology. It appears the patient has somewhat of a chronic retention issue, and he recommended placement of a Foley catheter and repeating the ultrasound of his kidneys 24 hours later to reevaluate whether this will improve the hydronephrosis.  - foley placed 7/17, repeat US done today 7/19---> shows resolving hydronephrosis - Urology consultation for hematuria appreciated. His hematuria is likely multifactorial in origin due to catheter placement while heparinized in combination with known history of radiation cystitis. He is already scheduled to be evaluated further with cystoscopy in the urology clinic Tuesday, August 1 at 3:45 PM. Neurology recommends continuing Foley for now as he had hydronephrosis and distended bladder before it was placed. - Due to hematuria, source needs to be determined given that patient is going to be on anticoagulation for DVT and A. fib,  discussed with urology regarding possibility of performing cystoscopy while on IV heparin infusion which can be briefly held. Discussed with [name], ID who indicated that it is okay to proceed with cystoscopy since patient has received  approximately 7 days of IV antibiotics. Updated [name], Urology  AKI on CKD 3 - AKI has resolved. Patient has baseline CKD.  - Baseline creatinine may be in the 1.9 range (May 2017). Presented with creatinine of 2.7. Has gradually improved to 2.29. Follow BMP in a.m.  DVT - See above. For Eliquis. - recently diagnosed in May 2017 and on Xarelto since - given renal failure continue heparin infusion. We'll switch to Eliquis once assure no hematuria after foley placement - there was concern in the ER regarding his breathing status and underwent a VQ scan which was low probability for PE.   DM - SSI  Hyperkalemia - resolved  OSA - continue CPAP   HTN - controlled.  Anemia -stable.  DVT prophylaxis: heparin gtt (for Afib) Code Status: Full Family Communication: No family bedside this morning. Disposition Plan: Home 2-3 days  Consultants:   Infectious disease  Urology  Procedures:   2-D echo - Left ventricle: The cavity size was normal. There was mild focal basal and moderate concentric hypertrophy of the septum. Systolic function was normal. The estimated ejection fraction was in the range of 60% to 65%. Wall motion was normal; there were no regional wall motion abnormalities. Mitral valve: Moderately calcified annulus. Left atrium: The atrium was moderately dilated.  Antimicrobials:  Ceftriaxone 7/14 >> 7/17   Vancomycin 7/15 >> 7/17  Ampicillin 7/17 >>  Subjective: Denied complaints. No dyspnea or chest pain. As per RN, hematuria in bag has almost resolved.  Objective: Vitals:   10/15/15 1355 10/15/15 2146 10/16/15 0457 10/16/15 1025  BP: 136/72 (!) 153/117 (!) 152/65 (!) 164/69  Pulse: 83 85 65 69  Resp: 18 18    Temp: 98.4 F (36.9 C) 98.3 F (36.8 C) 97.7 F (36.5 C)   TempSrc: Oral Oral Oral   SpO2: 98% 97% 96% 98%  Weight:      Height:        Intake/Output Summary (Last 24 hours) at 10/16/15 1303 Last data filed at 10/16/15 1135  Gross  per 24 hour  Intake             1110 ml  Output             2075 ml  Net             -965 ml   Filed Weights   10/06/15 1811 10/07/15 0400  Weight: 108 kg (238 lb) 102.8 kg (226 lb 10.1 oz)    Gen: Pleasant elderly male lying comfortably supine in bed. Neck: normal, supple, no masses, no thyromegaly Respiratory: clear to auscultation bilaterally, no wheezing, no crackles. Normal respiratory effort. No accessory muscle use.  Cardiovascular: irregular, no murmurs / rubs / gallops. Trace bilateral leg edema. Telemetry: A. fib with controlled ventricular rate.. Abdomen: no tenderness. Bowel sounds positive. Foley catheter with pinkish discoloration of urine. Neurologic: non focal . Alert and oriented.   Data Reviewed: I have personally reviewed following labs and imaging studies  CBC:  Recent Labs Lab 10/11/15 0531 10/12/15 0544 10/13/15 0458 10/15/15 1207 10/16/15 0458  WBC 9.5 8.8 8.4 16.7* 10.0  NEUTROABS  --   --   --   --  8.8*  HGB 11.1* 10.9* 10.5* 11.1* 10.8*  HCT 33.6* 33.5* 32.0*  33.9* 31.7*  MCV 91.6 93.1 93.6 93.4 96.4  PLT 256 273 292 293 [id number]   Basic Metabolic Panel:  Recent Labs Lab 10/10/15 0509 10/11/15 0531 10/13/15 0458 10/15/15 0507  NA 141 141 143 141  K 4.1 3.8 3.7 4.6  CL 115* 114* 115* 114*  CO2 21* 22 24 22   GLUCOSE 128* 116* 114* 152*  BUN 52* 45* 33* 26*  CREATININE 2.47* 2.29* 2.12* 1.91*  CALCIUM 10.4* 10.6* 10.8* 11.0*  PHOS  --   --   --  4.1   GFR: Estimated Creatinine Clearance: 38.3 mL/min (by C-G formula based on SCr of 1.91 mg/dL). Liver Function Tests:  Recent Labs Lab 10/15/15 0507  ALBUMIN 2.8*   No results for input(s): LIPASE, AMYLASE in the last 168 hours. No results for input(s): AMMONIA in the last 168 hours. Coagulation Profile: No results for input(s): INR, PROTIME in the last 168 hours. Cardiac Enzymes: No results for input(s): CKTOTAL, CKMB, CKMBINDEX, TROPONINI in the last 168 hours. BNP (last 3  results) No results for input(s): PROBNP in the last 8760 hours. HbA1C: No results for input(s): HGBA1C in the last 72 hours. CBG:  Recent Labs Lab 10/15/15 1201 10/15/15 1652 10/15/15 2150 10/16/15 0729 10/16/15 1155  GLUCAP 184* 134* 139* 119* 125*   Lipid Profile: No results for input(s): CHOL, HDL, LDLCALC, TRIG, CHOLHDL, LDLDIRECT in the last 72 hours. Thyroid Function Tests: No results for input(s): TSH, T4TOTAL, FREET4, T3FREE, THYROIDAB in the last 72 hours. Anemia Panel: No results for input(s): VITAMINB12, FOLATE, FERRITIN, TIBC, IRON, RETICCTPCT in the last 72 hours. Urine analysis:    Component Value Date/Time   COLORURINE YELLOW 10/15/2015 1621   APPEARANCEUR CLOUDY (A) 10/15/2015 1621   LABSPEC 1.019 10/15/2015 1621   PHURINE 6.0 10/15/2015 1621   GLUCOSEU NEGATIVE 10/15/2015 1621   HGBUR LARGE (A) 10/15/2015 1621   BILIRUBINUR NEGATIVE 10/15/2015 1621   KETONESUR NEGATIVE 10/15/2015 1621   PROTEINUR 100 (A) 10/15/2015 1621   UROBILINOGEN 0.2 09/26/2012 1222   NITRITE NEGATIVE 10/15/2015 1621   LEUKOCYTESUR TRACE (A) 10/15/2015 1621   Sepsis Labs: Invalid input(s): PROCALCITONIN, LACTICIDVEN  Recent Results (from the past 240 hour(s))  Blood culture (routine x 2)     Status: Abnormal   Collection Time: 10/06/15  3:15 PM  Result Value Ref Range Status   Specimen Description BLOOD RIGHT ANTECUBITAL  Final   Special Requests BOTTLES DRAWN AEROBIC AND ANAEROBIC 5CC  Final   Culture  Setup Time   Final    GRAM POSITIVE COCCI IN CHAINS ANAEROBIC BOTTLE ONLY CRITICAL RESULT CALLED TO, READ BACK BY AND VERIFIED WITH: K. HASKINS RN AT [id number] 10/07/15 BY D. VANHOOK    Culture (A)  Final    ENTEROCOCCUS SPECIES STAPHYLOCOCCUS SPECIES (COAGULASE NEGATIVE) THE SIGNIFICANCE OF ISOLATING THIS ORGANISM FROM A SINGLE SET OF BLOOD CULTURES WHEN MULTIPLE SETS ARE DRAWN IS UNCERTAIN. PLEASE NOTIFY THE MICROBIOLOGY DEPARTMENT WITHIN ONE WEEK IF SPECIATION AND SENSITIVITIES  ARE REQUIRED. Performed at [facility]    Report Status 10/10/2015 FINAL  Final   Organism ID, Bacteria ENTEROCOCCUS SPECIES  Final      Susceptibility   Enterococcus species - MIC*    AMPICILLIN <=2 SENSITIVE Sensitive     VANCOMYCIN 1 SENSITIVE Sensitive     GENTAMICIN SYNERGY SENSITIVE Sensitive     * ENTEROCOCCUS SPECIES  Blood culture (routine x 2)     Status: None   Collection Time: 10/06/15  4:33 PM  Result Value Ref  Range Status   Specimen Description BLOOD LEFT HAND  Final   Special Requests IN PEDIATRIC BOTTLE 2.5CC  Final   Culture   Final    NO GROWTH 5 DAYS Performed at [facility]    Report Status 10/11/2015 FINAL  Final  MRSA PCR Screening     Status: None   Collection Time: 10/06/15 10:34 PM  Result Value Ref Range Status   MRSA by PCR NEGATIVE NEGATIVE Final    Comment:        The GeneXpert MRSA Assay (FDA approved for NASAL specimens only), is one component of a comprehensive MRSA colonization surveillance program. It is not intended to diagnose MRSA infection nor to guide or monitor treatment for MRSA infections.   Rapid strep screen (not at [facility])     Status: None   Collection Time: 10/07/15  9:45 AM  Result Value Ref Range Status   Streptococcus, Group A Screen (Direct) NEGATIVE NEGATIVE Final    Comment: (NOTE) A Rapid Antigen test may result negative if the antigen level in the sample is below the detection level of this test. The FDA has not cleared this test as a stand-alone test therefore the rapid antigen negative result has reflexed to a Group A Strep culture.   Culture, group A strep     Status: None   Collection Time: 10/07/15  9:45 AM  Result Value Ref Range Status   Specimen Description THROAT  Final   Special Requests NONE Reflexed from [id number]  Final   Culture   Final    NO GROUP A STREP (S.PYOGENES) ISOLATED Performed at [facility]    Report Status 10/10/2015 FINAL  Final  Culture, blood (Routine X 2) w  Reflex to ID Panel     Status: None   Collection Time: 10/09/15  3:38 PM  Result Value Ref Range Status   Specimen Description BLOOD LEFT HAND  Final   Special Requests BOTTLES DRAWN AEROBIC AND ANAEROBIC 5CC  Final   Culture   Final    NO GROWTH 5 DAYS Performed at [facility]    Report Status 10/14/2015 FINAL  Final  Surgical pcr screen     Status: None   Collection Time: 10/14/15 10:02 AM  Result Value Ref Range Status   MRSA, PCR NEGATIVE NEGATIVE Final   Staphylococcus aureus NEGATIVE NEGATIVE Final    Comment:        The Xpert SA Assay (FDA approved for NASAL specimens in patients over 78 years of age), is one component of a comprehensive surveillance program.  Test performance has been validated by [facility] for patients greater than or equal to 75 year old. It is not intended to diagnose infection nor to guide or monitor treatment.     Radiology Studies: No results found. Scheduled Meds: . amitriptyline  10 mg Oral QHS  . ampicillin (OMNIPEN) IV  2 g Intravenous Q6H  . apixaban  5 mg Oral BID  . calcium carbonate  1,250 mg Oral Q breakfast  . diltiazem  60 mg Oral Q6H  . gabapentin  100 mg Oral Daily  . insulin aspart  0-9 Units Subcutaneous TID WC  . lisinopril  40 mg Oral Daily  . QUEtiapine  25 mg Oral QHS  . tamsulosin  0.4 mg Oral Daily   Continuous Infusions: . sodium chloride 10 mL/hr at 10/11/15 0915  . heparin 1,100 Units/hr (10/16/15 [id number])     Bonnell Public, MD. [facility] Pager [phone].  If 7PM-7AM, please contact night-coverage [email or web address] Password [email or web address] 10/16/2015, 1:03 PM

## 2015-10-17 LAB — CBC
HCT: 32.4 % — ABNORMAL LOW (ref 39.0–52.0)
Hemoglobin: 10.6 g/dL — ABNORMAL LOW (ref 13.0–17.0)
MCH: 30.8 pg (ref 26.0–34.0)
MCHC: 32.7 g/dL (ref 30.0–36.0)
MCV: 94.2 fL (ref 78.0–100.0)
Platelets: 296 10*3/uL (ref 150–400)
RBC: 3.44 MIL/uL — ABNORMAL LOW (ref 4.22–5.81)
RDW: 15.1 % (ref 11.5–15.5)
WBC: 11.2 10*3/uL — ABNORMAL HIGH (ref 4.0–10.5)

## 2015-10-17 LAB — GLUCOSE, CAPILLARY
GLUCOSE-CAPILLARY: 108 mg/dL — AB (ref 65–99)
Glucose-Capillary: 123 mg/dL — ABNORMAL HIGH (ref 65–99)
Glucose-Capillary: 117 mg/dL — ABNORMAL HIGH (ref 65–99)
Glucose-Capillary: 118 mg/dL — ABNORMAL HIGH (ref 65–99)

## 2015-10-17 LAB — RENAL FUNCTION PANEL
Albumin: 2.8 g/dL — ABNORMAL LOW (ref 3.5–5.0)
Anion gap: 4 — ABNORMAL LOW (ref 5–15)
BUN: 36 mg/dL — ABNORMAL HIGH (ref 6–20)
CO2: 22 mmol/L (ref 22–32)
Calcium: 11 mg/dL — ABNORMAL HIGH (ref 8.9–10.3)
Chloride: 115 mmol/L — ABNORMAL HIGH (ref 101–111)
Creatinine, Ser: 1.76 mg/dL — ABNORMAL HIGH (ref 0.61–1.24)
GFR calc Af Amer: 41 mL/min — ABNORMAL LOW (ref >60)
GFR calc non Af Amer: 35 mL/min — ABNORMAL LOW (ref >60)
Glucose, Bld: 98 mg/dL (ref 65–99)
Phosphorus: 2.4 mg/dL — ABNORMAL LOW (ref 2.5–4.6)
Potassium: 4.3 mmol/L (ref 3.5–5.1)
Sodium: 141 mmol/L (ref 135–145)

## 2015-10-17 NOTE — Progress Notes (Addendum)
3 Days Post-Op Subjective: Patient reports decreased UO today.  He has also had [place] today.  PVR 84ml.  [name] requested cath be replaced.   Objective: Vital signs in last 24 hours: Temp:  [97.8 F (36.6 C)-98.4 F (36.9 C)] 97.9 F (36.6 C) (07/25 1250) Pulse Rate:  [66-75] 66 (07/25 1250) Resp:  [16-20] 16 (07/25 1250) BP: (149-164)/(58-79) 155/65 (07/25 1250) SpO2:  [96 %-97 %] 96 % (07/25 1250)  Intake/Output from previous day: 07/24 0701 - 07/25 0700 In: 560.5 [P.O.:240; I.V.:120.5; IV Piggyback:200] Out: 1250 [Urine:1250] Intake/Output this shift: Total I/O In: 150 [P.O.:150] Out: 2100 [Urine:2100]  Physical Exam:  General:alert, cooperative and no distress GI: soft, non tender, normal bowel sounds, no palpable masses,    Lab Results:  Recent Labs  10/15/15 1207 10/16/15 0458 10/17/15 0450  HGB 11.1* 10.8* 10.6*  HCT 33.9* 31.7* 32.4*   BMET  Recent Labs  10/15/15 0507 10/17/15 0450  NA 141 141  K 4.6 4.3  CL 114* 115*  CO2 22 22  GLUCOSE 152* 98  BUN 26* 36*  CREATININE 1.91* 1.76*  CALCIUM 11.0* 11.0*   No results for input(s): LABPT, INR in the last 72 hours. No results for input(s): LABURIN in the last 72 hours. Results for orders placed or performed during the hospital encounter of 10/06/15  Blood culture (routine x 2)     Status: Abnormal   Collection Time: 10/06/15  3:15 PM  Result Value Ref Range Status   Specimen Description BLOOD RIGHT ANTECUBITAL  Final   Special Requests BOTTLES DRAWN AEROBIC AND ANAEROBIC 5CC  Final   Culture  Setup Time   Final    GRAM POSITIVE COCCI IN CHAINS ANAEROBIC BOTTLE ONLY CRITICAL RESULT CALLED TO, READ BACK BY AND VERIFIED WITH: K. HASKINS RN AT [id number] 10/07/15 BY D. VANHOOK    Culture (A)  Final    ENTEROCOCCUS SPECIES STAPHYLOCOCCUS SPECIES (COAGULASE NEGATIVE) THE SIGNIFICANCE OF ISOLATING THIS ORGANISM FROM A SINGLE SET OF BLOOD CULTURES WHEN MULTIPLE SETS ARE DRAWN IS UNCERTAIN. PLEASE NOTIFY  THE MICROBIOLOGY DEPARTMENT WITHIN ONE WEEK IF SPECIATION AND SENSITIVITIES ARE REQUIRED. Performed at [facility]    Report Status 10/10/2015 FINAL  Final   Organism ID, Bacteria ENTEROCOCCUS SPECIES  Final      Susceptibility   Enterococcus species - MIC*    AMPICILLIN <=2 SENSITIVE Sensitive     VANCOMYCIN 1 SENSITIVE Sensitive     GENTAMICIN SYNERGY SENSITIVE Sensitive     * ENTEROCOCCUS SPECIES  Blood culture (routine x 2)     Status: None   Collection Time: 10/06/15  4:33 PM  Result Value Ref Range Status   Specimen Description BLOOD LEFT HAND  Final   Special Requests IN PEDIATRIC BOTTLE 2.5CC  Final   Culture   Final    NO GROWTH 5 DAYS Performed at [facility]    Report Status 10/11/2015 FINAL  Final  MRSA PCR Screening     Status: None   Collection Time: 10/06/15 10:34 PM  Result Value Ref Range Status   MRSA by PCR NEGATIVE NEGATIVE Final    Comment:        The GeneXpert MRSA Assay (FDA approved for NASAL specimens only), is one component of a comprehensive MRSA colonization surveillance program. It is not intended to diagnose MRSA infection nor to guide or monitor treatment for MRSA infections.   Rapid strep screen (not at [facility])     Status: None   Collection Time:  10/07/15  9:45 AM  Result Value Ref Range Status   Streptococcus, Group A Screen (Direct) NEGATIVE NEGATIVE Final    Comment: (NOTE) A Rapid Antigen test may result negative if the antigen level in the sample is below the detection level of this test. The FDA has not cleared this test as a stand-alone test therefore the rapid antigen negative result has reflexed to a Group A Strep culture.   Culture, group A strep     Status: None   Collection Time: 10/07/15  9:45 AM  Result Value Ref Range Status   Specimen Description THROAT  Final   Special Requests NONE Reflexed from [id number]  Final   Culture   Final    NO GROUP A STREP (S.PYOGENES) ISOLATED Performed at [facility]    Report Status 10/10/2015 FINAL  Final  Culture, blood (Routine X 2) w Reflex to ID Panel     Status: None   Collection Time: 10/09/15  3:38 PM  Result Value Ref Range Status   Specimen Description BLOOD LEFT HAND  Final   Special Requests BOTTLES DRAWN AEROBIC AND ANAEROBIC 5CC  Final   Culture   Final    NO GROWTH 5 DAYS Performed at [facility]    Report Status 10/14/2015 FINAL  Final  Surgical pcr screen     Status: None   Collection Time: 10/14/15 10:02 AM  Result Value Ref Range Status   MRSA, PCR NEGATIVE NEGATIVE Final   Staphylococcus aureus NEGATIVE NEGATIVE Final    Comment:        The Xpert SA Assay (FDA approved for NASAL specimens in patients over 74 years of age), is one component of a comprehensive surveillance program.  Test performance has been validated by [facility] for patients greater than or equal to 68 year old. It is not intended to diagnose infection nor to guide or monitor treatment.   Culture, Urine     Status: None   Collection Time: 10/15/15  4:22 PM  Result Value Ref Range Status   Specimen Description URINE, CLEAN CATCH  Final   Special Requests NONE  Final   Culture NO GROWTH Performed at [facility]   Final   Report Status 10/16/2015 FINAL  Final    Studies/Results: No results found.   Area prepped and draped in sterile fashion.  70f foley placed without difficulty.  10cc sterile saline used to inflate foley balloon.  Immediate return of 1257ml dark bloody urine.  Pt tolerated well.    Assessment/Plan: 3 Days Post-Op Procedure(s) (LRB): CYSTOSCOPY WITH BILATERAL RETROGRADE PYELOGRAM/ FULGERATION WITH LITHOLAPAXY (Bilateral)  Pt back in retention with grossly bloody urine.  Leave foley.  Irrigate prn.   LOS: 11 days   [place] 10/17/2015, 2:53 PM  ADD: Pt seen and examined this evening on rounds. Urine now clear. Home with foley tomorrow for at least 2 weeks of catheter drainage. Discussed with  patient and sisters.

## 2015-10-17 NOTE — Progress Notes (Signed)
PROGRESS NOTE  Roy [name] [id number] DOB: 01/03/1936 DOA: 10/06/2015 PCP: Rogers Blocker, MD   LOS: 11 days   Brief Narrative: 80 y.o. male with medical history significant of DM, HTN, OSA, recent DVT in leg in May on Xarelto, remote history of prostate cancer in 2006 with radiation, prolonged remission since then, admitted on 7/14 with weakness/falls and tiredness, found by family on the floor. Found to be septic.  Patient has undergone cystoscopy and back on anticoagulation. As per Urology, bladder stones were removed.  Assessment & Plan: Principal Problem:   Sepsis secondary to UTI [facility]) Active Problems:   DM2 (diabetes mellitus, type 2) ([place])   Obstructive sleep apnea   Essential hypertension   CKD (chronic kidney disease) stage 3, GFR 30-59 ml/min   AKI (acute kidney injury) ([place])   UTI (lower urinary tract infection)   Bacteremia due to Enterococcus   Acute deep vein thrombosis (DVT) of femoral vein of left lower extremity (HCC)  Sepsis secondary to UTI / enterococcus bacteremia - Resolved significantly.  Complete course of antibiotics on 10/24/15. - sepsis physiology resolved - Blood cultures speciated enterococcus which is sensitive to ampicillin, 1/2 bottles. Not a contaminant - now on ampicillin (started 10/09/15, and needs 2 week course). TEE without vegetations found - Urine cultures on admission ordered however unfortunately not obtained since they were automatically canceled by lab - per [name], since TEE negative, will need total of 14 days of antibiotics, minimum 7 of IV - As per ID note 7/19: Continue ampicillin and aim for a total 2 week course. If he is improved enough to go home prior to end of this, could consider amoxicillin to complete course. He also had coagulase-negative staph in blood culture. This did not appear in his repeat culture, and not in his follow-up culture (he received 3 days of vancomycin). Suspect this was a contaminant. Repeat blood  cultures negative to date. ID signed off 7/19.  A fib with RVR / PAF - Rate controlled. Now on Eliquis, but urine is bloody. - 2D echo obtained and showed ejection fraction 60-65% without wall motion abnormalities. This is likely related to sepsis. - patient's CHA2DS2-VASc Score for Stroke Risk is 4, currently on heparin infusion. We'll plan to switch soon to Eliquis instead of Xarelto due to chronic kidney disease (awaiting urology workup) - continue po diltiazem able to be weaned off of his diltiazem infusion 7/17 - Telemetry with controlled ventricular rate.  Acute on chronic urinary retention - Urology input is highly appreciated. Thin bloody urine reported today but patient had 800cc urine retention. Foley reinserted by Urology. Likely DC home when cleared by Urology. - renal US with moderate right-sided pelvicaliectasis, unchanged from 2013. His case was discussed with [name] from urology. It appears the patient has somewhat of a chronic retention issue, and he recommended placement of a Foley catheter and repeating the ultrasound of his kidneys 24 hours later to reevaluate whether this will improve the hydronephrosis.  - foley placed 7/17, repeat US done today 7/19---> shows resolving hydronephrosis - Urology consultation for hematuria appreciated. His hematuria is likely multifactorial in origin due to catheter placement while heparinized in combination with known history of radiation cystitis. He is already scheduled to be evaluated further with cystoscopy in the urology clinic Tuesday, August 1 at 3:45 PM. Neurology recommends continuing Foley for now as he had hydronephrosis and distended bladder before it was placed. - Due to hematuria, source needs to be determined given that  patient is going to be on anticoagulation for DVT and A. fib, discussed with urology regarding possibility of performing cystoscopy while on IV heparin infusion which can be briefly held. Discussed with [name],  ID who indicated that it is okay to proceed with cystoscopy since patient has received approximately 7 days of IV antibiotics. Updated [name], Urology  AKI on CKD 3 - AKI has resolved. Patient has baseline CKD.  - Baseline creatinine may be in the 1.9 range (May 2017). Presented with creatinine of 2.7. Has gradually improved to 2.29. Follow BMP in a.m.  DVT - See above. On Eliquis. - recently diagnosed in May 2017 and on Xarelto since - given renal failure continue heparin infusion. We'll switch to Eliquis once assure no hematuria after foley placement - there was concern in the ER regarding his breathing status and underwent a VQ scan which was low probability for PE.   DM - SSI  Hyperkalemia - resolved  OSA - continue CPAP   HTN - controlled.  Anemia -stable.  DVT prophylaxis: On Eliquis. Code Status: Full Family Communication: Sisters. Disposition Plan: Home with Home PT/Nursing when cleared by Urology.  Consultants:   Infectious disease  Urology  Procedures:   2-D echo - Left ventricle: The cavity size was normal. There was mild focal basal and moderate concentric hypertrophy of the septum. Systolic function was normal. The estimated ejection fraction was in the range of 60% to 65%. Wall motion was normal; there were no regional wall motion abnormalities. Mitral valve: Moderately calcified annulus. Left atrium: The atrium was moderately dilated.  Antimicrobials:  Ceftriaxone 7/14 >> 7/17   Vancomycin 7/15 >> 7/17  Ampicillin 7/17 >>  Subjective: Slight blood in urine reported. No fever or chills.  Objective: Vitals:   10/16/15 2110 10/16/15 2220 10/17/15 0453 10/17/15 1250  BP: (!) 149/58  (!) 158/79 (!) 155/65  Pulse: 74  75 66  Resp: 18 18 20 16   Temp: 98.4 F (36.9 C)  97.8 F (36.6 C) 97.9 F (36.6 C)  TempSrc: Oral  Oral Oral  SpO2: 97%  96% 96%  Weight:      Height:        Intake/Output Summary (Last 24 hours) at 10/17/15 1711 Last  data filed at 10/17/15 1657  Gross per 24 hour  Intake              300 ml  Output             3900 ml  Net            -3600 ml   Filed Weights   10/06/15 1811 10/07/15 0400  Weight: 108 kg (238 lb) 102.8 kg (226 lb 10.1 oz)    Gen: Pleasant elderly male lying comfortably supine in bed. Neck: normal, supple, no masses, no thyromegaly Respiratory: clear to auscultation bilaterally, no wheezing, no crackles. Normal respiratory effort. No accessory muscle use.  Cardiovascular: irregular, no murmurs / rubs / gallops. Trace bilateral leg edema. Telemetry: A. fib with controlled ventricular rate.. Abdomen: no tenderness. Bowel sounds positive. Foley catheter with pinkish discoloration of urine. Neurologic: non focal . Alert and oriented.   Data Reviewed: I have personally reviewed following labs and imaging studies  CBC:  Recent Labs Lab 10/12/15 0544 10/13/15 0458 10/15/15 1207 10/16/15 0458 10/17/15 0450  WBC 8.8 8.4 16.7* 10.0 11.2*  NEUTROABS  --   --   --  8.8*  --   HGB 10.9* 10.5* 11.1* 10.8* 10.6*  HCT 33.5* 32.0* 33.9* 31.7* 32.4*  MCV 93.1 93.6 93.4 96.4 94.2  PLT 273 292 293 222 0000000   Basic Metabolic Panel:  Recent Labs Lab 10/11/15 0531 10/13/15 0458 10/15/15 0507 10/17/15 0450  NA 141 143 141 141  K 3.8 3.7 4.6 4.3  CL 114* 115* 114* 115*  CO2 22 24 22 22   GLUCOSE 116* 114* 152* 98  BUN 45* 33* 26* 36*  CREATININE 2.29* 2.12* 1.91* 1.76*  CALCIUM 10.6* 10.8* 11.0* 11.0*  PHOS  --   --  4.1 2.4*   GFR: Estimated Creatinine Clearance: 41.5 mL/min (by C-G formula based on SCr of 1.76 mg/dL). Liver Function Tests:  Recent Labs Lab 10/15/15 0507 10/17/15 0450  ALBUMIN 2.8* 2.8*   No results for input(s): LIPASE, AMYLASE in the last 168 hours. No results for input(s): AMMONIA in the last 168 hours. Coagulation Profile: No results for input(s): INR, PROTIME in the last 168 hours. Cardiac Enzymes: No results for input(s): CKTOTAL, CKMB, CKMBINDEX,  TROPONINI in the last 168 hours. BNP (last 3 results) No results for input(s): PROBNP in the last 8760 hours. HbA1C: No results for input(s): HGBA1C in the last 72 hours. CBG:  Recent Labs Lab 10/16/15 1710 10/16/15 2124 10/17/15 0817 10/17/15 1153 10/17/15 1644  GLUCAP 119* 136* 123* 117* 118*   Lipid Profile: No results for input(s): CHOL, HDL, LDLCALC, TRIG, CHOLHDL, LDLDIRECT in the last 72 hours. Thyroid Function Tests: No results for input(s): TSH, T4TOTAL, FREET4, T3FREE, THYROIDAB in the last 72 hours. Anemia Panel: No results for input(s): VITAMINB12, FOLATE, FERRITIN, TIBC, IRON, RETICCTPCT in the last 72 hours. Urine analysis:    Component Value Date/Time   COLORURINE YELLOW 10/15/2015 1621   APPEARANCEUR CLOUDY (A) 10/15/2015 1621   LABSPEC 1.019 10/15/2015 1621   PHURINE 6.0 10/15/2015 1621   GLUCOSEU NEGATIVE 10/15/2015 1621   HGBUR LARGE (A) 10/15/2015 1621   BILIRUBINUR NEGATIVE 10/15/2015 1621   KETONESUR NEGATIVE 10/15/2015 1621   PROTEINUR 100 (A) 10/15/2015 1621   UROBILINOGEN 0.2 09/26/2012 1222   NITRITE NEGATIVE 10/15/2015 1621   LEUKOCYTESUR TRACE (A) 10/15/2015 1621   Sepsis Labs: Invalid input(s): PROCALCITONIN, LACTICIDVEN  Recent Results (from the past 240 hour(s))  Culture, blood (Routine X 2) w Reflex to ID Panel     Status: None   Collection Time: 10/09/15  3:38 PM  Result Value Ref Range Status   Specimen Description BLOOD LEFT HAND  Final   Special Requests BOTTLES DRAWN AEROBIC AND ANAEROBIC 5CC  Final   Culture   Final    NO GROWTH 5 DAYS Performed at [facility]    Report Status 10/14/2015 FINAL  Final  Surgical pcr screen     Status: None   Collection Time: 10/14/15 10:02 AM  Result Value Ref Range Status   MRSA, PCR NEGATIVE NEGATIVE Final   Staphylococcus aureus NEGATIVE NEGATIVE Final    Comment:        The Xpert SA Assay (FDA approved for NASAL specimens in patients over 52 years of age), is one component  of a comprehensive surveillance program.  Test performance has been validated by [facility] for patients greater than or equal to 25 year old. It is not intended to diagnose infection nor to guide or monitor treatment.   Culture, Urine     Status: None   Collection Time: 10/15/15  4:22 PM  Result Value Ref Range Status   Specimen Description URINE, CLEAN CATCH  Final   Special Requests  NONE  Final   Culture NO GROWTH Performed at [facility]   Final   Report Status 10/16/2015 FINAL  Final    Radiology Studies: No results found. Scheduled Meds: . amitriptyline  10 mg Oral QHS  . ampicillin (OMNIPEN) IV  2 g Intravenous Q6H  . apixaban  5 mg Oral BID  . calcium carbonate  1,250 mg Oral Q breakfast  . diltiazem  60 mg Oral Q6H  . gabapentin  100 mg Oral Daily  . insulin aspart  0-9 Units Subcutaneous TID WC  . lisinopril  40 mg Oral Daily  . QUEtiapine  25 mg Oral QHS  . tamsulosin  0.4 mg Oral Daily   Continuous Infusions: . sodium chloride 10 mL/hr at 10/11/15 0915     Bonnell Public, MD. [facility] Pager [phone].  If 7PM-7AM, please contact night-coverage [email or web address] Password [email or web address] 10/17/2015, 5:11 PM

## 2015-10-17 NOTE — Anesthesia Postprocedure Evaluation (Signed)
Anesthesia Post Note  Patient: Roy [name]  Procedure(s) Performed: Procedure(s) (LRB): CYSTOSCOPY WITH BILATERAL RETROGRADE PYELOGRAM/ FULGERATION WITH LITHOLAPAXY (Bilateral)  Patient location during evaluation: PACU Anesthesia Type: General Level of consciousness: awake, awake and alert and oriented Pain management: pain level controlled Vital Signs Assessment: post-procedure vital signs reviewed and stable Respiratory status: spontaneous breathing, nonlabored ventilation and respiratory function stable Cardiovascular status: blood pressure returned to baseline and stable Postop Assessment: no headache Anesthetic complications: no    Last Vitals:  Vitals:   10/17/15 0453 10/17/15 1250  BP: (!) 158/79 (!) 155/65  Pulse: 75 66  Resp: 20 16  Temp: 36.6 C 36.6 C    Last Pain:  Vitals:   10/17/15 1250  TempSrc: Oral  PainSc:                  Ivalee Strauser COKER

## 2015-10-18 DIAGNOSIS — G4733 Obstructive sleep apnea (adult) (pediatric): Secondary | ICD-10-CM

## 2015-10-18 LAB — CBC WITH DIFFERENTIAL/PLATELET
Basophils Absolute: 0 10*3/uL (ref 0.0–0.1)
Basophils Relative: 0 %
Eosinophils Absolute: 0.1 10*3/uL (ref 0.0–0.7)
Eosinophils Relative: 1 %
HCT: 32.7 % — ABNORMAL LOW (ref 39.0–52.0)
Hemoglobin: 10.7 g/dL — ABNORMAL LOW (ref 13.0–17.0)
Lymphocytes Relative: 17 %
Lymphs Abs: 1.7 10*3/uL (ref 0.7–4.0)
MCH: 30.7 pg (ref 26.0–34.0)
MCHC: 32.7 g/dL (ref 30.0–36.0)
MCV: 94 fL (ref 78.0–100.0)
Monocytes Absolute: 0.5 10*3/uL (ref 0.1–1.0)
Monocytes Relative: 5 %
Neutro Abs: 7.3 10*3/uL (ref 1.7–7.7)
Neutrophils Relative %: 77 %
Platelets: 261 10*3/uL (ref 150–400)
RBC: 3.48 MIL/uL — ABNORMAL LOW (ref 4.22–5.81)
RDW: 15 % (ref 11.5–15.5)
WBC: 9.5 10*3/uL (ref 4.0–10.5)

## 2015-10-18 LAB — GLUCOSE, CAPILLARY
GLUCOSE-CAPILLARY: 95 mg/dL (ref 65–99)
GLUCOSE-CAPILLARY: 95 mg/dL (ref 65–99)

## 2015-10-18 MED ORDER — APIXABAN 5 MG PO TABS: 5.0000 mg | ORAL_TABLET | Freq: Two times a day (BID) | ORAL | 1 refills | Status: DC

## 2015-10-18 MED ORDER — AMOXICILLIN 250 MG PO CAPS: 500.0000 mg | ORAL_CAPSULE | Freq: Three times a day (TID) | ORAL | Status: AC

## 2015-10-18 MED ORDER — TAMSULOSIN HCL 0.4 MG PO CAPS: 0.4000 mg | ORAL_CAPSULE | Freq: Every day | ORAL | 1 refills | Status: AC

## 2015-10-18 NOTE — Care Management Note (Signed)
Case Management Note  Patient Details  Name: Roy [name] MRN: [id number] Date of Birth: 12/19/1935  Subjective/Objective:  [facility] already [place] aware of d/c today, [facility] DMe rep [place]-aware of home rw order & d/c today to deliver to rm prior d/c. No further CM needs                Action/Plan:d/c home w/[facility]HC/dme.   Expected Discharge Date:                  Expected Discharge Plan:  [place]  In-House Referral:     Discharge planning Services  CM Consult  Post Acute Care Choice:    Choice offered to:  Patient  DME Arranged:  [place] rolling DME Agency:  [place]:  PT [facility] Agency:  [address]  Status of Service:  Completed, signed off  If discussed at [place] of Stay Meetings, dates discussed:    Additional Comments:  Dessa Phi, RN 10/18/2015, 12:18 PM

## 2015-10-18 NOTE — Discharge Summary (Signed)
Physician Discharge Summary  Roy [name] [id number] DOB: 04-Feb-1936 DOA: 10/06/2015  PCP: Rogers Blocker, MD  Admit date: 10/06/2015 Discharge date: 10/18/2015  Time spent: Greater than 30 minutes  Recommendations for Outpatient Follow-up:  1. Discharge patient home. 2. Home with Home health PT/Nursing 3. Follow up with PCP within one week. 4. Follow up with Urologist in 2 days at 1.15pm.    Discharge Diagnoses:  Principal Problem:   Sepsis secondary to UTI [facility])   Urinary retention   Bladder stones S/P cystoscopy  Active Problems:   DM2 (diabetes mellitus, type 2) (HCC)   Obstructive sleep apnea   Essential hypertension   CKD (chronic kidney disease) stage 3, GFR 30-59 ml/min   AKI (acute kidney injury) ([place])   UTI (lower urinary tract infection)   Bacteremia due to Enterococcus   Acute deep vein thrombosis (DVT) of femoral vein of left lower extremity ([place])   Discharge Condition: Optimized.  Diet recommendation: Cardiac/Diabetic diet  Filed Weights   10/06/15 1811 10/07/15 0400  Weight: 108 kg (238 lb) 102.8 kg (226 lb 10.1 oz)    History of present illness: 80 year old male with medical history significant for DM, HTN, OSA, recent lower extremity DVT in May on Xarelto, remote history of prostate cancer in 2006 with radiation, prolonged remission since then with normal PSA drawn last month according to the patient. Patient was found down at home. Associated weakness, fatigue, shortness of breath, wheezing and tachypnea. UA done on admission was suggestive of UTI, and blood culture grew enterococcus.  Hospital Course: Patient was admitted for further assessment and management. Patient was started on broad spectrum antibiotics, and was eventually changed to IV ampicillin following culture results. Patient will be discharged on oral amoxicillin to complete 2 week course of penicillin based antibiotics. Patient was also noted to be in urinary retention and Urology was  consulted. Indwelling Foley's catheter was placed and continued. Xarelto was changed to IV Heparin drip as cystoscopy was anticipated. Heparin was stopped prior to the patient undergoing cystoscopy with bilateral retrograde pyelogram, cystoscopy litholapaxy of multiple small stones/dystrophic calcification. Patient's oral anticoagulation was changed to Eliquis to impaired renal function. Post cystoscopy, patient went into another urinary retention and foley's catheter has been re-inserted. Patient will be discharged with indwelling foley's catheter, and patient will follow up with the Urologist in 2 days. Acute kidney injury noted on admission has resolved, and patient's renal function is back to it's baseline. There will be need to monitor patient's renal function.  During the hospital stay, it was communicated to the patient's son and sisters that the patient may not be safe living at home by himself. The patient and the patient's family have elected to take care of the home situation and subsequent care. Patient will be discharge with Home health Nursing and PT follow up.  Procedures:  Cystoscopy with bilateral retrograde pyelogram, cystoscopy litholapaxy of multiple small stones/dystrophic calcification   Consultations:  Urology  Discharge Exam: Vitals:   10/18/15 0530 10/18/15 0936  BP: (!) 161/70 (!) 165/74  Pulse: 77   Resp: 16   Temp: 98 F (36.7 C)     General: Not in distress. Awake and alert Cardiovascular: S1S2 Respiratory: Clear to auscultation  Discharge Instructions   Discharge Instructions    Diet - low sodium heart healthy    Complete by:  As directed   Diet Carb Modified    Complete by:  As directed   Discharge instructions    Complete  by:  As directed   Follow up with PCP within one week. Follow up with Urology in 2 days at 1.15pm   Increase activity slowly    Complete by:  As directed   As per Home PT. Home with Home Health PT/Nursing     Current Discharge  Medication List    START taking these medications   Details  apixaban (ELIQUIS) 5 MG TABS tablet Take 1 tablet (5 mg total) by mouth 2 (two) times daily. Qty: 60 tablet, Refills: 1    tamsulosin (FLOMAX) 0.4 MG CAPS capsule Take 1 capsule (0.4 mg total) by mouth daily. Qty: 30 capsule, Refills: 1      CONTINUE these medications which have NOT CHANGED   Details  albuterol (PROAIR HFA) 108 (90 BASE) MCG/ACT inhaler Inhale 2 puffs into the lungs 4 (four) times daily as needed.     amitriptyline (ELAVIL) 10 MG tablet Take 10 mg by mouth at bedtime.     Ascorbic Acid (VITAMIN C) 1000 MG tablet Take 1,000 mg by mouth daily.     calcium carbonate (OS-CAL) 600 MG TABS tablet Take 600 mg by mouth daily with breakfast.    diltiazem (CARDIZEM CD) 240 MG 24 hr capsule Take 240 mg by mouth daily.    gabapentin (NEURONTIN) 100 MG capsule Take 100 mg by mouth daily.    glimepiride (AMARYL) 4 MG tablet Take 4 mg by mouth daily with breakfast.     lisinopril (PRINIVIL,ZESTRIL) 40 MG tablet Take 40 mg by mouth daily.     pantoprazole (PROTONIX) 40 MG tablet Take 40 mg by mouth daily as needed (Acid reflux).     polyethylene glycol (MIRALAX / GLYCOLAX) packet Take 17 g by mouth daily as needed for moderate constipation.     QUEtiapine (SEROQUEL) 25 MG tablet Take 25 mg by mouth at bedtime.    sitaGLIPtin (JANUVIA) 100 MG tablet Take 100 mg by mouth daily.     vitamin B-12 (CYANOCOBALAMIN) 500 MCG tablet Take 500 mcg by mouth daily.    cyclobenzaprine (FLEXERIL) 5 MG tablet Take 5 mg by mouth as needed for muscle spasms.       STOP taking these medications     Ginkgo Biloba (GNP GINGKO BILOBA EXTRACT PO)      loratadine (CLARITIN) 10 MG tablet      Omega-3 Fatty Acids (FISH OIL) 1000 MG CAPS      potassium chloride SA (K-DUR,KLOR-CON) 20 MEQ tablet      rivaroxaban (XARELTO) 20 MG TABS tablet      torsemide (DEMADEX) 100 MG tablet        No Known Allergies Follow-up Information     ESKRIDGE, MATTHEW, MD Follow up on 10/19/2015.   Specialty:  Urology Why:  1:15 PM Contact information: [place] [address] [phone]        [facility].   Why:  rw Contact information: [address] [place] [address] [phone]            The results of significant diagnostics from this hospitalization (including imaging, microbiology, ancillary and laboratory) are listed below for reference.    Significant Diagnostic Studies: Dg Chest 2 View  Result Date: 10/06/2015 CLINICAL DATA:  Status post fall. EXAM: CHEST  2 VIEW COMPARISON:  CT chest 01/14/2006 FINDINGS: The heart size and mediastinal contours are within normal limits. Both lungs are clear. The visualized skeletal structures are unremarkable. IMPRESSION: No active cardiopulmonary disease. Electronically  Signed   By: Kathreen Devoid   On: 10/06/2015 15:40   Ct Head Wo Contrast  Result Date: 10/06/2015 CLINICAL DATA:  Several recent falls EXAM: CT HEAD WITHOUT CONTRAST TECHNIQUE: Contiguous axial images were obtained from the base of the skull through the vertex without intravenous contrast. COMPARISON:  June 02, 2014 FINDINGS: Brain: There is mild diffuse atrophy. There is no intracranial mass, hemorrhage, extra-axial fluid collection, or midline shift. There is mild small vessel disease in the centra semiovale bilaterally. Elsewhere gray-white compartments are normal. No acute infarct evident. Vascular: There is calcification in each cavernous carotid artery. No hyperdense vessels are apparent. Skull: The bony calvarium appears intact. Sinuses/Orbits: Orbits appear symmetric bilaterally. Visualized paranasal sinuses are clear. Other: Mastoid air cells are clear. IMPRESSION: Atrophy with mild periventricular small vessel disease. No intracranial mass, hemorrhage, or extra-axial fluid collection. No acute infarct evident. There are foci of arterial vascular calcification in the  cavernous carotid arteries. Electronically Signed   By: Lowella Grip III M.D.   On: 10/06/2015 16:19   US Renal  Result Date: 10/11/2015 CLINICAL DATA:  80 year old male with chronic renal disease, obstructive uropathy. Subsequent encounter. EXAM: RENAL / URINARY TRACT ULTRASOUND COMPLETE COMPARISON:  Recent renal ultrasounds including 10/10/2015. CT Abdomen and Pelvis 10/15/2011 FINDINGS: Right Kidney: Length: 15.5 cm. Mild right hydronephrosis has resolved since 10/07/2015. Simple renal cysts again noted. Left Kidney: Length: 12.6 cm. Mild to moderate left hydronephrosis has resolved since 10/07/2015. Simple left renal cysts re- demonstrated. Bladder: Shadowing bladder calculi. Diminutive bladder volume, Foley catheter balloon visible within the bladder. IMPRESSION: 1. Bilateral hydronephrosis has resolved since 10/07/2015. 2. Chronic bladder calculi. 3. Numerous simple bilateral renal cysts, inconsequential. Electronically Signed   By: Genevie Ann M.D.   On: 10/11/2015 13:55   US Renal  Result Date: 10/10/2015 CLINICAL DATA:  Question of hydronephrosis on recent prior ultrasound examination EXAM: RENAL / URINARY TRACT ULTRASOUND COMPLETE COMPARISON:  October 07, 2015 FINDINGS: Right Kidney: Length: 14.5 cm. Echogenicity and renal cortical thickness are within normal limits. There is no perinephric fluid. There is slight fullness of the right renal pelvis. There is no caliectasis seen. There is a focal cyst arising from the upper pole of the right kidney measuring 4 3.6 x 3.7 x 4.4 cm. A second cyst in the upper to mid portion of the right kidney measures 1.8 x 2.0 x 1.9 cm. No sonographically demonstrable calculus or ureterectasis. Left Kidney: Length: 12.9 cm. Echogenicity is within normal limits. Renal cortical thickness is low normal. No perinephric fluid visualized. There is no appreciable hydronephrosis on the left. There is a cyst arising from the mid to lower portion left kidney measuring 4.7 x 4.2 x  5.4 cm. There is a cyst in the periphery of the left mid kidney measuring 2.2 x 2.4 x 1.8 cm. A nearby cyst in this area measures 2.7 x 2.3 x 2.0 cm. No sonographically demonstrable calculus or ureterectasis. Bladder: There is a Foley catheter in the urinary bladder. In spite of the presence of the Foley catheter, there is noted to be significant debris within the urinary bladder. IMPRESSION: Suspect a degree of cystitis with debris seen in the urinary bladder despite presence of a Foley catheter. A small amount of urine remains in the urinary bladder currently. There are cysts arising from both kidneys. There is slight fullness of the right renal pelvis without caliectasis. There is left renal collecting system fullness currently compared to prior study. No new renal lesions are evident.  Electronically Signed   By: Lowella Grip III M.D.   On: 10/10/2015 14:38   US Renal  Result Date: 10/07/2015 CLINICAL DATA:  80 year old male with a history of acute kidney injury EXAM: RENAL / URINARY TRACT ULTRASOUND COMPLETE COMPARISON:  CT 10/15/2011 FINDINGS: Right Kidney: Length: 14.0 cm.  Moderate right-sided pelvicaliectasis. Anechoic structure on the superior cortex, measuring 3.3 cm x 3.4 cm x 3.3 cm. Posterior acoustic enhancement. Adjacent cystic lesion measures 2.2 cm x 2.0 cm x 2.1 cm. Posterior acoustic enhancement. Left Kidney: Length: 11.6 cm. Asymmetric cortical thinning of the left kidney compared to the right. Dilation of the collecting system, with mild pelvicaliectasis. Multilobulated anechoic cystic structure at the inferior left kidney measures 5.2 cm x 4.7 cm x 4.5 cm Additional anechoic cystic structure of the left kidney measures 2.0 cm x 2.4 cm x 1.9 cm Bladder: Urinary distention of the bladder. Right urinary jet identified. Debris within the dependent aspects of the urinary bladder, was present on comparison CT study. IMPRESSION: Right: Moderate right-sided pelvicaliectasis, which is  essentially persistent from the CT of 2013. A right ureteral jet is identified within the urinary bladder. Right-sided kidney cysts, most likely Bosniak 1 cysts. Left: Asymmetric cortical thinning of the left kidney, indicating chronic medical renal disease. This cortical thinning was present on the CT of 2013 when there was significant hydronephrosis/ pelvicaliectasis. Current ultrasound survey demonstrates persistent hydronephrosis/pelvicaliectasis. No left-sided ureteral jet identified on the current study. Bladder stones/debris at the dependent bladder, again unchanged from comparison CT. Cystic changes on the left may represent dilated calyx and/or Bosniak 1 cysts. Signed, Dulcy Fanny. Earleen Newport, DO [facility] Electronically Signed   By: Corrie Mckusick D.O.   On: 10/07/2015 08:31   Nm Pulmonary Perf And Vent  Result Date: 10/07/2015 CLINICAL DATA:  80 year old male with DVT in the left neck. No shortness of breath. No chest pain. EXAM: NUCLEAR MEDICINE VENTILATION - PERFUSION LUNG SCAN TECHNIQUE: Ventilation images were obtained in multiple projections using inhaled aerosol Tc-39m DTPA. Perfusion images were obtained in multiple projections after intravenous injection of Tc-33m MAA. RADIOPHARMACEUTICALS:  31 mCi Technetium-16m DTPA aerosol inhalation and 4.0 mCi Technetium-80m MAA IV COMPARISON:  Chest radiograph dated 01/06/2016 FINDINGS: Ventilation: There is heterogeneous uptake with areas of central deposition of the radiotracer compatible with COPD changes. There is a segmental area of defect in the superior segment of the left lower lobe. An area of defect is also noted in the right apical region. Perfusion: There is a segmental defect in the superior segment of the left lower lobe which appears matched to ventilation defect or smaller than the ventilation defect. No other perfusion defect identified. Overall there is better profusion of the lungs  compared to the ventilation images. IMPRESSION: Low probability for pulmonary embolism. Electronically Signed   By: Anner Crete M.D.   On: 10/07/2015 00:27   Dg Chest Port 1 View  Result Date: 10/07/2015 CLINICAL DATA:  80 year old male with a history of dyspnea EXAM: PORTABLE CHEST 1 VIEW COMPARISON:  10/06/2015, CT 01/14/2006 FINDINGS: Cardiomediastinal silhouette unchanged. No pneumothorax or pleural effusion. No confluent airspace disease. Linear opacities at the lung bases compatible of atelectasis. IMPRESSION: No radiographic evidence of acute cardiopulmonary disease Signed, Dulcy Fanny. Earleen Newport, DO [facility] Electronically Signed   By: Corrie Mckusick D.O.   On: 10/07/2015 15:58    Microbiology: Recent Results (from the past 240 hour(s))  Culture, blood (Routine X 2) w  Reflex to ID Panel     Status: None   Collection Time: 10/09/15  3:38 PM  Result Value Ref Range Status   Specimen Description BLOOD LEFT HAND  Final   Special Requests BOTTLES DRAWN AEROBIC AND ANAEROBIC 5CC  Final   Culture   Final    NO GROWTH 5 DAYS Performed at [facility]    Report Status 10/14/2015 FINAL  Final  Surgical pcr screen     Status: None   Collection Time: 10/14/15 10:02 AM  Result Value Ref Range Status   MRSA, PCR NEGATIVE NEGATIVE Final   Staphylococcus aureus NEGATIVE NEGATIVE Final    Comment:        The Xpert SA Assay (FDA approved for NASAL specimens in patients over 22 years of age), is one component of a comprehensive surveillance program.  Test performance has been validated by [facility] for patients greater than or equal to 56 year old. It is not intended to diagnose infection nor to guide or monitor treatment.   Culture, Urine     Status: None   Collection Time: 10/15/15  4:22 PM  Result Value Ref Range Status   Specimen Description URINE, CLEAN CATCH  Final   Special Requests NONE  Final   Culture NO  GROWTH Performed at [facility]   Final   Report Status 10/16/2015 FINAL  Final     Labs: Basic Metabolic Panel:  Recent Labs Lab 10/13/15 0458 10/15/15 0507 10/17/15 0450  NA 143 141 141  K 3.7 4.6 4.3  CL 115* 114* 115*  CO2 24 22 22   GLUCOSE 114* 152* 98  BUN 33* 26* 36*  CREATININE 2.12* 1.91* 1.76*  CALCIUM 10.8* 11.0* 11.0*  PHOS  --  4.1 2.4*   Liver Function Tests:  Recent Labs Lab 10/15/15 0507 10/17/15 0450  ALBUMIN 2.8* 2.8*   No results for input(s): LIPASE, AMYLASE in the last 168 hours. No results for input(s): AMMONIA in the last 168 hours. CBC:  Recent Labs Lab 10/13/15 0458 10/15/15 1207 10/16/15 0458 10/17/15 0450 10/18/15 0547  WBC 8.4 16.7* 10.0 11.2* 9.5  NEUTROABS  --   --  8.8*  --  7.3  HGB 10.5* 11.1* 10.8* 10.6* 10.7*  HCT 32.0* 33.9* 31.7* 32.4* 32.7*  MCV 93.6 93.4 96.4 94.2 94.0  PLT 292 293 222 296 261   Cardiac Enzymes: No results for input(s): CKTOTAL, CKMB, CKMBINDEX, TROPONINI in the last 168 hours. BNP: BNP (last 3 results) No results for input(s): BNP in the last 8760 hours.  ProBNP (last 3 results) No results for input(s): PROBNP in the last 8760 hours.  CBG:  Recent Labs Lab 10/17/15 1153 10/17/15 1644 10/17/15 2108 10/18/15 0731 10/18/15 1145  GLUCAP 117* 118* 108* 95 95       Signed:  Dana Allan, MD  [facility] Pager #: [phone] 7PM-7AM contact night coverage as above

## 2015-11-13 ENCOUNTER — Encounter: Payer: Self-pay | Admitting: Internal Medicine

## 2015-11-13 ENCOUNTER — Ambulatory Visit (INDEPENDENT_AMBULATORY_CARE_PROVIDER_SITE_OTHER): Payer: Medicare Other | Admitting: Internal Medicine

## 2015-11-13 VITALS — BP 128/62 | HR 67 | Ht 70.0 in | Wt 230.0 lb

## 2015-11-13 DIAGNOSIS — G4733 Obstructive sleep apnea (adult) (pediatric): Secondary | ICD-10-CM | POA: Diagnosis not present

## 2015-11-13 DIAGNOSIS — I82412 Acute embolism and thrombosis of left femoral vein: Secondary | ICD-10-CM | POA: Diagnosis not present

## 2015-11-13 DIAGNOSIS — J45909 Unspecified asthma, uncomplicated: Secondary | ICD-10-CM | POA: Diagnosis not present

## 2015-11-13 DIAGNOSIS — J449 Chronic obstructive pulmonary disease, unspecified: Secondary | ICD-10-CM | POA: Diagnosis not present

## 2015-11-13 NOTE — Progress Notes (Signed)
Subjective:    Patient ID: Roy [name], male    DOB: 03/07/1936, 80 y.o.   MRN: [id number]  HPI 10/09/10- 13 yoM followed for OSA, Asthma/ COPD    Wife here Last here March 13, 2010- note reviewed. He and his wife were unhappy with [employer] and are switching to [facility]. He has done well with BIPAP 17/4, with good control and compliance. He has not been able to lose weight.   10/14/11- 42 yoM followed for OSA, Asthma/ COPD    Wife here Pt states he is wearing BiPAP 7/4/ advanced nightly approx 8-9 hrs. Denies problems with pressure or mask. Pt states that [facility] has been "wonderful" since he switched companies! Adding a humidifier also made a big difference. Asthma symptoms have been well-controlled. No wheeze this summer and no need for his asthma medications.  11/06/12- 82 yoM former smoker followed for OSA, Asthma/ COPD    Wife here FOLLOWS FOR: wears BiPAP 7/4 / [facility] every night for about 8-9 hours; pressure working well for patient. He feels he is doing fine. Good compliance with no concerns.  Diet and has brought weight down. Denies wheezing or cough. His primary physician checks and occasional chest x-ray.  11/10/13- 61 yoM former smoker followed for OSA, Asthma/ COPD    Wife here FOLLOWS FOR: pt wearing BIPAP 17/4 8-9 hours every night, pt denies any problems with mask/pressure/supplies.  Pt has no other complaints at this time.   11/11/14- 57 yoM former smoker followed for OSA, Asthma/ COPD    Wife here FOLLOWS FOR: Pt states he is wearing his BIPAP 17/4  [facility] nightly for about 8-9 hours. Pt denies issues with mask, pressure, or machine.  He describes good compliance and control with no concerns.  11/13/2015-80 year old male former smoker followed for OSA, asthma/COPD BiPAP Insp 17, Exp 4, [facility]> today > 18/5 Follows for: OSA. Pt reports he uses CPAP nightly and occasionally when he naps. Pt reports that he sleeps wel.. Pt reports he takes naps on a few days a week. Pt  states that he is eligible for new CPAP machine in 10/17.  Hospitalized for syncopal episode in July associated with DVT and UTI. VQ scan low probability for PE. Feels fine now. On Eliquis. Says breathing well without inhalers in 3 or 4 years. Definitely sleeps better with his BiPAP. Download shows good compliance with 84%/4 hours. Suboptimal control with AHI 13.6/hour.  ROS-see HPI Constitutional:   + weight loss, night sweats, fevers, chills, fatigue, lassitude. HEENT:   No-  headaches, difficulty swallowing, tooth/dental problems, sore throat,       No-  sneezing, itching, ear ache, nasal congestion, post nasal drip,  CV:  No-   chest pain, orthopnea, PND, swelling in lower extremities, anasarca,  dizziness, palpitations Resp: No-   shortness of breath with exertion or at rest.              No-   productive cough,  No non-productive cough,  No- coughing up of blood.              No-   change in color of mucus.  No- wheezing.   Skin: No-   rash or lesions. GI:  No-   heartburn, indigestion, abdominal pain, nausea, vomiting,  GU:  MS:  No-   joint pain or swelling.   Neuro-     nothing unusual Psych:  No- change in mood or affect. No depression or anxiety.  No memory loss.  Objective:  Physical Exam General- Alert, Oriented, Affect-appropriate, Distress- none acute, + obese Skin- rash-none, lesions- none, excoriation- none Lymphadenopathy- none Head- atraumatic            Eyes- Gross vision intact, PERRLA, conjunctivae clear secretions            Ears- Hearing, canals            Nose- Clear, No- Septal dev, mucus, polyps, erosion, perforation             Throat- Mallampati III , mucosa clear , drainage- none, tonsils- present.  + Dental repair Neck- flexible , trachea midline, no stridor , thyroid nl, carotid no bruit Chest - symmetrical excursion , unlabored           Heart/CV- RRR , no murmur , no gallop  , no rub, nl s1 s2                           - JVD- none , edema- none,  stasis changes- none, varices- none           Lung- clear to P&A, wheeze- none, cough- none , dullness-none, rub- none           Chest wall-  Abd-  Br/ Gen/ Rectal- Not done, not indicated Extrem- cyanosis- none, clubbing, none, atrophy- none, strength- nl, + cane Neuro- grossly intact to observation Assessment & Plan:

## 2015-11-13 NOTE — Patient Instructions (Signed)
Order- [facility]- please increase BIPAP to 18/ 5, continue mask of choice, supplies, humidifier, AirView. Please evaluate eligibility for replacement of old machine.      Dx OSA  Since your breathing has been good and you haven't needed your inhaler in years, I have taken it off your list.  Please call as needed

## 2015-12-04 NOTE — Assessment & Plan Note (Signed)
VQ scan did not show evidence of. He is on Eliquis.

## 2015-12-04 NOTE — Assessment & Plan Note (Signed)
He agreed to a small increase in inspiratory pressure to see if that helps his control on download. Plan-increase BiPAP to 18/5

## 2015-12-04 NOTE — Assessment & Plan Note (Signed)
He has not needed rescue inhaler and says his breathing is "fine" with little cough and no wheeze.

## 2015-12-11 ENCOUNTER — Encounter: Payer: Self-pay | Admitting: Internal Medicine

## 2016-01-11 DIAGNOSIS — H6002 Abscess of left external ear: Secondary | ICD-10-CM

## 2016-01-11 HISTORY — DX: Abscess of left external ear: H60.02

## 2016-01-25 DIAGNOSIS — L089 Local infection of the skin and subcutaneous tissue, unspecified: Secondary | ICD-10-CM | POA: Insufficient documentation

## 2016-01-25 DIAGNOSIS — L729 Follicular cyst of the skin and subcutaneous tissue, unspecified: Secondary | ICD-10-CM

## 2016-01-25 HISTORY — DX: Local infection of the skin and subcutaneous tissue, unspecified: L08.9

## 2016-02-09 ENCOUNTER — Ambulatory Visit: Payer: Self-pay | Admitting: Otolaryngology

## 2016-02-09 NOTE — H&P (Signed)
HPI:   Roy [name] is a 80 y.o. male who presents as a consult.  Referring Provider: Juliet Rude, MD  Chief complaint: left ear abscess.  HPI: This is a pleasant 81 year old male who presents for one week of swelling and pain behind his left ear. He states that for years there has been a small bump behind this ear that he could periodically express some thick pus. It never has been infected, however last week after squeezing on it, the bump got bigger and more painful. Patient was started on Cefdinir three days ago. Patient cannot tell if the swelling has gone down. No fever. It has not been draining in recent days.  Patient is on Eliquis since June of this year for a lower extremity blood clot; prior to this was on other anti-coagulant therapy. He has type II diabetes; blood sugars are well maintained. Also uses CPAP.  PMH/Meds/All/SocHx/FamHx/ROS:   No past medical history on file.  No past surgical history on file.  No family history of bleeding disorders, wound healing problems or difficulty with anesthesia.   Social History   Social History  . Marital status: N/A  Spouse name: N/A  . Number of children: N/A  . Years of education: N/A   Occupational History  . Not on file.   Social History Main Topics  . Smoking status: Not on file  . Smokeless tobacco: Not on file  . Alcohol use Not on file  . Drug use: Not on file  . Sexual activity: Not on file   Other Topics Concern  . Not on file   Social History Narrative   Current Outpatient Prescriptions:  . amitriptyline (ELAVIL) 10 MG tablet, Take 10 mg by mouth nightly., Disp: , Rfl:  . ascorbic acid, vitamin C, (VITAMIN C) 1000 MG tablet, Take 1,000 mg by mouth daily., Disp: , Rfl:  . cyanocobalamin (VITAMIN B-12) 500 MCG tablet, Take 500 mcg by mouth daily., Disp: , Rfl:  . cyclobenzaprine (FLEXERIL) 5 MG tablet, Take 5 mg by mouth 3 (three) times daily as needed for Muscle spasms., Disp: , Rfl:  . diltiazem  (DILACOR XR) 240 MG 24 hr capsule, Take 240 mg by mouth daily., Disp: , Rfl:  . ginkgo biloba 40 mg Tab, Take by mouth., Disp: , Rfl:  . glimepiride (AMARYL) 4 MG tablet, Take 4 mg by mouth daily before breakfast., Disp: , Rfl:  . lisinopril (PRINIVIL,ZESTRIL) 40 MG tablet, Take 40 mg by mouth daily., Disp: , Rfl:  . loratadine (CLARITIN) 10 mg tablet, Take 10 mg by mouth daily., Disp: , Rfl:  . OMEGA-3/DHA/EPA/FISH OIL (OMEGA-3 FATTY ACIDS-FISH OIL) 300-1,000 mg capsule, Take 1 g by mouth daily., Disp: , Rfl:  . pantoprazole (PROTONIX) 40 MG tablet, Take 40 mg by mouth daily., Disp: , Rfl:  . potassium chloride ER (KLOR-CON-M, K-DUR) 20 MEQ extended release tablet, Take 20 mEq by mouth 2 times daily., Disp: , Rfl:  . QUEtiapine (SEROQUEL) 25 MG tablet, Take 25 mg by mouth nightly., Disp: , Rfl:  . sitaGLIPtin (JANUVIA) 100 MG tablet, Take 100 mg by mouth daily., Disp: , Rfl:  . torsemide (DEMADEX) 100 MG tablet, Take 100 mg by mouth daily., Disp: , Rfl:  . gabapentin (NEURONTIN) 100 MG capsule, Take 100 mg by mouth 3 times daily., Disp: , Rfl:   A complete ROS was performed with pertinent positives/negatives noted in the HPI. The remainder of the ROS are negative.   Physical Exam:   BP 141/66  Ht 1.791 m (5' 10.5")  Wt 104.3 kg (230 lb)  BMI 32.54 kg/m2  General: Healthy and alert, in no distress, breathing easily. Normal affect. In a pleasant mood. Head: Normocephalic, atraumatic. No masses, or scars. Eyes: Pupils are equal, and reactive to light. Vision is grossly intact. No spontaneous or gaze nystagmus. Ears: Left posterior auricular sulcus with 3cm fluctuant and tender abscess and central scab. No active discharge.  Ear canals are clear. Tympanic membranes are intact, with normal landmarks and the middle ears are clear and healthy. Hearing: Grossly normal. Nose: Nasal cavities are clear with healthy mucosa, no polyps or exudate.Airways are patent. Face: No masses or scars, facial  nerve function is symmetric. Oral Cavity: No mucosal abnormalities are noted. Tongue with normal mobility. Denture upper, dental plate lower. Oropharynx: Tonsils are symmetric. There are no mucosal masses identified. Tongue base appears normal and healthy. Neck: No palpable masses, no cervical adenopathy, no thyroid nodules or enlargement. Neuro: Cranial nerves II-XII will normal function. Balance: Normal gate.  Independent Review of Additional Tests or Records:  none  Procedures:  none  Impression & Plans:   This is a 80 year old male with left post-auricular abscess or infected sebaceous cyst. Given his medical history and current use of Eliquis, would recommend switching to Clindamycin 300mg  TID for 10 days before considering I&D. He would need to be off this medication a few days prior to any procedure and we would like to try and avoid this. We will see him back in four days for recheck. Patient agrees with the plan.

## 2016-02-21 ENCOUNTER — Encounter (HOSPITAL_COMMUNITY): Payer: Self-pay

## 2016-02-21 ENCOUNTER — Encounter (HOSPITAL_COMMUNITY)
Admission: RE | Admit: 2016-02-21 | Discharge: 2016-02-21 | Disposition: A | Discharge status: Home / Self Care | Payer: Medicare Other | Source: Ambulatory Visit | Attending: Otolaryngology | Admitting: Otolaryngology

## 2016-02-21 DIAGNOSIS — Z86718 Personal history of other venous thrombosis and embolism: Secondary | ICD-10-CM | POA: Diagnosis not present

## 2016-02-21 DIAGNOSIS — E119 Type 2 diabetes mellitus without complications: Secondary | ICD-10-CM | POA: Diagnosis not present

## 2016-02-21 DIAGNOSIS — L720 Epidermal cyst: Secondary | ICD-10-CM | POA: Diagnosis not present

## 2016-02-21 DIAGNOSIS — Z79899 Other long term (current) drug therapy: Secondary | ICD-10-CM | POA: Diagnosis not present

## 2016-02-21 DIAGNOSIS — Z7901 Long term (current) use of anticoagulants: Secondary | ICD-10-CM | POA: Diagnosis not present

## 2016-02-21 HISTORY — DX: Presence of spectacles and contact lenses: Z97.3

## 2016-02-21 HISTORY — DX: Sepsis, unspecified organism: A41.9

## 2016-02-21 HISTORY — DX: Acute embolism and thrombosis of unspecified deep veins of unspecified lower extremity: I82.409

## 2016-02-21 HISTORY — DX: Unspecified atrial fibrillation: I48.91

## 2016-02-21 HISTORY — DX: Preauricular sinus and cyst: Q18.1

## 2016-02-21 LAB — BASIC METABOLIC PANEL
Anion gap: 7 (ref 5–15)
BUN: 22 mg/dL — AB (ref 6–20)
CHLORIDE: 112 mmol/L — AB (ref 101–111)
CO2: 21 mmol/L — ABNORMAL LOW (ref 22–32)
Calcium: 10.8 mg/dL — ABNORMAL HIGH (ref 8.9–10.3)
Creatinine, Ser: 1.79 mg/dL — ABNORMAL HIGH (ref 0.61–1.24)
GFR calc Af Amer: 40 mL/min — ABNORMAL LOW (ref >60)
GFR calc non Af Amer: 35 mL/min — ABNORMAL LOW (ref >60)
GLUCOSE: 70 mg/dL (ref 65–99)
POTASSIUM: 4.2 mmol/L (ref 3.5–5.1)
Sodium: 140 mmol/L (ref 135–145)

## 2016-02-21 LAB — CBC
HEMATOCRIT: 36.2 % — AB (ref 39.0–52.0)
Hemoglobin: 11.8 g/dL — ABNORMAL LOW (ref 13.0–17.0)
MCH: 29.7 pg (ref 26.0–34.0)
MCHC: 32.6 g/dL (ref 30.0–36.0)
MCV: 91.2 fL (ref 78.0–100.0)
Platelets: 167 10*3/uL (ref 150–400)
RBC: 3.97 MIL/uL — ABNORMAL LOW (ref 4.22–5.81)
RDW: 14.4 % (ref 11.5–15.5)
WBC: 5.8 10*3/uL (ref 4.0–10.5)

## 2016-02-21 LAB — GLUCOSE, CAPILLARY: Glucose-Capillary: 77 mg/dL (ref 65–99)

## 2016-02-21 NOTE — Progress Notes (Addendum)
Anesthesia Note: Patient is a 80 year old male scheduled for excision of left ear cyst on 02/23/16 by Dr. Constance Holster.  History includes DM2, OSA (CPAP), former smoker (quit '00), HTN, GERD, prostate cancer s/p radiation '10, COPD, left popliteal and femoral vein DVT 08/14/15. He was admitted 10/06/15-10/18/15 with bladder stones, urinary retention, AKI, UTI with bacteremia (Enterococcus). He also developed afib with RVR believed secondary to sepsis. He underwent cystoscopy litholapaxy of multiple small stoned on 10/14/15. He was discharged home on Eliquis (instead of Xarelto due to his impaired renal function).  PCP is listed as Dr. Kevan Ny. Pulmonologist is Dr. Baird Lyons.   Meds include Elavil, Eliquis, Cardizem CD, Neurontin, Amaryl, lisinopril, Protonix, Seroquel, Januvia, Flomax. Eliquis held 02/18/16 (was held sooner than needed for this surgery because it also had to be held for dental procedure done a few days ago).   BP (!) 177/62   Pulse 74   Temp 36.4 C   Resp 20   Ht 5' 10.5" (1.791 m)   Wt 230 lb 6.4 oz (104.5 kg)   SpO2 95%   BMI 32.59 kg/m   02/21/16 EKG: NSR, LAD, minimal voltage criteria for LVH, may be normal variant. SR has replaced afib with RVR since 10/08/15.  10/11/15 TEE: Study Conclusions - Left ventricle: Systolic function was normal. The estimated   ejection fraction was in the range of 55% to 60%. Wall motion was   normal; there were no regional wall motion abnormalities. - Aortic valve: No evidence of vegetation. There was trivial   regurgitation. - Mitral valve: No evidence of vegetation. - Left atrium: No evidence of thrombus in the atrial cavity or   appendage. There was mildcontinuous spontaneous echo contrast   (&quot;smoke&quot;) in the cavity and the appendage. - Right atrium: No evidence of thrombus in the atrial cavity or   appendage. - Atrial septum: No defect or patent foramen ovale was identified. - Tricuspid valve: No evidence of vegetation. -  Pulmonic valve: No evidence of vegetation.  10/08/15 TTE: Study Conclusions - Left ventricle: The cavity size was normal. There was mild focal   basal and moderate concentric hypertrophy of the septum. Systolic   function was normal. The estimated ejection fraction was in the   range of 60% to 65%. Wall motion was normal; there were no   regional wall motion abnormalities. - Mitral valve: Moderately calcified annulus. - Left atrium: The atrium was moderately dilated.  Preoperative labs noted. Na 140, K 4.2, Cl 112 (previously 114-115 09/2015), Cr 1.79, consistent with labs since 09/2015. H/H 11.8/36.2. Glucose 70.   Patient with afib with RVR in 09/2015 in the setting of sepsis, echo showed normal wall motion. He has since tolerated a urological surgery. EKG done today to confirm that he is back in SR. If no acute changes then I anticipate that he can proceed as planned. Reviewed with anesthesiologist Dr. Kalman Shan who agrees with this plan.  George Hugh [facility] Short Stay Center/Anesthesiology Phone [phone] 02/21/2016 2:29 PM

## 2016-02-21 NOTE — Progress Notes (Signed)
Pt denies SOB, chest pain, and being under the care of a cardiologist. Pt denies having a cardiac cath but stated that a stress test was performed > 20 years ago. Pt denies having an EKG. Pt stated that an A1c was done at [facility]; records requested.[facility] replied stating that last A1c was August 2017. Pt stated that Eliquis was stopped on 02/18/16 due to a dental procedure. Pt stated that fasting blood glucose ranges between 85-120. Pt chart forwarded to anesthesia for review  (see note).

## 2016-02-21 NOTE — Pre-Procedure Instructions (Signed)
BODEN STUCKY  02/21/2016      [facility] [place], [place] - [address] [place] [address] Phone: [phone] Fax: [phone]    Your procedure is scheduled on Friday, February 23, 2016  Report to [facility] Admitting at 9:45 A.M.  Call this number if you have problems the morning of surgery:  [phone]   Remember:  Do not eat food or drink liquids after midnight Thursday, February 22, 2016  Take these medicines the morning of surgery with A SIP OF WATER :diltiazem (CARDIZEM CD)finasteride (PROSCAR), gabapentin (NEURONTIN), pantoprazole (PROTONIX), tamsulosin (FLOMAX)   Stop taking Aspirin, vitamins, fish oil and herbal medications. Do not take any NSAIDs ie: Ibuprofen, Advil, Naproxen, BC and Goody Powder or any medication containing Aspirin; stop now.    How to Manage Your Diabetes Before and After Surgery  Why is it important to control my blood sugar before and after surgery? . Improving blood sugar levels before and after surgery helps healing and can limit problems. . A way of improving blood sugar control is eating a healthy diet by: o  Eating less sugar and carbohydrates o  Increasing activity/exercise o  Talking with your doctor about reaching your blood sugar goals . High blood sugars (greater than 180 mg/dL) can raise your risk of infections and slow your recovery, so you will need to focus on controlling your diabetes during the weeks before surgery. . Make sure that the doctor who takes care of your diabetes knows about your planned surgery including the date and location.  How do I manage my blood sugar before surgery? . Check your blood sugar at least 4 times a day, starting 2 days before surgery, to make sure that the level is not too high or low. o Check your blood sugar the morning of your surgery when you wake up and every 2 hours until you get to the Short Stay unit. . If your blood  sugar is less than 70 mg/dL, you will need to treat for low blood sugar: o Do not take insulin. o Treat a low blood sugar (less than 70 mg/dL) with  cup of clear juice (cranberry or apple), 4 glucose tablets, OR glucose gel. o Recheck blood sugar in 15 minutes after treatment (to make sure it is greater than 70 mg/dL). If your blood sugar is not greater than 70 mg/dL on recheck, call [phone] for further instructions. . Report your blood sugar to the short stay nurse when you get to Short Stay.  . If you are admitted to the hospital after surgery: o Your blood sugar will be checked by the staff and you will probably be given insulin after surgery (instead of oral diabetes medicines) to make sure you have good blood sugar levels. o The goal for blood sugar control after surgery is 80-180 mg/dL.  WHAT DO I DO ABOUT MY DIABETES MEDICATION?   [name] Do not take oral diabetes medicines (pills) the morning of surgery such as glimepiride (AMARYL), sitaGLIPtin (JANUVIA)    Other Instructions: Do not take glimepiride (AMARYL) the night before surgery.    Patient Signature:  Date:   Nurse Signature:  Date:   Reviewed and Endorsed by [facility] Patient Education Committee, August 2015  Do not wear jewelry, make-up or nail polish.  Do not wear lotions, powders, or perfumes, or deoderant.  Do not shave 48 hours prior to surgery.  Men may shave face and  neck.  Do not bring valuables to the hospital.  [facility] is not responsible for any belongings or valuables.  Contacts, dentures or bridgework may not be worn into surgery.  Leave your suitcase in the car.  After surgery it may be brought to your room.  For patients admitted to the hospital, discharge time will be determined by your treatment team.  Patients discharged the day of surgery will not be allowed to drive home.   Special instructions: Shower the night before surgery and the morning of surgery with CHG.  Please read over the  following fact sheets that you were given. Pain Booklet, Coughing and Deep Breathing and Surgical Site Infection Prevention

## 2016-02-22 LAB — HEMOGLOBIN A1C
HEMOGLOBIN A1C: 5.6 % (ref 4.8–5.6)
MEAN PLASMA GLUCOSE: 114 mg/dL

## 2016-02-23 ENCOUNTER — Ambulatory Visit (HOSPITAL_COMMUNITY)
Admission: RE | Admit: 2016-02-23 | Discharge: 2016-02-23 | Disposition: A | Discharge status: Home / Self Care | Payer: Medicare Other | Source: Ambulatory Visit | Attending: Otolaryngology | Admitting: Otolaryngology

## 2016-02-23 ENCOUNTER — Encounter (HOSPITAL_COMMUNITY)
Admission: RE | Disposition: A | Discharge status: Home / Self Care | Payer: Self-pay | Source: Ambulatory Visit | Attending: Otolaryngology

## 2016-02-23 ENCOUNTER — Ambulatory Visit (HOSPITAL_COMMUNITY): Payer: Medicare Other | Admitting: Certified Registered Nurse Anesthetist

## 2016-02-23 ENCOUNTER — Ambulatory Visit (HOSPITAL_COMMUNITY): Payer: Medicare Other | Admitting: Vascular Surgery

## 2016-02-23 ENCOUNTER — Encounter (HOSPITAL_COMMUNITY): Payer: Self-pay | Admitting: Certified Registered Nurse Anesthetist

## 2016-02-23 DIAGNOSIS — Z86718 Personal history of other venous thrombosis and embolism: Secondary | ICD-10-CM | POA: Diagnosis not present

## 2016-02-23 DIAGNOSIS — L720 Epidermal cyst: Secondary | ICD-10-CM | POA: Insufficient documentation

## 2016-02-23 DIAGNOSIS — Z7901 Long term (current) use of anticoagulants: Secondary | ICD-10-CM | POA: Insufficient documentation

## 2016-02-23 DIAGNOSIS — Z79899 Other long term (current) drug therapy: Secondary | ICD-10-CM | POA: Insufficient documentation

## 2016-02-23 DIAGNOSIS — E119 Type 2 diabetes mellitus without complications: Secondary | ICD-10-CM | POA: Insufficient documentation

## 2016-02-23 HISTORY — PX: EAR CYST EXCISION: SHX22

## 2016-02-23 LAB — GLUCOSE, CAPILLARY
GLUCOSE-CAPILLARY: 90 mg/dL (ref 65–99)
Glucose-Capillary: 112 mg/dL — ABNORMAL HIGH (ref 65–99)

## 2016-02-23 SURGERY — EXCISION, CYST, EAR
Anesthesia: Monitor Anesthesia Care | Site: Face | Laterality: Left

## 2016-02-23 MED ORDER — OXYMETAZOLINE HCL 0.05 % NA SOLN
NASAL | Status: DC | PRN
  Administered 2016-02-23: 2 via NASAL

## 2016-02-23 MED ORDER — LIDOCAINE-EPINEPHRINE (PF) 1 %-1:200000 IJ SOLN
INTRAMUSCULAR | Status: AC
  Filled 2016-02-23: qty 30

## 2016-02-23 MED ORDER — LACTATED RINGERS IV SOLN
INTRAVENOUS | Status: DC
  Administered 2016-02-23: 09:00:00 via INTRAVENOUS

## 2016-02-23 MED ORDER — CLINDAMYCIN PHOSPHATE 900 MG/50ML IV SOLN
900.0000 mg | INTRAVENOUS | Status: AC
  Administered 2016-02-23: 900 mg via INTRAVENOUS
  Filled 2016-02-23: qty 50

## 2016-02-23 MED ORDER — CEPHALEXIN 500 MG PO CAPS: 500.0000 mg | ORAL_CAPSULE | Freq: Three times a day (TID) | ORAL | 0 refills | Status: DC

## 2016-02-23 MED ORDER — LIDOCAINE-EPINEPHRINE 1 %-1:100000 IJ SOLN
INTRAMUSCULAR | Status: DC | PRN
  Administered 2016-02-23: 10 mL

## 2016-02-23 MED ORDER — FENTANYL CITRATE (PF) 100 MCG/2ML IJ SOLN
INTRAMUSCULAR | Status: AC
  Filled 2016-02-23: qty 2

## 2016-02-23 MED ORDER — 0.9 % SODIUM CHLORIDE (POUR BTL) OPTIME
TOPICAL | Status: DC | PRN
  Administered 2016-02-23: 1000 mL

## 2016-02-23 MED ORDER — CIPROFLOXACIN-DEXAMETHASONE 0.3-0.1 % OT SUSP
OTIC | Status: AC
  Filled 2016-02-23: qty 7.5

## 2016-02-23 MED ORDER — PROPOFOL 500 MG/50ML IV EMUL
INTRAVENOUS | Status: DC | PRN
  Administered 2016-02-23: 25 ug/kg/min via INTRAVENOUS

## 2016-02-23 MED ORDER — PROPOFOL 10 MG/ML IV BOLUS
INTRAVENOUS | Status: AC
  Filled 2016-02-23: qty 20

## 2016-02-23 SURGICAL SUPPLY — 28 items
ADH SKN CLS APL DERMABOND .7 (GAUZE/BANDAGES/DRESSINGS) ×1
BALL CTTN LRG ABS STRL LF (GAUZE/BANDAGES/DRESSINGS) ×1
BLADE SURG 15 STRL LF DISP TIS (BLADE) ×1 IMPLANT
BLADE SURG 15 STRL SS (BLADE) ×3
CANISTER SUCTION 2500CC (MISCELLANEOUS) ×3 IMPLANT
CORDS BIPOLAR (ELECTRODE) ×3 IMPLANT
COTTONBALL LRG STERILE PKG (GAUZE/BANDAGES/DRESSINGS) ×3 IMPLANT
COVER SURGICAL LIGHT HANDLE (MISCELLANEOUS) ×3 IMPLANT
DERMABOND ADVANCED (GAUZE/BANDAGES/DRESSINGS) ×2
DERMABOND ADVANCED .7 DNX12 (GAUZE/BANDAGES/DRESSINGS) ×1 IMPLANT
DRAPE MICROSCOPE LEICA 54X105 (DRAPE) ×3 IMPLANT
DRAPE PROXIMA HALF (DRAPES) IMPLANT
GLOVE ECLIPSE 7.5 STRL STRAW (GLOVE) ×6 IMPLANT
GOWN STRL REUS W/ TWL LRG LVL3 (GOWN DISPOSABLE) ×1 IMPLANT
GOWN STRL REUS W/ TWL XL LVL3 (GOWN DISPOSABLE) ×1 IMPLANT
GOWN STRL REUS W/TWL LRG LVL3 (GOWN DISPOSABLE) ×3
GOWN STRL REUS W/TWL XL LVL3 (GOWN DISPOSABLE) ×3
KIT BASIN OR (CUSTOM PROCEDURE TRAY) ×3 IMPLANT
KIT ROOM TURNOVER OR (KITS) ×3 IMPLANT
NEEDLE PRECISIONGLIDE 27X1.5 (NEEDLE) ×3 IMPLANT
PAD ARMBOARD 7.5X6 YLW CONV (MISCELLANEOUS) ×6 IMPLANT
PENCIL FOOT CONTROL (ELECTRODE) ×3 IMPLANT
STAPLER VISISTAT 35W (STAPLE) ×3 IMPLANT
SUT CHROMIC 3 0 PS 2 (SUTURE) ×3 IMPLANT
TOWEL OR 17X24 6PK STRL BLUE (TOWEL DISPOSABLE) ×3 IMPLANT
TRAY ENT MC OR (CUSTOM PROCEDURE TRAY) ×3 IMPLANT
TUBE CONNECTING 12'X1/4 (SUCTIONS) ×1
TUBE CONNECTING 12X1/4 (SUCTIONS) ×2 IMPLANT

## 2016-02-23 NOTE — Anesthesia Preprocedure Evaluation (Addendum)
Anesthesia Evaluation  Patient identified by MRN, date of birth, ID band Patient awake    Reviewed: Allergy & Precautions, H&P , Patient's Chart, lab work & pertinent test results, reviewed documented beta blocker date and time   Airway Mallampati: III  TM Distance: >3 FB Neck ROM: full    Dental no notable dental hx. (+) Poor Dentition, Chipped   Pulmonary asthma , sleep apnea and Continuous Positive Airway Pressure Ventilation , former smoker,    Pulmonary exam normal breath sounds clear to auscultation       Cardiovascular hypertension,  Rhythm:regular Rate:Normal     Neuro/Psych    GI/Hepatic   Endo/Other  diabetes  Renal/GU      Musculoskeletal   Abdominal   Peds  Hematology   Anesthesia Other Findings Diabetes mellitus    Hypertension Good LV fxn  GERD     Dyspnea on exertion   Chronic obstructive asthma  COPD   DVT (deep venous thrombosis) (HCC)   OSA on CPAP .........Marland Kitchenwears CPAP   A-fib ([place])   Cyst on ear      Reproductive/Obstetrics                            Anesthesia Physical Anesthesia Plan  ASA: III  Anesthesia Plan: MAC   Post-op Pain Management:    Induction: Intravenous  Airway Management Planned: Mask and Natural Airway  Additional Equipment:   Intra-op Plan:   Post-operative Plan:   Informed Consent: I have reviewed the patients History and Physical, chart, labs and discussed the procedure including the risks, benefits and alternatives for the proposed anesthesia with the patient or authorized representative who has indicated his/her understanding and acceptance.   Dental Advisory Given  Plan Discussed with: CRNA and Surgeon  Anesthesia Plan Comments: (Discussed sedation and potential to need to place airway or ETT if warranted by clinical changes intra-operatively. We will start procedure as MAC.)        Anesthesia Quick Evaluation

## 2016-02-23 NOTE — Discharge Instructions (Signed)
You may shower and use soap and water. Do not use any creams, oils or ointment. ° °

## 2016-02-23 NOTE — Anesthesia Procedure Notes (Signed)
Date/Time: 02/23/2016 11:03 AM Performed by: Candis Shine Pre-anesthesia Checklist: Patient identified, Emergency Drugs available, Suction available, Patient being monitored and Timeout performed Patient Re-evaluated:Patient Re-evaluated prior to inductionOxygen Delivery Method: Nasal cannula Dental Injury: Teeth and Oropharynx as per pre-operative assessment

## 2016-02-23 NOTE — Transfer of Care (Signed)
Immediate Anesthesia Transfer of Care Note  Patient: Roy [name]  Procedure(s) Performed: Procedure(s): EXCISION LEFT EAR CYST (Left)  Patient Location: PACU  Anesthesia Type:MAC  Level of Consciousness: awake, alert  and oriented  Airway & Oxygen Therapy: Patient Spontanous Breathing  Post-op Assessment: Report given to RN and Post -op Vital signs reviewed and stable  Post vital signs: Reviewed and stable  Last Vitals:  Vitals:   02/23/16 0853 02/23/16 1202  BP: (!) 195/87 (!) 177/93  Pulse: 82 78  Resp: 18   Temp: 36.8 C     Last Pain:  Vitals:   02/23/16 0853  TempSrc: Oral      Patients Stated Pain Goal: 3 (78/00/44 7158)  Complications: No apparent anesthesia complications

## 2016-02-23 NOTE — Anesthesia Postprocedure Evaluation (Signed)
Anesthesia Post Note  Patient: Roy [name]  Procedure(s) Performed: Procedure(s) (LRB): EXCISION LEFT EAR CYST (Left)  Patient location during evaluation: PACU Anesthesia Type: MAC Level of consciousness: awake and alert Pain management: pain level controlled Vital Signs Assessment: post-procedure vital signs reviewed and stable Respiratory status: spontaneous breathing, nonlabored ventilation and respiratory function stable Cardiovascular status: stable and blood pressure returned to baseline Anesthetic complications: no    Last Vitals:  Vitals:   02/23/16 1202 02/23/16 1214  BP: (!) 177/93 133/77  Pulse: 78 72  Resp:    Temp:      Last Pain:  Vitals:   02/23/16 0853  TempSrc: Oral                 Kally Cadden,W. EDMOND

## 2016-02-23 NOTE — Op Note (Signed)
OPERATIVE REPORT  DATE OF SURGERY: 02/23/2016  PATIENT:  Roy [name],  80 y.o. male  PRE-OPERATIVE DIAGNOSIS:  left ear cyst  POST-OPERATIVE DIAGNOSIS:  left ear cyst  PROCEDURE:  Procedure(s): EXCISION LEFT EAR CYST  SURGEON:  Beckie Salts, MD  ASSISTANTS: None  ANESTHESIA:   General   EBL:  30 ml  DRAINS: None  LOCAL MEDICATIONS USED:  1% Xylocaine with epinephrine  SPECIMEN:  Left periauricular skin cyst  COUNTS:  Correct  PROCEDURE DETAILS: The patient was taken to the operating room and placed on the operating table in the supine position. Following induction of local, sedation anesthesia, the left periauricular area was prepped and draped in a standard fashion. The proposed incision was outlined with a marking pen removing the entire area of abnormal skin. Total length of the defect and specimen was approximate 6 cm x 2 cm. Local anesthetic was infiltrated along the incisions. #15 scalpel was used to incise the skin. A left cautery was used to continue the dissection and to remove the entire abnormal area. The skin was undermined on both sides to facilitate closure. A running 3-0 chromic subcuticular closure was accomplished. Dermabond is used on the skin. Patient was transferred to PACU in stable condition.    PATIENT DISPOSITION:  To PACU, stable

## 2016-02-26 ENCOUNTER — Encounter (HOSPITAL_COMMUNITY): Payer: Self-pay | Admitting: Otolaryngology

## 2016-07-03 ENCOUNTER — Encounter (HOSPITAL_COMMUNITY): Payer: Self-pay | Admitting: Emergency Medicine

## 2016-07-03 ENCOUNTER — Ambulatory Visit (HOSPITAL_COMMUNITY)
Admission: EM | Admit: 2016-07-03 | Discharge: 2016-07-03 | Disposition: A | Discharge status: Home / Self Care | Payer: Medicare Other | Attending: Family Medicine | Admitting: Family Medicine

## 2016-07-03 DIAGNOSIS — M7521 Bicipital tendinitis, right shoulder: Secondary | ICD-10-CM

## 2016-07-03 MED ORDER — PREDNISONE 20 MG PO TABS: ORAL_TABLET | ORAL | 0 refills | Status: DC

## 2016-07-03 NOTE — ED Provider Notes (Signed)
[place]    CSN: [id number] Arrival date & time: 07/03/16  1345     History   Chief Complaint Chief Complaint  Patient presents with  . Arm Pain    HPI Roy [name] is a 81 y.o. male.   Patient was taking an old lift chair apart.  Patient had left arm and shoulder pain the next day.  Onset 2 1/2 weeks ago.  Not getting worse, but not getting better.  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      Past Medical History:  Diagnosis Date  . A-fib ([place])   . Arthritis HANDS/ THUMBS/ SHOULDERS  . Bladder calculi   . Chronic obstructive asthma, unspecified FOLLOWED BY DR Tarri Fuller YOUNG  VISIT 10-14-2011 IN [facility]  . COPD (chronic obstructive pulmonary disease) ([place])   . Cyst on ear    left  . Diabetes mellitus   . DVT (deep venous thrombosis) ([place])   . Dyspnea on exertion   . GERD (gastroesophageal reflux disease)   . Hematuria   . History of prostate cancer 2010-- S/P  EXTERNAL RADIATION  . Hydronephrosis, left   . Hypertension   . OSA on CPAP    wears CPAP  . Sepsis ([place])   . Wears glasses     Patient Active Problem List   Diagnosis Date Noted  . Acute deep vein thrombosis (DVT) of femoral vein of left lower extremity ([place])   . Bacteremia due to Enterococcus 10/09/2015  . CKD (chronic kidney disease) stage 3, GFR 30-59 ml/min 10/06/2015  . AKI (acute kidney injury) ([place]) 10/06/2015  . Sepsis secondary to UTI ([place]) 10/06/2015  . UTI (lower urinary tract infection) 10/06/2015  . DM2 (diabetes mellitus, type 2) ([place]) 01/06/2007  . EXOGENOUS OBESITY 01/06/2007  . Obstructive sleep apnea 01/06/2007  . Essential hypertension 01/06/2007  . Chronic obstructive airway disease with asthma ([place]) 01/06/2007    Past Surgical History:  Procedure Laterality Date  . CARPAL TUNNEL RELEASE  05-28-2005   LEFT  . CYSTOSCOPY N/A 09/26/2012   Procedure: CYSTOSCOPY, clot evacuation,fulgeration of bladder, bladder biopsy,  transurethreal vaporation of prostat;  Surgeon: Alexis Frock, MD;  Location: [facility];  Service: Urology;  Laterality: N/A;  . CYSTOSCOPY W/ RETROGRADES Bilateral 10/14/2015   Procedure: CYSTOSCOPY WITH BILATERAL RETROGRADE PYELOGRAM/ FULGERATION WITH LITHOLAPAXY;  Surgeon: Festus Aloe, MD;  Location: [facility];  Service: Urology;  Laterality: Bilateral;  . CYSTOSCOPY WITH LITHOLAPAXY  10/18/2011   Procedure: CYSTOSCOPY WITH LITHOLAPAXY;  Surgeon: Hanley Ben, MD;  Location: [facility];  Service: Urology;  Laterality: N/A;  1 HOUR NO STENT H# [id number] CELL# [phone]  MEDICARE BCBS  . EAR CYST EXCISION Left 02/23/2016   Procedure: EXCISION LEFT EAR CYST;  Surgeon: Izora Gala, MD;  Location: [facility];  Service: ENT;  Laterality: Left;  . GANGLION CYST EXCISION  1988  (APPROX)   LEFT KNEE  . [place]  . TEE WITHOUT CARDIOVERSION N/A 10/11/2015   Procedure: TRANSESOPHAGEAL ECHOCARDIOGRAM (TEE);  Surgeon: Sanda Klein, MD;  Location: [facility]COPY;  Service: Cardiovascular;  Laterality: N/A;       Home Medications    Prior to Admission medications   Medication Sig Start Date End Date Taking? Authorizing Provider  amitriptyline (ELAVIL) 10 MG tablet Take 10 mg by mouth at bedtime as needed for sleep.     Historical Provider, MD  apixaban (ELIQUIS) 5 MG TABS tablet Take 1 tablet (5 mg  total) by mouth 2 (two) times daily. 10/18/15   Bonnell Public, MD  cephALEXin (KEFLEX) 500 MG capsule Take 1 capsule (500 mg total) by mouth 3 (three) times daily. 02/23/16   Izora Gala, MD  diltiazem (CARDIZEM CD) 240 MG 24 hr capsule Take 240 mg by mouth daily.    Historical Provider, MD  finasteride (PROSCAR) 5 MG tablet Take 5 mg by mouth daily.    Historical Provider, MD  gabapentin (NEURONTIN) 100 MG capsule Take 100 mg by mouth daily.    Historical Provider, MD  glimepiride (AMARYL) 4 MG tablet Take 4 mg by mouth daily with breakfast.     Historical Provider, MD    lisinopril (PRINIVIL,ZESTRIL) 40 MG tablet Take 40 mg by mouth daily.     Historical Provider, MD  NON FORMULARY 1 each by Other route See admin instructions. CPAP machine as needed during the day and nightly    Historical Provider, MD  pantoprazole (PROTONIX) 40 MG tablet Take 40 mg by mouth daily.     Historical Provider, MD  polyethylene glycol (MIRALAX / GLYCOLAX) packet Take 17 g by mouth daily as needed for moderate constipation.     Historical Provider, MD  potassium chloride SA (K-DUR,KLOR-CON) 20 MEQ tablet Take 20 mEq by mouth every Monday, Wednesday, and Friday.    Historical Provider, MD  predniSONE (DELTASONE) 20 MG tablet Two daily with food 07/03/16   Robyn Haber, MD  QUEtiapine (SEROQUEL) 25 MG tablet Take 25 mg by mouth at bedtime.    Historical Provider, MD  sitaGLIPtin (JANUVIA) 100 MG tablet Take 100 mg by mouth daily.     Historical Provider, MD  tamsulosin (FLOMAX) 0.4 MG CAPS capsule Take 1 capsule (0.4 mg total) by mouth daily. 10/18/15   Bonnell Public, MD  torsemide [facility]) 100 MG tablet Take 100 mg by mouth every Monday, Wednesday, and Friday.    Historical Provider, MD    Family History Family History  Problem Relation Age of Onset  . Diabetes Father     Social History Social History  Substance Use Topics  . Smoking status: Former Smoker    Packs/day: 0.50    Years: 25.00    Types: Cigarettes    Quit date: 03/25/1998  . Smokeless tobacco: Never Used  . Alcohol use Yes     Comment: OCCASIONAL BEER     Allergies   Patient has no known allergies.   Review of Systems Review of Systems  HENT: Negative.   Respiratory: Negative.   Cardiovascular: Negative.   Neurological: Positive for dizziness.  All other systems reviewed and are negative.    Physical Exam Triage Vital Signs ED Triage Vitals  Enc Vitals Group     BP 07/03/16 1413 (!) 194/94     Pulse Rate 07/03/16 1413 83     Resp 07/03/16 1413 16     Temp 07/03/16 1413 97.8 F (36.6  C)     Temp Source 07/03/16 1413 Oral     SpO2 07/03/16 1413 97 %     Weight --      Height --      Head Circumference --      Peak Flow --      Pain Score 07/03/16 1412 9     Pain Loc --      Pain Edu? --      Excl. in [place]? --    No data found.   Updated Vital Signs BP (!) 194/94 (BP Location: Right  Arm)   Pulse 83   Temp 97.8 F (36.6 C) (Oral)   Resp 16   SpO2 97%    Physical Exam  Constitutional: He is oriented to person, place, and time. He appears well-developed and well-nourished.  HENT:  Right Ear: External ear normal.  Left Ear: External ear normal.  Mouth/Throat: Oropharynx is clear and moist.  Eyes: Conjunctivae and EOM are normal. Pupils are equal, round, and reactive to light.  Neck: Normal range of motion. Neck supple.  Pulmonary/Chest: Effort normal.  Musculoskeletal: He exhibits tenderness. He exhibits no deformity.  Patient has pain when trying to lift his left arm above his shoulder. He has tenderness over the long head of the biceps. Patient has pain when trying to flex his elbow.  Neurological: He is alert and oriented to person, place, and time.  Skin: Skin is warm and dry.  Nursing note and vitals reviewed.    UC Treatments / Results  Labs (all labs ordered are listed, but only abnormal results are displayed) Labs Reviewed - No data to display  EKG  EKG Interpretation None       Radiology No results found.  Procedures Procedures (including critical care time)  Medications Ordered in UC Medications - No data to display   Initial Impression / Assessment and Plan / UC Course  I have reviewed the triage vital signs and the nursing notes.  Pertinent labs & imaging results that were available during my care of the patient were reviewed by me and considered in my medical decision making (see chart for details).     Final Clinical Impressions(s) / UC Diagnoses   Final diagnoses:  Biceps tendonitis on right    New  Prescriptions New Prescriptions   PREDNISONE (DELTASONE) 20 MG TABLET    Two daily with food     Robyn Haber, MD 07/03/16 1428

## 2016-07-03 NOTE — ED Triage Notes (Signed)
Patient was taking an old lift chair apart.  Patient had pain the next day.  Onset 2 1/2 weeks ago.  Not getting worse, but not getting better

## 2016-11-13 ENCOUNTER — Encounter: Payer: Self-pay | Admitting: Internal Medicine

## 2016-11-14 ENCOUNTER — Encounter: Payer: Self-pay | Admitting: Internal Medicine

## 2016-11-14 ENCOUNTER — Ambulatory Visit (INDEPENDENT_AMBULATORY_CARE_PROVIDER_SITE_OTHER): Payer: Medicare Other | Admitting: Internal Medicine

## 2016-11-14 VITALS — BP 140/70 | HR 77 | Ht 70.0 in | Wt 244.2 lb

## 2016-11-14 DIAGNOSIS — G4733 Obstructive sleep apnea (adult) (pediatric): Secondary | ICD-10-CM

## 2016-11-14 DIAGNOSIS — J449 Chronic obstructive pulmonary disease, unspecified: Secondary | ICD-10-CM | POA: Diagnosis not present

## 2016-11-14 NOTE — Progress Notes (Signed)
Subjective:    Patient ID: Roy [name], male    DOB: 08/04/1935, 81 y.o.   MRN: [id number]  HPI  male former smoker followed for OSA, asthma/COPD NPSG 04/15/01 AHI 49/hour, desaturation to 72%, body weight 260 pounds ---------------------------------------------------------------------------------  11/13/2015-81 year old male former smoker followed for OSA, asthma/COPD BiPAP Insp 17, Exp 4, Advanced> today > 18/5 Follows for: OSA. Pt reports he uses CPAP nightly and occasionally when he naps. Pt reports that he sleeps wel.. Pt reports he takes naps on a few days a week. Pt states that he is eligible for new CPAP machine in 10/17.  Hospitalized for syncopal episode in July associated with DVT and UTI. VQ scan low probability for PE. Feels fine now. On Eliquis. Says breathing well without inhalers in 3 or 4 years. Definitely sleeps better with his BiPAP. Download shows good compliance with 84%/4 hours. Suboptimal control with AHI 13.6/hour.  11/14/16- 81 year old male former smoker followed for OSA, asthma/COPD BiPAP Insp 17, Exp 4, Advanced> today > 18/5 FOLLOWS FOR: DME [facility]. Pt wears BiPAP nightly and nap time. Pressures okay with patient. No new supplies needed at this time  WTW on flu shot BiPAP 18/5/Advanced Download-100% compliance averaging 9 hours per night. Moderately high leak. Residual AHI 19.1/hour, obstructives and centrals.. DVT left lower leg without PE, 08/14/15  //Needs office spirometry//  ROS-see HPI   + = pos Constitutional:    weight loss, night sweats, fevers, chills, fatigue, lassitude. HEENT:   No-  headaches, difficulty swallowing, tooth/dental problems, sore throat,       No-  sneezing, itching, ear ache, nasal congestion, post nasal drip,  CV:  No-   chest pain, orthopnea, PND, + swelling in lower extremities, anasarca,  dizziness, palpitations Resp: No-   shortness of breath with exertion or at rest.              No-   productive cough,  No non-productive  cough,  No- coughing up of blood.              No-   change in color of mucus.  No- wheezing.   Skin: No-   rash or lesions. GI:  No-   heartburn, indigestion, abdominal pain, nausea, vomiting,  GU:  MS:  No-   joint pain or swelling.   Neuro-     nothing unusual Psych:  No- change in mood or affect. No depression or anxiety.  No memory loss.  Objective:   Physical Exam General- Alert, Oriented, Affect-appropriate, Distress- none acute, + obese Skin- rash-none, lesions- none, excoriation- none Lymphadenopathy- none Head- atraumatic            Eyes- Gross vision intact, PERRLA, conjunctivae clear secretions            Ears- Hearing, canals            Nose- Clear, No- Septal dev, mucus, polyps, erosion, perforation             Throat- Mallampati III , mucosa clear , drainage- none, tonsils- present.  + Dental repair Neck- flexible , trachea midline, no stridor , thyroid nl, carotid no bruit Chest - symmetrical excursion , unlabored           Heart/CV- RRR , no murmur , no gallop  , no rub, nl s1 s2                           - JVD- none , edema +  bilateral, stasis changes- none, varices- none           Lung- clear to P&A, wheeze- none, cough- none , dullness-none, rub- none           Chest wall-  Abd-  Br/ Gen/ Rectal- Not done, not indicated Extrem- cyanosis- none, clubbing, none, atrophy- none, strength- nl, + cane Neuro- grossly intact to observation Assessment & Plan:

## 2016-11-14 NOTE — Patient Instructions (Signed)
Order- DME [facility] Please change BIPAP to Inspiratory 20, Expiratory 5 cwp, continue mask of choice humidifier, supplies, AirView    Dx OSA  Don't forget that flu shot later this fall  Please call if we can help

## 2016-11-17 NOTE — Assessment & Plan Note (Signed)
I'm not finding recent documentation. I have marked the chart to indicate need for future spirometry

## 2016-11-17 NOTE — Assessment & Plan Note (Signed)
Compliance is quite good. Not clear why we aren't getting better control based on AHI, even allowing for some central events. Plan-change BiPAP arranged to 20/5. Continue encourage weight loss.

## 2017-04-04 IMAGING — US US RENAL
1 series · 13 of 25 positions shown · non-contrast
Comparison: CT 10/15/2011

CLINICAL DATA: 80-year-old male with a history of acute kidney
injury

EXAM:
RENAL / URINARY TRACT ULTRASOUND COMPLETE

[Series 1: us renal · 0.28mm/px · 13 of 51 slices shown]
[im 1/51]
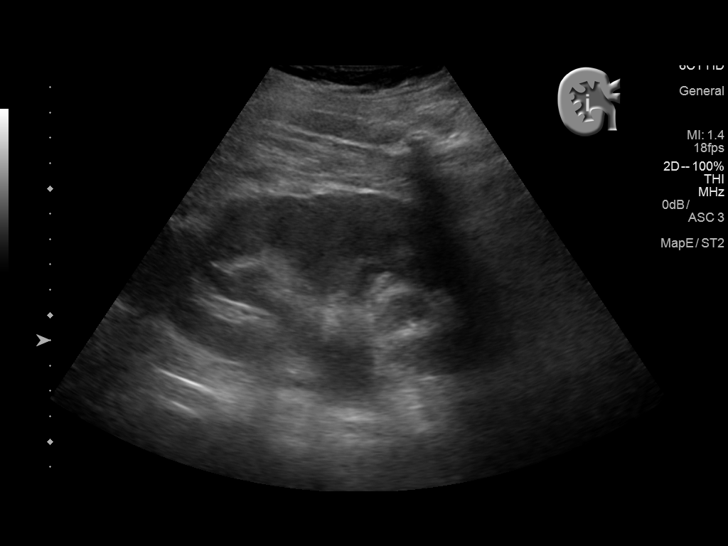
[im 5/51]
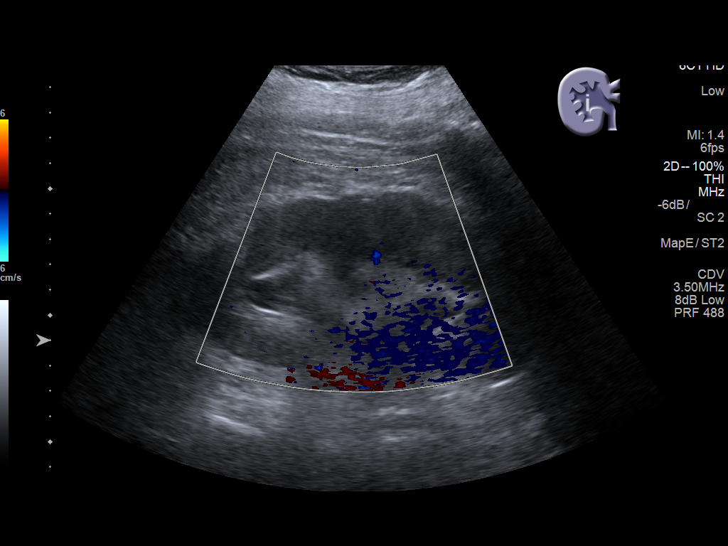
[im 9/51]
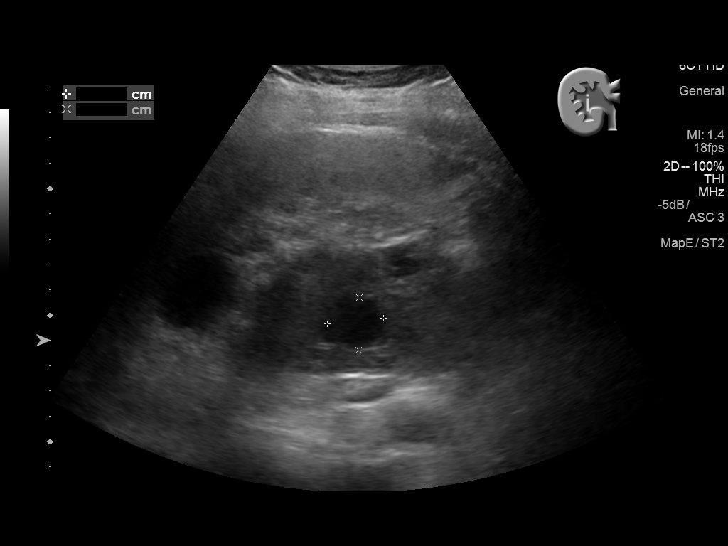
[im 13/51]
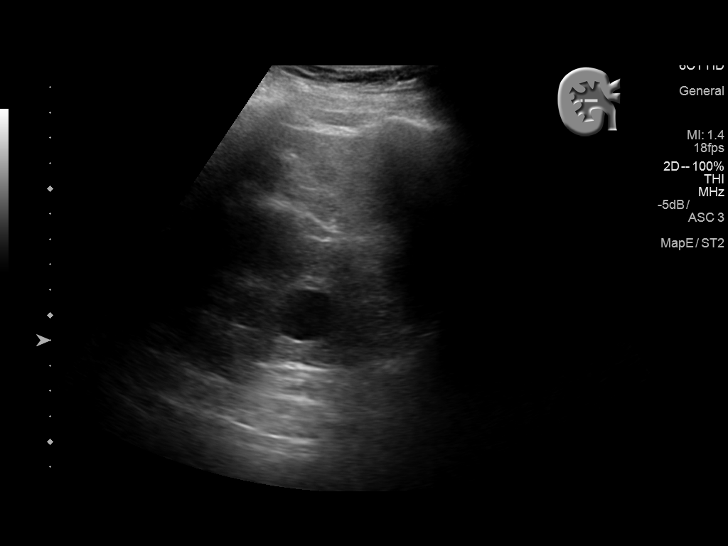
[im 17/51]
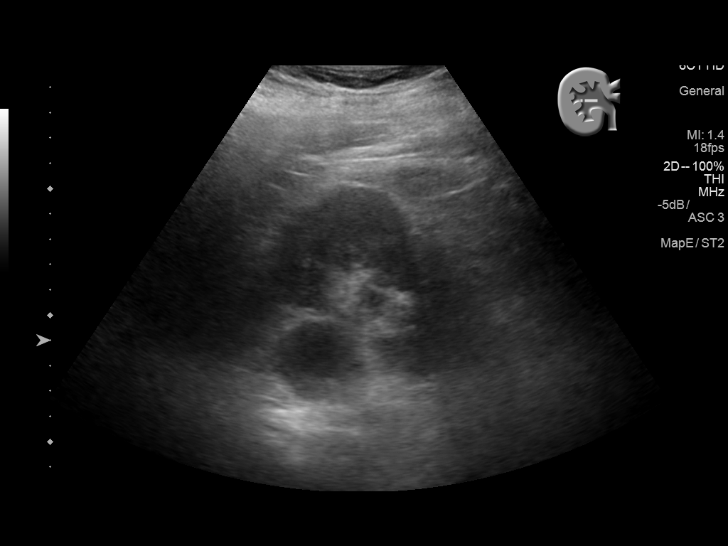
[im 21/51]
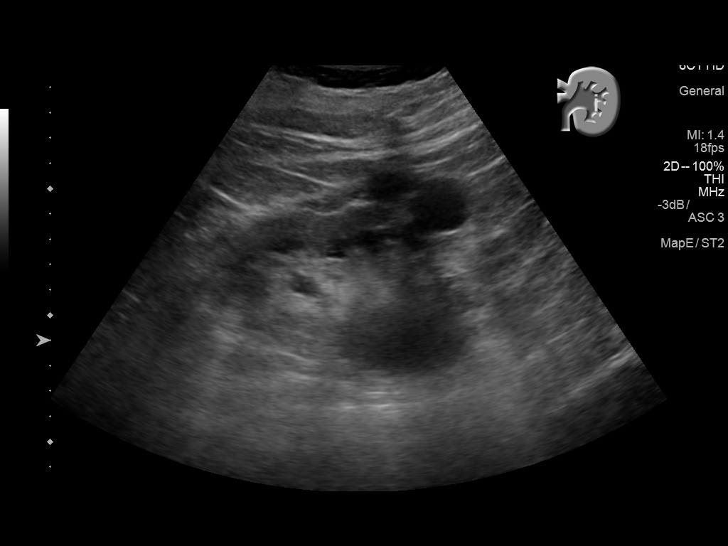
[im 26/51]
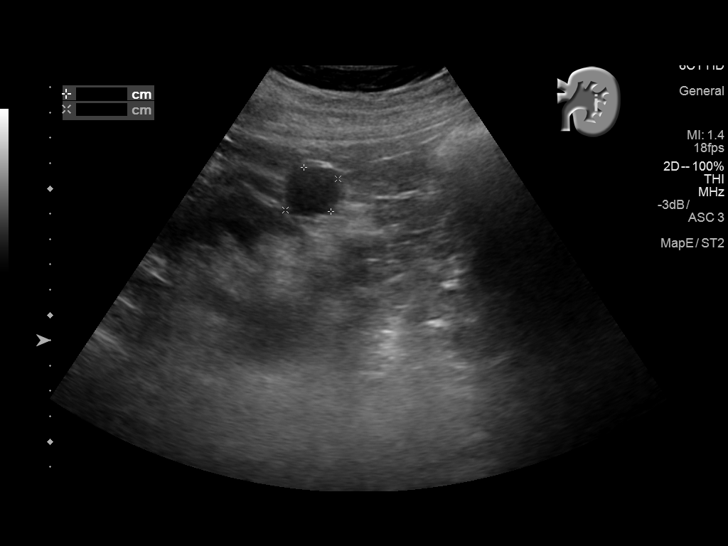
[im 30/51]
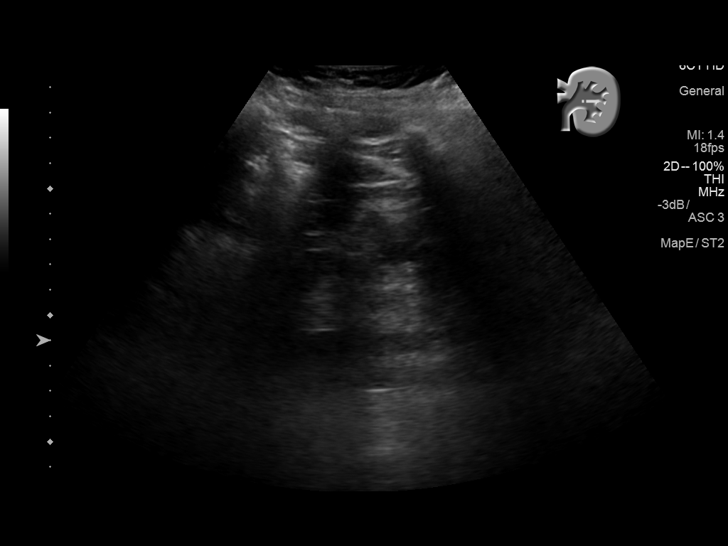
[im 34/51]
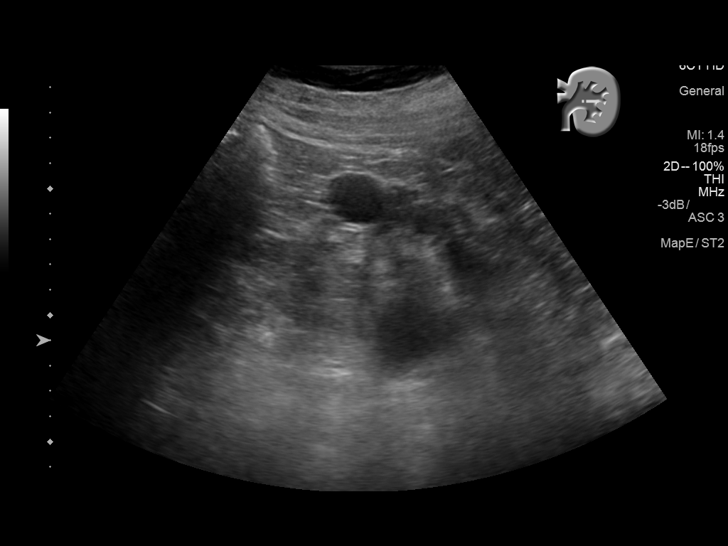
[im 38/51]
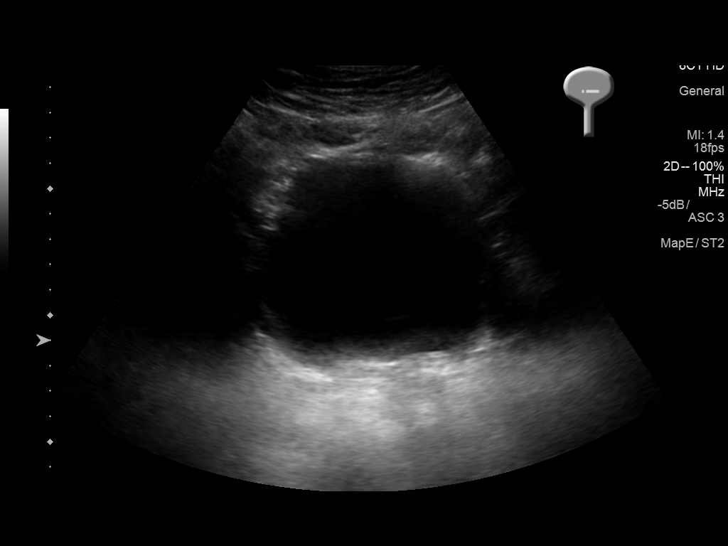
[im 42/51]
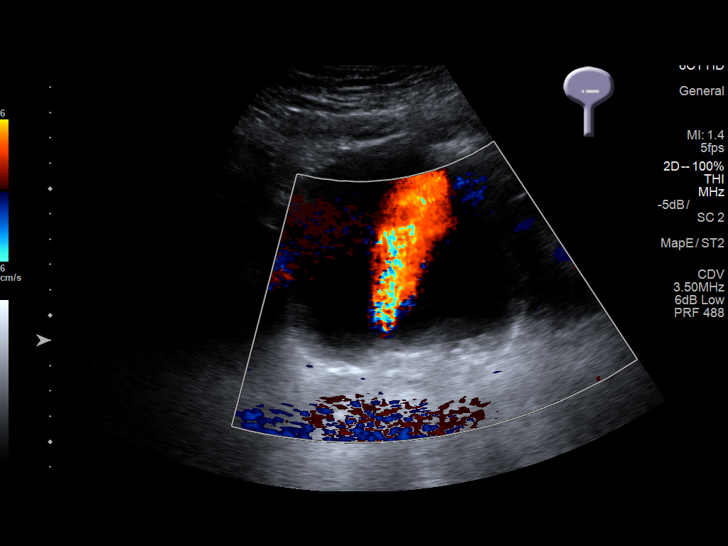
[im 46/51]
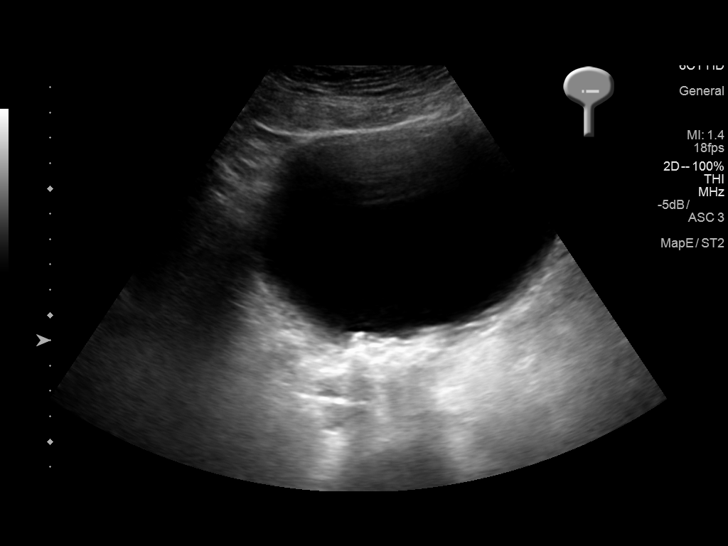
[im 51/51]
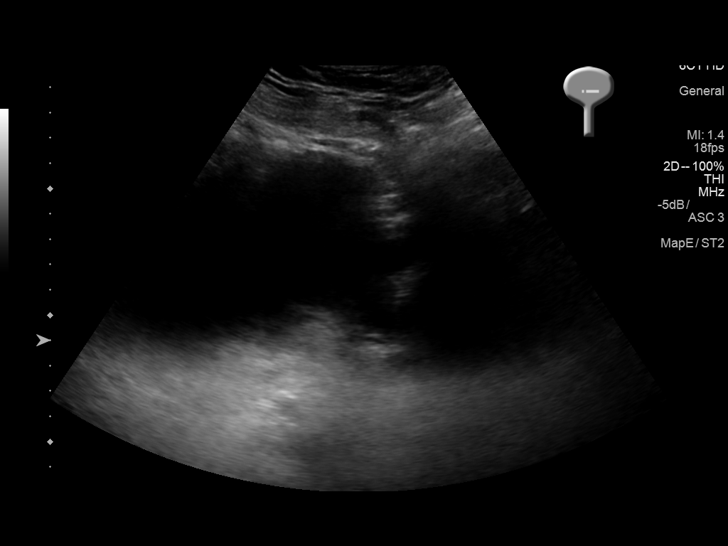

[13 of 25 positions shown; findings below may reference images not displayed]

FINDINGS: Right Kidney:

Length: 14.0 cm.  Moderate right-sided pelvicaliectasis.

Anechoic structure on the superior cortex, measuring 3.3 cm x 3.4 cm
x 3.3 cm. Posterior acoustic enhancement.

Adjacent cystic lesion measures 2.2 cm x 2.0 cm x 2.1 cm. Posterior
acoustic enhancement.

Left Kidney:

Length: 11.6 cm. Asymmetric cortical thinning of the left kidney
compared to the right. Dilation of the collecting system, with mild
pelvicaliectasis.

Multilobulated anechoic cystic structure at the inferior left kidney
measures 5.2 cm x 4.7 cm x 4.5 cm

Additional anechoic cystic structure of the left kidney measures
cm x 2.4 cm x 1.9 cm

Bladder:

Urinary distention of the bladder. Right urinary jet identified.
Debris within the dependent aspects of the urinary bladder, was
present on comparison CT study.
IMPRESSION: Right:

Moderate right-sided pelvicaliectasis, which is essentially
persistent from the CT of 2968. A right ureteral jet is identified
within the urinary bladder.

Right-sided kidney cysts, most likely Bosniak 1 cysts.

Left:

Asymmetric cortical thinning of the left kidney, indicating chronic
medical renal disease. This cortical thinning was present on the CT
of 2968 when there was significant hydronephrosis/ pelvicaliectasis.
Current ultrasound survey demonstrates persistent
hydronephrosis/pelvicaliectasis. No left-sided ureteral jet
identified on the current study.

Bladder stones/debris at the dependent bladder, again unchanged from
comparison CT.

Cystic changes on the left may represent dilated calyx and/or
Bosniak 1 cysts.

## 2017-04-07 IMAGING — US US RENAL
1 series · 13 of 25 positions shown · non-contrast
Comparison: October 07, 2015

CLINICAL DATA: Question of hydronephrosis on recent prior
ultrasound examination

EXAM:
RENAL / URINARY TRACT ULTRASOUND COMPLETE

[Series 1: us renal · 0.28mm/px · 142 acquisitions, 13 frames shown]
[im 1/142]
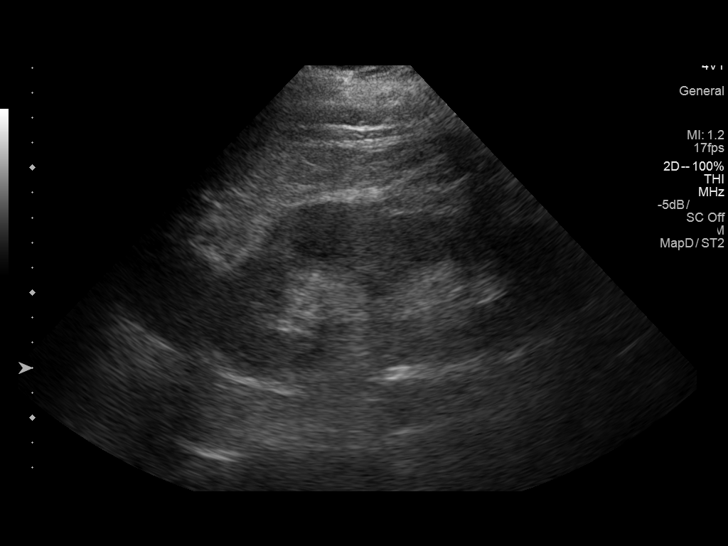
[im 12/142]
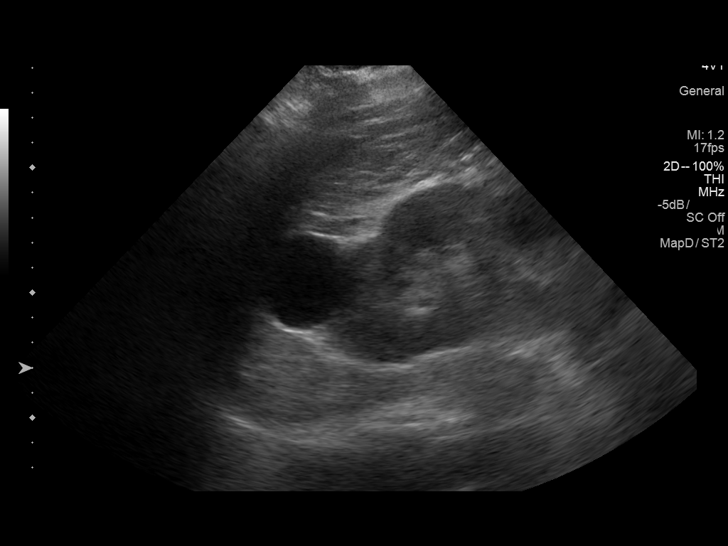
[im 24/142]
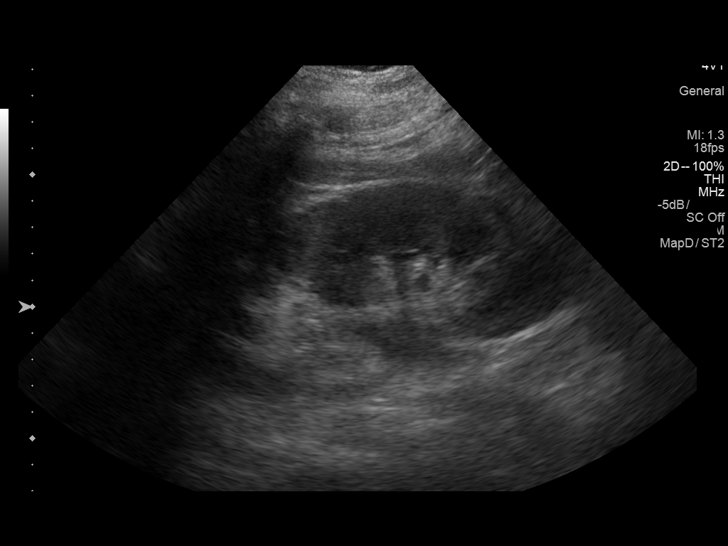
[im 36/142]
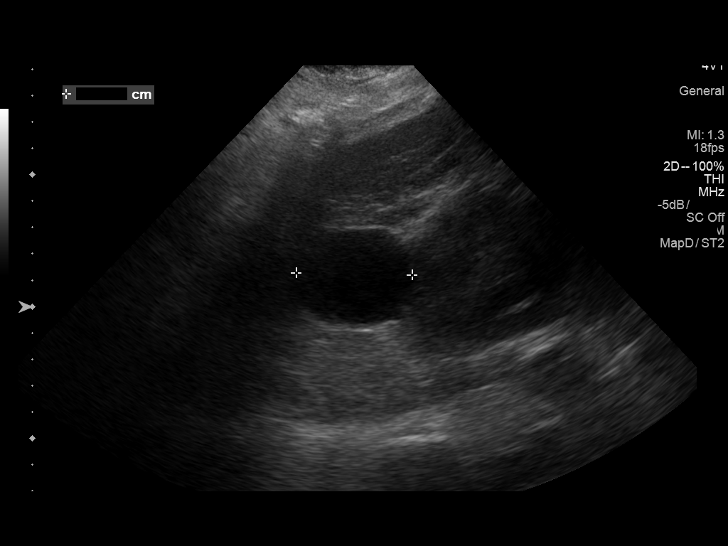
[im 48/142]
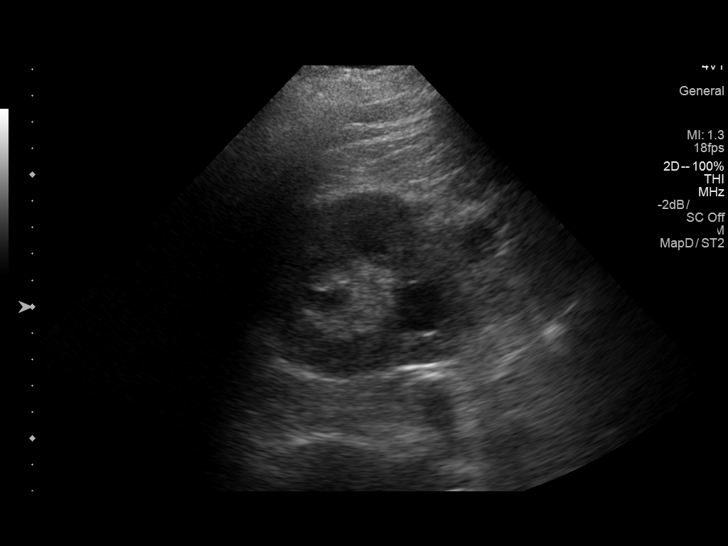
[im 59/142]
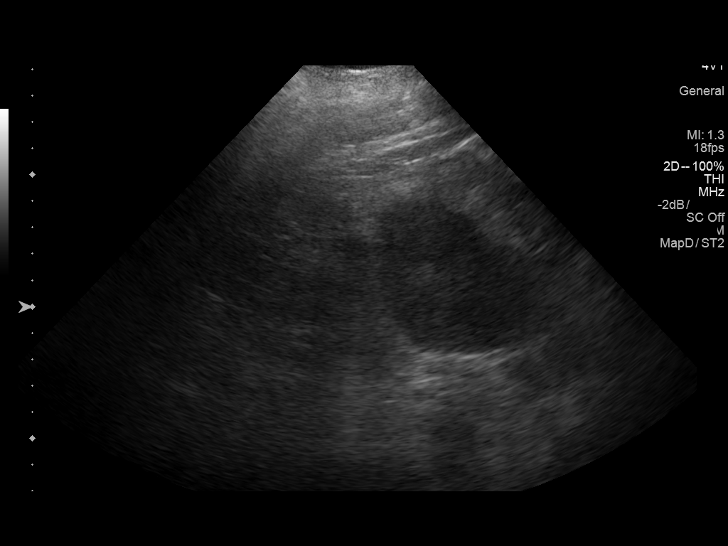
[im 71/142]
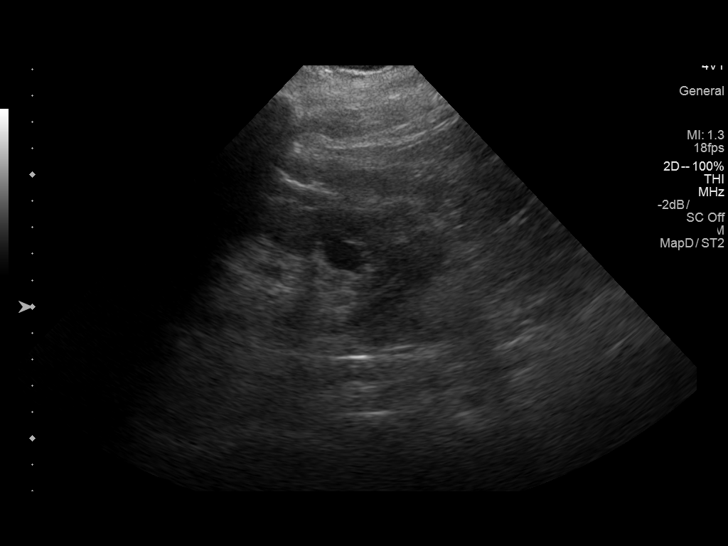
[im 83/142]
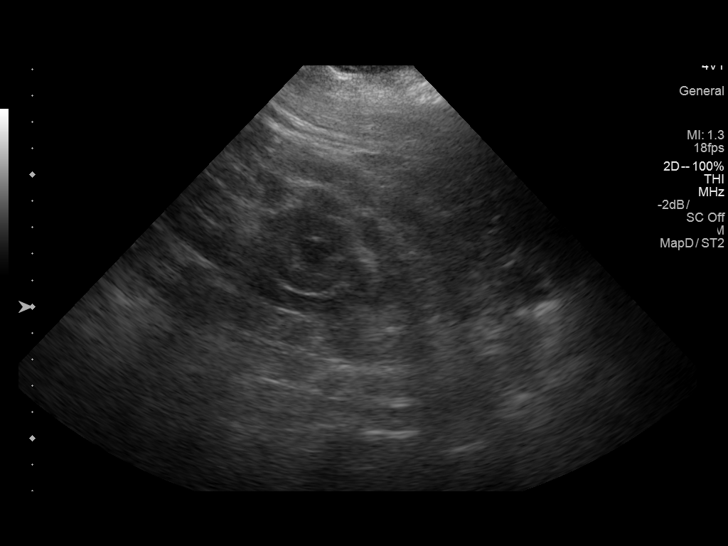
[im 95/142]
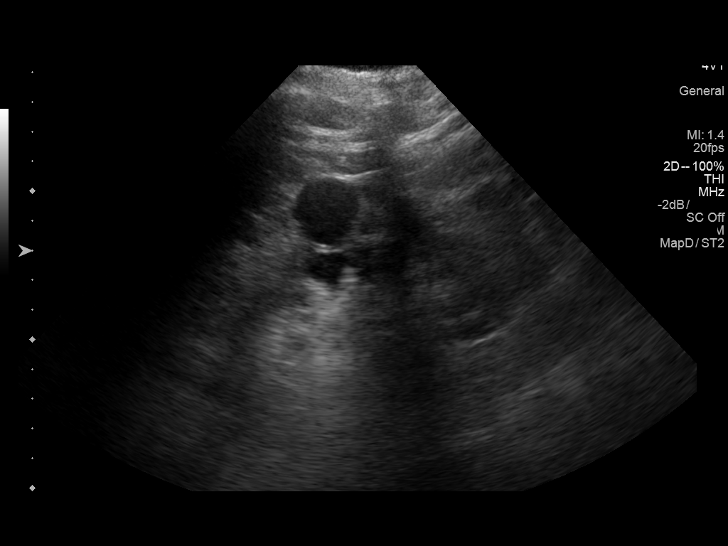
[im 106/142]
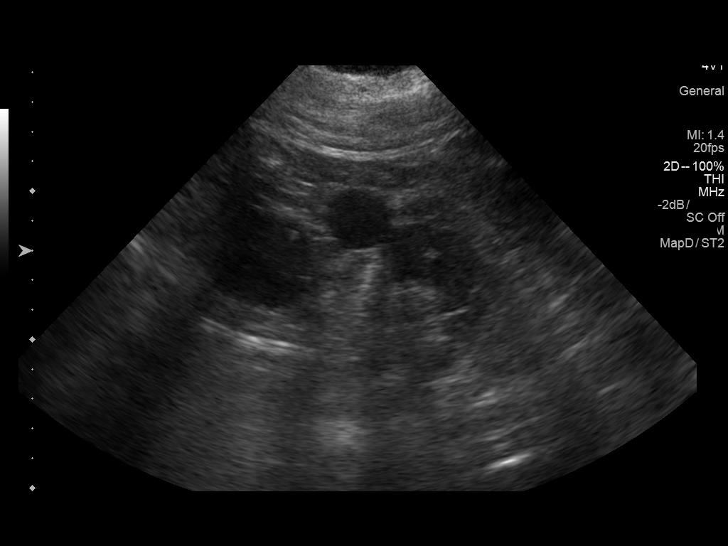
[im 118/142]
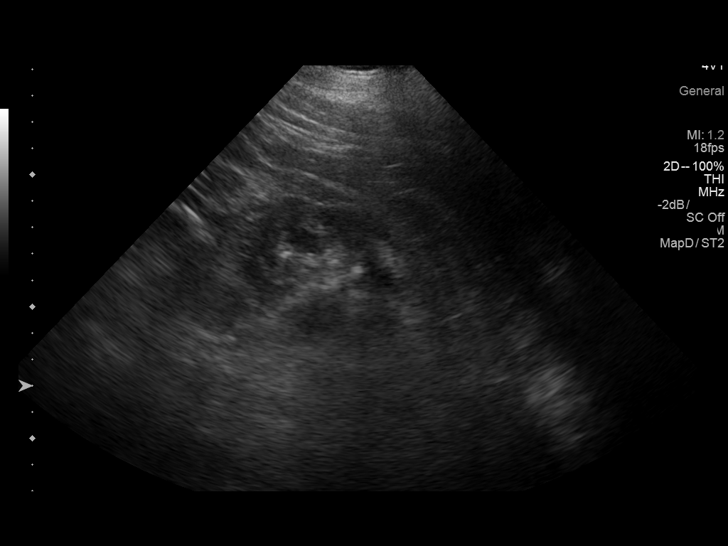
[im 130/142]
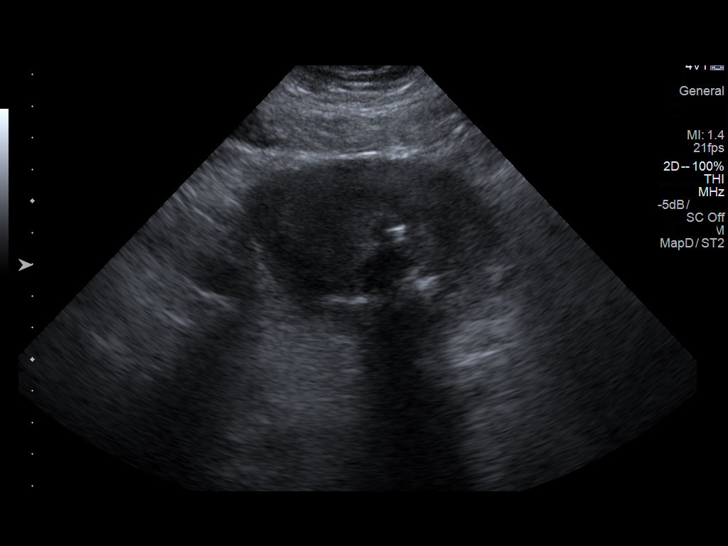
[im 142/142]
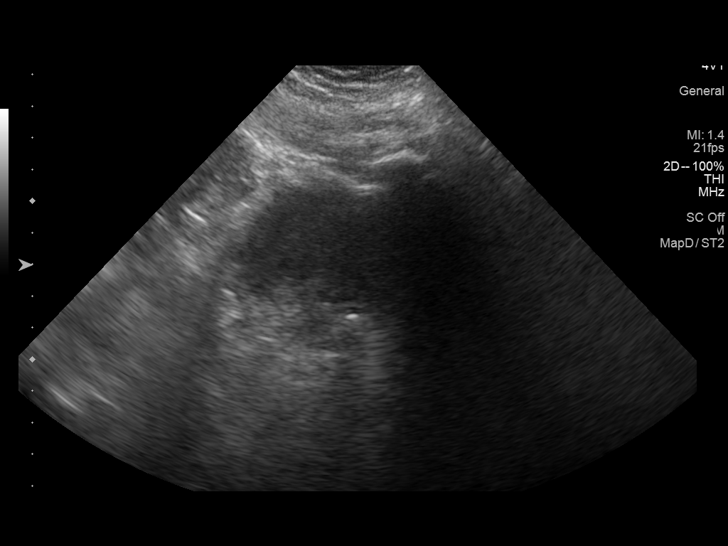

[13 of 25 positions shown; findings below may reference images not displayed]

FINDINGS: Right Kidney:

Length: 14.5 cm. Echogenicity and renal cortical thickness are
within normal limits. There is no perinephric fluid. There is slight
fullness of the right renal pelvis. There is no caliectasis seen.
There is a focal cyst arising from the upper pole of the right
kidney measuring 4 3.6 x 3.7 x 4.4 cm. A second cyst in the upper to
mid portion of the right kidney measures 1.8 x 2.0 x 1.9 cm. No
sonographically demonstrable calculus or ureterectasis.

Left Kidney:

Length: 12.9 cm. Echogenicity is within normal limits. Renal
cortical thickness is low normal. No perinephric fluid visualized.
There is no appreciable hydronephrosis on the left. There is a cyst
arising from the mid to lower portion left kidney measuring 4.7 x
4.2 x 5.4 cm. There is a cyst in the periphery of the left mid
kidney measuring 2.2 x 2.4 x 1.8 cm. A nearby cyst in this area
measures 2.7 x 2.3 x 2.0 cm. No sonographically demonstrable
calculus or ureterectasis.

Bladder:

There is a Foley catheter in the urinary bladder. In spite of the
presence of the Foley catheter, there is noted to be significant
debris within the urinary bladder.
IMPRESSION: Suspect a degree of cystitis with debris seen in the urinary bladder
despite presence of a Foley catheter. A small amount of urine
remains in the urinary bladder currently. There are cysts arising
from both kidneys. There is slight fullness of the right renal
pelvis without caliectasis. There is left renal collecting system
fullness currently compared to prior study. No new renal lesions are
evident.

## 2017-06-25 IMAGING — CT CT HEAD W/O CM
3 of 6 series · 16 of 47 positions shown, 19 images · non-contrast
Comparison: June 02, 2014

CLINICAL DATA: Several recent falls

EXAM:
CT HEAD WITHOUT CONTRAST
TECHNIQUE: Contiguous axial images were obtained from the base of the skull
through the vertex without intravenous contrast.

[Series 2: headseq 4.8 h45s · axial · 0.43mm/px · z∈[-107,+28]mm · 11 of 33 slices shown, 14 images]
[im 2/33  brain]
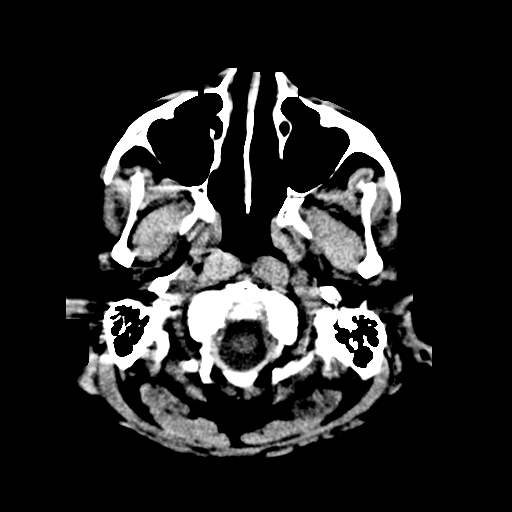
[im 2/33  bone]
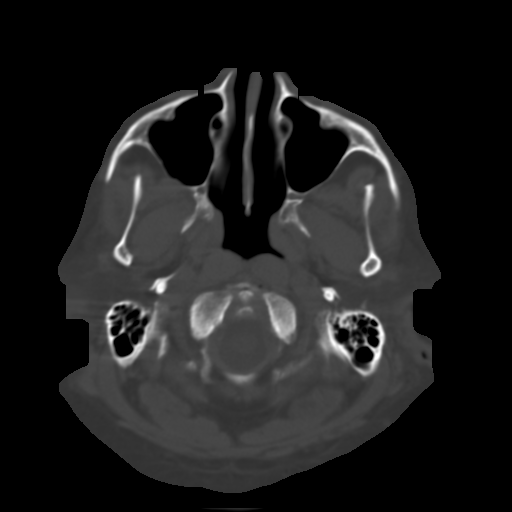
[im 6/33  brain]
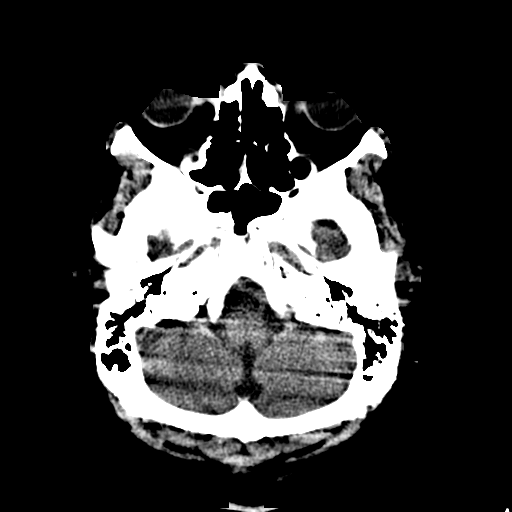
[im 8/33  brain]
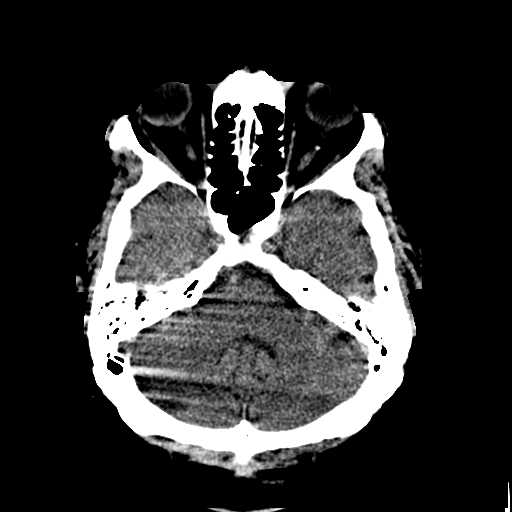
[im 11/33  brain]
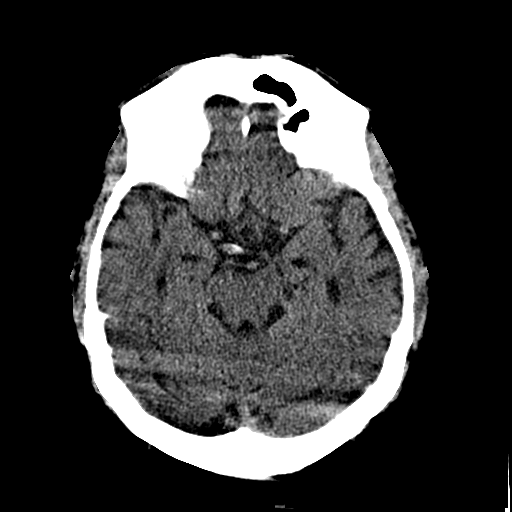
[im 13/33  brain]
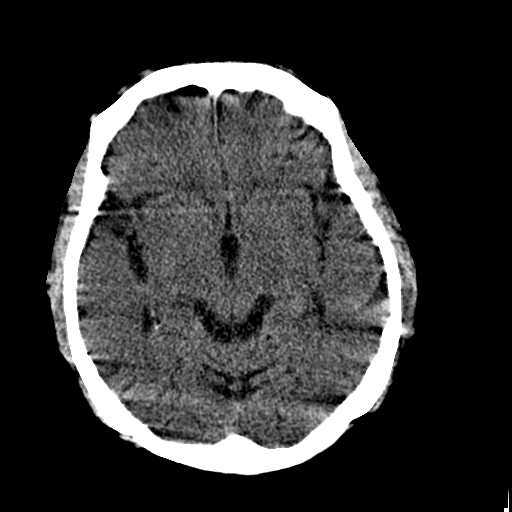
[im 13/33  bone]
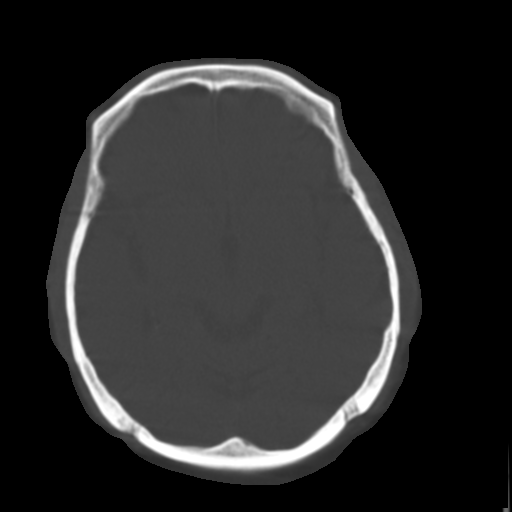
[im 17/33  brain]
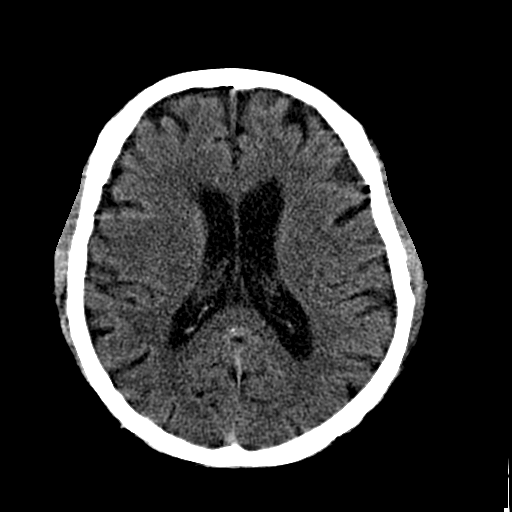
[im 20/33  brain]
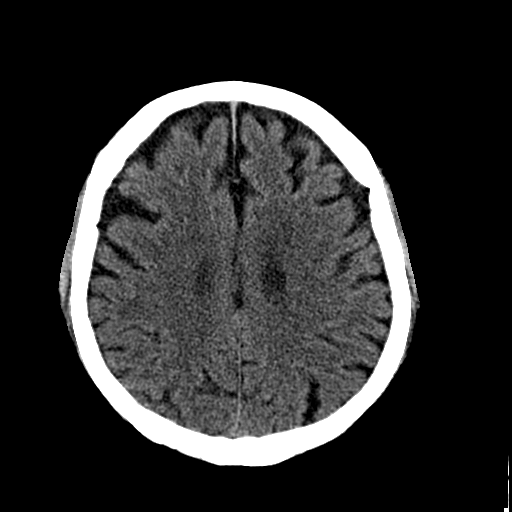
[im 22/33  brain]
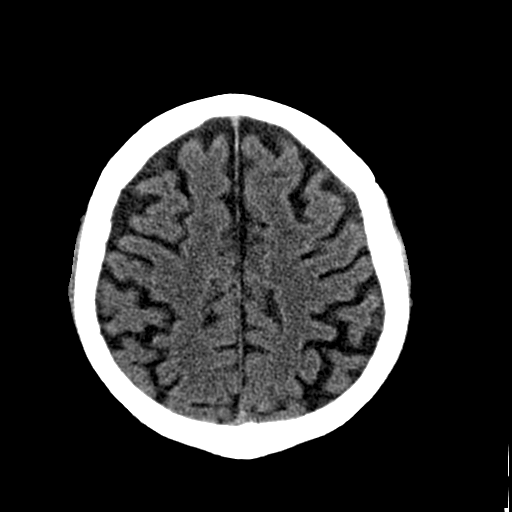
[im 25/33  brain]
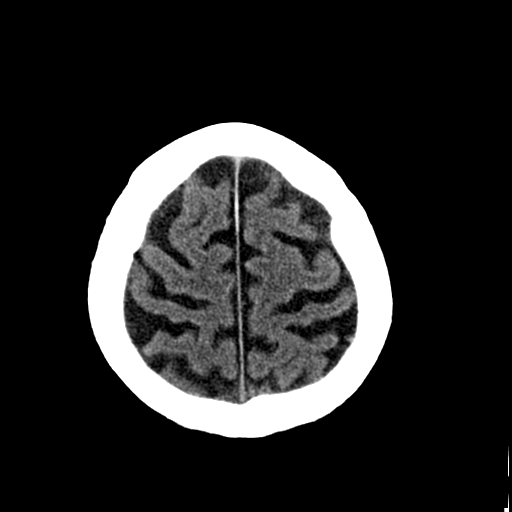
[im 25/33  bone]
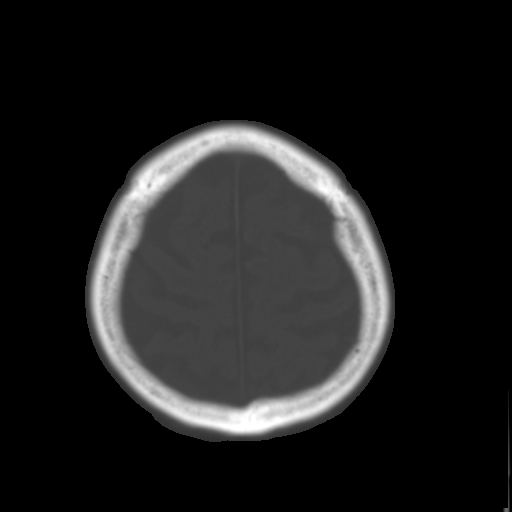
[im 27/33  brain]
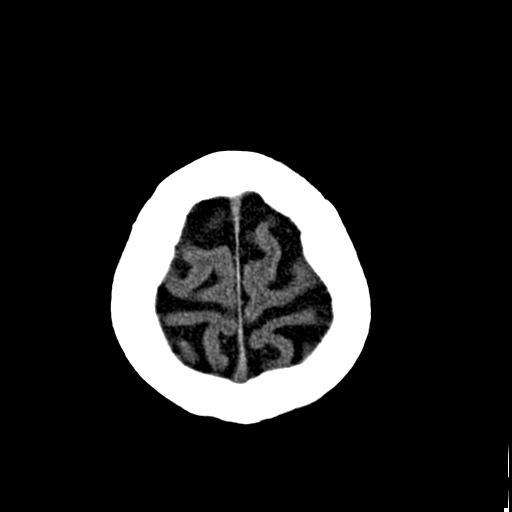
[im 31/33  brain]
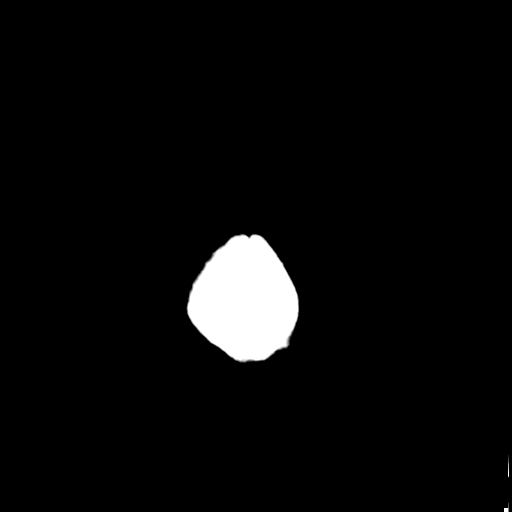

[Series 602: cor · coronal · 0.46mm/px · 3 of 64 slices shown]
[im 16/64  brain]
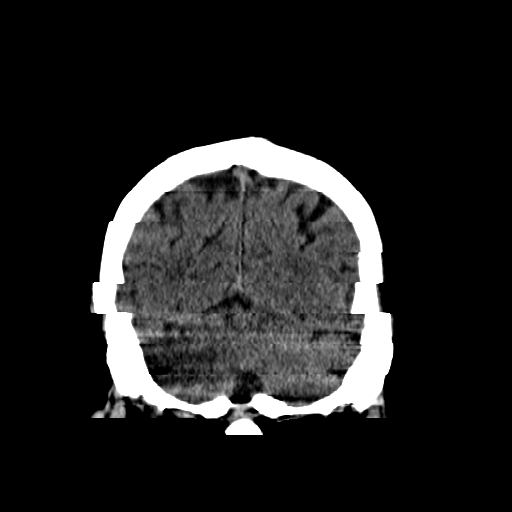
[im 32/64  brain]
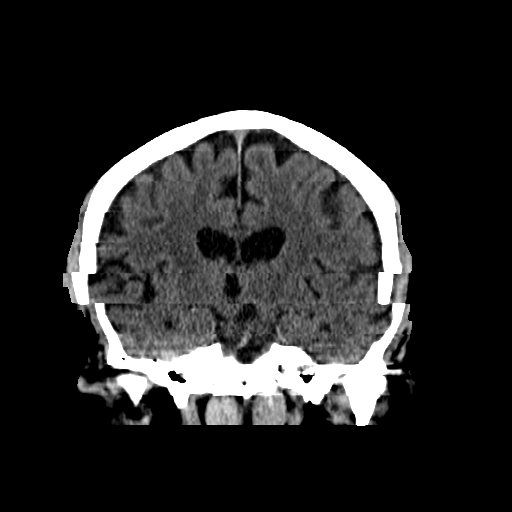
[im 48/64  brain]
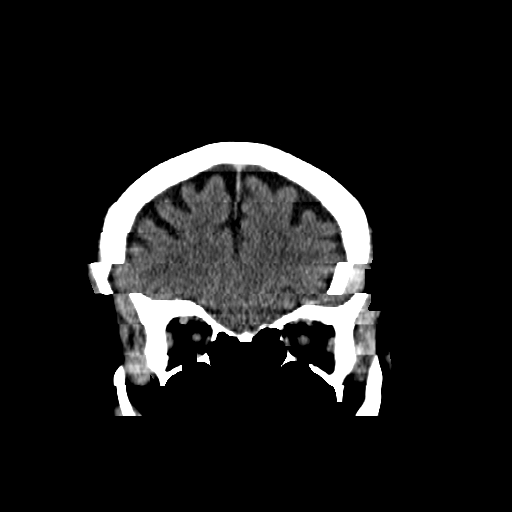

[Series 603: sag · sagittal · 0.46mm/px · 2 of 60 slices shown]
[im 20/60  brain]
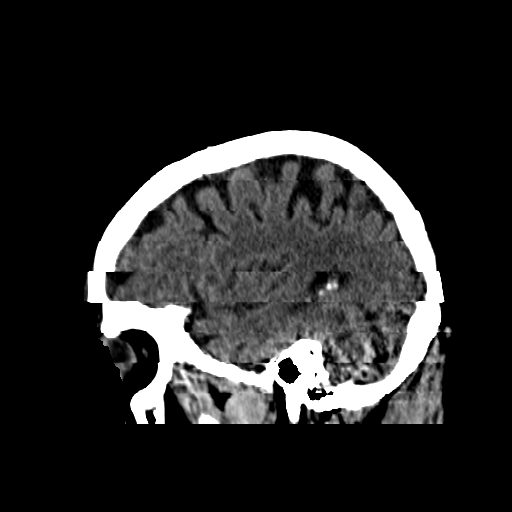
[im 40/60  brain]
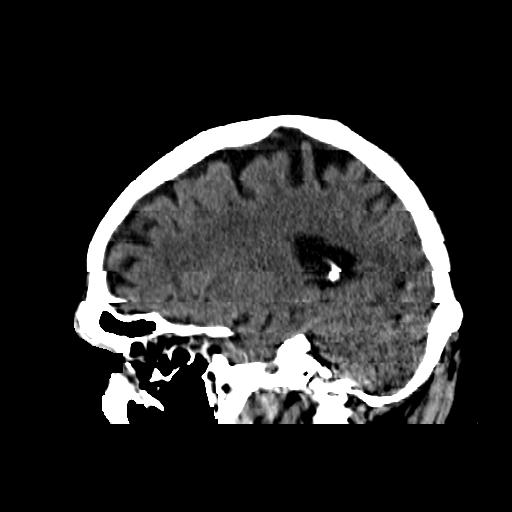

[16 of 47 positions shown; findings below may reference images not displayed]

FINDINGS: Brain: There is mild diffuse atrophy. There is no intracranial mass,
hemorrhage, extra-axial fluid collection, or midline shift. There is
mild small vessel disease in the centra semiovale bilaterally.
Elsewhere gray-white compartments are normal. No acute infarct
evident.

Vascular: There is calcification in each cavernous carotid artery.
No hyperdense vessels are apparent.

Skull: The bony calvarium appears intact.

Sinuses/Orbits: Orbits appear symmetric bilaterally. Visualized
paranasal sinuses are clear.

Other: Mastoid air cells are clear.
IMPRESSION: Atrophy with mild periventricular small vessel disease. No
intracranial mass, hemorrhage, or extra-axial fluid collection. No
acute infarct evident. There are foci of arterial vascular
calcification in the cavernous carotid arteries.

## 2017-06-26 IMAGING — DX DG CHEST 1V PORT
1 series · 1 of 1 positions shown · non-contrast
Comparison: 10/06/2015, CT 01/14/2006

CLINICAL DATA: 80-year-old male with a history of dyspnea

EXAM:
PORTABLE CHEST 1 VIEW

[chest ap]
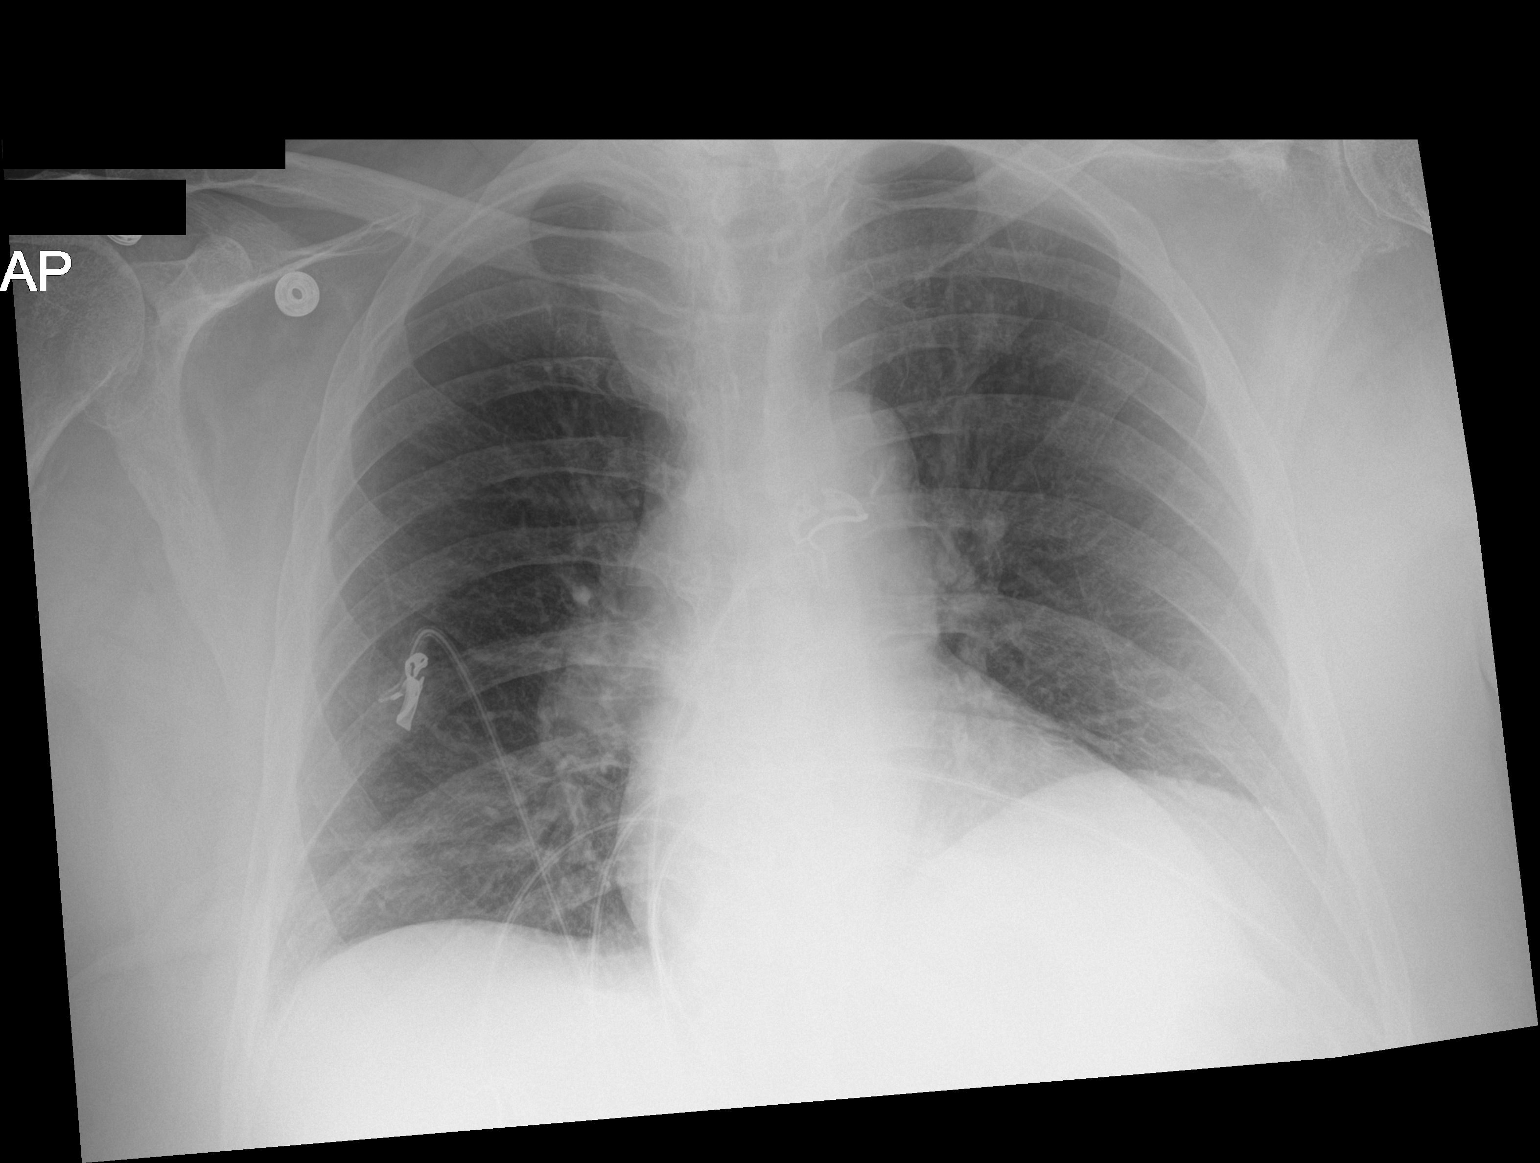

[1 of 1 positions shown; findings below may reference images not displayed]

FINDINGS: Cardiomediastinal silhouette unchanged.

No pneumothorax or pleural effusion. No confluent airspace disease.
Linear opacities at the lung bases compatible of atelectasis.
IMPRESSION: No radiographic evidence of acute cardiopulmonary disease

## 2018-02-16 ENCOUNTER — Ambulatory Visit: Payer: Medicare Other | Admitting: Podiatry

## 2018-02-17 ENCOUNTER — Ambulatory Visit: Payer: Medicare Other | Admitting: Podiatry

## 2018-02-17 ENCOUNTER — Encounter: Payer: Self-pay | Admitting: Podiatry

## 2018-02-17 VITALS — BP 163/89

## 2018-02-17 DIAGNOSIS — B351 Tinea unguium: Secondary | ICD-10-CM

## 2018-02-17 DIAGNOSIS — M79674 Pain in right toe(s): Secondary | ICD-10-CM | POA: Diagnosis not present

## 2018-02-17 DIAGNOSIS — E1142 Type 2 diabetes mellitus with diabetic polyneuropathy: Secondary | ICD-10-CM

## 2018-02-17 DIAGNOSIS — M79675 Pain in left toe(s): Secondary | ICD-10-CM | POA: Diagnosis not present

## 2018-02-17 NOTE — Patient Instructions (Signed)

## 2018-03-22 ENCOUNTER — Encounter: Payer: Self-pay | Admitting: Podiatry

## 2018-03-22 NOTE — Progress Notes (Signed)
Subjective: Roy [name] presents today referred by Rogers Blocker, MD with diabetes and cc of painful, discolored, thick toenails which interfere with daily activities.  Duration is about one month. Pain is aggravated when wearing enclosed shoe gear. He denies any attempt at treatment for this condition.   Past Medical History:  Diagnosis Date  . A-fib ([place])   . Arthritis HANDS/ THUMBS/ SHOULDERS  . Bladder calculi   . Chronic obstructive asthma, unspecified FOLLOWED BY DR Tarri Fuller YOUNG  VISIT 10-14-2011 IN [facility]  . COPD (chronic obstructive pulmonary disease) ([place])   . Cyst on ear    left  . Diabetes mellitus   . DVT (deep venous thrombosis) ([place])   . Dyspnea on exertion   . GERD (gastroesophageal reflux disease)   . Hematuria   . History of prostate cancer 2010-- S/P  EXTERNAL RADIATION  . Hydronephrosis, left   . Hypertension   . OSA on CPAP    wears CPAP  . Sepsis ([place])   . Wears glasses     Patient Active Problem List   Diagnosis Date Noted  . Infected cyst of skin 01/25/2016  . Abscess of external ear, left 01/11/2016  . Acute deep vein thrombosis (DVT) of femoral vein of left lower extremity ([place])   . Bacteremia due to Enterococcus 10/09/2015  . CKD (chronic kidney disease) stage 3, GFR 30-59 ml/min ([facility]) 10/06/2015  . AKI (acute kidney injury) ([place]) 10/06/2015  . Sepsis secondary to UTI ([place]) 10/06/2015  . UTI (lower urinary tract infection) 10/06/2015  . DM2 (diabetes mellitus, type 2) ([place]) 01/06/2007  . EXOGENOUS OBESITY 01/06/2007  . Obstructive sleep apnea 01/06/2007  . Essential hypertension 01/06/2007  . Chronic obstructive airway disease with asthma ([place]) 01/06/2007    Past Surgical History:  Procedure Laterality Date  . CARPAL TUNNEL RELEASE  05-28-2005   LEFT  . CYSTOSCOPY N/A 09/26/2012   Procedure: CYSTOSCOPY, clot evacuation,fulgeration of bladder, bladder biopsy, transurethreal vaporation of prostat;  Surgeon: Alexis Frock, MD;  Location: [facility]  ORS;  Service: Urology;  Laterality: N/A;  . CYSTOSCOPY W/ RETROGRADES Bilateral 10/14/2015   Procedure: CYSTOSCOPY WITH BILATERAL RETROGRADE PYELOGRAM/ FULGERATION WITH LITHOLAPAXY;  Surgeon: Festus Aloe, MD;  Location: [facility] ORS;  Service: Urology;  Laterality: Bilateral;  . CYSTOSCOPY WITH LITHOLAPAXY  10/18/2011   Procedure: CYSTOSCOPY WITH LITHOLAPAXY;  Surgeon: Hanley Ben, MD;  Location: [facility];  Service: Urology;  Laterality: N/A;  1 HOUR NO STENT H# [id number] CELL# [phone]  MEDICARE BCBS  . EAR CYST EXCISION Left 02/23/2016   Procedure: EXCISION LEFT EAR CYST;  Surgeon: Izora Gala, MD;  Location: [facility];  Service: ENT;  Laterality: Left;  . GANGLION CYST EXCISION  1988  (APPROX)   LEFT KNEE  . [place]  . TEE WITHOUT CARDIOVERSION N/A 10/11/2015   Procedure: TRANSESOPHAGEAL ECHOCARDIOGRAM (TEE);  Surgeon: Sanda Klein, MD;  Location: [facility] ENDOSCOPY;  Service: Cardiovascular;  Laterality: N/A;     Current Outpatient Medications:  .  amitriptyline (ELAVIL) 10 MG tablet, Take 10 mg by mouth at bedtime as needed for sleep. , Disp: , Rfl:  .  apixaban (ELIQUIS) 5 MG TABS tablet, Take 1 tablet (5 mg total) by mouth 2 (two) times daily., Disp: 60 tablet, Rfl: 1 .  cephALEXin (KEFLEX) 500 MG capsule, Take 1 capsule (500 mg total) by mouth 3 (three) times daily., Disp: 15 capsule, Rfl: 0 .  diltiazem (CARDIZEM CD) 240 MG 24 hr capsule, Take 240 mg by  mouth daily., Disp: , Rfl:  .  finasteride (PROSCAR) 5 MG tablet, Take 5 mg by mouth daily., Disp: , Rfl:  .  gabapentin (NEURONTIN) 100 MG capsule, Take 100 mg by mouth daily., Disp: , Rfl:  .  glimepiride (AMARYL) 4 MG tablet, Take 4 mg by mouth daily with breakfast. , Disp: , Rfl:  .  lisinopril (PRINIVIL,ZESTRIL) 20 MG tablet, Take 20 mg by mouth daily., Disp: , Rfl: 6 .  lisinopril (PRINIVIL,ZESTRIL) 40 MG tablet, Take 40 mg by mouth daily. , Disp: , Rfl:  .  methenamine (MANDELAMINE) 1 g  tablet, Take 1,000 mg by mouth 4 (four) times daily., Disp: , Rfl:  .  NON FORMULARY, 1 each by Other route See admin instructions. CPAP machine as needed during the day and nightly, Disp: , Rfl:  .  pantoprazole (PROTONIX) 40 MG tablet, Take 40 mg by mouth daily. , Disp: , Rfl:  .  polyethylene glycol (MIRALAX / GLYCOLAX) packet, Take 17 g by mouth daily as needed for moderate constipation. , Disp: , Rfl:  .  potassium chloride SA (K-DUR,KLOR-CON) 20 MEQ tablet, Take 20 mEq by mouth every Monday, Wednesday, and Friday., Disp: , Rfl:  .  predniSONE (DELTASONE) 20 MG tablet, Two daily with food, Disp: 10 tablet, Rfl: 0 .  QUEtiapine (SEROQUEL) 25 MG tablet, Take 25 mg by mouth at bedtime., Disp: , Rfl:  .  sitaGLIPtin (JANUVIA) 100 MG tablet, Take 100 mg by mouth daily. , Disp: , Rfl:  .  tamsulosin (FLOMAX) 0.4 MG CAPS capsule, Take 1 capsule (0.4 mg total) by mouth daily., Disp: 30 capsule, Rfl: 1 .  torsemide (DEMADEX) 100 MG tablet, Take 100 mg by mouth every Monday, Wednesday, and Friday., Disp: , Rfl:   No Known Allergies  Social History   Occupational History  . Occupation: Retired    Comment: formerly Public house manager  . Smoking status: Former Smoker    Packs/day: 0.50    Years: 25.00    Pack years: 12.50    Types: Cigarettes    Last attempt to quit: 03/25/1998    Years since quitting: 20.0  . Smokeless tobacco: Never Used  Substance and Sexual Activity  . Alcohol use: Yes    Comment: OCCASIONAL BEER  . Drug use: No  . Sexual activity: Not on file    Family History  Problem Relation Age of Onset  . Diabetes Father     Immunization History  Administered Date(s) Administered  . Influenza Split 12/24/2010, 12/23/2013  . Influenza Whole 02/25/2012  . Influenza, High Dose Seasonal PF 02/10/2013     Review of systems: Positive Findings in bold print.  Constitutional:  chills, fatigue, fever, sweats, weight change Communication: Optometrist, sign Chief Technology Officer, hand writing, iPad/Android device Eyes: diplopia, glare,  light sensitivity, eyeglasses, blindness Ears nose mouth throat: Hard of hearing, deaf, sign language,  vertigo,   bloody nose,  rhinitis,  cold sores, snoring Cardiovascular: HTN, edema, arrhythmia, pacemaker in place, defibrillator in place,  chest pain/tightness, chronic anticoagulation, blood clot Respiratory:  difficulty breathing, asthma, denies congestion, SOB, wheezing, cough Gastrointestinal: abdominal pain, diarrhea, nausea, vomiting, reflux Genitourinary:  nocturia,  pain on urination,  blood in urine, Foley catheter, urinary urgency Musculoskeletal: Uses mobility aid,  cramping, stiff joints, painful joints,  Skin: +changes in toenails, color change dryness, itchy skin, mole changes, or rash  Neurological: numbness in feet, paresthesias, burning in feet, fainting,  seizure, change in speech. denies headaches, memory problems/poor historian, cerebral  palsy Endocrine: diabetes, hypothyroidism, hyperthyroidism,  dry mouth, flushing, denies heat intolerance,  cold intolerance,  excessive thirst,  polyuria,  nocturia Hematological:  easy bleeding,  excessive bleeding, easy bruising, enlarged lymph nodes, on long term blood thinner Allergy/immunological:  hives, frequent infections, multiple drug allergies, seasonal allergies,  Psychiatric:  anxiety, depression, mood disorder, suicidal ideations, hallucinations   Objective: Vascular Examination: Capillary refill time immediate x 10 digits  Dorsalis pedis 1/4 b/l  Posterior tibial pulses1/4 b/l  No digital hair x 10 digits  Skin temperature gradient WNL b/l  Dermatological Examination: Skin with normal turgor, texture and tone b/l  Toenails 1-5 b/l discolored, thick, dystrophic with subungual debris and pain with palpation to nailbeds due to thickness of nails.  Musculoskeletal: Muscle strength 5/5 to all LE muscle groups  Neurological: Sensation intact  with 10 gram monofilament b/l  Vibratory sensation diminished b/l  Assessment: 1. Painful onychomycosis toenails 1-5 b/l  2. NIDDM with neuropathy  Plan: 1. Discussed diabetic foot care principles. Literature dispensed on today. 2. Toenails 1-5 b/l were debrided in length and girth without iatrogenic bleeding. 3. Patient to continue soft, supportive shoe gear 4. Patient to report any pedal injuries to medical professional immediately. 5. Follow up 3 months. Patient/POA to call should there be a concern in the interim.

## 2018-05-19 ENCOUNTER — Ambulatory Visit: Payer: Medicare Other | Admitting: Podiatry

## 2018-05-19 DIAGNOSIS — M79675 Pain in left toe(s): Secondary | ICD-10-CM | POA: Diagnosis not present

## 2018-05-19 DIAGNOSIS — E1142 Type 2 diabetes mellitus with diabetic polyneuropathy: Secondary | ICD-10-CM

## 2018-05-19 DIAGNOSIS — M79674 Pain in right toe(s): Secondary | ICD-10-CM | POA: Diagnosis not present

## 2018-05-19 DIAGNOSIS — B351 Tinea unguium: Secondary | ICD-10-CM

## 2018-05-19 NOTE — Patient Instructions (Signed)

## 2018-05-23 ENCOUNTER — Encounter: Payer: Self-pay | Admitting: Podiatry

## 2018-05-23 NOTE — Progress Notes (Signed)
Subjective: Roy [name] presents today with history of diabetic neuropathy with cc of painful, mycotic toenails.  Pain is aggravated when wearing enclosed shoe gear and relieved with periodic professional debridement.  Patient has peripheral neuropathy managed with gabapentin.  Rogers Blocker, MD is his PCP.  He remains on blood thinner, Eliquis.   Current Outpatient Medications:  .  amitriptyline (ELAVIL) 10 MG tablet, Take 10 mg by mouth at bedtime as needed for sleep. , Disp: , Rfl:  .  apixaban (ELIQUIS) 5 MG TABS tablet, Take 1 tablet (5 mg total) by mouth 2 (two) times daily., Disp: 60 tablet, Rfl: 1 .  cephALEXin (KEFLEX) 500 MG capsule, Take 1 capsule (500 mg total) by mouth 3 (three) times daily., Disp: 15 capsule, Rfl: 0 .  ciprofloxacin (CIPRO) 250 MG tablet, Take 250 mg by mouth 2 (two) times daily., Disp: , Rfl:  .  diltiazem (CARDIZEM CD) 240 MG 24 hr capsule, Take 240 mg by mouth daily., Disp: , Rfl:  .  finasteride (PROSCAR) 5 MG tablet, Take 5 mg by mouth daily., Disp: , Rfl:  .  gabapentin (NEURONTIN) 100 MG capsule, Take 100 mg by mouth daily., Disp: , Rfl:  .  glimepiride (AMARYL) 4 MG tablet, Take 4 mg by mouth daily with breakfast. , Disp: , Rfl:  .  lisinopril (PRINIVIL,ZESTRIL) 20 MG tablet, Take 20 mg by mouth daily., Disp: , Rfl: 6 .  lisinopril (PRINIVIL,ZESTRIL) 40 MG tablet, Take 40 mg by mouth daily. , Disp: , Rfl:  .  methenamine (HIPREX) 1 g tablet, TAKE 1 2 (ONE HALF) TABLET BY MOUTH TWICE DAILY, Disp: , Rfl:  .  methenamine (MANDELAMINE) 1 g tablet, Take 1,000 mg by mouth 4 (four) times daily., Disp: , Rfl:  .  NON FORMULARY, 1 each by Other route See admin instructions. CPAP machine as needed during the day and nightly, Disp: , Rfl:  .  pantoprazole (PROTONIX) 40 MG tablet, Take 40 mg by mouth daily. , Disp: , Rfl:  .  polyethylene glycol (MIRALAX / GLYCOLAX) packet, Take 17 g by mouth daily as needed for moderate constipation. , Disp: , Rfl:  .   potassium chloride SA (K-DUR,KLOR-CON) 20 MEQ tablet, Take 20 mEq by mouth every Monday, Wednesday, and Friday., Disp: , Rfl:  .  predniSONE (DELTASONE) 20 MG tablet, Two daily with food, Disp: 10 tablet, Rfl: 0 .  QUEtiapine (SEROQUEL) 25 MG tablet, Take 25 mg by mouth at bedtime., Disp: , Rfl:  .  sitaGLIPtin (JANUVIA) 100 MG tablet, Take 100 mg by mouth daily. , Disp: , Rfl:  .  tamsulosin (FLOMAX) 0.4 MG CAPS capsule, Take 1 capsule (0.4 mg total) by mouth daily., Disp: 30 capsule, Rfl: 1 .  torsemide (DEMADEX) 100 MG tablet, Take 100 mg by mouth every Monday, Wednesday, and Friday., Disp: , Rfl:   No Known Allergies  Objective:  Vascular Examination: Capillary refill time immediate x 10 digits  Dorsalis pedis and Posterior tibial pulses 1/4 b/l  Digital hair x 10 digits was absent  Skin temperature gradient WNL b/l  Dermatological Examination: Skin with normal turgor, texture and tone b/l  Toenails 1-5 b/l discolored, thick, dystrophic with subungual debris and pain with palpation to nailbeds due to thickness of nails.  Musculoskeletal: Muscle strength 5/5 to all muscle groups b/l  Neurological: Sensation with 10 gram monofilament is intact b/l.  Vibratory sensation absent b/l.  Assessment: 1. Painful onychomycosis toenails 1-5 b/l 2. NIDDM with neuropathy   Plan:  1. Toenails 1-5 b/l were debrided in length and girth without iatrogenic bleeding. 2. Patient to continue soft, supportive shoe gear 3. Patient to report any pedal injuries to medical professional  4. Follow up 10 weeks.  5. Patient/POA to call should there be a concern in the interim.

## 2018-07-28 ENCOUNTER — Ambulatory Visit: Payer: Medicare Other | Admitting: Podiatry

## 2018-08-04 ENCOUNTER — Encounter: Payer: Self-pay | Admitting: Internal Medicine

## 2018-08-12 ENCOUNTER — Ambulatory Visit: Payer: Medicare Other | Admitting: Podiatry

## 2018-08-12 ENCOUNTER — Other Ambulatory Visit: Payer: Self-pay

## 2018-08-12 ENCOUNTER — Encounter: Payer: Self-pay | Admitting: Podiatry

## 2018-08-12 VITALS — Temp 96.6°F

## 2018-08-12 DIAGNOSIS — E1142 Type 2 diabetes mellitus with diabetic polyneuropathy: Secondary | ICD-10-CM | POA: Diagnosis not present

## 2018-08-12 DIAGNOSIS — M79674 Pain in right toe(s): Secondary | ICD-10-CM | POA: Diagnosis not present

## 2018-08-12 DIAGNOSIS — B351 Tinea unguium: Secondary | ICD-10-CM

## 2018-08-12 DIAGNOSIS — M79675 Pain in left toe(s): Secondary | ICD-10-CM

## 2018-08-23 ENCOUNTER — Encounter: Payer: Self-pay | Admitting: Podiatry

## 2018-08-23 NOTE — Progress Notes (Signed)
Subjective: Roy [name] presents today with history of neuropathy. Patient seen for follow up of chronic, painful mycotic toenails which interfere with daily activities and routine tasks.  Pain is aggravated when wearing enclosed shoe gear. Pain is getting progressively worse and relieved with periodic professional debridement.   He remains on blood thinner, Eliquis.  Rogers Blocker, MD is his PCP.  He voices no new pedal concerns on today's visit.   Current Outpatient Medications:  .  amitriptyline (ELAVIL) 10 MG tablet, Take 10 mg by mouth at bedtime as needed for sleep. , Disp: , Rfl:  .  apixaban (ELIQUIS) 5 MG TABS tablet, Take 1 tablet (5 mg total) by mouth 2 (two) times daily., Disp: 60 tablet, Rfl: 1 .  cephALEXin (KEFLEX) 500 MG capsule, Take 1 capsule (500 mg total) by mouth 3 (three) times daily., Disp: 15 capsule, Rfl: 0 .  ciprofloxacin (CIPRO) 250 MG tablet, Take 250 mg by mouth 2 (two) times daily., Disp: , Rfl:  .  diltiazem (CARDIZEM CD) 240 MG 24 hr capsule, Take 240 mg by mouth daily., Disp: , Rfl:  .  finasteride (PROSCAR) 5 MG tablet, Take 5 mg by mouth daily., Disp: , Rfl:  .  gabapentin (NEURONTIN) 100 MG capsule, Take 100 mg by mouth daily., Disp: , Rfl:  .  glimepiride (AMARYL) 4 MG tablet, Take 4 mg by mouth daily with breakfast. , Disp: , Rfl:  .  lisinopril (PRINIVIL,ZESTRIL) 20 MG tablet, Take 20 mg by mouth daily., Disp: , Rfl: 6 .  lisinopril (PRINIVIL,ZESTRIL) 40 MG tablet, Take 40 mg by mouth daily. , Disp: , Rfl:  .  methenamine (HIPREX) 1 g tablet, TAKE 1 2 (ONE HALF) TABLET BY MOUTH TWICE DAILY, Disp: , Rfl:  .  methenamine (MANDELAMINE) 1 g tablet, Take 1,000 mg by mouth 4 (four) times daily., Disp: , Rfl:  .  NON FORMULARY, 1 each by Other route See admin instructions. CPAP machine as needed during the day and nightly, Disp: , Rfl:  .  pantoprazole (PROTONIX) 40 MG tablet, Take 40 mg by mouth daily. , Disp: , Rfl:  .  polyethylene glycol (MIRALAX /  GLYCOLAX) packet, Take 17 g by mouth daily as needed for moderate constipation. , Disp: , Rfl:  .  potassium chloride SA (K-DUR,KLOR-CON) 20 MEQ tablet, Take 20 mEq by mouth every Monday, Wednesday, and Friday., Disp: , Rfl:  .  predniSONE (DELTASONE) 20 MG tablet, Two daily with food, Disp: 10 tablet, Rfl: 0 .  QUEtiapine (SEROQUEL) 25 MG tablet, Take 25 mg by mouth at bedtime., Disp: , Rfl:  .  sitaGLIPtin (JANUVIA) 100 MG tablet, Take 100 mg by mouth daily. , Disp: , Rfl:  .  tamsulosin (FLOMAX) 0.4 MG CAPS capsule, Take 1 capsule (0.4 mg total) by mouth daily., Disp: 30 capsule, Rfl: 1 .  torsemide (DEMADEX) 100 MG tablet, Take 100 mg by mouth every Monday, Wednesday, and Friday., Disp: , Rfl:   No Known Allergies  Objective: Vitals:   08/12/18 1609  Temp: (!) 96.6 F (35.9 C)    Vascular Examination: Capillary refill time immediate x 10 digits.  Dorsalis pedis pulses 1/4 b/l.  Posterior tibial pulses 1/4 b/l.  No digital hair x 10 digits.  Skin temperature gradient WNL b/l.  Dermatological Examination: Skin with normal turgor, texture and tone b/l.  Toenails 1-5 b/l discolored, thick, dystrophic with subungual debris and pain with palpation to nailbeds due to thickness of nails.  Musculoskeletal: Muscle strength 5/5  to all LE muscle groups.  Neurological: Sensation intact with 10 gram monofilament.  Vibratory sensation absent b/l.  Assessment: 1. Painful onychomycosis toenails 1-5 b/l 2. NIDDM with neuropathy  Plan: 1. Toenails 1-5 b/l were debrided in length and girth without iatrogenic bleeding. 2. Patient to continue soft, supportive shoe gear daily. 3. Patient to report any pedal injuries to medical professional immediately. 4. Follow up 10 weeks. 5. Patient/POA to call should there be a concern in the interim.

## 2018-10-21 ENCOUNTER — Other Ambulatory Visit: Payer: Self-pay

## 2018-10-21 ENCOUNTER — Ambulatory Visit: Payer: Medicare Other | Admitting: Podiatry

## 2018-10-21 ENCOUNTER — Encounter: Payer: Self-pay | Admitting: Podiatry

## 2018-10-21 VITALS — Temp 97.3°F

## 2018-10-21 DIAGNOSIS — B351 Tinea unguium: Secondary | ICD-10-CM

## 2018-10-21 DIAGNOSIS — M79674 Pain in right toe(s): Secondary | ICD-10-CM

## 2018-10-21 DIAGNOSIS — M79675 Pain in left toe(s): Secondary | ICD-10-CM | POA: Diagnosis not present

## 2018-10-21 NOTE — Patient Instructions (Addendum)

## 2018-10-23 NOTE — Progress Notes (Signed)
Subjective:  Roy [name] presents to clinic today with cc of  painful, thick, discolored, elongated toenails 1-5 b/l that become tender and cannot cut because of thickness.  Pain is aggravated when wearing enclosed shoe gear.  Rogers Blocker, MD is his PCP.   Current Outpatient Medications:  .  amitriptyline (ELAVIL) 10 MG tablet, Take 10 mg by mouth at bedtime as needed for sleep. , Disp: , Rfl:  .  apixaban (ELIQUIS) 5 MG TABS tablet, Take 1 tablet (5 mg total) by mouth 2 (two) times daily., Disp: 60 tablet, Rfl: 1 .  cephALEXin (KEFLEX) 500 MG capsule, Take 1 capsule (500 mg total) by mouth 3 (three) times daily., Disp: 15 capsule, Rfl: 0 .  ciprofloxacin (CIPRO) 250 MG tablet, Take 250 mg by mouth 2 (two) times daily., Disp: , Rfl:  .  diltiazem (CARDIZEM CD) 240 MG 24 hr capsule, Take 240 mg by mouth daily., Disp: , Rfl:  .  finasteride (PROSCAR) 5 MG tablet, Take 5 mg by mouth daily., Disp: , Rfl:  .  gabapentin (NEURONTIN) 100 MG capsule, Take 100 mg by mouth daily., Disp: , Rfl:  .  glimepiride (AMARYL) 4 MG tablet, Take 4 mg by mouth daily with breakfast. , Disp: , Rfl:  .  lisinopril (PRINIVIL,ZESTRIL) 20 MG tablet, Take 20 mg by mouth daily., Disp: , Rfl: 6 .  lisinopril (PRINIVIL,ZESTRIL) 40 MG tablet, Take 40 mg by mouth daily. , Disp: , Rfl:  .  methenamine (HIPREX) 1 g tablet, TAKE 1 2 (ONE HALF) TABLET BY MOUTH TWICE DAILY, Disp: , Rfl:  .  methenamine (MANDELAMINE) 1 g tablet, Take 1,000 mg by mouth 4 (four) times daily., Disp: , Rfl:  .  NON FORMULARY, 1 each by Other route See admin instructions. CPAP machine as needed during the day and nightly, Disp: , Rfl:  .  pantoprazole (PROTONIX) 40 MG tablet, Take 40 mg by mouth daily. , Disp: , Rfl:  .  polyethylene glycol (MIRALAX / GLYCOLAX) packet, Take 17 g by mouth daily as needed for moderate constipation. , Disp: , Rfl:  .  potassium chloride SA (K-DUR,KLOR-CON) 20 MEQ tablet, Take 20 mEq by mouth every Monday, Wednesday,  and Friday., Disp: , Rfl:  .  predniSONE (DELTASONE) 20 MG tablet, Two daily with food, Disp: 10 tablet, Rfl: 0 .  QUEtiapine (SEROQUEL) 25 MG tablet, Take 25 mg by mouth at bedtime., Disp: , Rfl:  .  sitaGLIPtin (JANUVIA) 100 MG tablet, Take 100 mg by mouth daily. , Disp: , Rfl:  .  tamsulosin (FLOMAX) 0.4 MG CAPS capsule, Take 1 capsule (0.4 mg total) by mouth daily., Disp: 30 capsule, Rfl: 1 .  torsemide (DEMADEX) 100 MG tablet, Take 100 mg by mouth every Monday, Wednesday, and Friday., Disp: , Rfl:    No Known Allergies   Objective: Vitals:   10/21/18 1454  Temp: (!) 97.3 F (36.3 C)    Physical Examination:  Vascular Examination: Capillary refill time immediate x 10 digits.  DP/PT pulses 1/4 b/l.  Digital hair absent b/l.  No edema noted b/l.  Skin temperature gradient WNL b/l.  Dermatological Examination: Skin with normal turgor, texture and tone b/l.  No open wounds b/l.  No interdigital macerations noted b/l.  Elongated, thick, discolored brittle toenails with subungual debris and pain on dorsal palpation of nailbeds 1-5 b/l.Incurvated nailplate left great toe both borders with tenderness to palpation. No erythema, no edema, no drainage noted.  Musculoskeletal Examination: Muscle strength 5/5 to  all muscle groups b/l  No pain, crepitus or joint discomfort with active/passive ROM.  Neurological Examination: Sensation intact 5/5 b/l with 10 gram monofilament.  Vibratory sensation intact b/l.  Proprioceptive sensation intact b/l.  Assessment: Mycotic nail infection with pain 1-5 b/l  Plan: 1. Toenails 1-5 b/l were debrided in length and girth without iatrogenic laceration.  Offending nail borders debrided and curretaged left hallux. Borders cleansed with alcohol. Antibiotic ointment applied. No further treatment required by patient. 2.  Continue soft, supportive shoe gear daily. 3.  Report any pedal injuries to medical professional. 4.  Follow up 3  months. 5.  Patient/POA to call should there be a question/concern in there interim.

## 2018-12-10 ENCOUNTER — Ambulatory Visit: Payer: Medicare Other | Admitting: Internal Medicine

## 2018-12-10 ENCOUNTER — Other Ambulatory Visit: Payer: Self-pay

## 2018-12-10 ENCOUNTER — Encounter: Payer: Self-pay | Admitting: Internal Medicine

## 2018-12-10 VITALS — BP 160/70 | HR 94 | Temp 96.2°F | Ht 70.0 in | Wt 231.2 lb

## 2018-12-10 DIAGNOSIS — J449 Chronic obstructive pulmonary disease, unspecified: Secondary | ICD-10-CM | POA: Diagnosis not present

## 2018-12-10 DIAGNOSIS — G4733 Obstructive sleep apnea (adult) (pediatric): Secondary | ICD-10-CM | POA: Diagnosis not present

## 2018-12-10 NOTE — Patient Instructions (Signed)
Order- schedule PFT    Dx COPD mixed type  Order- DME [employer]- replace or service old VPAPauto machine auto 18/5, mask of choice, humidifier, supplies, Air[facility]/ card  Please call as needed

## 2018-12-10 NOTE — Progress Notes (Signed)
Subjective:    Patient ID: Roy [name], male    DOB: 1935-08-25, 83 y.o.   MRN: [id number]  HPI  male former smoker followed for OSA, asthma/COPD NPSG 04/15/01 AHI 49/hour, desaturation to 72%, body weight 260 pounds ---------------------------------------------------------------------------------  11/14/16- 83 year old male former smoker followed for OSA, asthma/COPD BiPAP Insp 17, Exp 4, Advanced> today > 18/5 FOLLOWS FOR: DME [facility]. Pt wears BiPAP nightly and nap time. Pressures okay with patient. No new supplies needed at this time  WTW on flu shot BiPAP 18/5/Advanced Download-100% compliance averaging 9 hours per night. Moderately high leak. Residual AHI 19.1/hour, obstructives and centrals.. DVT left lower leg without PE, 08/14/15  //Needs office spirometry//  12/10/2018- 83 year old male former smoker followed for OSA, asthma/COPD, complicated by Hx DVT, AFib, CKD3, DM2, HBP, Obesity, GERD,  VPAP 18/5 [facility] ------Follows for: OSA We are requesting current download but he reports using every night. Better with it, but noting daytime fatigue. Machine is giving him a motor function error message.  Denies acute medical problems, but ongoing renal dysfunction with repeated UTIs.  Not very active, some DOE. Little cough or wheeze. Not using inhalers. We will update PFT.  Had flu shot  ROS-see HPI   + = positive Constitutional:    weight loss, night sweats, fevers, chills, fatigue, lassitude. HEENT:   No-  headaches, difficulty swallowing, tooth/dental problems, sore throat,       No-  sneezing, itching, ear ache, nasal congestion, post nasal drip,  CV:  No-   chest pain, orthopnea, PND, + swelling in lower extremities, anasarca,  dizziness, palpitations Resp: No-   shortness of breath with exertion or at rest.              No-   productive cough,  No non-productive cough,  No- coughing up of blood.              No-   change in color of mucus.  No- wheezing.   Skin: No-   rash or  lesions. GI:  No-   heartburn, indigestion, abdominal pain, nausea, vomiting,  GU:  MS:  No-   joint pain or swelling.   Neuro-     nothing unusual Psych:  No- change in mood or affect. No depression or anxiety.  No memory loss.  Objective:   Physical Exam General- Alert, Oriented, Affect-appropriate, Distress- none acute,  + obese Skin- rash-none, lesions- none, excoriation- none Lymphadenopathy- none Head- atraumatic            Eyes- Gross vision intact, PERRLA, conjunctivae clear secretions            Ears- Hearing, canals            Nose- Clear, No- Septal dev, mucus, polyps, erosion, perforation             Throat- Mallampati III , mucosa clear , drainage- none, tonsils- present.  + Dental repair Neck- flexible , trachea midline, no stridor , thyroid nl, carotid no bruit Chest - symmetrical excursion , unlabored           Heart/CV- RRR , no murmur , no gallop  , no rub, nl s1 s2                           - JVD- none , edema + bilateral, stasis changes- none, varices- none           Lung- clear to P&A, wheeze- none, cough-  none , dullness-none, rub- none           Chest wall-  Abd-  Br/ Gen/ Rectal- Not done, not indicated Extrem- cyanosis- none, clubbing, none, atrophy- none, strength- nl, + cane Neuro- grossly intact to observation Assessment & Plan:

## 2018-12-10 NOTE — Assessment & Plan Note (Signed)
Limited activity. Some DOE, but not limiting since he doesn't push himself. Plan- schedule pFT

## 2018-12-10 NOTE — Assessment & Plan Note (Signed)
He feels he benefits from his PAP machine, but remains tired. We will address the error message his machine is giving, continuing VPAP 18/5. If no better then consider new titration study.

## 2019-01-04 ENCOUNTER — Ambulatory Visit: Payer: Medicare Other | Admitting: Podiatry

## 2019-01-04 ENCOUNTER — Encounter: Payer: Self-pay | Admitting: Podiatry

## 2019-01-04 ENCOUNTER — Other Ambulatory Visit: Payer: Self-pay

## 2019-01-04 DIAGNOSIS — M79675 Pain in left toe(s): Secondary | ICD-10-CM

## 2019-01-04 DIAGNOSIS — M79674 Pain in right toe(s): Secondary | ICD-10-CM

## 2019-01-04 DIAGNOSIS — B351 Tinea unguium: Secondary | ICD-10-CM

## 2019-01-04 NOTE — Patient Instructions (Signed)

## 2019-01-10 NOTE — Progress Notes (Signed)
Subjective: Roy [name] is seen today for follow up painful, elongated, thickened toenails 1-5 b/l feet that he cannot cut. Pain interferes with daily activities. Aggravating factor includes wearing enclosed shoe gear and relieved with periodic debridement.  He is diabetic and on blood thinner, Eliquis.  Current Outpatient Medications on File Prior to Visit  Medication Sig  . amitriptyline (ELAVIL) 10 MG tablet Take 10 mg by mouth at bedtime as needed for sleep.   [name] apixaban (ELIQUIS) 5 MG TABS tablet Take 1 tablet (5 mg total) by mouth 2 (two) times daily.  . calcium carbonate (OS-CAL) 600 MG TABS tablet Take by mouth.  . diltiazem (CARDIZEM CD) 240 MG 24 hr capsule Take 240 mg by mouth daily.  . finasteride (PROSCAR) 5 MG tablet Take 5 mg by mouth daily.  [name] gabapentin (NEURONTIN) 100 MG capsule Take 100 mg by mouth daily.  [name] glimepiride (AMARYL) 4 MG tablet Take 4 mg by mouth daily with breakfast.   . lisinopril (PRINIVIL,ZESTRIL) 20 MG tablet Take 20 mg by mouth daily.  [name] lisinopril (PRINIVIL,ZESTRIL) 40 MG tablet Take 40 mg by mouth daily.   . methenamine (HIPREX) 1 g tablet TAKE 1 2 (ONE HALF) TABLET BY MOUTH TWICE DAILY  . methenamine (MANDELAMINE) 1 g tablet Take 1,000 mg by mouth 4 (four) times daily.  . NON FORMULARY 1 each by Other route See admin instructions. CPAP machine as needed during the day and nightly  . pantoprazole (PROTONIX) 40 MG tablet Take 40 mg by mouth daily.   . polyethylene glycol (MIRALAX / GLYCOLAX) packet Take 17 g by mouth daily as needed for moderate constipation.   . potassium chloride SA (K-DUR,KLOR-CON) 20 MEQ tablet Take 20 mEq by mouth every Monday, Wednesday, and Friday.  . predniSONE (DELTASONE) 20 MG tablet Two daily with food  . QUEtiapine (SEROQUEL) 25 MG tablet Take 25 mg by mouth at bedtime.  . sitaGLIPtin (JANUVIA) 100 MG tablet Take 100 mg by mouth daily.   . tamsulosin (FLOMAX) 0.4 MG CAPS capsule Take 1 capsule (0.4 mg total) by mouth  daily.  [name] torsemide (DEMADEX) 100 MG tablet Take 100 mg by mouth every Monday, Wednesday, and Friday.   No current facility-administered medications on file prior to visit.      No Known Allergies   Objective:  Vascular Examination: Capillary refill time immediate x 10 digits.  Dorsalis pedis faintly palpable b/l.  Posterior tibial pulses nonpalpable b/l.  Digital hair absent b/l.  Skin temperature gradient WNL b/l.   Dermatological Examination: Skin with normal turgor, texture and tone b/l.  Toenails 1-5 b/l discolored, thick, dystrophic with subungual debris and pain with palpation to nailbeds due to thickness of nails.  Musculoskeletal: Muscle strength 5/5 to all LE muscle groups b/l.  No gross bony deformities b/l.  No pain, crepitus or joint limitation noted with ROM.   Neurological Examination: Protective sensation intact with 10 gram monofilament bilaterally.  Vibratory sensation intact bilaterally.   Assessment: Painful onychomycosis toenails 1-5 b/l  Pt on blood thinner, Eliquis  Plan: 1. Toenails 1-5 b/l were debrided in length and girth without iatrogenic bleeding. 2. Patient to continue soft, supportive shoe gear 3. Patient to report any pedal injuries to medical professional immediately. 4. Follow up 3 months.  5. Patient/POA to call should there be a concern in the interim.

## 2019-03-01 ENCOUNTER — Other Ambulatory Visit: Payer: Self-pay | Admitting: Nephrology

## 2019-03-01 DIAGNOSIS — N179 Acute kidney failure, unspecified: Secondary | ICD-10-CM

## 2019-03-01 DIAGNOSIS — N184 Chronic kidney disease, stage 4 (severe): Secondary | ICD-10-CM

## 2019-03-02 ENCOUNTER — Ambulatory Visit
Admission: RE | Admit: 2019-03-02 | Discharge: 2019-03-02 | Disposition: A | Discharge status: Home / Self Care | Payer: Medicare Other | Source: Ambulatory Visit | Attending: Nephrology | Admitting: Nephrology

## 2019-03-02 DIAGNOSIS — N179 Acute kidney failure, unspecified: Secondary | ICD-10-CM

## 2019-03-02 DIAGNOSIS — N184 Chronic kidney disease, stage 4 (severe): Secondary | ICD-10-CM

## 2019-03-04 ENCOUNTER — Other Ambulatory Visit: Payer: Medicare Other

## 2019-03-09 ENCOUNTER — Other Ambulatory Visit: Payer: Medicare Other

## 2019-04-12 ENCOUNTER — Other Ambulatory Visit: Payer: Self-pay

## 2019-04-12 ENCOUNTER — Encounter: Payer: Self-pay | Admitting: Podiatry

## 2019-04-12 ENCOUNTER — Ambulatory Visit (INDEPENDENT_AMBULATORY_CARE_PROVIDER_SITE_OTHER): Payer: Medicare (Managed Care) | Admitting: Podiatry

## 2019-04-12 DIAGNOSIS — M79675 Pain in left toe(s): Secondary | ICD-10-CM

## 2019-04-12 DIAGNOSIS — B351 Tinea unguium: Secondary | ICD-10-CM | POA: Diagnosis not present

## 2019-04-12 DIAGNOSIS — M79674 Pain in right toe(s): Secondary | ICD-10-CM | POA: Diagnosis not present

## 2019-04-17 NOTE — Progress Notes (Signed)
Subjective: Roy [name] presents today for follow up of preventative diabetic foot care: and painful mycotic nails b/l that are difficult to trim. Pain interferes with ambulation. Aggravating factors include wearing enclosed shoe gear. Pain is relieved with periodic professional debridement.   No Known Allergies   Objective: There were no vitals filed for this visit.  Vascular Examination:  capillary refill time to digits immediate b/l, non-palpable PT pulses b/l, faintly palpable DP pulses b/l, pedal hair absent b/l and skin temperature gradient within normal limits b/l  Dermatological Examination: Pedal skin with normal turgor, texture and tone bilaterally., No open wounds bilaterally. and No interdigital macerations bilaterally.  onychomycosis of the toenails b/l with elongation, dystrophy, discoloration, thickening, subungual debris and pain to palpation of nail beds  Musculoskeletal: normal muscle strength 5/5 to all lower extremity muscle groups bilaterally, no gross bony deformities bilaterally and no pain crepitus or joint limitation noted with ROM  Neurological: sensation intact 5/5 intact bilaterally with 10g monofilament  Assessment: 1. Pain due to onychomycosis of toenails of both feet      Plan: Continue diabetic foot care principles. Literature dispensed on today.  -Toenails 1-5 b/l were debrided in length and girth without iatrogenic bleeding. -Patient to continue soft, supportive shoe gear daily. -Patient to report any pedal injuries to medical professional immediately. -Patient/POA to call should there be question/concern in the interim.  Return in about 3 months (around 07/11/2019).

## 2019-07-13 ENCOUNTER — Ambulatory Visit (INDEPENDENT_AMBULATORY_CARE_PROVIDER_SITE_OTHER): Payer: BC Managed Care – PPO | Admitting: Podiatry

## 2019-07-13 ENCOUNTER — Encounter: Payer: Self-pay | Admitting: Podiatry

## 2019-07-13 ENCOUNTER — Other Ambulatory Visit: Payer: Self-pay

## 2019-07-13 DIAGNOSIS — M79675 Pain in left toe(s): Secondary | ICD-10-CM | POA: Diagnosis not present

## 2019-07-13 DIAGNOSIS — M79674 Pain in right toe(s): Secondary | ICD-10-CM

## 2019-07-13 DIAGNOSIS — E1142 Type 2 diabetes mellitus with diabetic polyneuropathy: Secondary | ICD-10-CM | POA: Diagnosis not present

## 2019-07-13 DIAGNOSIS — B351 Tinea unguium: Secondary | ICD-10-CM | POA: Diagnosis not present

## 2019-07-13 NOTE — Patient Instructions (Signed)

## 2019-07-16 NOTE — Progress Notes (Signed)
Subjective: Roy [name] presents today for follow up of at risk foot care. Pt has h/o NIDDM with chronic kidney disease and painful mycotic nails b/l that are difficult to trim. Pain interferes with ambulation. Aggravating factors include wearing enclosed shoe gear. Pain is relieved with periodic professional debridement.   No Known Allergies   Objective: There were no vitals filed for this visit.  Pt is a pleasant 84 y.o. year old AA male in NAD. AAO x 3.   Vascular Examination:  Capillary refill time to digits immediate b/l. Palpable DP pulses b/l. Nonpalpable PT pulses b/l. Pedal hair absent b/l Skin temperature gradient within normal limits b/l. +1 pitting edema b/l LE.  Dermatological Examination: Pedal skin with normal turgor, texture and tone bilaterally. No open wounds bilaterally. No interdigital macerations bilaterally. Toenails 1-5 b/l elongated, dystrophic, thickened, crumbly with subungual debris and tenderness to dorsal palpation.  Musculoskeletal: Normal muscle strength 5/5 to all lower extremity muscle groups bilaterally, no gross bony deformities bilaterally, no pain crepitus or joint limitation noted with ROM b/l and patient ambulates independent of any assistive aids  Neurological: Protective sensation intact 5/5 intact bilaterally with 10g monofilament b/l. Vibratory sensation intact b/l. Proprioception intact bilaterally. Babinski reflex negative b/l. Clonus negative b/l.  Assessment: 1. Pain due to onychomycosis of toenails of both feet   2. Diabetic peripheral neuropathy associated with type 2 diabetes mellitus ([name])    Plan: -Continue diabetic foot care principles. Literature dispensed on today.  -Toenails 1-5 b/l were debrided in length and girth with sterile nail nippers and dremel without iatrogenic bleeding.  -Patient to continue soft, supportive shoe gear daily. -Patient to report any pedal injuries to medical professional immediately. -Patient/POA to  call should there be question/concern in the interim.  Return in about 3 months (around 10/12/2019) for nail trim/ Eliquis.

## 2019-09-23 ENCOUNTER — Other Ambulatory Visit: Payer: Self-pay | Admitting: Orthopaedic Surgery

## 2019-09-23 DIAGNOSIS — M25512 Pain in left shoulder: Secondary | ICD-10-CM

## 2019-10-07 ENCOUNTER — Other Ambulatory Visit: Payer: Self-pay

## 2019-10-12 ENCOUNTER — Other Ambulatory Visit: Payer: Self-pay

## 2019-10-12 ENCOUNTER — Encounter: Payer: Self-pay | Admitting: Podiatry

## 2019-10-12 ENCOUNTER — Ambulatory Visit (INDEPENDENT_AMBULATORY_CARE_PROVIDER_SITE_OTHER): Payer: Medicare (Managed Care) | Admitting: Podiatry

## 2019-10-12 DIAGNOSIS — M79675 Pain in left toe(s): Secondary | ICD-10-CM | POA: Diagnosis not present

## 2019-10-12 DIAGNOSIS — M79674 Pain in right toe(s): Secondary | ICD-10-CM | POA: Diagnosis not present

## 2019-10-12 DIAGNOSIS — B351 Tinea unguium: Secondary | ICD-10-CM

## 2019-10-12 DIAGNOSIS — E1142 Type 2 diabetes mellitus with diabetic polyneuropathy: Secondary | ICD-10-CM

## 2019-10-15 NOTE — Progress Notes (Signed)
Subjective: Roy [name] presents today for follow up of at risk foot care. Pt has h/o NIDDM with chronic kidney disease and painful mycotic nails b/l that are difficult to trim. Pain interferes with ambulation. Aggravating factors include wearing enclosed shoe gear. Pain is relieved with periodic professional debridement.   Roy [name] voices no new pedal problems on today's visit.  No Known Allergies   Objective: There were no vitals filed for this visit.  Pt is a pleasant 84 y.o. year old AA male in NAD. AAO x 3.   Vascular Examination:  Capillary refill time to digits immediate b/l. Palpable DP pulses b/l. Nonpalpable PT pulses b/l. Pedal hair absent b/l Skin temperature gradient within normal limits b/l. +1 pitting edema b/l LE.  Dermatological Examination: Pedal skin with normal turgor, texture and tone bilaterally. No open wounds bilaterally. No interdigital macerations bilaterally. Toenails 1-5 b/l elongated, dystrophic, thickened, crumbly with subungual debris and tenderness to dorsal palpation.  Musculoskeletal: Normal muscle strength 5/5 to all lower extremity muscle groups bilaterally, no gross bony deformities bilaterally, no pain crepitus or joint limitation noted with ROM b/l and patient ambulates independent of any assistive aids  Neurological: Pt has subjective symptoms of neuropathy. Protective sensation intact 5/5 intact bilaterally with 10g monofilament b/l. Vibratory sensation intact b/l. Proprioception intact bilaterally. Babinski reflex negative b/l. Clonus negative b/l.  Assessment: 1. Pain due to onychomycosis of toenails of both feet   2. Diabetic peripheral neuropathy associated with type 2 diabetes mellitus ([place])    Plan: -Examined patient. -No new findings. No new orders. -Continue diabetic foot care principles. -Toenails 1-5 b/l were debrided in length and girth with sterile nail nippers and dremel without iatrogenic bleeding.  -Patient to report  any pedal injuries to medical professional immediately. -Patient to continue soft, supportive shoe gear daily. -Patient/POA to call should there be question/concern in the interim.  Return in about 3 months (around 01/12/2020) for diabetic nail trim.

## 2019-11-17 NOTE — Progress Notes (Signed)
Patient referred by Rogers Blocker, MD for exertional dyspnea, leg swelling.  Subjective:   Roy [name], male    DOB: 06-Mar-1936, 84 y.o.   MRN: [id number]   Chief Complaint  Patient presents with  . Leg Swelling  . Shortness of Breath  . New Patient (Initial Visit)     HPI  84 y.o. African American male with hypertension, type 2 DM, persistent Afib, OSA, CKD IIIb exertional dyspnea, leg swelling.  Patient is here with his son today.  Patient lives alone, performs all his ADLs without any difficulty.  He seems to be unaware of details of his medical history.  For example, he does not know that he has atrial fibrillation.  He believes he is on Eliquis to help his circulation.  He initially seemed to be unaware of his kidney disease, but then realizes that he is seeing a nephrologist.  He denies any chest pain or shortness of breath with his level of activity, which includes walking inside his one level house. He reports bilateral leg swelling, more towards the end of the day, improves with wearing compression stockings. Blood pressure elevated today.    Past Medical History:  Diagnosis Date  . A-fib ([place])   . Arthritis HANDS/ THUMBS/ SHOULDERS  . Bladder calculi   . Chronic obstructive asthma, unspecified FOLLOWED BY DR Tarri Fuller YOUNG  VISIT 10-14-2011 IN [facility]  . COPD (chronic obstructive pulmonary disease) ([place])   . Cyst on ear    left  . Diabetes mellitus   . DVT (deep venous thrombosis) ([place])   . Dyspnea on exertion   . GERD (gastroesophageal reflux disease)   . Hematuria   . History of prostate cancer 2010-- S/P  EXTERNAL RADIATION  . Hydronephrosis, left   . Hypertension   . OSA on CPAP    wears CPAP  . Sepsis ([place])   . Wears glasses      Past Surgical History:  Procedure Laterality Date  . CARPAL TUNNEL RELEASE  05-28-2005   LEFT  . CYSTOSCOPY N/A 09/26/2012   Procedure: CYSTOSCOPY, clot evacuation,fulgeration of bladder, bladder biopsy, transurethreal  vaporation of prostat;  Surgeon: Alexis Frock, MD;  Location: [facility];  Service: Urology;  Laterality: N/A;  . CYSTOSCOPY W/ RETROGRADES Bilateral 10/14/2015   Procedure: CYSTOSCOPY WITH BILATERAL RETROGRADE PYELOGRAM/ FULGERATION WITH LITHOLAPAXY;  Surgeon: Festus Aloe, MD;  Location: [facility];  Service: Urology;  Laterality: Bilateral;  . CYSTOSCOPY WITH LITHOLAPAXY  10/18/2011   Procedure: CYSTOSCOPY WITH LITHOLAPAXY;  Surgeon: Hanley Ben, MD;  Location: [facility];  Service: Urology;  Laterality: N/A;  1 HOUR NO STENT H# [id number] CELL# [id number]  MEDICARE BCBS  . EAR CYST EXCISION Left 02/23/2016   Procedure: EXCISION LEFT EAR CYST;  Surgeon: Izora Gala, MD;  Location: [facility];  Service: ENT;  Laterality: Left;  . GANGLION CYST EXCISION  1988  (APPROX)   LEFT KNEE  . [place]  . TEE WITHOUT CARDIOVERSION N/A 10/11/2015   Procedure: TRANSESOPHAGEAL ECHOCARDIOGRAM (TEE);  Surgeon: Sanda Klein, MD;  Location: [facility];  Service: Cardiovascular;  Laterality: N/A;     Social History   Tobacco Use  Smoking Status Former Smoker  . Packs/day: 0.50  . Years: 25.00  . Pack years: 12.50  . Types: Cigarettes  . Quit date: 03/25/1998  . Years since quitting: 21.6  Smokeless Tobacco Never Used    Social History   Substance and Sexual Activity  Alcohol Use Yes  Comment: OCCASIONAL BEER     Family History  Problem Relation Age of Onset  . Diabetes Father      Current Outpatient Medications on File Prior to Visit  Medication Sig Dispense Refill  . apixaban (ELIQUIS) 5 MG TABS tablet Take 1 tablet (5 mg total) by mouth 2 (two) times daily. 60 tablet 1  . cinacalcet (SENSIPAR) 30 MG tablet Take 30 mg by mouth daily.    [name] diltiazem (CARDIZEM CD) 240 MG 24 hr capsule Take 240 mg by mouth daily.    . finasteride (PROSCAR) 5 MG tablet Take 5 mg by mouth daily.    [name] gabapentin (NEURONTIN) 100 MG capsule Take 100 mg by mouth daily.    .  methenamine (HIPREX) 1 g tablet TAKE 1 2 (ONE HALF) TABLET BY MOUTH TWICE DAILY    . pantoprazole (PROTONIX) 40 MG tablet Take 40 mg by mouth daily.     . potassium chloride SA (K-DUR,KLOR-CON) 20 MEQ tablet Take 20 mEq by mouth every Monday, Wednesday, and Friday.    [name] QUEtiapine (SEROQUEL) 25 MG tablet Take 50 mg by mouth at bedtime.     . tamsulosin (FLOMAX) 0.4 MG CAPS capsule Take 1 capsule (0.4 mg total) by mouth daily. 30 capsule 1  . torsemide (DEMADEX) 100 MG tablet Take 100 mg by mouth every Monday, Wednesday, and Friday.     No current facility-administered medications on file prior to visit.    Cardiovascular and other pertinent studies:  EKG 11/18/2019: Atrial fibrillation 79 bpm   Recent labs: 08/26/2019: Glucose 94, BUN/Cr 17/1.73. EGFR 42. Na/K 147/5.8. Protein, Total: 5.4, Albumin: 3, ALT: 7 H/H 10.7/32.9. MCV 93.5. Platelets 171 HbA1C 5.5 % Chol 177, TG 89, HDL 54, LDL 105 BNP 132 NT pro BNP 1057   Review of Systems  Cardiovascular: Positive for leg swelling. Negative for chest pain, dyspnea on exertion, palpitations and syncope.         Vitals:   11/18/19 1002 11/18/19 1018  BP: (!) 196/97 (!) 190/89  Pulse: 74 65  Resp: 16   SpO2: 99%      Body mass index is 33 kg/m. Filed Weights   11/18/19 1002  Weight: 230 lb (104.3 kg)     Objective:   Physical Exam Vitals and nursing note reviewed.  Constitutional:      General: He is not in acute distress. HENT:     Head: Contusion present.     Comments: Dysarthria. No facial asymmetry. Has new denture.  Neck:     Vascular: No JVD.  Cardiovascular:     Rate and Rhythm: Normal rate. Rhythm irregularly irregular.     Heart sounds: Normal heart sounds. No murmur heard.   Pulmonary:     Effort: Pulmonary effort is normal.     Breath sounds: Normal breath sounds. No wheezing or rales.         Assessment & Recommendations:   84 y.o. African American male with hypertension, type 2 DM,  persistent Afib, OSA, CKD IIIb exertional dyspnea, leg swelling.  Leg swelling: Likely multifactorial, including uncontrolled hypertension, CKD III, possible systolic/diastolic dysfunction. Started Bidil 20-37.5 mg tid Conitnue wearing compression stockings. Will check echocardiogram.  Afib: Likely persistent. No symptoms s/o ischemia. Most likely due to hypertension and OSA,  CHA2DS2VASc score 3, annual stroke risk 3.2%.  Thank you for referring the patient to [place]. Please feel free to contact with any questions.   [name], MD Pager: [phone] Office: [phone]

## 2019-11-18 ENCOUNTER — Ambulatory Visit: Payer: Medicare (Managed Care) | Admitting: Cardiology

## 2019-11-18 ENCOUNTER — Other Ambulatory Visit: Payer: Self-pay

## 2019-11-18 ENCOUNTER — Encounter: Payer: Self-pay | Admitting: Cardiology

## 2019-11-18 VITALS — BP 190/89 | HR 65 | Resp 16 | Ht 70.0 in | Wt 230.0 lb

## 2019-11-18 DIAGNOSIS — R0609 Other forms of dyspnea: Secondary | ICD-10-CM | POA: Insufficient documentation

## 2019-11-18 DIAGNOSIS — I10 Essential (primary) hypertension: Secondary | ICD-10-CM

## 2019-11-18 DIAGNOSIS — R600 Localized edema: Secondary | ICD-10-CM | POA: Insufficient documentation

## 2019-11-18 DIAGNOSIS — Z9989 Dependence on other enabling machines and devices: Secondary | ICD-10-CM

## 2019-11-18 MED ORDER — BIDIL 20-37.5 MG PO TABS: 1.0000 | ORAL_TABLET | Freq: Three times a day (TID) | ORAL | 3 refills | Status: DC

## 2019-11-19 LAB — BASIC METABOLIC PANEL
BUN/Creatinine Ratio: 9 — ABNORMAL LOW (ref 10–24)
BUN: 17 mg/dL (ref 8–27)
CO2: 24 mmol/L (ref 20–29)
Calcium: 9.8 mg/dL (ref 8.6–10.2)
Chloride: 113 mmol/L — ABNORMAL HIGH (ref 96–106)
Creatinine, Ser: 1.86 mg/dL — ABNORMAL HIGH (ref 0.76–1.27)
GFR calc Af Amer: 38 mL/min/{1.73_m2} — ABNORMAL LOW (ref >59)
GFR calc non Af Amer: 33 mL/min/{1.73_m2} — ABNORMAL LOW (ref >59)
Glucose: 98 mg/dL (ref 65–99)
Potassium: 5.1 mmol/L (ref 3.5–5.2)
Sodium: 147 mmol/L — ABNORMAL HIGH (ref 134–144)

## 2019-11-19 NOTE — Progress Notes (Signed)
Kidney function is stable. Potassium has improved.

## 2019-11-19 NOTE — Progress Notes (Signed)
Spoke with patient, patient voiced understanding.

## 2019-12-10 ENCOUNTER — Ambulatory Visit: Payer: Medicare Other | Admitting: Internal Medicine

## 2019-12-10 ENCOUNTER — Ambulatory Visit: Payer: Self-pay | Admitting: Internal Medicine

## 2019-12-10 ENCOUNTER — Telehealth: Payer: Self-pay | Admitting: Internal Medicine

## 2019-12-10 ENCOUNTER — Other Ambulatory Visit: Payer: Self-pay

## 2019-12-10 DIAGNOSIS — J449 Chronic obstructive pulmonary disease, unspecified: Secondary | ICD-10-CM

## 2019-12-10 NOTE — Telephone Encounter (Signed)
Let patient know I had put a new order in to re-schedule his PFT and get COVID vaccine . Patient's voice was understanding. Nothing else further needed.

## 2019-12-13 ENCOUNTER — Telehealth: Payer: Self-pay | Admitting: Pharmacist

## 2019-12-13 ENCOUNTER — Ambulatory Visit: Payer: Medicare (Managed Care)

## 2019-12-13 ENCOUNTER — Other Ambulatory Visit: Payer: Self-pay | Admitting: Pharmacist

## 2019-12-13 ENCOUNTER — Other Ambulatory Visit: Payer: Self-pay

## 2019-12-13 DIAGNOSIS — I10 Essential (primary) hypertension: Secondary | ICD-10-CM

## 2019-12-13 DIAGNOSIS — R600 Localized edema: Secondary | ICD-10-CM

## 2019-12-13 MED ORDER — HYDRALAZINE HCL 50 MG PO TABS: 50.0000 mg | ORAL_TABLET | Freq: Three times a day (TID) | ORAL | 2 refills | Status: DC

## 2019-12-13 MED ORDER — ISOSORBIDE DINITRATE 30 MG PO TABS: 30.0000 mg | ORAL_TABLET | Freq: Three times a day (TID) | ORAL | 2 refills | Status: DC

## 2019-12-13 NOTE — Telephone Encounter (Signed)
Med list reviewed and updated. Remote BP readings over the past month of 157/83, HR:67. Pt noted to have ran out of his sample BiDil supply. Pt doesn't remember when his last dose was. Reports to be adherent to all of his other medicaitons. Called to request BiDil refill from pt's pharmacy. Per pharmacist, BiDil not covered by pt's insurance. Reviewed with Dr. Virgina Jock. Per Dr. Virgina Jock, okay to split BiDil into individual components for insurance reasons. Rx pended for review and approval. Will continue to monitor and follow up as needed.

## 2019-12-17 ENCOUNTER — Encounter: Payer: Self-pay | Admitting: Cardiology

## 2019-12-17 ENCOUNTER — Ambulatory Visit: Payer: Medicare (Managed Care) | Admitting: Cardiology

## 2019-12-17 ENCOUNTER — Other Ambulatory Visit: Payer: Self-pay

## 2019-12-17 VITALS — BP 148/59 | HR 55 | Resp 17 | Ht 70.0 in | Wt 218.0 lb

## 2019-12-17 DIAGNOSIS — I10 Essential (primary) hypertension: Secondary | ICD-10-CM

## 2019-12-17 DIAGNOSIS — G4733 Obstructive sleep apnea (adult) (pediatric): Secondary | ICD-10-CM

## 2019-12-17 DIAGNOSIS — I2729 Other secondary pulmonary hypertension: Secondary | ICD-10-CM | POA: Insufficient documentation

## 2019-12-17 DIAGNOSIS — I4819 Other persistent atrial fibrillation: Secondary | ICD-10-CM

## 2019-12-17 DIAGNOSIS — I272 Pulmonary hypertension, unspecified: Secondary | ICD-10-CM

## 2019-12-17 NOTE — Progress Notes (Signed)
Patient referred by Rogers Blocker, MD for exertional dyspnea, leg swelling.  Subjective:   Roy [name], male    DOB: 04/19/1935, 84 y.o.   MRN: [id number]   Chief Complaint  Patient presents with  . Hypertension  . Shortness of Breath  . Follow-up    4 week     HPI  84 y.o. African American male with hypertension, type 2 DM, persistent Afib, OSA, CKD IIIb exertional dyspnea, leg swelling.  Home BP monitoring shows improved numbers, BP 149/78 mmHg, HR 61/min. Echocardiogram reviewed with the patient, details below.   Initial consultation HPI 10/2019: Patient is here with his son today.  Patient lives alone, performs all his ADLs without any difficulty.  He seems to be unaware of details of his medical history.  For example, he does not know that he has atrial fibrillation.  He believes he is on Eliquis to help his circulation.  He initially seemed to be unaware of his kidney disease, but then realizes that he is seeing a nephrologist.  He denies any chest pain or shortness of breath with his level of activity, which includes walking inside his one level house. He reports bilateral leg swelling, more towards the end of the day, improves with wearing compression stockings. Blood pressure elevated today.    Current Outpatient Medications on File Prior to Visit  Medication Sig Dispense Refill  . apixaban (ELIQUIS) 5 MG TABS tablet Take 1 tablet (5 mg total) by mouth 2 (two) times daily. 60 tablet 1  . cinacalcet (SENSIPAR) 30 MG tablet Take 30 mg by mouth daily.    . CONTOUR NEXT TEST test strip daily.    [name] diltiazem (CARDIZEM CD) 240 MG 24 hr capsule Take 240 mg by mouth daily.    . finasteride (PROSCAR) 5 MG tablet Take 5 mg by mouth daily.    [name] gabapentin (NEURONTIN) 100 MG capsule Take 100 mg by mouth daily.    . hydrALAZINE (APRESOLINE) 50 MG tablet Take 1 tablet (50 mg total) by mouth 3 (three) times daily. 90 tablet 2  . isosorbide dinitrate (ISORDIL) 30 MG tablet Take 1  tablet (30 mg total) by mouth 3 (three) times daily. 90 tablet 2  . methenamine (HIPREX) 1 g tablet Take 500 mg by mouth 2 (two) times daily with a meal.     . Microlet Lancets MISC USE TO CHECK GLUCOSE ONE TIME A DAY AS DIRECTED    . pantoprazole (PROTONIX) 40 MG tablet Take 40 mg by mouth daily.     . potassium chloride SA (K-DUR,KLOR-CON) 20 MEQ tablet Take 20 mEq by mouth every Monday, Wednesday, and Friday.    [name] QUEtiapine (SEROQUEL) 50 MG tablet Take 50 mg by mouth at bedtime.     . tamsulosin (FLOMAX) 0.4 MG CAPS capsule Take 1 capsule (0.4 mg total) by mouth daily. 30 capsule 1  . torsemide (DEMADEX) 100 MG tablet Take 50 mg by mouth every Monday, Wednesday, and Friday.      No current facility-administered medications on file prior to visit.    Cardiovascular and other pertinent studies:  EKG 11/18/2019: Atrial fibrillation 79 bpm  Echocardiogram 12/13/2019:  Left ventricle cavity is normal in size. Moderate concentric hypertrophy  of the left ventricle. Normal global wall motion. Normal LV systolic  function with visual EF 50-55%. Doppler evidence of grade II  (pseudonormal) diastolic dysfunction, elevated LAP.  Left atrial cavity is severely dilated.  Right atrial cavity is mildly dilated.  Moderate  calcification of the mitral valve annulus. Moderate (Grade III)  mitral regurgitation.  Moderate tricuspid regurgitation. Mild pulmonary hypertension. Estimated  pulmonary artery systolic pressure 50 mmHg.  Mild pulmonic regurgitation.  Trace pericardial effusion.  Recent labs: 11/18/2019: Glucose 98, BUN/Cr 17/1.86. EGFR 38. Na/K 147/5.1.   08/26/2019: Glucose 94, BUN/Cr 17/1.73. EGFR 42. Na/K 147/5.8. Protein, Total: 5.4, Albumin: 3 H/H 10.7/32.9. MCV 93.5. Platelets 171 HbA1C 5.5 % Chol 177, TG 89, HDL 54, LDL 105 BNP 132 NT pro BNP 1057   Review of Systems  Cardiovascular: Positive for leg swelling. Negative for chest pain, dyspnea on exertion, palpitations and  syncope.         Vitals:   12/17/19 1103  BP: (!) 148/59  Pulse: (!) 55  Resp: 17  SpO2: 97%     Body mass index is 31.28 kg/m. Filed Weights   12/17/19 1103  Weight: 218 lb (98.9 kg)     Objective:   Physical Exam Vitals and nursing note reviewed.  Constitutional:      General: He is not in acute distress. HENT:     Head:     Comments: Dysarthria. No facial asymmetry. Has new denture.  Neck:     Vascular: No JVD.  Cardiovascular:     Rate and Rhythm: Normal rate. Rhythm irregularly irregular.     Heart sounds: Normal heart sounds. No murmur heard.   Pulmonary:     Effort: Pulmonary effort is normal.     Breath sounds: Normal breath sounds. No wheezing or rales.  Musculoskeletal:     Right lower leg: Edema (1+) present.     Left lower leg: Edema (1+) present.         Assessment & Recommendations:   84 y.o. African American male with hypertension, type 2 DM, persistent Afib, OSA, CKD IIIb, pulmonary hypertension, exertional dyspnea, leg swelling.  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 Continue medical management of hypertension. Continue OSA for CPAP.   Afib: Likely persistent. No symptoms s/o ischemia. Most likely due to hypertension and OSA,  CHA2DS2VASc score 3, annual stroke risk 3.2%.  F/u in 6 months   [name], MD Pager: [phone] Office: [phone]

## 2020-01-12 ENCOUNTER — Ambulatory Visit (INDEPENDENT_AMBULATORY_CARE_PROVIDER_SITE_OTHER): Payer: Medicare (Managed Care) | Admitting: Podiatry

## 2020-01-12 ENCOUNTER — Other Ambulatory Visit: Payer: Self-pay

## 2020-01-12 ENCOUNTER — Encounter: Payer: Self-pay | Admitting: Podiatry

## 2020-01-12 DIAGNOSIS — E1142 Type 2 diabetes mellitus with diabetic polyneuropathy: Secondary | ICD-10-CM

## 2020-01-12 DIAGNOSIS — M79674 Pain in right toe(s): Secondary | ICD-10-CM

## 2020-01-12 DIAGNOSIS — B351 Tinea unguium: Secondary | ICD-10-CM

## 2020-01-12 DIAGNOSIS — M79675 Pain in left toe(s): Secondary | ICD-10-CM

## 2020-01-16 NOTE — Progress Notes (Addendum)
Subjective: Roy [name] presents today for follow up of at risk foot care. Pt has h/o NIDDM with chronic kidney disease and painful mycotic nails b/l that are difficult to trim. Pain interferes with ambulation. Aggravating factors include wearing enclosed shoe gear. Pain is relieved with periodic professional debridement.   He states blood glucose was 131 mg/dl today.  Roy [name] voices no new pedal problems on today's visit.  No Known Allergies   Objective: There were no vitals filed for this visit.  Pt is a pleasant 84 y.o. year old AA male in NAD. AAO x 3.   Vascular Examination:  Capillary refill time to digits immediate b/l. Palpable DP pulses b/l. Nonpalpable PT pulses b/l. Pedal hair absent b/l Skin temperature gradient within normal limits b/l. +1 pitting edema b/l LE.  Dermatological Examination: Pedal skin with normal turgor, texture and tone bilaterally. No open wounds bilaterally. No interdigital macerations bilaterally. Toenails 1-5 b/l elongated, dystrophic, thickened, crumbly with subungual debris and tenderness to dorsal palpation.  Musculoskeletal: Normal muscle strength 5/5 to all lower extremity muscle groups bilaterally, no gross bony deformities bilaterally, no pain crepitus or joint limitation noted with ROM b/l and patient ambulates independent of any assistive aids  Neurological: Pt has subjective symptoms of neuropathy. Protective sensation intact 5/5 intact bilaterally with 10g monofilament b/l. Vibratory sensation intact b/l. Proprioception intact bilaterally. Babinski reflex negative b/l. Clonus negative b/l.  Assessment: 1. Pain due to onychomycosis of toenails of both feet   2. Diabetic peripheral neuropathy associated with type 2 diabetes mellitus ([place])    Plan: -Examined patient. -No new findings. No new orders. -Continue diabetic foot care principles. -Toenails 1-5 b/l were debrided in length and girth with sterile nail nippers and dremel  without iatrogenic bleeding.  -Patient to report any pedal injuries to medical professional immediately. -Patient to continue soft, supportive shoe gear daily. -Patient/POA to call should there be question/concern in the interim.  Return in about 3 months (around 04/13/2020).

## 2020-02-27 ENCOUNTER — Encounter: Payer: Self-pay | Admitting: Internal Medicine

## 2020-02-28 NOTE — Progress Notes (Signed)
Subjective:    Patient ID: Roy [name], male    DOB: 08/14/1935, 84 y.o.   MRN: [id number]  HPI  male former smoker followed for OSA, asthma/COPD NPSG 04/15/01 AHI 49/hour, desaturation to 72%, body weight 260 pounds ---------------------------------------------------------------------------------   12/10/2018- 84 year old male former smoker followed for OSA, asthma/COPD, complicated by Hx DVT, AFib, CKD3, DM2, HBP, Obesity, GERD,  VPAP 18/5 [employer] ------Follows for: OSA We are requesting current download but he reports using every night. Better with it, but noting daytime fatigue. Machine is giving him a motor function error message.  Denies acute medical problems, but ongoing renal dysfunction with repeated UTIs.  Not very active, some DOE. Little cough or wheeze. Not using inhalers. We will update PFT.  Had flu shot  02/29/20-  83 year old male former smoker followed for OSA, asthma/COPD, complicated by Hx DVT, AFib, CKD3, DM2, HBP, Obesity, GERD,  VPAP 18/5 [employer] Download- compliance 100%, AHI 12.7/ hr Body weight today- Covid vax- 3 Moderna Flu vax-had -----PFT, COPD,OSA,denies bipap problems, short of breath with exertion Doing ok with his BIPAP. Discussed higher than desired AHI. We will raise pressure.  No acute issues with breathing. Little cough or wheeze. Stable DOE. Agrees to try Trelegy.  PFT 02/29/20-  Mild restriction, mild obstruction w/o resp to BD, DLCO mildly reduced. ECHO 12/13/19- EF 50-55%, LAE, mitral regurg,  Mild PAH 50 mmHg, Gr2DD  ROS-see HPI   + = positive Constitutional:    weight loss, night sweats, fevers, chills, fatigue, lassitude. HEENT:   No-  headaches, difficulty swallowing, tooth/dental problems, sore throat,       No-  sneezing, itching, ear ache, nasal congestion, post nasal drip,  CV:  No-   chest pain, orthopnea, PND, + swelling in lower extremities, anasarca,  dizziness, palpitations Resp: + shortness of breath with exertion or at rest.               No-   productive cough,  No non-productive cough,  No- coughing up of blood.              No-   change in color of mucus.  No- wheezing.   Skin: No-   rash or lesions. GI:  No-   heartburn, indigestion, abdominal pain, nausea, vomiting,  GU:  MS:  No-   joint pain or swelling.   Neuro-     nothing unusual Psych:  No- change in mood or affect. No depression or anxiety.  No memory loss.  Objective:   Physical Exam General- Alert, Oriented, Affect-appropriate, Distress- none acute,  + overweight Skin- rash-none, lesions- none, excoriation- none Lymphadenopathy- none Head- atraumatic            Eyes- Gross vision intact, PERRLA, conjunctivae clear secretions            Ears- Hearing, canals            Nose- Clear, No- Septal dev, mucus, polyps, erosion, perforation             Throat- Mallampati III , mucosa clear , drainage- none, tonsils- present.  + Dental repair Neck- flexible , trachea midline, no stridor , thyroid nl, carotid no bruit Chest - symmetrical excursion , unlabored           Heart/CV- RRR , no murmur , no gallop  , no rub, nl s1 s2                           -  JVD- none , edema- nonel, stasis changes- none, varices- none           Lung- clear to P&A, wheeze- none, cough- none , dullness-none, rub- none           Chest wall-  Abd-  Br/ Gen/ Rectal- Not done, not indicated Extrem- cyanosis- none, clubbing, none, atrophy- none, strength- nl, + cane Neuro- grossly intact to observation Assessment & Plan:

## 2020-02-29 ENCOUNTER — Encounter: Payer: Self-pay | Admitting: Internal Medicine

## 2020-02-29 ENCOUNTER — Ambulatory Visit (INDEPENDENT_AMBULATORY_CARE_PROVIDER_SITE_OTHER): Payer: Medicare (Managed Care) | Admitting: Internal Medicine

## 2020-02-29 ENCOUNTER — Other Ambulatory Visit: Payer: Self-pay

## 2020-02-29 ENCOUNTER — Ambulatory Visit: Payer: Medicare (Managed Care) | Admitting: Internal Medicine

## 2020-02-29 VITALS — BP 154/80 | HR 81 | Temp 97.5°F | Ht 70.0 in | Wt 219.0 lb

## 2020-02-29 DIAGNOSIS — Z9989 Dependence on other enabling machines and devices: Secondary | ICD-10-CM

## 2020-02-29 DIAGNOSIS — G4733 Obstructive sleep apnea (adult) (pediatric): Secondary | ICD-10-CM | POA: Diagnosis not present

## 2020-02-29 DIAGNOSIS — J449 Chronic obstructive pulmonary disease, unspecified: Secondary | ICD-10-CM

## 2020-02-29 LAB — PULMONARY FUNCTION TEST
DL/VA % pred: 119 %
DL/VA: 4.61 ml/min/mmHg/L
DLCO cor % pred: 73 %
DLCO cor: 18.23 ml/min/mmHg
DLCO unc % pred: 73 %
DLCO unc: 18.23 ml/min/mmHg
FEF 25-75 Post: 1.51 L/sec
FEF 25-75 Pre: 1.59 L/sec
FEF2575-%Change-Post: -4 %
FEF2575-%Pred-Post: 73 %
FEF2575-%Pred-Pre: 76 %
FEV1-%Change-Post: -2 %
FEV1-%Pred-Post: 69 %
FEV1-%Pred-Pre: 71 %
FEV1-Post: 1.9 L
FEV1-Pre: 1.95 L
FEV1FVC-%Change-Post: -2 %
FEV1FVC-%Pred-Pre: 99 %
FEV6-%Change-Post: -1 %
FEV6-%Pred-Post: 72 %
FEV6-%Pred-Pre: 73 %
FEV6-Post: 2.59 L
FEV6-Pre: 2.63 L
FEV6FVC-%Pred-Post: 105 %
FEV6FVC-%Pred-Pre: 105 %
FVC-%Change-Post: 0 %
FVC-%Pred-Post: 69 %
FVC-%Pred-Pre: 69 %
FVC-Post: 2.62 L
FVC-Pre: 2.63 L
Post FEV1/FVC ratio: 73 %
Post FEV6/FVC ratio: 100 %
Pre FEV1/FVC ratio: 74 %
Pre FEV6/FVC Ratio: 100 %
RV % pred: 94 %
RV: 2.59 L
TLC % pred: 72 %
TLC: 5.23 L

## 2020-02-29 MED ORDER — TRELEGY ELLIPTA 100-62.5-25 MCG/INH IN AEPB: 1.0000 | INHALATION_SPRAY | Freq: Every day | RESPIRATORY_TRACT | 0 refills | Status: DC

## 2020-02-29 NOTE — Assessment & Plan Note (Signed)
Mild pulmonary deficits without wheeze, may not over much therapeutic opportunity. Plan- sample Trelegy 100 with discussion. Continue walking.

## 2020-02-29 NOTE — Progress Notes (Signed)
Full PFT performed today. °

## 2020-02-29 NOTE — Assessment & Plan Note (Signed)
Benefits from BIPAP but breakthrough AHI is higher than desired. Plan- increase VPAP range to 20/17

## 2020-02-29 NOTE — Patient Instructions (Signed)
Order- DME [employer]- please change BIPAP to 20/7  Order- sample x 2 Trelegy 100 inhaler    Inhale 1 puff then rinse mouth, once daily. See if this helps with shortness of breath.

## 2020-04-19 ENCOUNTER — Other Ambulatory Visit: Payer: Self-pay

## 2020-04-19 ENCOUNTER — Encounter: Payer: Self-pay | Admitting: Podiatry

## 2020-04-19 ENCOUNTER — Ambulatory Visit (INDEPENDENT_AMBULATORY_CARE_PROVIDER_SITE_OTHER): Payer: Medicare Other | Admitting: Podiatry

## 2020-04-19 DIAGNOSIS — B351 Tinea unguium: Secondary | ICD-10-CM

## 2020-04-19 DIAGNOSIS — M79675 Pain in left toe(s): Secondary | ICD-10-CM | POA: Diagnosis not present

## 2020-04-19 DIAGNOSIS — E1142 Type 2 diabetes mellitus with diabetic polyneuropathy: Secondary | ICD-10-CM | POA: Diagnosis not present

## 2020-04-19 DIAGNOSIS — M79674 Pain in right toe(s): Secondary | ICD-10-CM | POA: Diagnosis not present

## 2020-04-23 NOTE — Progress Notes (Signed)
Subjective: Roy [name] presents today for follow up of at risk foot care. Pt has h/o NIDDM with chronic kidney disease and painful mycotic nails b/l that are difficult to trim. Pain interferes with ambulation. Aggravating factors include wearing enclosed shoe gear. Pain is relieved with periodic professional debridement.   He states blood glucose was 87 mg/dl today.  Roy [name] voices no new pedal problems on today's visit.  No Known Allergies   Objective: There were no vitals filed for this visit.  Pt is a pleasant 85 y.o. year old AA male in NAD. AAO x 3.   Vascular Examination:  Capillary refill time to digits immediate b/l. Palpable DP pulses b/l. Nonpalpable PT pulses b/l. Pedal hair absent b/l Skin temperature gradient within normal limits b/l. +1 pitting edema b/l LE.  Dermatological Examination: Pedal skin with normal turgor, texture and tone bilaterally. No open wounds bilaterally. No interdigital macerations bilaterally. Toenails 1-5 b/l elongated, dystrophic, thickened, crumbly with subungual debris and tenderness to dorsal palpation.  Musculoskeletal: Normal muscle strength 5/5 to all lower extremity muscle groups bilaterally, no gross bony deformities bilaterally, no pain crepitus or joint limitation noted with ROM b/l and patient ambulates independent of any assistive aids  Neurological: Pt has subjective symptoms of neuropathy. Protective sensation intact 5/5 intact bilaterally with 10g monofilament b/l. Vibratory sensation intact b/l. Proprioception intact bilaterally. Babinski reflex negative b/l. Clonus negative b/l.  Assessment: 1. Pain due to onychomycosis of toenails of both feet   2. Diabetic peripheral neuropathy associated with type 2 diabetes mellitus ([place])    Plan: -Examined patient. -No new findings. No new orders. -Continue diabetic foot care principles. -Toenails 1-5 b/l were debrided in length and girth with sterile nail nippers and dremel  without iatrogenic bleeding.  -Patient to report any pedal injuries to medical professional immediately. -Patient to continue soft, supportive shoe gear daily. -Patient/POA to call should there be question/concern in the interim.  Return in about 3 months (around 07/18/2020).

## 2020-06-18 ENCOUNTER — Other Ambulatory Visit: Payer: Self-pay | Admitting: Cardiology

## 2020-06-18 DIAGNOSIS — I10 Essential (primary) hypertension: Secondary | ICD-10-CM

## 2020-06-21 ENCOUNTER — Ambulatory Visit: Payer: BC Managed Care – PPO | Admitting: Cardiology

## 2020-06-21 ENCOUNTER — Encounter: Payer: Self-pay | Admitting: Cardiology

## 2020-06-21 ENCOUNTER — Other Ambulatory Visit: Payer: Self-pay

## 2020-06-21 VITALS — BP 160/71 | HR 74 | Temp 97.7°F | Ht 70.0 in | Wt 217.0 lb

## 2020-06-21 DIAGNOSIS — G4733 Obstructive sleep apnea (adult) (pediatric): Secondary | ICD-10-CM

## 2020-06-21 DIAGNOSIS — I10 Essential (primary) hypertension: Secondary | ICD-10-CM

## 2020-06-21 DIAGNOSIS — I4819 Other persistent atrial fibrillation: Secondary | ICD-10-CM

## 2020-06-21 DIAGNOSIS — Z9989 Dependence on other enabling machines and devices: Secondary | ICD-10-CM

## 2020-06-21 MED ORDER — LABETALOL HCL 100 MG PO TABS: 100.0000 mg | ORAL_TABLET | Freq: Two times a day (BID) | ORAL | 5 refills | Status: DC

## 2020-06-21 NOTE — Progress Notes (Signed)
Patient referred by Rogers Blocker, MD for exertional dyspnea, leg swelling.  Subjective:   Roy [name], male    DOB: 1935-06-06, 85 y.o.   MRN: [id number]   Chief Complaint  Patient presents with  . Atrial Fibrillation  . Follow-up     HPI  85 y.o. African American male with hypertension, type 2 DM, persistent Afib, OSA, CKD IIIb  Home BP monitoring shows improved numbers, BP 151/82 mmHg, HR 60/min. BP trending up recently. Patient has unchanged mild exertional dyspnea.   Initial consultation HPI 10/2019: Patient is here with his son today.  Patient lives alone, performs all his ADLs without any difficulty.  He seems to be unaware of details of his medical history.  For example, he does not know that he has atrial fibrillation.  He believes he is on Eliquis to help his circulation.  He initially seemed to be unaware of his kidney disease, but then realizes that he is seeing a nephrologist.  He denies any chest pain or shortness of breath with his level of activity, which includes walking inside his one level house. He reports bilateral leg swelling, more towards the end of the day, improves with wearing compression stockings. Blood pressure elevated today.    Current Outpatient Medications on File Prior to Visit  Medication Sig Dispense Refill  . apixaban (ELIQUIS) 5 MG TABS tablet Take 1 tablet (5 mg total) by mouth 2 (two) times daily. 60 tablet 1  . cinacalcet (SENSIPAR) 30 MG tablet Take 30 mg by mouth daily.    . CONTOUR NEXT TEST test strip daily.    [name] diltiazem (CARDIZEM CD) 240 MG 24 hr capsule Take 240 mg by mouth daily.    [name] ELIQUIS 2.5 MG TABS tablet Take 2.5 mg by mouth 2 (two) times daily.    . finasteride (PROSCAR) 5 MG tablet Take 5 mg by mouth daily.    . Fluticasone-Umeclidin-Vilant (TRELEGY ELLIPTA) 100-62.5-25 MCG/INH AEPB Inhale 1 puff into the lungs daily. 14 each 0  . gabapentin (NEURONTIN) 100 MG capsule Take 100 mg by mouth daily.    . hydrALAZINE  (APRESOLINE) 50 MG tablet TAKE 1 TABLET BY MOUTH THREE TIMES DAILY 90 tablet 0  . isosorbide dinitrate (ISORDIL) 30 MG tablet TAKE 1 TABLET BY MOUTH THREE TIMES DAILY 90 tablet 0  . methenamine (HIPREX) 1 g tablet Take 500 mg by mouth 2 (two) times daily with a meal.     . Microlet Lancets MISC USE TO CHECK GLUCOSE ONE TIME A DAY AS DIRECTED    . pantoprazole (PROTONIX) 40 MG tablet Take 40 mg by mouth daily.     . potassium chloride SA (K-DUR,KLOR-CON) 20 MEQ tablet Take 20 mEq by mouth every Monday, Wednesday, and Friday.    [name] QUEtiapine (SEROQUEL) 50 MG tablet Take 50 mg by mouth at bedtime.     . tamsulosin (FLOMAX) 0.4 MG CAPS capsule Take 1 capsule (0.4 mg total) by mouth daily. 30 capsule 1  . torsemide (DEMADEX) 100 MG tablet Take 50 mg by mouth every Monday, Wednesday, and Friday.      No current facility-administered medications on file prior to visit.    Cardiovascular and other pertinent studies:  EKG 06/21/2020: Atrial fibrillation 82 bpm Left axis deviation Nonspecific T-abnormality  Echocardiogram 12/13/2019:  Left ventricle cavity is normal in size. Moderate concentric hypertrophy  of the left ventricle. Normal global wall motion. Normal LV systolic  function with visual EF 50-55%. Doppler evidence of grade  II  (pseudonormal) diastolic dysfunction, elevated LAP.  Left atrial cavity is severely dilated.  Right atrial cavity is mildly dilated.  Moderate calcification of the mitral valve annulus. Moderate (Grade III)  mitral regurgitation.  Moderate tricuspid regurgitation. Mild pulmonary hypertension. Estimated  pulmonary artery systolic pressure 50 mmHg.  Mild pulmonic regurgitation.  Trace pericardial effusion.  Recent labs: 11/18/2019: Glucose 98, BUN/Cr 17/1.86. EGFR 38. Na/K 147/5.1.   08/26/2019: Glucose 94, BUN/Cr 17/1.73. EGFR 42. Na/K 147/5.8. Protein, Total: 5.4, Albumin: 3 H/H 10.7/32.9. MCV 93.5. Platelets 171 HbA1C 5.5 % Chol 177, TG 89, HDL 54, LDL  105 BNP 132 NT pro BNP 1057   Review of Systems  Cardiovascular: Positive for leg swelling. Negative for chest pain, dyspnea on exertion, palpitations and syncope.         Vitals:   06/21/20 1308 06/21/20 1322  BP: (!) 162/82 (!) 160/71  Pulse: 80 74  Temp: 97.7 F (36.5 C)   SpO2: 98% 97%     Body mass index is 31.14 kg/m. Filed Weights   06/21/20 1308  Weight: 217 lb (98.4 kg)     Objective:   Physical Exam Vitals and nursing note reviewed.  Constitutional:      General: He is not in acute distress. HENT:     Head:     Comments: Dysarthria. No facial asymmetry. Has new denture.  Neck:     Vascular: No JVD.  Cardiovascular:     Rate and Rhythm: Normal rate. Rhythm irregularly irregular.     Heart sounds: Normal heart sounds. No murmur heard.   Pulmonary:     Effort: Pulmonary effort is normal.     Breath sounds: Normal breath sounds. No wheezing or rales.  Musculoskeletal:     Right lower leg: Edema (1+) present.     Left lower leg: Edema (1+) present.         Assessment & Recommendations:   85 y.o. African American male with hypertension, type 2 DM, persistent Afib, OSA, CKD IIIb  Hypertension: Uncontrolled. Added labetalol 100 mg bid.    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 Continue medical management of hypertension. Continue OSA for CPAP.   Afib: Likely persistent. No symptoms s/o ischemia. Most likely due to hypertension and OSA,  CHA2DS2VASc score 3, annual stroke risk 3.2%. Continue eliquis 2.5 mg bid  F/u in 3 months    [name], MD Pager: [phone] Office: [phone]

## 2020-07-05 ENCOUNTER — Other Ambulatory Visit: Payer: Self-pay

## 2020-07-05 DIAGNOSIS — I10 Essential (primary) hypertension: Secondary | ICD-10-CM

## 2020-07-06 ENCOUNTER — Other Ambulatory Visit: Payer: Medicare Other

## 2020-08-02 ENCOUNTER — Encounter: Payer: Self-pay | Admitting: Podiatry

## 2020-08-02 ENCOUNTER — Other Ambulatory Visit: Payer: Self-pay | Admitting: Cardiology

## 2020-08-02 ENCOUNTER — Other Ambulatory Visit: Payer: Self-pay

## 2020-08-02 ENCOUNTER — Ambulatory Visit: Payer: Medicare Other | Admitting: Podiatry

## 2020-08-02 DIAGNOSIS — L853 Xerosis cutis: Secondary | ICD-10-CM | POA: Diagnosis not present

## 2020-08-02 DIAGNOSIS — M79675 Pain in left toe(s): Secondary | ICD-10-CM | POA: Diagnosis not present

## 2020-08-02 DIAGNOSIS — I10 Essential (primary) hypertension: Secondary | ICD-10-CM

## 2020-08-02 DIAGNOSIS — B351 Tinea unguium: Secondary | ICD-10-CM

## 2020-08-02 DIAGNOSIS — M79674 Pain in right toe(s): Secondary | ICD-10-CM

## 2020-08-02 NOTE — Patient Instructions (Signed)
For extremely dry, cracked feet: moisturize feet once daily; do not apply between toes A. CeraVe Healing Ointment B. Aquaphor Healing Ointment C. Vaseline Petroleum Healing Jelly   If you have problems reaching your feet: apply to feet once daily; do not apply between toes A.  Aquaphor Advanced Therapy Ointment Body Spray B.  Vaseline Intensive Care Spray Lotion Advanced Repair   

## 2020-08-07 NOTE — Progress Notes (Signed)
Subjective: Roy [name] is a pleasant 85 y.o. male for at risk foot care with h/o NIDDM with CKD. He is seen today painful thick toenails that are difficult to trim. Pain interferes with ambulation. Aggravating factors include wearing enclosed shoe gear. Pain is relieved with periodic professional debridement.  He voices no new pedal concerns on today's visit.  His PCP is Dr. Kevan Ny and last visit was   No Known Allergies  Objective: Physical Exam  General: Roy [name] is a pleasant 85 y.o. African American male, morbidly obese in NAD. AAO x 3.   Vascular:  Capillary refill time to digits immediate b/l. Palpable DP pulses b/l. Nonpalpable PT pulses b/l. Pedal hair absent b/l.   Lower extremity skin temperature gradient within normal limits. No pain with calf compression b/l. +1 edema BLE. Patient does wear compression knee highs b/l LE.  Dermatological:  Pedal skin with normal turgor, texture and tone bilaterally. No open wounds bilaterally. No interdigital macerations bilaterally. Toenails 1-5 b/l elongated, discolored, dystrophic, thickened, crumbly with subungual debris and tenderness to dorsal palpation. No hyperkeratotic nor porokeratotic lesions present on today's visit.  Dry, scaly skin noted b/l feet. No breaks in skin. No erythema, no edema, no drainage, no fluctuance.  Musculoskeletal:  Normal muscle strength 5/5 to all lower extremity muscle groups bilaterally. No pain crepitus or joint limitation noted with ROM b/l. No gross bony deformities bilaterally. Patient ambulates independent of any assistive aids.  Neurological:  Patient has subjective symptoms of neuropathy. Protective sensation intact 5/5 intact bilaterally with 10g monofilament b/l. Vibratory sensation intact b/l. Clonus negative b/l.  Assessment and Plan:  1. Pain due to onychomycosis of toenails of both feet   2. Xerosis cutis     -Examined patient. -Patient to continue soft, supportive shoe  gear daily. -Toenails 1-5 b/l were debrided in length and girth with sterile nail nippers and dremel without iatrogenic bleeding.  -Patient to report any pedal injuries to medical professional immediately. -For dry skin, patient was given written list of OTC moisturizers. Patient/POA instructed to apply to foot/feet once daily avoiding application between toes.  -Patient/POA to call should there be question/concern in the interim.  Return in about 3 months (around 11/02/2020).  Marzetta Board, DPM

## 2020-08-08 ENCOUNTER — Ambulatory Visit: Payer: BC Managed Care – PPO

## 2020-08-08 ENCOUNTER — Other Ambulatory Visit: Payer: Self-pay

## 2020-08-08 DIAGNOSIS — I10 Essential (primary) hypertension: Secondary | ICD-10-CM

## 2020-08-28 ENCOUNTER — Encounter: Payer: Self-pay | Admitting: Internal Medicine

## 2020-08-28 NOTE — Progress Notes (Signed)
Subjective:    Patient ID: Roy [name], male    DOB: 08-Jul-1935, 85 y.o.   MRN: [id number]  HPI  male former smoker followed for OSA, asthma/COPD NPSG 04/15/01 AHI 49/hour, desaturation to 72%, body weight 260 pounds PFT 02/29/20-  Mild restriction, mild obstruction w/o resp to BD, DLCO mildly reduced. ECHO 12/13/19- EF 50-55%, LAE, mitral regurg,  Mild PAH 50 mmHg, Gr2DD ---------------------------------------------------------------------------------   02/29/20-  85 year old male former smoker followed for OSA, asthma/COPD, complicated by Hx DVT, AFib, CKD3, DM2, HBP, Obesity, GERD,  VPAP 18/5 [employer] Download- compliance 100%, AHI 12.7/ hr Body weight today- Covid vax- 3 Moderna Flu vax-had -----PFT, COPD,OSA,denies bipap problems, short of breath with exertion Doing ok with his BIPAP. Discussed higher than desired AHI. We will raise pressure.  No acute issues with breathing. Little cough or wheeze. Stable DOE. Agrees to try Trelegy.  PFT 02/29/20-  Mild restriction, mild obstruction w/o resp to BD, DLCO mildly reduced. ECHO 12/13/19- EF 50-55%, LAE, mitral regurg,  Mild PAH 50 mmHg, Gr2DD  08/29/20- 85 year old male former smoker(12.5 pkyrs) followed for OSA, asthma/COPD, complicated by Hx DVT, AFib, CKD3, DM2, HBP, Obesity, GERD,  Trelegy 100 VPAP 20/7 [employer] Download- compliance 93%, AHI 21.3/ hr mosty central apneas     AirCurve 10 VAuto Body weight today-218 lbs Covid vax-3 Moderna ------Patient states that he does not sleep good at night, feels like breathing is the same since last visit.  He has trouble initiating sleep, but once asleep he is comfortable, without significant daytime sleepiness. Breathing stable with no acute issues. Continues cardiac meds and denies recent cardiac concerns.  Download reviewed with one of my associates. Peak mask leak significant, but 95% of time leak is modest. No obvious setting changes likely to help any better.   ROS-see HPI   + =  positive Constitutional:    weight loss, night sweats, fevers, chills, fatigue, lassitude. HEENT:   No-  headaches, difficulty swallowing, tooth/dental problems, sore throat,       No-  sneezing, itching, ear ache, nasal congestion, post nasal drip,  CV:  No-   chest pain, orthopnea, PND, + swelling in lower extremities, anasarca,  dizziness, palpitations Resp: + shortness of breath with exertion or at rest.              No-   productive cough,  No non-productive cough,  No- coughing up of blood.              No-   change in color of mucus.  No- wheezing.   Skin: No-   rash or lesions. GI:  No-   heartburn, indigestion, abdominal pain, nausea, vomiting,  GU:  MS:  No-   joint pain or swelling.   Neuro-     nothing unusual Psych:  No- change in mood or affect. No depression or anxiety.  No memory loss.  Objective:   Physical Exam General- Alert, Oriented, Affect-appropriate, Distress- none acute,  + overweight Skin- rash-none, lesions- none, excoriation- none Lymphadenopathy- none Head- atraumatic            Eyes- Gross vision intact, PERRLA, conjunctivae clear secretions            Ears- Hearing, canals            Nose- Clear, No- Septal dev, mucus, polyps, erosion, perforation             Throat- Mallampati III , mucosa clear , drainage- none, tonsils- present.   + Dental  repair Neck- flexible , trachea midline, no stridor , thyroid nl, carotid no bruit Chest - symmetrical excursion , unlabored           Heart/CV- RRR , no murmur , no gallop  , no rub, nl s1 s2                           - JVD- none , edema- nonel, stasis changes- none, varices- none           Lung- clear to P&A, wheeze- none, cough- none , dullness-none, rub- none           Chest wall-  Abd-  Br/ Gen/ Rectal- Not done, not indicated Extrem- cyanosis- none, clubbing, none, atrophy- none, strength- nl, + cane Neuro- grossly intact to observation Assessment & Plan:

## 2020-08-29 ENCOUNTER — Other Ambulatory Visit: Payer: Self-pay

## 2020-08-29 ENCOUNTER — Ambulatory Visit: Payer: Medicare (Managed Care) | Admitting: Internal Medicine

## 2020-08-29 ENCOUNTER — Encounter: Payer: Self-pay | Admitting: Internal Medicine

## 2020-08-29 DIAGNOSIS — G4733 Obstructive sleep apnea (adult) (pediatric): Secondary | ICD-10-CM | POA: Diagnosis not present

## 2020-08-29 DIAGNOSIS — J449 Chronic obstructive pulmonary disease, unspecified: Secondary | ICD-10-CM

## 2020-08-29 DIAGNOSIS — I4819 Other persistent atrial fibrillation: Secondary | ICD-10-CM | POA: Diagnosis not present

## 2020-08-29 MED ORDER — ZALEPLON 5 MG PO CAPS: 5.0000 mg | ORAL_CAPSULE | Freq: Every evening | ORAL | 5 refills | Status: DC | PRN

## 2020-08-29 NOTE — Assessment & Plan Note (Signed)
He is not wheezing and denies any recentexacerbation Plan- continue present meds

## 2020-08-29 NOTE — Assessment & Plan Note (Addendum)
Significant excess residual apneas are mostly central and likely reflect CNS perfusion. Unclear if he has enough mask leak to affect the results. He is comfortable and agrees he sleeps better with his PAP machine, so we will continue present settings.  Ok to try short-acting zaleplon to help sleep onset. Discussion done.

## 2020-08-29 NOTE — Patient Instructions (Signed)
We are going to leave your BIPAP settings where they are.  Script sent for zaleplon [facility]) for sleep    Try 1 just before bedtime as needed  Please cal if we can help

## 2020-08-29 NOTE — Assessment & Plan Note (Signed)
If not in NSR now, his rate control is very good.

## 2020-09-18 ENCOUNTER — Other Ambulatory Visit: Payer: Self-pay

## 2020-09-18 ENCOUNTER — Ambulatory Visit (HOSPITAL_COMMUNITY)
Admission: RE | Admit: 2020-09-18 | Discharge: 2020-09-18 | Disposition: A | Discharge status: Home / Self Care | Payer: Medicare Other | Source: Ambulatory Visit | Attending: Nephrology | Admitting: Nephrology

## 2020-09-18 ENCOUNTER — Other Ambulatory Visit: Payer: Self-pay | Admitting: Cardiology

## 2020-09-18 VITALS — BP 141/94 | HR 70 | Temp 97.9°F | Resp 20

## 2020-09-18 DIAGNOSIS — N183 Chronic kidney disease, stage 3 unspecified: Secondary | ICD-10-CM | POA: Insufficient documentation

## 2020-09-18 DIAGNOSIS — I10 Essential (primary) hypertension: Secondary | ICD-10-CM

## 2020-09-18 LAB — POCT HEMOGLOBIN-HEMACUE: Hemoglobin: 10 g/dL — ABNORMAL LOW (ref 13.0–17.0)

## 2020-09-18 MED ORDER — EPOETIN ALFA-EPBX 10000 UNIT/ML IJ SOLN
10000.0000 [IU] | INTRAMUSCULAR | Status: DC
  Administered 2020-09-18: 10:00:00 10000 [IU] via SUBCUTANEOUS

## 2020-09-18 MED ORDER — EPOETIN ALFA-EPBX 10000 UNIT/ML IJ SOLN
INTRAMUSCULAR | Status: AC
  Filled 2020-09-18: qty 1

## 2020-09-20 ENCOUNTER — Encounter (HOSPITAL_COMMUNITY): Payer: Medicare Other

## 2020-09-21 ENCOUNTER — Other Ambulatory Visit: Payer: Self-pay

## 2020-09-21 ENCOUNTER — Encounter: Payer: Self-pay | Admitting: Cardiology

## 2020-09-21 ENCOUNTER — Ambulatory Visit: Payer: Medicare Other | Admitting: Cardiology

## 2020-09-21 VITALS — BP 134/56 | HR 73 | Temp 98.6°F | Resp 16 | Ht 70.0 in | Wt 207.0 lb

## 2020-09-21 DIAGNOSIS — I10 Essential (primary) hypertension: Secondary | ICD-10-CM

## 2020-09-21 DIAGNOSIS — I4819 Other persistent atrial fibrillation: Secondary | ICD-10-CM

## 2020-09-21 DIAGNOSIS — G4733 Obstructive sleep apnea (adult) (pediatric): Secondary | ICD-10-CM

## 2020-09-21 NOTE — Progress Notes (Signed)
Patient referred by Rogers Blocker, MD for exertional dyspnea, leg swelling.  Subjective:   Roy [name], male    DOB: [birth year], 85 y.o.   MRN: [id number]   Chief Complaint  Patient presents with   Hypertension   Mitral Regurgitation   Follow-up    3 month     HPI  85 y.o. African American male with hypertension, type 2 DM, persistent Afib, OSA, CKD IIIb  Home BP monitoring shows numbers 150s/80s. He denies chest pain, shortness of breath, palpitations, leg edema, orthopnea, PND, TIA/syncope.  Initial consultation HPI 10/2019: Patient is here with his son today.  Patient lives alone, performs all his ADLs without any difficulty.  He seems to be unaware of details of his medical history.  For example, he does not know that he has atrial fibrillation.  He believes he is on Eliquis to help his circulation.  He initially seemed to be unaware of his kidney disease, but then realizes that he is seeing a nephrologist.  He denies any chest pain or shortness of breath with his level of activity, which includes walking inside his one level house. He reports bilateral leg swelling, more towards the end of the day, improves with wearing compression stockings. Blood pressure elevated today.    Current Outpatient Medications on File Prior to Visit  Medication Sig Dispense Refill   cinacalcet (SENSIPAR) 30 MG tablet Take 30 mg by mouth daily.     CONTOUR NEXT TEST test strip daily.     diltiazem (CARDIZEM CD) 240 MG 24 hr capsule Take 240 mg by mouth daily.     ELIQUIS 2.5 MG TABS tablet Take 2.5 mg by mouth 2 (two) times daily.     finasteride (PROSCAR) 5 MG tablet Take 5 mg by mouth daily.     Fluticasone-Umeclidin-Vilant (TRELEGY ELLIPTA) 100-62.5-25 MCG/INH AEPB Inhale 1 puff into the lungs daily. 14 each 0   gabapentin (NEURONTIN) 100 MG capsule Take 100 mg by mouth daily.     hydrALAZINE (APRESOLINE) 50 MG tablet TAKE 1 TABLET BY MOUTH THREE TIMES DAILY 90 tablet 0   isosorbide  dinitrate (ISORDIL) 30 MG tablet TAKE 1 TABLET BY MOUTH THREE TIMES DAILY 90 tablet 0   labetalol (NORMODYNE) 100 MG tablet Take 1 tablet (100 mg total) by mouth 2 (two) times daily. 60 tablet 5   methenamine (HIPREX) 1 g tablet Take 500 mg by mouth 2 (two) times daily with a meal.      Microlet Lancets MISC USE TO CHECK GLUCOSE ONE TIME A DAY AS DIRECTED     pantoprazole (PROTONIX) 40 MG tablet Take 40 mg by mouth daily.     potassium chloride SA (K-DUR,KLOR-CON) 20 MEQ tablet Take 20 mEq by mouth every Monday, Wednesday, and Friday.     QUEtiapine (SEROQUEL) 50 MG tablet Take 50 mg by mouth at bedtime.      tamsulosin (FLOMAX) 0.4 MG CAPS capsule Take 1 capsule (0.4 mg total) by mouth daily. 30 capsule 1   torsemide (DEMADEX) 100 MG tablet Take 50 mg by mouth every Monday, Wednesday, and Friday.      zaleplon (SONATA) 5 MG capsule Take 1 capsule (5 mg total) by mouth at bedtime as needed for sleep. 30 capsule 5   No current facility-administered medications on file prior to visit.    Cardiovascular and other pertinent studies:  EKG 06/21/2020: Atrial fibrillation 82 bpm Left axis deviation Nonspecific T-abnormality  Echocardiogram 12/13/2019:  Left ventricle cavity is normal  in size. Moderate concentric hypertrophy  of the left ventricle. Normal global wall motion. Normal LV systolic  function with visual EF 50-55%. Doppler evidence of grade II  (pseudonormal) diastolic dysfunction, elevated LAP.  Left atrial cavity is severely dilated.  Right atrial cavity is mildly dilated.  Moderate calcification of the mitral valve annulus. Moderate (Grade III)  mitral regurgitation.  Moderate tricuspid regurgitation. Mild pulmonary hypertension. Estimated  pulmonary artery systolic pressure 50 mmHg.  Mild pulmonic regurgitation.  Trace pericardial effusion.  Recent labs: 11/18/2019: Glucose 98, BUN/Cr 17/1.86. EGFR 38. Na/K 147/5.1.   08/26/2019: Glucose 94, BUN/Cr 17/1.73. EGFR 42. Na/K  147/5.8. Protein, Total: 5.4, Albumin: 3 H/H 10.7/32.9. MCV 93.5. Platelets 171 HbA1C 5.5 % Chol 177, TG 89, HDL 54, LDL 105 BNP 132 NT pro BNP 1057   ROS       Vitals:   09/21/20 1405  BP: (!) 134/56  Pulse: 73  Resp: 16  Temp: 98.6 F (37 C)  SpO2: 97%     Body mass index is 29.7 kg/m. Filed Weights   09/21/20 1405  Weight: 207 lb (93.9 kg)     Objective:   Physical Exam Vitals and nursing note reviewed.  Constitutional:      General: He is not in acute distress. Neck:     Vascular: No JVD.  Cardiovascular:     Rate and Rhythm: Normal rate. Rhythm irregular.     Heart sounds: Normal heart sounds. No murmur heard. Pulmonary:     Effort: Pulmonary effort is normal.     Breath sounds: Normal breath sounds. No wheezing or rales.  Musculoskeletal:     Right lower leg: No edema.     Left lower leg: No edema.        Assessment & Recommendations:   85 y.o. African American male with hypertension, type 2 DM, persistent Afib, OSA, CKD IIIb  Hypertension: Uncontrolled. 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 Continue medical management of hypertension. Continue OSA for CPAP.   Afib: Likely persistent. No symptoms s/o ischemia. Most likely due to hypertension and OSA,  CHA2DS2VASc score 3, annual stroke risk 3.2%. Continue eliquis 2.5 mg bid  F/u in 6 months    Roy Mormon, MD Pager: [phone] Office: [phone]

## 2020-09-26 ENCOUNTER — Encounter (HOSPITAL_COMMUNITY)
Admission: RE | Admit: 2020-09-26 | Discharge: 2020-09-26 | Disposition: A | Discharge status: Home / Self Care | Payer: Medicare Other | Source: Ambulatory Visit | Attending: Nephrology | Admitting: Nephrology

## 2020-09-26 ENCOUNTER — Other Ambulatory Visit: Payer: Self-pay

## 2020-09-26 VITALS — BP 126/77 | HR 65 | Temp 98.4°F | Resp 18

## 2020-09-26 DIAGNOSIS — N183 Chronic kidney disease, stage 3 unspecified: Secondary | ICD-10-CM | POA: Diagnosis present

## 2020-09-26 DIAGNOSIS — D631 Anemia in chronic kidney disease: Secondary | ICD-10-CM | POA: Insufficient documentation

## 2020-09-26 LAB — POCT HEMOGLOBIN-HEMACUE: Hemoglobin: 8.7 g/dL — ABNORMAL LOW (ref 13.0–17.0)

## 2020-09-26 MED ORDER — EPOETIN ALFA-EPBX 10000 UNIT/ML IJ SOLN: 10000.0000 [IU] | INTRAMUSCULAR | Status: DC

## 2020-09-26 MED ORDER — EPOETIN ALFA-EPBX 10000 UNIT/ML IJ SOLN
INTRAMUSCULAR | Status: AC
  Administered 2020-09-26: 10000 [IU] via SUBCUTANEOUS
  Filled 2020-09-26: qty 1

## 2020-10-03 ENCOUNTER — Other Ambulatory Visit: Payer: Self-pay

## 2020-10-03 ENCOUNTER — Encounter (HOSPITAL_COMMUNITY)
Admission: RE | Admit: 2020-10-03 | Discharge: 2020-10-03 | Disposition: A | Discharge status: Home / Self Care | Payer: Medicare Other | Source: Ambulatory Visit | Attending: Nephrology | Admitting: Nephrology

## 2020-10-03 VITALS — BP 131/60 | HR 65 | Temp 97.6°F | Resp 20

## 2020-10-03 DIAGNOSIS — N183 Chronic kidney disease, stage 3 unspecified: Secondary | ICD-10-CM | POA: Diagnosis not present

## 2020-10-03 LAB — POCT HEMOGLOBIN-HEMACUE: Hemoglobin: 9.1 g/dL — ABNORMAL LOW (ref 13.0–17.0)

## 2020-10-03 MED ORDER — EPOETIN ALFA-EPBX 10000 UNIT/ML IJ SOLN
10000.0000 [IU] | INTRAMUSCULAR | Status: DC
  Administered 2020-10-03: 10000 [IU] via SUBCUTANEOUS

## 2020-10-03 MED ORDER — EPOETIN ALFA-EPBX 10000 UNIT/ML IJ SOLN
INTRAMUSCULAR | Status: AC
  Filled 2020-10-03: qty 1

## 2020-10-09 ENCOUNTER — Encounter (HOSPITAL_COMMUNITY): Payer: Self-pay

## 2020-10-10 ENCOUNTER — Encounter (HOSPITAL_COMMUNITY)
Admission: RE | Admit: 2020-10-10 | Discharge: 2020-10-10 | Disposition: A | Discharge status: Home / Self Care | Payer: Medicare Other | Source: Ambulatory Visit | Attending: Nephrology | Admitting: Nephrology

## 2020-10-10 ENCOUNTER — Encounter (HOSPITAL_COMMUNITY): Payer: Self-pay

## 2020-10-10 VITALS — BP 121/56 | HR 56 | Temp 97.9°F | Resp 20

## 2020-10-10 DIAGNOSIS — N183 Chronic kidney disease, stage 3 unspecified: Secondary | ICD-10-CM | POA: Diagnosis not present

## 2020-10-10 LAB — POCT HEMOGLOBIN-HEMACUE: Hemoglobin: 9.7 g/dL — ABNORMAL LOW (ref 13.0–17.0)

## 2020-10-10 MED ORDER — EPOETIN ALFA-EPBX 10000 UNIT/ML IJ SOLN
INTRAMUSCULAR | Status: AC
  Administered 2020-10-10: 10000 [IU] via SUBCUTANEOUS
  Filled 2020-10-10: qty 1

## 2020-10-10 MED ORDER — EPOETIN ALFA-EPBX 10000 UNIT/ML IJ SOLN: 10000.0000 [IU] | INTRAMUSCULAR | Status: DC

## 2020-10-17 ENCOUNTER — Other Ambulatory Visit: Payer: Self-pay

## 2020-10-17 ENCOUNTER — Encounter (HOSPITAL_COMMUNITY)
Admission: RE | Admit: 2020-10-17 | Discharge: 2020-10-17 | Disposition: A | Discharge status: Home / Self Care | Payer: Medicare Other | Source: Ambulatory Visit | Attending: Nephrology | Admitting: Nephrology

## 2020-10-17 VITALS — BP 138/95 | HR 88 | Resp 19

## 2020-10-17 DIAGNOSIS — N183 Chronic kidney disease, stage 3 unspecified: Secondary | ICD-10-CM | POA: Diagnosis not present

## 2020-10-17 LAB — IRON AND TIBC
Iron: 46 ug/dL (ref 45–182)
Saturation Ratios: 16 % — ABNORMAL LOW (ref 17.9–39.5)
TIBC: 286 ug/dL (ref 250–450)
UIBC: 240 ug/dL

## 2020-10-17 LAB — FERRITIN: Ferritin: 48 ng/mL (ref 24–336)

## 2020-10-17 LAB — POCT HEMOGLOBIN-HEMACUE: Hemoglobin: 10.7 g/dL — ABNORMAL LOW (ref 13.0–17.0)

## 2020-10-17 MED ORDER — EPOETIN ALFA-EPBX 10000 UNIT/ML IJ SOLN
INTRAMUSCULAR | Status: AC
  Filled 2020-10-17: qty 1

## 2020-10-17 MED ORDER — EPOETIN ALFA-EPBX 10000 UNIT/ML IJ SOLN
10000.0000 [IU] | INTRAMUSCULAR | Status: DC
  Administered 2020-10-17: 10000 [IU] via SUBCUTANEOUS

## 2020-10-24 ENCOUNTER — Other Ambulatory Visit: Payer: Self-pay

## 2020-10-24 ENCOUNTER — Encounter (HOSPITAL_COMMUNITY)
Admission: RE | Admit: 2020-10-24 | Discharge: 2020-10-24 | Disposition: A | Discharge status: Home / Self Care | Payer: Medicare Other | Source: Ambulatory Visit | Attending: Nephrology | Admitting: Nephrology

## 2020-10-24 VITALS — BP 115/48 | HR 57 | Temp 98.3°F | Resp 20

## 2020-10-24 DIAGNOSIS — D631 Anemia in chronic kidney disease: Secondary | ICD-10-CM | POA: Diagnosis not present

## 2020-10-24 DIAGNOSIS — N183 Chronic kidney disease, stage 3 unspecified: Secondary | ICD-10-CM

## 2020-10-24 DIAGNOSIS — N184 Chronic kidney disease, stage 4 (severe): Secondary | ICD-10-CM | POA: Diagnosis present

## 2020-10-24 MED ORDER — EPOETIN ALFA-EPBX 10000 UNIT/ML IJ SOLN
10000.0000 [IU] | INTRAMUSCULAR | Status: DC
  Administered 2020-10-24: 10000 [IU] via SUBCUTANEOUS

## 2020-10-24 MED ORDER — EPOETIN ALFA-EPBX 10000 UNIT/ML IJ SOLN
INTRAMUSCULAR | Status: AC
  Filled 2020-10-24: qty 1

## 2020-10-25 LAB — POCT HEMOGLOBIN-HEMACUE: Hemoglobin: 10.3 g/dL — ABNORMAL LOW (ref 13.0–17.0)

## 2020-10-31 ENCOUNTER — Encounter (HOSPITAL_COMMUNITY)
Admission: RE | Admit: 2020-10-31 | Discharge: 2020-10-31 | Disposition: A | Discharge status: Home / Self Care | Payer: Medicare Other | Source: Ambulatory Visit | Attending: Nephrology | Admitting: Nephrology

## 2020-10-31 VITALS — BP 124/50 | HR 81 | Temp 97.9°F | Resp 20

## 2020-10-31 DIAGNOSIS — N183 Chronic kidney disease, stage 3 unspecified: Secondary | ICD-10-CM

## 2020-10-31 DIAGNOSIS — N184 Chronic kidney disease, stage 4 (severe): Secondary | ICD-10-CM | POA: Diagnosis not present

## 2020-10-31 LAB — POCT HEMOGLOBIN-HEMACUE: Hemoglobin: 10 g/dL — ABNORMAL LOW (ref 13.0–17.0)

## 2020-10-31 MED ORDER — EPOETIN ALFA-EPBX 10000 UNIT/ML IJ SOLN
INTRAMUSCULAR | Status: AC
  Filled 2020-10-31: qty 1

## 2020-10-31 MED ORDER — EPOETIN ALFA-EPBX 10000 UNIT/ML IJ SOLN
10000.0000 [IU] | INTRAMUSCULAR | Status: DC
  Administered 2020-10-31: 10000 [IU] via SUBCUTANEOUS

## 2020-11-06 ENCOUNTER — Encounter: Payer: Self-pay | Admitting: Podiatry

## 2020-11-06 ENCOUNTER — Other Ambulatory Visit: Payer: Self-pay

## 2020-11-06 ENCOUNTER — Ambulatory Visit (INDEPENDENT_AMBULATORY_CARE_PROVIDER_SITE_OTHER): Payer: Medicare Other | Admitting: Podiatry

## 2020-11-06 DIAGNOSIS — B351 Tinea unguium: Secondary | ICD-10-CM | POA: Diagnosis not present

## 2020-11-06 DIAGNOSIS — M79675 Pain in left toe(s): Secondary | ICD-10-CM | POA: Diagnosis not present

## 2020-11-06 DIAGNOSIS — M79674 Pain in right toe(s): Secondary | ICD-10-CM

## 2020-11-07 ENCOUNTER — Encounter (HOSPITAL_COMMUNITY)
Admission: RE | Admit: 2020-11-07 | Discharge: 2020-11-07 | Disposition: A | Discharge status: Home / Self Care | Payer: Medicare Other | Source: Ambulatory Visit | Attending: Nephrology | Admitting: Nephrology

## 2020-11-07 VITALS — BP 114/68 | HR 55 | Temp 97.9°F | Resp 20

## 2020-11-07 DIAGNOSIS — N183 Chronic kidney disease, stage 3 unspecified: Secondary | ICD-10-CM

## 2020-11-07 DIAGNOSIS — N184 Chronic kidney disease, stage 4 (severe): Secondary | ICD-10-CM | POA: Diagnosis not present

## 2020-11-07 LAB — POCT HEMOGLOBIN-HEMACUE: Hemoglobin: 10.8 g/dL — ABNORMAL LOW (ref 13.0–17.0)

## 2020-11-07 MED ORDER — EPOETIN ALFA-EPBX 10000 UNIT/ML IJ SOLN: 10000.0000 [IU] | INTRAMUSCULAR | Status: DC

## 2020-11-07 MED ORDER — EPOETIN ALFA-EPBX 10000 UNIT/ML IJ SOLN
INTRAMUSCULAR | Status: AC
  Administered 2020-11-07: 10000 [IU] via SUBCUTANEOUS
  Filled 2020-11-07: qty 1

## 2020-11-11 NOTE — Progress Notes (Signed)
  Subjective:  Patient ID: Roy [name], male    DOB: [birth year],  MRN: [id number]  85 y.o. male presents with painful thick toenails that are difficult to trim. Pain interferes with ambulation. Aggravating factors include wearing enclosed shoe gear. Pain is relieved with periodic professional debridement.  PCP: Rogers Blocker, MD and last visit was: July, 2022.  Review of Systems: Negative except as noted in the HPI.   No Known Allergies  Objective:  There were no vitals filed for this visit. Constitutional Patient is a pleasant 85 y.o. African American male morbidly obese in NAD. AAO x 3.  Vascular Capillary refill time to digits immediate b/l. Palpable DP pulse(s) b/l lower extremities Nonpalpable PT pulse(s) b/l lower extremities. Pedal hair absent. Lower extremity skin temperature gradient within normal limits. No pain with calf compression b/l. Patient wearing compression hose on today's visit. +1 pitting edema b/l lower extremities. No cyanosis or clubbing noted.  Neurologic Normal speech. Pt has subjective symptoms of neuropathy. Protective sensation intact 5/5 intact bilaterally with 10g monofilament b/l. Vibratory sensation intact b/l.  Dermatologic Skin warm and supple b/l lower extremities. No open wounds b/l lower extremities. No interdigital macerations b/l lower extremities. Toenails 1-5 b/l elongated, discolored, dystrophic, thickened, crumbly with subungual debris and tenderness to dorsal palpation.  Orthopedic: Normal muscle strength 5/5 to all lower extremity muscle groups bilaterally. No pain crepitus or joint limitation noted with ROM b/l lower extremities. No gross bony deformities b/l lower extremities. Patient ambulates independent of any assistive aids.    Assessment:   1. Pain due to onychomycosis of toenails of both feet    Plan:  Patient was evaluated and treated and all questions answered.  Onychomycosis with pain -Nails palliatively debridement as  below. -Educated on self-care  Procedure: Nail Debridement Rationale: Pain Type of Debridement: manual, sharp debridement. Instrumentation: Nail nipper, rotary burr. Number of Nails: 10  -Examined patient. -Patient to continue soft, supportive shoe gear daily. -Toenails 1-5 b/l were debrided in length and girth with sterile nail nippers and dremel without iatrogenic bleeding.  -Patient to report any pedal injuries to medical professional immediately. -Patient/POA to call should there be question/concern in the interim.  Return in about 3 months (around 02/06/2021).  Marzetta Board, DPM

## 2020-11-14 ENCOUNTER — Other Ambulatory Visit: Payer: Self-pay

## 2020-11-14 ENCOUNTER — Encounter (HOSPITAL_COMMUNITY)
Admission: RE | Admit: 2020-11-14 | Discharge: 2020-11-14 | Disposition: A | Discharge status: Home / Self Care | Payer: Medicare Other | Source: Ambulatory Visit | Attending: Nephrology | Admitting: Nephrology

## 2020-11-14 VITALS — BP 124/57 | HR 60 | Temp 98.2°F | Resp 20

## 2020-11-14 DIAGNOSIS — N184 Chronic kidney disease, stage 4 (severe): Secondary | ICD-10-CM | POA: Diagnosis not present

## 2020-11-14 DIAGNOSIS — N183 Chronic kidney disease, stage 3 unspecified: Secondary | ICD-10-CM

## 2020-11-14 LAB — IRON AND TIBC
Iron: 43 ug/dL — ABNORMAL LOW (ref 45–182)
Saturation Ratios: 16 % — ABNORMAL LOW (ref 17.9–39.5)
TIBC: 267 ug/dL (ref 250–450)
UIBC: 224 ug/dL

## 2020-11-14 LAB — FERRITIN: Ferritin: 29 ng/mL (ref 24–336)

## 2020-11-14 LAB — POCT HEMOGLOBIN-HEMACUE: Hemoglobin: 10.2 g/dL — ABNORMAL LOW (ref 13.0–17.0)

## 2020-11-14 MED ORDER — EPOETIN ALFA-EPBX 10000 UNIT/ML IJ SOLN
10000.0000 [IU] | INTRAMUSCULAR | Status: DC
  Administered 2020-11-14: 10000 [IU] via SUBCUTANEOUS

## 2020-11-14 MED ORDER — EPOETIN ALFA-EPBX 10000 UNIT/ML IJ SOLN
INTRAMUSCULAR | Status: AC
  Filled 2020-11-14: qty 1

## 2020-11-21 ENCOUNTER — Other Ambulatory Visit: Payer: Self-pay

## 2020-11-21 ENCOUNTER — Encounter (HOSPITAL_COMMUNITY)
Admission: RE | Admit: 2020-11-21 | Discharge: 2020-11-21 | Disposition: A | Discharge status: Home / Self Care | Payer: Medicare Other | Source: Ambulatory Visit | Attending: Nephrology | Admitting: Nephrology

## 2020-11-21 VITALS — BP 126/50 | HR 50 | Temp 97.9°F | Resp 20

## 2020-11-21 DIAGNOSIS — N184 Chronic kidney disease, stage 4 (severe): Secondary | ICD-10-CM | POA: Diagnosis not present

## 2020-11-21 DIAGNOSIS — N183 Chronic kidney disease, stage 3 unspecified: Secondary | ICD-10-CM

## 2020-11-21 MED ORDER — EPOETIN ALFA-EPBX 10000 UNIT/ML IJ SOLN
INTRAMUSCULAR | Status: AC
  Filled 2020-11-21: qty 1

## 2020-11-21 MED ORDER — EPOETIN ALFA-EPBX 10000 UNIT/ML IJ SOLN
10000.0000 [IU] | INTRAMUSCULAR | Status: DC
  Administered 2020-11-21: 10000 [IU] via SUBCUTANEOUS

## 2020-11-22 LAB — POCT HEMOGLOBIN-HEMACUE: Hemoglobin: 10.3 g/dL — ABNORMAL LOW (ref 13.0–17.0)

## 2020-11-28 ENCOUNTER — Encounter (HOSPITAL_COMMUNITY)
Admission: RE | Admit: 2020-11-28 | Discharge: 2020-11-28 | Disposition: A | Discharge status: Home / Self Care | Payer: Medicare Other | Source: Ambulatory Visit | Attending: Nephrology | Admitting: Nephrology

## 2020-11-28 VITALS — BP 114/60 | HR 47 | Temp 98.0°F | Resp 18

## 2020-11-28 DIAGNOSIS — D631 Anemia in chronic kidney disease: Secondary | ICD-10-CM | POA: Diagnosis not present

## 2020-11-28 DIAGNOSIS — N183 Chronic kidney disease, stage 3 unspecified: Secondary | ICD-10-CM

## 2020-11-28 MED ORDER — EPOETIN ALFA-EPBX 10000 UNIT/ML IJ SOLN
INTRAMUSCULAR | Status: AC
  Filled 2020-11-28: qty 1

## 2020-11-28 MED ORDER — EPOETIN ALFA-EPBX 10000 UNIT/ML IJ SOLN
10000.0000 [IU] | INTRAMUSCULAR | Status: DC
  Administered 2020-11-28: 10000 [IU] via SUBCUTANEOUS

## 2020-11-29 LAB — POCT HEMOGLOBIN-HEMACUE: Hemoglobin: 10.7 g/dL — ABNORMAL LOW (ref 13.0–17.0)

## 2020-12-05 ENCOUNTER — Encounter (HOSPITAL_COMMUNITY)
Admission: RE | Admit: 2020-12-05 | Discharge: 2020-12-05 | Disposition: A | Discharge status: Home / Self Care | Payer: Medicare Other | Source: Ambulatory Visit | Attending: Nephrology | Admitting: Nephrology

## 2020-12-05 VITALS — BP 177/69 | HR 53 | Temp 98.2°F | Resp 18

## 2020-12-05 DIAGNOSIS — N183 Chronic kidney disease, stage 3 unspecified: Secondary | ICD-10-CM

## 2020-12-05 LAB — POCT HEMOGLOBIN-HEMACUE: Hemoglobin: 12.1 g/dL — ABNORMAL LOW (ref 13.0–17.0)

## 2020-12-05 MED ORDER — EPOETIN ALFA-EPBX 10000 UNIT/ML IJ SOLN
INTRAMUSCULAR | Status: AC
  Filled 2020-12-05: qty 1

## 2020-12-05 MED ORDER — EPOETIN ALFA-EPBX 10000 UNIT/ML IJ SOLN: 10000.0000 [IU] | INTRAMUSCULAR | Status: DC

## 2020-12-12 ENCOUNTER — Encounter (HOSPITAL_COMMUNITY): Payer: BC Managed Care – PPO

## 2020-12-19 ENCOUNTER — Other Ambulatory Visit: Payer: Self-pay

## 2020-12-19 ENCOUNTER — Encounter (HOSPITAL_COMMUNITY)
Admission: RE | Admit: 2020-12-19 | Discharge: 2020-12-19 | Disposition: A | Discharge status: Home / Self Care | Payer: Medicare Other | Source: Ambulatory Visit | Attending: Nephrology | Admitting: Nephrology

## 2020-12-19 VITALS — BP 110/54 | HR 66 | Temp 97.9°F | Resp 19

## 2020-12-19 DIAGNOSIS — N183 Chronic kidney disease, stage 3 unspecified: Secondary | ICD-10-CM

## 2020-12-19 LAB — POCT HEMOGLOBIN-HEMACUE: Hemoglobin: 11.1 g/dL — ABNORMAL LOW (ref 13.0–17.0)

## 2020-12-19 LAB — IRON AND TIBC
Iron: 60 ug/dL (ref 45–182)
Saturation Ratios: 24 % (ref 17.9–39.5)
TIBC: 246 ug/dL — ABNORMAL LOW (ref 250–450)
UIBC: 186 ug/dL

## 2020-12-19 LAB — FERRITIN: Ferritin: 57 ng/mL (ref 24–336)

## 2020-12-19 MED ORDER — EPOETIN ALFA-EPBX 10000 UNIT/ML IJ SOLN
10000.0000 [IU] | INTRAMUSCULAR | Status: DC
  Administered 2020-12-19: 10000 [IU] via SUBCUTANEOUS

## 2020-12-19 MED ORDER — EPOETIN ALFA-EPBX 10000 UNIT/ML IJ SOLN
INTRAMUSCULAR | Status: AC
  Filled 2020-12-19: qty 1

## 2020-12-26 ENCOUNTER — Encounter (HOSPITAL_COMMUNITY)
Admission: RE | Admit: 2020-12-26 | Discharge: 2020-12-26 | Disposition: A | Discharge status: Home / Self Care | Payer: Medicare Other | Source: Ambulatory Visit | Attending: Nephrology | Admitting: Nephrology

## 2020-12-26 VITALS — BP 117/47 | HR 67 | Temp 97.5°F | Resp 18

## 2020-12-26 DIAGNOSIS — D631 Anemia in chronic kidney disease: Secondary | ICD-10-CM | POA: Diagnosis not present

## 2020-12-26 DIAGNOSIS — N184 Chronic kidney disease, stage 4 (severe): Secondary | ICD-10-CM | POA: Diagnosis not present

## 2020-12-26 DIAGNOSIS — N183 Chronic kidney disease, stage 3 unspecified: Secondary | ICD-10-CM

## 2020-12-26 LAB — POCT HEMOGLOBIN-HEMACUE: Hemoglobin: 10.2 g/dL — ABNORMAL LOW (ref 13.0–17.0)

## 2020-12-26 MED ORDER — EPOETIN ALFA-EPBX 10000 UNIT/ML IJ SOLN
10000.0000 [IU] | INTRAMUSCULAR | Status: DC
  Administered 2020-12-26: 10000 [IU] via SUBCUTANEOUS

## 2020-12-26 MED ORDER — EPOETIN ALFA-EPBX 10000 UNIT/ML IJ SOLN
INTRAMUSCULAR | Status: AC
  Filled 2020-12-26: qty 1

## 2021-01-01 ENCOUNTER — Other Ambulatory Visit: Payer: Self-pay | Admitting: Cardiology

## 2021-01-01 DIAGNOSIS — I10 Essential (primary) hypertension: Secondary | ICD-10-CM

## 2021-01-02 ENCOUNTER — Other Ambulatory Visit: Payer: Self-pay

## 2021-01-02 ENCOUNTER — Encounter (HOSPITAL_COMMUNITY)
Admission: RE | Admit: 2021-01-02 | Discharge: 2021-01-02 | Disposition: A | Discharge status: Home / Self Care | Payer: Medicare Other | Source: Ambulatory Visit | Attending: Nephrology | Admitting: Nephrology

## 2021-01-02 VITALS — BP 158/93 | HR 65 | Temp 97.9°F | Resp 18

## 2021-01-02 DIAGNOSIS — N184 Chronic kidney disease, stage 4 (severe): Secondary | ICD-10-CM | POA: Diagnosis not present

## 2021-01-02 DIAGNOSIS — N183 Chronic kidney disease, stage 3 unspecified: Secondary | ICD-10-CM

## 2021-01-02 LAB — POCT HEMOGLOBIN-HEMACUE: Hemoglobin: 10.6 g/dL — ABNORMAL LOW (ref 13.0–17.0)

## 2021-01-02 MED ORDER — EPOETIN ALFA-EPBX 10000 UNIT/ML IJ SOLN
10000.0000 [IU] | INTRAMUSCULAR | Status: DC
  Administered 2021-01-02: 10000 [IU] via SUBCUTANEOUS

## 2021-01-02 MED ORDER — EPOETIN ALFA-EPBX 10000 UNIT/ML IJ SOLN
INTRAMUSCULAR | Status: AC
  Filled 2021-01-02: qty 1

## 2021-01-08 ENCOUNTER — Telehealth: Payer: Self-pay | Admitting: Cardiology

## 2021-01-09 ENCOUNTER — Encounter (HOSPITAL_COMMUNITY)
Admission: RE | Admit: 2021-01-09 | Discharge: 2021-01-09 | Disposition: A | Discharge status: Home / Self Care | Payer: Medicare Other | Source: Ambulatory Visit | Attending: Nephrology | Admitting: Nephrology

## 2021-01-09 ENCOUNTER — Other Ambulatory Visit: Payer: Self-pay

## 2021-01-09 VITALS — BP 109/59 | HR 56 | Temp 97.4°F | Resp 18

## 2021-01-09 DIAGNOSIS — N183 Chronic kidney disease, stage 3 unspecified: Secondary | ICD-10-CM

## 2021-01-09 DIAGNOSIS — N184 Chronic kidney disease, stage 4 (severe): Secondary | ICD-10-CM | POA: Diagnosis not present

## 2021-01-09 LAB — POCT HEMOGLOBIN-HEMACUE: Hemoglobin: 10 g/dL — ABNORMAL LOW (ref 13.0–17.0)

## 2021-01-09 MED ORDER — EPOETIN ALFA-EPBX 10000 UNIT/ML IJ SOLN
10000.0000 [IU] | INTRAMUSCULAR | Status: DC
  Administered 2021-01-09: 10000 [IU] via SUBCUTANEOUS

## 2021-01-09 MED ORDER — EPOETIN ALFA-EPBX 10000 UNIT/ML IJ SOLN
INTRAMUSCULAR | Status: AC
  Filled 2021-01-09: qty 1

## 2021-01-15 ENCOUNTER — Telehealth: Payer: Self-pay | Admitting: Pharmacist

## 2021-01-16 ENCOUNTER — Encounter (HOSPITAL_COMMUNITY)
Admission: RE | Admit: 2021-01-16 | Discharge: 2021-01-16 | Disposition: A | Discharge status: Home / Self Care | Payer: Medicare Other | Source: Ambulatory Visit | Attending: Nephrology | Admitting: Nephrology

## 2021-01-16 ENCOUNTER — Other Ambulatory Visit: Payer: Self-pay

## 2021-01-16 VITALS — BP 168/60 | HR 61 | Temp 97.9°F | Resp 18

## 2021-01-16 DIAGNOSIS — N184 Chronic kidney disease, stage 4 (severe): Secondary | ICD-10-CM | POA: Diagnosis not present

## 2021-01-16 DIAGNOSIS — N183 Chronic kidney disease, stage 3 unspecified: Secondary | ICD-10-CM

## 2021-01-16 LAB — IRON AND TIBC
Iron: 54 ug/dL (ref 45–182)
Saturation Ratios: 17 % — ABNORMAL LOW (ref 17.9–39.5)
TIBC: 309 ug/dL (ref 250–450)
UIBC: 255 ug/dL

## 2021-01-16 LAB — FERRITIN: Ferritin: 27 ng/mL (ref 24–336)

## 2021-01-16 LAB — POCT HEMOGLOBIN-HEMACUE: Hemoglobin: 11.1 g/dL — ABNORMAL LOW (ref 13.0–17.0)

## 2021-01-16 MED ORDER — EPOETIN ALFA-EPBX 10000 UNIT/ML IJ SOLN
INTRAMUSCULAR | Status: AC
  Filled 2021-01-16: qty 1

## 2021-01-16 MED ORDER — EPOETIN ALFA-EPBX 10000 UNIT/ML IJ SOLN
10000.0000 [IU] | INTRAMUSCULAR | Status: DC
  Administered 2021-01-16: 10000 [IU] via SUBCUTANEOUS

## 2021-01-16 NOTE — Telephone Encounter (Signed)
CARE PLAN ENTRY  01/16/2021 Name: Roy [name] MRN: [id number] DOB: 1935-12-26  Roy [name] is enrolled in Remote Patient Monitoring/Principle Care Monitoring.  Date of Enrollment: 11/18/2019 Supervising physician: Vernell Leep Indication: HTN  Remote Readings: Not Compliant and Avg BP: 158/88, HR:65  Next scheduled OV: 03/22/21  Pharmacist Clinical Goal(s):  Over the next 90 days, patient will demonstrate Improved medication adherence as evidenced by medication fill history Over the next 90 days, patient will demonstrate improved understanding of prescribed medications and rationale for usage as evidenced by patient teach back Over the next 90 days, patient will experience decrease in ED visits. ED visits in last 6 months = 0 Over the next 90 days, patient will not experience hospital admission. Hospital Admissions in last 6 months = 0  Interventions: Provider and Inter-disciplinary care team collaboration (see longitudinal plan of care) Comprehensive medication review performed. Discussed plans with patient for ongoing care management follow up and provided patient with direct contact information for care management team Collaboration with provider re: medication management  Patient Self Care Activities:  Self administers medications as prescribed Attends all scheduled provider appointments Performs ADL's independently Performs IADL's independently  No Known Allergies Outpatient Encounter Medications as of 01/15/2021  Medication Sig   cinacalcet (SENSIPAR) 30 MG tablet Take 30 mg by mouth daily.   CONTOUR NEXT TEST test strip daily.   diltiazem (CARDIZEM CD) 240 MG 24 hr capsule Take 240 mg by mouth daily.   ELIQUIS 2.5 MG TABS tablet Take 2.5 mg by mouth 2 (two) times daily.   finasteride (PROSCAR) 5 MG tablet Take 5 mg by mouth daily.   Fluticasone-Umeclidin-Vilant (TRELEGY ELLIPTA) 100-62.5-25 MCG/INH AEPB Inhale 1 puff into the lungs daily.    gabapentin (NEURONTIN) 100 MG capsule Take 100 mg by mouth daily.   hydrALAZINE (APRESOLINE) 50 MG tablet TAKE 1 TABLET BY MOUTH THREE TIMES DAILY   isosorbide dinitrate (ISORDIL) 30 MG tablet TAKE 1 TABLET BY MOUTH THREE TIMES DAILY   labetalol (NORMODYNE) 100 MG tablet Take 1 tablet (100 mg total) by mouth 2 (two) times daily.   methenamine (HIPREX) 1 g tablet Take 500 mg by mouth 2 (two) times daily with a meal.   Microlet Lancets MISC    pantoprazole (PROTONIX) 40 MG tablet Take 40 mg by mouth daily.   potassium chloride SA (K-DUR,KLOR-CON) 20 MEQ tablet Take 20 mEq by mouth every Monday, Wednesday, and Friday.   tamsulosin (FLOMAX) 0.4 MG CAPS capsule Take 1 capsule (0.4 mg total) by mouth daily.   torsemide (DEMADEX) 100 MG tablet Take 50 mg by mouth every Monday, Wednesday, and Friday. Currently taking 1 tabs Qday (10/18-11/1)   zaleplon (SONATA) 5 MG capsule Take 1 capsule (5 mg total) by mouth at bedtime as needed for sleep.   [DISCONTINUED] QUEtiapine (SEROQUEL) 50 MG tablet Take 50 mg by mouth at bedtime.  (Patient not taking: Reported on 01/15/2021)   No facility-administered encounter medications on file as of 01/15/2021.    Hypertension   BP goal is:  <130/80  Office blood pressures are  BP Readings from Last 3 Encounters:  01/16/21 (!) 168/60  01/09/21 (!) 109/59  01/02/21 (!) 158/93    Patient is currently uncontrolled on the following medications: Hydralazine 50 mg TID, Isordil 30 mg TID< Labetalol 100 mg BID, Torsemide 100 mg Qday (for 2 weeks), Diltiazem 240 mg.   Patient checks BP at home 3-5x per week  Patient home BP readings are ranging: 136-183/71-112  Patient has  tried  these meds in the past: Lisinopril 40 mg,   We discussed diet and exercise extensively and managing lower extremity edema concerns  Plan  Continue current medications and control with diet and exercise   Reviewed and updated med list together. Pt's BP recently trending up in setting of  recent Retacrit infusion. Known ADRs of hypertension. Pt also notes that he started experiencing increase lower extremity swelling. Left leg more prominent that right leg. Pt was seen by PCP office who recommend pt increase his torsemide dose from 50 mg M, W, F to 100 mg Qday for 2 weeks. Pt reports that lower extremity swelling still present, but improving slightly. Pt is scheduled for follow up with PCP next week. Pt agreeable to continue with torsemide 100 mg Qday for now and continue monitoring improvement in lower extremity swelling concerns. Recommend pt to wear compression stockings, reducing salt intake, and elevating his feet whenever possible. Pt notes that swelling worsens throughout the day, but better in the morning and able to put on the compression stockings on himself in the morning. Pt also notes that he eats out at [employer] and [employer]. Pt mostly eats sea food, mac and cheese, bread, collard green. Pt living by himself and doesn't normally cook at home. Reviewed with pt healthy plate model and recommended pt at least half of plate full of vegetable and avoiding added salt seasoning on top. Will continue close home BP monitoring and follow up. If swelling improves and BP continues to remain elevated, will consider increasing hydralazine dose to 100 mg TID.    ______________ Visit Information SDOH (Social Determinants of Health) assessments performed: Yes.  Roy [name] was given information about Principle Care Management/Remote Patient Monitoring services today including:  RPM/PCM service includes personalized support from designated clinical staff supervised by his physician, including individualized plan of care and coordination with other care providers 24/7 contact phone numbers for assistance for urgent and routine care needs. Standard insurance, coinsurance, copays and deductibles apply for principle care management only during months in which we provide at least 30 minutes  of these services. Most insurances cover these services at 100%, however patients may be responsible for any copay, coinsurance and/or deductible if applicable. This service may help you avoid the need for more expensive face-to-face services. Only one practitioner may furnish and bill the service in a calendar month. The patient may stop PCM/RPM services at any time (effective at the end of the month) by phone call to the office staff.  Patient agreed to services and verbal consent obtained.   Manuela Schwartz, Pharm.D. [facility] [phone] Ext: 120

## 2021-01-23 ENCOUNTER — Other Ambulatory Visit: Payer: Self-pay

## 2021-01-23 ENCOUNTER — Encounter (HOSPITAL_COMMUNITY)
Admission: RE | Admit: 2021-01-23 | Discharge: 2021-01-23 | Disposition: A | Discharge status: Home / Self Care | Payer: Medicare Other | Source: Ambulatory Visit | Attending: Nephrology | Admitting: Nephrology

## 2021-01-23 VITALS — BP 117/42 | HR 66 | Temp 97.7°F | Resp 18

## 2021-01-23 DIAGNOSIS — D631 Anemia in chronic kidney disease: Secondary | ICD-10-CM | POA: Diagnosis not present

## 2021-01-23 DIAGNOSIS — N183 Chronic kidney disease, stage 3 unspecified: Secondary | ICD-10-CM | POA: Insufficient documentation

## 2021-01-23 LAB — POCT HEMOGLOBIN-HEMACUE: Hemoglobin: 10.4 g/dL — ABNORMAL LOW (ref 13.0–17.0)

## 2021-01-23 MED ORDER — EPOETIN ALFA-EPBX 10000 UNIT/ML IJ SOLN
10000.0000 [IU] | INTRAMUSCULAR | Status: DC
  Administered 2021-01-23: 10000 [IU] via SUBCUTANEOUS

## 2021-01-23 MED ORDER — EPOETIN ALFA-EPBX 10000 UNIT/ML IJ SOLN
INTRAMUSCULAR | Status: AC
  Filled 2021-01-23: qty 1

## 2021-01-30 ENCOUNTER — Encounter (HOSPITAL_COMMUNITY)
Admission: RE | Admit: 2021-01-30 | Discharge: 2021-01-30 | Disposition: A | Discharge status: Home / Self Care | Payer: Medicare Other | Source: Ambulatory Visit | Attending: Nephrology | Admitting: Nephrology

## 2021-01-30 ENCOUNTER — Other Ambulatory Visit: Payer: Self-pay

## 2021-01-30 VITALS — BP 176/77 | HR 63 | Temp 97.5°F | Resp 18

## 2021-01-30 DIAGNOSIS — N183 Chronic kidney disease, stage 3 unspecified: Secondary | ICD-10-CM

## 2021-01-30 LAB — POCT HEMOGLOBIN-HEMACUE: Hemoglobin: 10.5 g/dL — ABNORMAL LOW (ref 13.0–17.0)

## 2021-01-30 MED ORDER — EPOETIN ALFA-EPBX 10000 UNIT/ML IJ SOLN
INTRAMUSCULAR | Status: AC
  Filled 2021-01-30: qty 1

## 2021-01-30 MED ORDER — EPOETIN ALFA-EPBX 10000 UNIT/ML IJ SOLN
10000.0000 [IU] | INTRAMUSCULAR | Status: DC
  Administered 2021-01-30: 10000 [IU] via SUBCUTANEOUS

## 2021-02-06 ENCOUNTER — Other Ambulatory Visit: Payer: Self-pay

## 2021-02-06 ENCOUNTER — Encounter (HOSPITAL_COMMUNITY)
Admission: RE | Admit: 2021-02-06 | Discharge: 2021-02-06 | Disposition: A | Discharge status: Home / Self Care | Payer: Medicare Other | Source: Ambulatory Visit | Attending: Nephrology | Admitting: Nephrology

## 2021-02-06 VITALS — BP 149/71 | HR 70 | Temp 98.6°F | Resp 18

## 2021-02-06 DIAGNOSIS — N183 Chronic kidney disease, stage 3 unspecified: Secondary | ICD-10-CM | POA: Diagnosis not present

## 2021-02-06 LAB — POCT HEMOGLOBIN-HEMACUE: Hemoglobin: 10.3 g/dL — ABNORMAL LOW (ref 13.0–17.0)

## 2021-02-06 MED ORDER — EPOETIN ALFA-EPBX 10000 UNIT/ML IJ SOLN
INTRAMUSCULAR | Status: AC
  Administered 2021-02-06: 10000 [IU] via SUBCUTANEOUS
  Filled 2021-02-06: qty 1

## 2021-02-06 MED ORDER — EPOETIN ALFA-EPBX 10000 UNIT/ML IJ SOLN: 10000.0000 [IU] | INTRAMUSCULAR | Status: DC

## 2021-02-07 ENCOUNTER — Ambulatory Visit: Payer: Medicare Other | Admitting: Podiatry

## 2021-02-09 ENCOUNTER — Other Ambulatory Visit: Payer: Self-pay

## 2021-02-09 ENCOUNTER — Ambulatory Visit: Payer: Medicare Other | Admitting: Podiatry

## 2021-02-09 ENCOUNTER — Encounter: Payer: Self-pay | Admitting: Podiatry

## 2021-02-09 DIAGNOSIS — M79675 Pain in left toe(s): Secondary | ICD-10-CM | POA: Diagnosis not present

## 2021-02-09 DIAGNOSIS — M79674 Pain in right toe(s): Secondary | ICD-10-CM

## 2021-02-09 DIAGNOSIS — B351 Tinea unguium: Secondary | ICD-10-CM

## 2021-02-09 DIAGNOSIS — R600 Localized edema: Secondary | ICD-10-CM | POA: Diagnosis not present

## 2021-02-09 DIAGNOSIS — M2141 Flat foot [pes planus] (acquired), right foot: Secondary | ICD-10-CM | POA: Diagnosis not present

## 2021-02-09 DIAGNOSIS — E119 Type 2 diabetes mellitus without complications: Secondary | ICD-10-CM

## 2021-02-09 DIAGNOSIS — Z9181 History of falling: Secondary | ICD-10-CM

## 2021-02-09 DIAGNOSIS — E1142 Type 2 diabetes mellitus with diabetic polyneuropathy: Secondary | ICD-10-CM

## 2021-02-09 DIAGNOSIS — M2142 Flat foot [pes planus] (acquired), left foot: Secondary | ICD-10-CM

## 2021-02-09 NOTE — Patient Instructions (Signed)
Call back after Thanksgiving to schedule diabetic shoe appointment for January 2023.  Diabetes Mellitus and Foot Care Foot care is an important part of your health, especially when you have diabetes. Diabetes may cause you to have problems because of poor blood flow (circulation) to your feet and legs, which can cause your skin to: Become thinner and drier. Break more easily. Heal more slowly. Peel and crack. You may also have nerve damage (neuropathy) in your legs and feet, causing decreased feeling in them. This means that you may not notice minor injuries to your feet that could lead to more serious problems. Noticing and addressing any potential problems early is the best way to prevent future foot problems. How to care for your feet Foot hygiene  Wash your feet daily with warm water and mild soap. Do not use hot water. Then, pat your feet and the areas between your toes until they are completely dry. Do not soak your feet as this can dry your skin. Trim your toenails straight across. Do not dig under them or around the cuticle. File the edges of your nails with an emery board or nail file. Apply a moisturizing lotion or petroleum jelly to the skin on your feet and to dry, brittle toenails. Use lotion that does not contain alcohol and is unscented. Do not apply lotion between your toes. Shoes and socks Wear clean socks or stockings every day. Make sure they are not too tight. Do not wear knee-high stockings since they may decrease blood flow to your legs. Wear shoes that fit properly and have enough cushioning. Always look in your shoes before you put them on to be sure there are no objects inside. To break in new shoes, wear them for just a few hours a day. This prevents injuries on your feet. Wounds, scrapes, corns, and calluses  Check your feet daily for blisters, cuts, bruises, sores, and redness. If you cannot see the bottom of your feet, use a mirror or ask someone for help. Do not  cut corns or calluses or try to remove them with medicine. If you find a minor scrape, cut, or break in the skin on your feet, keep it and the skin around it clean and dry. You may clean these areas with mild soap and water. Do not clean the area with peroxide, alcohol, or iodine. If you have a wound, scrape, corn, or callus on your foot, look at it several times a day to make sure it is healing and not infected. Check for: Redness, swelling, or pain. Fluid or blood. Warmth. Pus or a bad smell. General tips Do not cross your legs. This may decrease blood flow to your feet. Do not use heating pads or hot water bottles on your feet. They may burn your skin. If you have lost feeling in your feet or legs, you may not know this is happening until it is too late. Protect your feet from hot and cold by wearing shoes, such as at the beach or on hot pavement. Schedule a complete foot exam at least once a year (annually) or more often if you have foot problems. Report any cuts, sores, or bruises to your health care provider immediately. Where to find more information American Diabetes Association: www.diabetes.org Association of Diabetes Care & Education Specialists: www.diabeteseducator.org Contact a health care provider if: You have a medical condition that increases your risk of infection and you have any cuts, sores, or bruises on your feet. You have an injury  that is not healing. You have redness on your legs or feet. You feel burning or tingling in your legs or feet. You have pain or cramps in your legs and feet. Your legs or feet are numb. Your feet always feel cold. You have pain around any toenails. Get help right away if: You have a wound, scrape, corn, or callus on your foot and: You have pain, swelling, or redness that gets worse. You have fluid or blood coming from the wound, scrape, corn, or callus. Your wound, scrape, corn, or callus feels warm to the touch. You have pus or a bad  smell coming from the wound, scrape, corn, or callus. You have a fever. You have a red line going up your leg. Summary Check your feet every day for blisters, cuts, bruises, sores, and redness. Apply a moisturizing lotion or petroleum jelly to the skin on your feet and to dry, brittle toenails. Wear shoes that fit properly and have enough cushioning. If you have foot problems, report any cuts, sores, or bruises to your health care provider immediately. Schedule a complete foot exam at least once a year (annually) or more often if you have foot problems. This information is not intended to replace advice given to you by your health care provider. Make sure you discuss any questions you have with your health care provider. Document Revised: 09/30/2019 Document Reviewed: 09/30/2019 Elsevier Patient Education  2022 [place] for Falls Each year, millions of people have serious injuries from falls. It is important to understand your risk for falling. Talk with your health care provider about your risk and what you can do to lower it. There are actions you can take at home to lower your risk and prevent falls. If you do have a serious fall, make sure to tell your health care provider. Falling once raises your risk of falling again. How can falls affect me? Serious injuries from falls are common. These include: Broken bones, such as hip fractures. Head injuries, such as traumatic brain injuries (TBI) or concussion. A fear of falling can cause you to avoid activities and stay at home. This can make your muscles weaker and actually raise your risk for a fall. What can increase my risk? There are a number of risk factors that increase your risk for falling. The more risk factors you have, the higher your risk of falling. Serious injuries from a fall happen most often to people older than age 16. Children and young adults ages 16-29 are also at higher risk. Common risk  factors include: Weakness in the lower body. Lack (deficiency) of vitamin D. Being generally weak or confused due to long-term (chronic) illness. Dizziness or balance problems. Poor vision. Medicines that cause dizziness or drowsiness. These can include medicines for your blood pressure, heart, anxiety, insomnia, or edema, as well as pain medicines and muscle relaxants. Other risk factors include: Drinking alcohol. Having had a fall in the past. Having depression. Having foot pain or wearing improper footwear. Working at a dangerous job. Having any of the following in your home: Tripping hazards, such as floor clutter or loose rugs. Poor lighting. Pets. Having dementia or memory loss. What actions can I take to lower my risk of falling?   Physical activity Maintain physical fitness. Do strength and balance exercises. Consider taking a regular class to build strength and balance. Yoga and tai chi are good options. Vision Have your eyes checked every year and your vision prescription updated  as needed. Walking aids and footwear Wear nonskid shoes. Do not wear high heels. Do not walk around the house in socks or slippers. Use a cane or walker as told by your health care provider. Home safety Attach secure railings on both sides of your stairs. Install grab bars for your tub, shower, and toilet. Use a bath mat in your tub or shower. Use good lighting in all rooms. Keep a flashlight near your bed. Make sure there is a clear path from your bed to the bathroom. Use night-lights. Do not use throw rugs. Make sure all carpeting is taped or tacked down securely. Remove all clutter from walkways and stairways, including extension cords. Repair uneven or broken steps. Avoid walking on icy or slippery surfaces. Walk on the grass instead of on icy or slick sidewalks. Use ice melt to get rid of ice on walkways. Use a cordless phone. Questions to ask your health care provider Can you help me  check my risk for a fall? Do any of my medicines make me more likely to fall? Should I take a vitamin D supplement? What exercises can I do to improve my strength and balance? Should I make an appointment to have my vision checked? Do I need a bone density test to check for weak bones or osteoporosis? Would it help to use a cane or a walker? Where to find more information Centers for Disease Control and Prevention, STEAD[employer]: http://www.wolf.info/ Community-Based Fall Prevention Programs: http://www.wolf.info/ National Institute on Aging: http://kim-miller.com/ Contact a health care provider if: You fall at home. You are afraid of falling at home. You feel weak, drowsy, or dizzy. Summary Serious injuries from a fall happen most often to people older than age 40. Children and young adults ages 7-29 are also at higher risk. Talk with your health care provider about your risks for falling and how to lower those risks. Taking certain precautions at home can lower your risk for falling. If you fall, always tell your health care provider. This information is not intended to replace advice given to you by your health care provider. Make sure you discuss any questions you have with your health care provider. Document Revised: 10/13/2019 Document Reviewed: 10/13/2019 Elsevier Patient Education  [place].

## 2021-02-09 NOTE — Progress Notes (Signed)
ANNUAL DIABETIC FOOT EXAM  Subjective: Roy [name] presents today for for annual diabetic foot examination.  Patient relates 20 year h/o diabetes.  Patient denies any h/o foot wounds.  Patient denies any numbness, tingling, burning, or pins/needle sensation in feet.  Patient did not check blood glucose this morning.  Rogers Blocker, MD is patient's PCP. Last visit was July, 2022.  Patient states he had a fall a couple of weeks ago. He was taking a package to a neighbors house and tripped on the neighbors step. He phoned his son who came and helped him up. He states he did hit his head above his eye and has bruising there. He denies loss of consciousness, denies headache and denies dizziness. He did not notify PCP nor go to hospital.  Past Medical History:  Diagnosis Date   A-fib ([place])    Arthritis HANDS/ THUMBS/ SHOULDERS   Bladder calculi    Chronic obstructive asthma, unspecified FOLLOWED BY DR Tarri Fuller YOUNG  VISIT 10-14-2011 IN [facility]   COPD (chronic obstructive pulmonary disease) ([place])    Cyst on ear    left   Diabetes mellitus    DVT (deep venous thrombosis) (H[facility])    Dyspnea on exertion    GERD (gastroesophageal reflux disease)    Hematuria    History of prostate cancer 2010-- S/P  EXTERNAL RADIATION   Hydronephrosis, left    Hypertension    OSA on CPAP    wears CPAP   Sepsis ([place])    Wears glasses    Patient Active Problem List   Diagnosis Date Noted   Pulmonary hypertension, unspecified ([place]) 12/17/2019   Persistent atrial fibrillation ([place]) 12/17/2019   Leg edema 11/18/2019   Exertional dyspnea 11/18/2019   Infected cyst of skin 01/25/2016   Abscess of external ear, left 01/11/2016   Acute deep vein thrombosis (DVT) of femoral vein of left lower extremity ([place])    Bacteremia due to Enterococcus 10/09/2015   CKD (chronic kidney disease) stage 3, GFR 30-59 ml/min (HCC) 10/06/2015   AKI (acute kidney injury) ([place]) 10/06/2015   Sepsis secondary to UTI ([place])  10/06/2015   UTI (lower urinary tract infection) 10/06/2015   Benign prostatic hyperplasia with urinary obstruction 07/24/2015   DM2 (diabetes mellitus, type 2) ([place]) 01/06/2007   EXOGENOUS OBESITY 01/06/2007   OSA on CPAP 01/06/2007   Essential hypertension 01/06/2007   Chronic obstructive airway disease with asthma ([place]) 01/06/2007   Past Surgical History:  Procedure Laterality Date   CARPAL TUNNEL RELEASE  05-28-2005   LEFT   CYSTOSCOPY N/A 09/26/2012   Procedure: CYSTOSCOPY, clot evacuation,fulgeration of bladder, bladder biopsy, transurethreal vaporation of prostat;  Surgeon: Alexis Frock, MD;  Location: [facility];  Service: Urology;  Laterality: N/A;   CYSTOSCOPY W/ RETROGRADES Bilateral 10/14/2015   Procedure: CYSTOSCOPY WITH BILATERAL RETROGRADE PYELOGRAM/ FULGERATION WITH LITHOLAPAXY;  Surgeon: Festus Aloe, MD;  Location: [facility];  Service: Urology;  Laterality: Bilateral;   CYSTOSCOPY WITH LITHOLAPAXY  10/18/2011   Procedure: CYSTOSCOPY WITH LITHOLAPAXY;  Surgeon: Hanley Ben, MD;  Location: [facility];  Service: Urology;  Laterality: N/A;  1 HOUR NO STENT H# [id number] CELL# [phone]  MEDICARE BCBS   EAR CYST EXCISION Left 02/23/2016   Procedure: EXCISION LEFT EAR CYST;  Surgeon: Izora Gala, MD;  Location: [facility];  Service: ENT;  Laterality: Left;   GANGLION CYST EXCISION  1988  (APPROX)   [place]   TEE WITHOUT CARDIOVERSION N/A  10/11/2015   Procedure: TRANSESOPHAGEAL ECHOCARDIOGRAM (TEE);  Surgeon: Sanda Klein, MD;  Location: [facility];  Service: Cardiovascular;  Laterality: N/A;   Current Outpatient Medications on File Prior to Visit  Medication Sig Dispense Refill   cinacalcet (SENSIPAR) 30 MG tablet Take 30 mg by mouth daily.     CONTOUR NEXT TEST test strip daily.     diltiazem (CARDIZEM CD) 240 MG 24 hr capsule Take 240 mg by mouth daily.     ELIQUIS 2.5 MG TABS tablet Take 2.5 mg by mouth 2 (two) times  daily.     finasteride (PROSCAR) 5 MG tablet Take 5 mg by mouth daily.     Fluticasone-Umeclidin-Vilant (TRELEGY ELLIPTA) 100-62.5-25 MCG/INH AEPB Inhale 1 puff into the lungs daily. 14 each 0   gabapentin (NEURONTIN) 100 MG capsule Take 100 mg by mouth daily.     hydrALAZINE (APRESOLINE) 50 MG tablet TAKE 1 TABLET BY MOUTH THREE TIMES DAILY 90 tablet 0   isosorbide dinitrate (ISORDIL) 30 MG tablet TAKE 1 TABLET BY MOUTH THREE TIMES DAILY 90 tablet 0   labetalol (NORMODYNE) 100 MG tablet Take 1 tablet (100 mg total) by mouth 2 (two) times daily. 60 tablet 5   methenamine (HIPREX) 1 g tablet Take 500 mg by mouth 2 (two) times daily with a meal.     Microlet Lancets MISC      pantoprazole (PROTONIX) 40 MG tablet Take 40 mg by mouth daily.     potassium chloride SA (K-DUR,KLOR-CON) 20 MEQ tablet Take 20 mEq by mouth every Monday, Wednesday, and Friday.     tamsulosin (FLOMAX) 0.4 MG CAPS capsule Take 1 capsule (0.4 mg total) by mouth daily. 30 capsule 1   torsemide (DEMADEX) 100 MG tablet Take 50 mg by mouth every Monday, Wednesday, and Friday. Currently taking 1 tabs Qday (10/18-11/1)     zaleplon (SONATA) 5 MG capsule Take 1 capsule (5 mg total) by mouth at bedtime as needed for sleep. 30 capsule 5   No current facility-administered medications on file prior to visit.    No Known Allergies Social History   Occupational History   Occupation: Retired    Comment: formerly Production assistant, radio  Tobacco Use   Smoking status: Former    Packs/day: 0.50    Years: 25.00    Pack years: 12.50    Types: Cigarettes    Quit date: 03/25/1998    Years since quitting: 22.9   Smokeless tobacco: Never  Vaping Use   Vaping Use: Never used  Substance and Sexual Activity   Alcohol use: Not Currently   Drug use: No   Sexual activity: Not on file   Family History  Problem Relation Age of Onset   Diabetes Father    Immunization History  Administered Date(s) Administered   Influenza Split 12/24/2010, 12/23/2013    Influenza Whole 02/25/2012   Influenza, High Dose Seasonal PF 02/10/2013, 02/15/2018, 12/29/2018   Moderna Sars-Covid-2 Vaccination 05/07/2019, 06/04/2019, 01/18/2020     Review of Systems: Negative except as noted in the HPI.   Objective: There were no vitals filed for this visit.  KANTON KAMEL is a pleasant 85 y.o. male in NAD. AAO X 3.  Vascular Examination: CFT <3 seconds b/l LE. Palpable DP pulse(s) b/l LE. Faintly palpable PT pulse(s) b/l LE. Pedal hair absent. No pain with calf compression b/l. Patient wearing compression hose on today's visit. +2 pitting edema BLE. Evidence of chronic venous insufficiency b/l LE. No ischemia or gangrene noted b/l LE. No cyanosis  or clubbing noted b/l LE.  Dermatological Examination: Pedal skin is warm and supple b/l LE. No open wounds b/l LE. No interdigital macerations noted b/l LE. Toenails 1-5 b/l elongated, discolored, dystrophic, thickened, crumbly with subungual debris and tenderness to dorsal palpation.  Musculoskeletal Examination: Muscle strength 5/5 to all lower extremity muscle groups bilaterally. Hammertoe(s) noted to the L 5th toe and R 5th toe. Pes planus deformity noted bilateral LE. Utilizes cane for ambulation assistance.  Footwear Assessment: Does the patient wear appropriate shoes? Yes. Does the patient need inserts/orthotics? Yes.  Neurological Examination: Pt has subjective symptoms of neuropathy. Protective sensation intact 5/5 intact bilaterally with 10g monofilament b/l. Vibratory sensation intact b/l.  Assessment: 1. Pain due to onychomycosis of toenails of both feet   2. History of recent fall   3. Lower extremity edema   4. Pes planus of both feet   5. Diabetic peripheral neuropathy associated with type 2 diabetes mellitus ([place])   6. Encounter for diabetic foot exam ([place])      ADA Risk Categorization: Low Risk :  Patient has all of the following: Intact protective sensation No prior foot ulcer  No  severe deformity Pedal pulses present  Plan: -Examined patient. -Advised patient to notify PCP of fall. -Diabetic foot examination performed today. -Continue foot and shoe inspections daily. Monitor blood glucose per PCP/Endocrinologist's recommendations. -Patient to continue soft, supportive shoe gear daily. Start procedure for diabetic shoes. Patient qualifies based on diagnoses. -Mycotic toenails 1-5 bilaterally were debrided in length and girth with sterile nail nippers and dremel without incident. -Patient/POA to call should there be question/concern in the interim.  Return in about 3 months (around 05/12/2021).  Marzetta Board, DPM

## 2021-02-12 ENCOUNTER — Encounter (HOSPITAL_COMMUNITY): Payer: Self-pay

## 2021-02-12 ENCOUNTER — Other Ambulatory Visit: Payer: Self-pay | Admitting: Internal Medicine

## 2021-02-12 ENCOUNTER — Ambulatory Visit
Admission: RE | Admit: 2021-02-12 | Discharge: 2021-02-12 | Disposition: A | Discharge status: Home / Self Care | Payer: Medicare Other | Source: Ambulatory Visit | Attending: Internal Medicine | Admitting: Internal Medicine

## 2021-02-12 DIAGNOSIS — W19XXXA Unspecified fall, initial encounter: Secondary | ICD-10-CM

## 2021-02-13 ENCOUNTER — Other Ambulatory Visit: Payer: Self-pay

## 2021-02-13 ENCOUNTER — Encounter (HOSPITAL_COMMUNITY)
Admission: RE | Admit: 2021-02-13 | Discharge: 2021-02-13 | Disposition: A | Discharge status: Home / Self Care | Payer: Medicare Other | Source: Ambulatory Visit | Attending: Nephrology | Admitting: Nephrology

## 2021-02-13 VITALS — BP 150/85 | HR 65 | Temp 97.7°F | Resp 18

## 2021-02-13 DIAGNOSIS — N183 Chronic kidney disease, stage 3 unspecified: Secondary | ICD-10-CM | POA: Diagnosis not present

## 2021-02-13 LAB — FERRITIN: Ferritin: 34 ng/mL (ref 24–336)

## 2021-02-13 LAB — IRON AND TIBC
Iron: 40 ug/dL — ABNORMAL LOW (ref 45–182)
Saturation Ratios: 13 % — ABNORMAL LOW (ref 17.9–39.5)
TIBC: 302 ug/dL (ref 250–450)
UIBC: 262 ug/dL

## 2021-02-13 LAB — POCT HEMOGLOBIN-HEMACUE: Hemoglobin: 10.4 g/dL — ABNORMAL LOW (ref 13.0–17.0)

## 2021-02-13 MED ORDER — EPOETIN ALFA-EPBX 10000 UNIT/ML IJ SOLN
INTRAMUSCULAR | Status: AC
  Administered 2021-02-13: 10000 [IU] via SUBCUTANEOUS
  Filled 2021-02-13: qty 1

## 2021-02-13 MED ORDER — EPOETIN ALFA-EPBX 10000 UNIT/ML IJ SOLN: 10000.0000 [IU] | INTRAMUSCULAR | Status: DC

## 2021-02-20 ENCOUNTER — Inpatient Hospital Stay (HOSPITAL_COMMUNITY)
Admission: RE | Admit: 2021-02-20 | Discharge: 2021-02-20 | Disposition: A | Discharge status: Home / Self Care | Payer: Medicare Other | Source: Ambulatory Visit | Attending: Nephrology | Admitting: Nephrology

## 2021-02-20 ENCOUNTER — Other Ambulatory Visit: Payer: Self-pay

## 2021-02-20 ENCOUNTER — Inpatient Hospital Stay (HOSPITAL_COMMUNITY)
Admission: EM | Admit: 2021-02-20 | Discharge: 2021-02-26 | DRG: 683 | Disposition: A | Discharge status: Skilled Nursing Facility | Payer: Medicare Other | Attending: Internal Medicine | Admitting: Internal Medicine

## 2021-02-20 ENCOUNTER — Emergency Department (HOSPITAL_COMMUNITY): Payer: Medicare Other

## 2021-02-20 ENCOUNTER — Encounter (HOSPITAL_COMMUNITY): Payer: Self-pay | Admitting: Emergency Medicine

## 2021-02-20 DIAGNOSIS — M19042 Primary osteoarthritis, left hand: Secondary | ICD-10-CM | POA: Diagnosis present

## 2021-02-20 DIAGNOSIS — Z79899 Other long term (current) drug therapy: Secondary | ICD-10-CM

## 2021-02-20 DIAGNOSIS — R55 Syncope and collapse: Secondary | ICD-10-CM | POA: Diagnosis not present

## 2021-02-20 DIAGNOSIS — E119 Type 2 diabetes mellitus without complications: Secondary | ICD-10-CM

## 2021-02-20 DIAGNOSIS — D72829 Elevated white blood cell count, unspecified: Secondary | ICD-10-CM

## 2021-02-20 DIAGNOSIS — N1832 Chronic kidney disease, stage 3b: Secondary | ICD-10-CM | POA: Diagnosis present

## 2021-02-20 DIAGNOSIS — Z20822 Contact with and (suspected) exposure to covid-19: Secondary | ICD-10-CM | POA: Diagnosis present

## 2021-02-20 DIAGNOSIS — I4819 Other persistent atrial fibrillation: Secondary | ICD-10-CM | POA: Diagnosis present

## 2021-02-20 DIAGNOSIS — Y92019 Unspecified place in single-family (private) house as the place of occurrence of the external cause: Secondary | ICD-10-CM

## 2021-02-20 DIAGNOSIS — I272 Pulmonary hypertension, unspecified: Secondary | ICD-10-CM | POA: Diagnosis present

## 2021-02-20 DIAGNOSIS — Z833 Family history of diabetes mellitus: Secondary | ICD-10-CM

## 2021-02-20 DIAGNOSIS — I5042 Chronic combined systolic (congestive) and diastolic (congestive) heart failure: Secondary | ICD-10-CM

## 2021-02-20 DIAGNOSIS — E876 Hypokalemia: Secondary | ICD-10-CM

## 2021-02-20 DIAGNOSIS — Z8546 Personal history of malignant neoplasm of prostate: Secondary | ICD-10-CM

## 2021-02-20 DIAGNOSIS — Z7901 Long term (current) use of anticoagulants: Secondary | ICD-10-CM

## 2021-02-20 DIAGNOSIS — N183 Chronic kidney disease, stage 3 unspecified: Secondary | ICD-10-CM

## 2021-02-20 DIAGNOSIS — R339 Retention of urine, unspecified: Secondary | ICD-10-CM | POA: Diagnosis not present

## 2021-02-20 DIAGNOSIS — A084 Viral intestinal infection, unspecified: Secondary | ICD-10-CM | POA: Diagnosis present

## 2021-02-20 DIAGNOSIS — R531 Weakness: Secondary | ICD-10-CM

## 2021-02-20 DIAGNOSIS — G4733 Obstructive sleep apnea (adult) (pediatric): Secondary | ICD-10-CM | POA: Diagnosis present

## 2021-02-20 DIAGNOSIS — R7989 Other specified abnormal findings of blood chemistry: Secondary | ICD-10-CM

## 2021-02-20 DIAGNOSIS — E860 Dehydration: Secondary | ICD-10-CM | POA: Diagnosis present

## 2021-02-20 DIAGNOSIS — K219 Gastro-esophageal reflux disease without esophagitis: Secondary | ICD-10-CM | POA: Diagnosis present

## 2021-02-20 DIAGNOSIS — S0083XA Contusion of other part of head, initial encounter: Principal | ICD-10-CM | POA: Diagnosis present

## 2021-02-20 DIAGNOSIS — S0011XA Contusion of right eyelid and periocular area, initial encounter: Secondary | ICD-10-CM | POA: Diagnosis present

## 2021-02-20 DIAGNOSIS — Z7951 Long term (current) use of inhaled steroids: Secondary | ICD-10-CM

## 2021-02-20 DIAGNOSIS — Z66 Do not resuscitate: Secondary | ICD-10-CM | POA: Diagnosis not present

## 2021-02-20 DIAGNOSIS — I5032 Chronic diastolic (congestive) heart failure: Secondary | ICD-10-CM | POA: Diagnosis present

## 2021-02-20 DIAGNOSIS — Z87891 Personal history of nicotine dependence: Secondary | ICD-10-CM

## 2021-02-20 DIAGNOSIS — Z86718 Personal history of other venous thrombosis and embolism: Secondary | ICD-10-CM

## 2021-02-20 DIAGNOSIS — W19XXXA Unspecified fall, initial encounter: Secondary | ICD-10-CM | POA: Diagnosis present

## 2021-02-20 DIAGNOSIS — Z9989 Dependence on other enabling machines and devices: Secondary | ICD-10-CM

## 2021-02-20 DIAGNOSIS — N179 Acute kidney failure, unspecified: Secondary | ICD-10-CM | POA: Diagnosis not present

## 2021-02-20 DIAGNOSIS — I10 Essential (primary) hypertension: Secondary | ICD-10-CM | POA: Diagnosis present

## 2021-02-20 DIAGNOSIS — D631 Anemia in chronic kidney disease: Secondary | ICD-10-CM | POA: Diagnosis present

## 2021-02-20 DIAGNOSIS — N138 Other obstructive and reflux uropathy: Secondary | ICD-10-CM | POA: Diagnosis present

## 2021-02-20 DIAGNOSIS — R296 Repeated falls: Secondary | ICD-10-CM | POA: Diagnosis present

## 2021-02-20 DIAGNOSIS — N390 Urinary tract infection, site not specified: Secondary | ICD-10-CM | POA: Diagnosis not present

## 2021-02-20 DIAGNOSIS — K5900 Constipation, unspecified: Secondary | ICD-10-CM | POA: Diagnosis not present

## 2021-02-20 DIAGNOSIS — I130 Hypertensive heart and chronic kidney disease with heart failure and stage 1 through stage 4 chronic kidney disease, or unspecified chronic kidney disease: Secondary | ICD-10-CM | POA: Diagnosis present

## 2021-02-20 DIAGNOSIS — I959 Hypotension, unspecified: Secondary | ICD-10-CM | POA: Diagnosis not present

## 2021-02-20 DIAGNOSIS — J449 Chronic obstructive pulmonary disease, unspecified: Secondary | ICD-10-CM | POA: Diagnosis present

## 2021-02-20 DIAGNOSIS — E1122 Type 2 diabetes mellitus with diabetic chronic kidney disease: Secondary | ICD-10-CM | POA: Diagnosis present

## 2021-02-20 DIAGNOSIS — R197 Diarrhea, unspecified: Secondary | ICD-10-CM

## 2021-02-20 DIAGNOSIS — N401 Enlarged prostate with lower urinary tract symptoms: Secondary | ICD-10-CM | POA: Diagnosis present

## 2021-02-20 DIAGNOSIS — M19041 Primary osteoarthritis, right hand: Secondary | ICD-10-CM | POA: Diagnosis present

## 2021-02-20 LAB — HEPATITIS PANEL, ACUTE
HCV Ab: NONREACTIVE
Hep A IgM: NONREACTIVE
Hep B C IgM: NONREACTIVE
Hepatitis B Surface Ag: NONREACTIVE

## 2021-02-20 LAB — CBC
HCT: 39.5 % (ref 39.0–52.0)
Hemoglobin: 12.7 g/dL — ABNORMAL LOW (ref 13.0–17.0)
MCH: 29.8 pg (ref 26.0–34.0)
MCHC: 32.2 g/dL (ref 30.0–36.0)
MCV: 92.7 fL (ref 80.0–100.0)
Platelets: 188 10*3/uL (ref 150–400)
RBC: 4.26 MIL/uL (ref 4.22–5.81)
RDW: 15.1 % (ref 11.5–15.5)
WBC: 15.1 10*3/uL — ABNORMAL HIGH (ref 4.0–10.5)
nRBC: 0 % (ref 0.0–0.2)

## 2021-02-20 LAB — CBG MONITORING, ED
Glucose-Capillary: 149 mg/dL — ABNORMAL HIGH (ref 70–99)
Glucose-Capillary: 110 mg/dL — ABNORMAL HIGH (ref 70–99)

## 2021-02-20 LAB — COMPREHENSIVE METABOLIC PANEL
ALT: 54 U/L — ABNORMAL HIGH (ref 0–44)
AST: 59 U/L — ABNORMAL HIGH (ref 15–41)
Albumin: 2.6 g/dL — ABNORMAL LOW (ref 3.5–5.0)
Alkaline Phosphatase: 87 U/L (ref 38–126)
Anion gap: 10 (ref 5–15)
BUN: 66 mg/dL — ABNORMAL HIGH (ref 8–23)
CO2: 26 mmol/L (ref 22–32)
Calcium: 9.5 mg/dL (ref 8.9–10.3)
Chloride: 103 mmol/L (ref 98–111)
Creatinine, Ser: 3.08 mg/dL — ABNORMAL HIGH (ref 0.61–1.24)
GFR, Estimated: 19 mL/min — ABNORMAL LOW (ref >60)
Glucose, Bld: 134 mg/dL — ABNORMAL HIGH (ref 70–99)
Potassium: 3 mmol/L — ABNORMAL LOW (ref 3.5–5.1)
Sodium: 139 mmol/L (ref 135–145)
Total Bilirubin: 1.4 mg/dL — ABNORMAL HIGH (ref 0.3–1.2)
Total Protein: 5.1 g/dL — ABNORMAL LOW (ref 6.5–8.1)

## 2021-02-20 LAB — TSH: TSH: 0.723 u[IU]/mL (ref 0.350–4.500)

## 2021-02-20 LAB — RESP PANEL BY RT-PCR (FLU A&B, COVID) ARPGX2
Influenza A by PCR: NEGATIVE
Influenza B by PCR: NEGATIVE
SARS Coronavirus 2 by RT PCR: NEGATIVE

## 2021-02-20 LAB — MAGNESIUM: Magnesium: 1.7 mg/dL (ref 1.7–2.4)

## 2021-02-20 LAB — CK: Total CK: 496 U/L — ABNORMAL HIGH (ref 49–397)

## 2021-02-20 MED ORDER — APIXABAN 2.5 MG PO TABS
2.5000 mg | ORAL_TABLET | Freq: Two times a day (BID) | ORAL | Status: DC
  Administered 2021-02-20 – 2021-02-26 (×12): 2.5 mg via ORAL
  Filled 2021-02-20 (×12): qty 1

## 2021-02-20 MED ORDER — INSULIN ASPART 100 UNIT/ML IJ SOLN
0.0000 [IU] | Freq: Three times a day (TID) | INTRAMUSCULAR | Status: DC
  Administered 2021-02-22 – 2021-02-23 (×2): 1 [IU] via SUBCUTANEOUS
  Administered 2021-02-24: 2 [IU] via SUBCUTANEOUS
  Administered 2021-02-25: 12:00:00 1 [IU] via SUBCUTANEOUS

## 2021-02-20 MED ORDER — CINACALCET HCL 30 MG PO TABS
30.0000 mg | ORAL_TABLET | Freq: Every day | ORAL | Status: DC
  Administered 2021-02-20 – 2021-02-26 (×7): 30 mg via ORAL
  Filled 2021-02-20 (×7): qty 1

## 2021-02-20 MED ORDER — FLUTICASONE-UMECLIDIN-VILANT 100-62.5-25 MCG/ACT IN AEPB
1.0000 | INHALATION_SPRAY | Freq: Every day | RESPIRATORY_TRACT | Status: DC
Start: 2021-02-20 — End: 2021-02-20

## 2021-02-20 MED ORDER — UMECLIDINIUM BROMIDE 62.5 MCG/ACT IN AEPB
1.0000 | INHALATION_SPRAY | Freq: Every day | RESPIRATORY_TRACT | Status: DC
  Administered 2021-02-22 – 2021-02-26 (×5): 1 via RESPIRATORY_TRACT
  Filled 2021-02-20: qty 7

## 2021-02-20 MED ORDER — SODIUM CHLORIDE 0.9% FLUSH
3.0000 mL | Freq: Two times a day (BID) | INTRAVENOUS | Status: DC
  Administered 2021-02-22 – 2021-02-26 (×7): 3 mL via INTRAVENOUS

## 2021-02-20 MED ORDER — TAMSULOSIN HCL 0.4 MG PO CAPS
0.4000 mg | ORAL_CAPSULE | Freq: Every day | ORAL | Status: DC
  Administered 2021-02-20 – 2021-02-26 (×7): 0.4 mg via ORAL
  Filled 2021-02-20 (×7): qty 1

## 2021-02-20 MED ORDER — DILTIAZEM HCL ER COATED BEADS 240 MG PO CP24
240.0000 mg | ORAL_CAPSULE | Freq: Every day | ORAL | Status: DC
  Administered 2021-02-20 – 2021-02-26 (×7): 240 mg via ORAL
  Filled 2021-02-20 (×8): qty 1

## 2021-02-20 MED ORDER — HYDRALAZINE HCL 50 MG PO TABS
50.0000 mg | ORAL_TABLET | Freq: Three times a day (TID) | ORAL | Status: DC
  Administered 2021-02-20 – 2021-02-21 (×3): 50 mg via ORAL
  Filled 2021-02-20 (×4): qty 1

## 2021-02-20 MED ORDER — ISOSORBIDE DINITRATE 10 MG PO TABS
30.0000 mg | ORAL_TABLET | Freq: Three times a day (TID) | ORAL | Status: DC
  Administered 2021-02-20 – 2021-02-26 (×15): 30 mg via ORAL
  Filled 2021-02-20: qty 3
  Filled 2021-02-20: qty 1
  Filled 2021-02-20 (×3): qty 3
  Filled 2021-02-20: qty 1
  Filled 2021-02-20 (×3): qty 3
  Filled 2021-02-20: qty 1
  Filled 2021-02-20 (×4): qty 3
  Filled 2021-02-20: qty 1
  Filled 2021-02-20 (×4): qty 3

## 2021-02-20 MED ORDER — SODIUM CHLORIDE 0.9 % IV BOLUS
1000.0000 mL | Freq: Once | INTRAVENOUS | Status: AC
  Administered 2021-02-20: 1000 mL via INTRAVENOUS

## 2021-02-20 MED ORDER — FINASTERIDE 5 MG PO TABS
5.0000 mg | ORAL_TABLET | Freq: Every day | ORAL | Status: DC
  Administered 2021-02-20 – 2021-02-26 (×7): 5 mg via ORAL
  Filled 2021-02-20 (×7): qty 1

## 2021-02-20 MED ORDER — ACETAMINOPHEN 325 MG PO TABS: 650.0000 mg | ORAL_TABLET | Freq: Four times a day (QID) | ORAL | Status: DC | PRN

## 2021-02-20 MED ORDER — LABETALOL HCL 100 MG PO TABS
100.0000 mg | ORAL_TABLET | Freq: Two times a day (BID) | ORAL | Status: DC
  Administered 2021-02-21 – 2021-02-26 (×9): 100 mg via ORAL
  Filled 2021-02-20 (×10): qty 1

## 2021-02-20 MED ORDER — FLUTICASONE FUROATE-VILANTEROL 100-25 MCG/ACT IN AEPB
1.0000 | INHALATION_SPRAY | Freq: Every day | RESPIRATORY_TRACT | Status: DC
  Administered 2021-02-22 – 2021-02-26 (×5): 1 via RESPIRATORY_TRACT
  Filled 2021-02-20: qty 28

## 2021-02-20 MED ORDER — PANTOPRAZOLE SODIUM 40 MG PO TBEC
40.0000 mg | DELAYED_RELEASE_TABLET | Freq: Every day | ORAL | Status: DC
  Administered 2021-02-20 – 2021-02-22 (×3): 40 mg via ORAL
  Filled 2021-02-20 (×3): qty 1

## 2021-02-20 MED ORDER — ONDANSETRON HCL 4 MG/2ML IJ SOLN
4.0000 mg | Freq: Four times a day (QID) | INTRAMUSCULAR | Status: DC | PRN
  Filled 2021-02-20: qty 2

## 2021-02-20 MED ORDER — SODIUM CHLORIDE 0.9 % IV SOLN: INTRAVENOUS | Status: AC

## 2021-02-20 MED ORDER — POTASSIUM CHLORIDE CRYS ER 20 MEQ PO TBCR
20.0000 meq | EXTENDED_RELEASE_TABLET | Freq: Every day | ORAL | Status: DC
  Administered 2021-02-21 – 2021-02-23 (×3): 20 meq via ORAL
  Filled 2021-02-20 (×3): qty 1

## 2021-02-20 MED ORDER — ONDANSETRON HCL 4 MG PO TABS: 4.0000 mg | ORAL_TABLET | Freq: Four times a day (QID) | ORAL | Status: DC | PRN

## 2021-02-20 MED ORDER — POTASSIUM CHLORIDE CRYS ER 20 MEQ PO TBCR
40.0000 meq | EXTENDED_RELEASE_TABLET | Freq: Once | ORAL | Status: AC
  Administered 2021-02-20: 40 meq via ORAL

## 2021-02-20 MED ORDER — ACETAMINOPHEN 650 MG RE SUPP: 650.0000 mg | Freq: Four times a day (QID) | RECTAL | Status: DC | PRN

## 2021-02-20 NOTE — ED Triage Notes (Addendum)
Pt arrives from home via EMS for fall on blood thinners last night. No LOC. Pts son found him on the floor this am. Small abrasion above left eye.  Pt reports v/d 1 week ago and reports generalized weakness since then. Takes eliquis.

## 2021-02-20 NOTE — ED Provider Notes (Addendum)
[facility] EMERGENCY DEPARTMENT Provider Note   CSN: [id number] Arrival date & time: 02/20/21  1245     History Chief Complaint  Patient presents with   Fall   Weakness   Diarrhea    Roy [name] is a 85 y.o. male.  Patient presents via EMS s/p fall at home last night - symptoms acute onset, episodic. Pt indicates unsure what caused fall. Denies loc. +contusion to right forehead. Patient notes intermittent diarrhea in past week. No bloody bms. Denies abd pain or distension. No fever or chills. No vomiting. Denies chest pain or discomfort. No palpitations. No sob or unusual doe. No cough or uri symptoms. No dysuria or gu c/o.   Pt is on anticoag therapy - denies recent abnormal bruising or bleeding. Patient denies any other injury - no neck or back pain, no extremity pain or injury, no numbness/weakness.  Per patients son, subsequently present for additional history - pt generally not feeling well for past week, has been fairly non compliant w meds during that time, states at baseline his gait is unstedy/'balance issues', and uses walker - states feel sometime last night, and likely was on floor several hours until he found patient today. Pt/son not certain what caused fall but indicate feel likely 'lost balance'. Pt/son also indicate recent poor po intake. W recent diarrhea, no known ill contacts or travel. No recent antibiotic use.   The history is provided by the patient, the EMS personnel and medical records. The history is limited by the condition of the patient.  Fall Pertinent negatives include no chest pain, no abdominal pain, no headaches and no shortness of breath.      Past Medical History:  Diagnosis Date   A-fib ([place])    Arthritis HANDS/ THUMBS/ SHOULDERS   Bladder calculi    Chronic obstructive asthma, unspecified FOLLOWED BY DR Tarri Fuller [facility]  VISIT 10-14-2011 IN EPIC   COPD (chronic obstructive pulmonary disease) (HCC)    Cyst on ear    left    Diabetes mellitus    DVT (deep venous thrombosis) (HCC)    Dyspnea on exertion    GERD (gastroesophageal reflux disease)    Hematuria    History of prostate cancer 2010-- S/P  EXTERNAL RADIATION   Hydronephrosis, left    Hypertension    OSA on CPAP    wears CPAP   Sepsis ([place])    Wears glasses     Patient Active Problem List   Diagnosis Date Noted   Pulmonary hypertension, unspecified ([place]) 12/17/2019   Persistent atrial fibrillation ([place]) 12/17/2019   Leg edema 11/18/2019   Exertional dyspnea 11/18/2019   Infected cyst of skin 01/25/2016   Abscess of external ear, left 01/11/2016   Acute deep vein thrombosis (DVT) of femoral vein of left lower extremity ([place])    Bacteremia due to Enterococcus 10/09/2015   CKD (chronic kidney disease) stage 3, GFR 30-59 ml/min (HCC) 10/06/2015   AKI (acute kidney injury) ([place]) 10/06/2015   Sepsis secondary to UTI ([place]) 10/06/2015   UTI (lower urinary tract infection) 10/06/2015   Benign prostatic hyperplasia with urinary obstruction 07/24/2015   DM2 (diabetes mellitus, type 2) ([place]) 01/06/2007   EXOGENOUS OBESITY 01/06/2007   OSA on CPAP 01/06/2007   Essential hypertension 01/06/2007   Chronic obstructive airway disease with asthma ([place]) 01/06/2007    Past Surgical History:  Procedure Laterality Date   CARPAL TUNNEL RELEASE  05-28-2005   LEFT   CYSTOSCOPY N/A 09/26/2012   Procedure:  CYSTOSCOPY, clot evacuation,fulgeration of bladder, bladder biopsy, transurethreal vaporation of prostat;  Surgeon: Alexis Frock, MD;  Location: [facility];  Service: Urology;  Laterality: N/A;   CYSTOSCOPY W/ RETROGRADES Bilateral 10/14/2015   Procedure: CYSTOSCOPY WITH BILATERAL RETROGRADE PYELOGRAM/ FULGERATION WITH LITHOLAPAXY;  Surgeon: Festus Aloe, MD;  Location: [facility];  Service: Urology;  Laterality: Bilateral;   CYSTOSCOPY WITH LITHOLAPAXY  10/18/2011   Procedure: CYSTOSCOPY WITH LITHOLAPAXY;  Surgeon: Hanley Ben, MD;  Location: [place];  Service: Urology;  Laterality: N/A;  1 HOUR NO STENT H# [id number] CELL# [phone]  MEDICARE BCBS   EAR CYST EXCISION Left 02/23/2016   Procedure: EXCISION LEFT EAR CYST;  Surgeon: Izora Gala, MD;  Location: [place];  Service: ENT;  Laterality: Left;   GANGLION CYST EXCISION  1988  (APPROX)   [place]   TEE WITHOUT CARDIOVERSION N/A 10/11/2015   Procedure: TRANSESOPHAGEAL ECHOCARDIOGRAM (TEE);  Surgeon: Sanda Klein, MD;  Location: [facility] ENDOSCOPY;  Service: Cardiovascular;  Laterality: N/A;       Family History  Problem Relation Age of Onset   Diabetes Father     Social History   Tobacco Use   Smoking status: Former    Packs/day: 0.50    Years: 25.00    Pack years: 12.50    Types: Cigarettes    Quit date: 03/25/1998    Years since quitting: 22.9   Smokeless tobacco: Never  Vaping Use   Vaping Use: Never used  Substance Use Topics   Alcohol use: Not Currently   Drug use: No    Home Medications Prior to Admission medications   Medication Sig Start Date End Date Taking? Authorizing Provider  cinacalcet (SENSIPAR) 30 MG tablet Take 30 mg by mouth daily. 06/06/19   [provider]  CONTOUR NEXT TEST test strip daily. 12/10/19   [provider]  diltiazem (CARDIZEM CD) 240 MG 24 hr capsule Take 240 mg by mouth daily.    [provider]  ELIQUIS 2.5 MG TABS tablet Take 2.5 mg by mouth 2 (two) times daily. 04/14/20   [provider]  finasteride (PROSCAR) 5 MG tablet Take 5 mg by mouth daily.    [provider]  Fluticasone-Umeclidin-Vilant (TRELEGY ELLIPTA) 100-62.5-25 MCG/INH AEPB Inhale 1 puff into the lungs daily. 02/29/20   Baird Lyons D, MD  gabapentin (NEURONTIN) 100 MG capsule Take 100 mg by mouth daily.    [provider]  hydrALAZINE (APRESOLINE) 50 MG tablet TAKE 1 TABLET BY MOUTH THREE TIMES DAILY 01/02/21   Patwardhan, Manish J, MD  isosorbide dinitrate (ISORDIL) 30  MG tablet TAKE 1 TABLET BY MOUTH THREE TIMES DAILY 01/02/21   Patwardhan, Reynold Bowen, MD  labetalol (NORMODYNE) 100 MG tablet Take 1 tablet (100 mg total) by mouth 2 (two) times daily. 06/21/20   Patwardhan, Reynold Bowen, MD  methenamine (HIPREX) 1 g tablet Take 500 mg by mouth 2 (two) times daily with a meal. 04/29/18   [provider]  Microlet Lancets [place]  12/10/19   [provider]  pantoprazole (PROTONIX) 40 MG tablet Take 40 mg by mouth daily.    [provider]  potassium chloride SA (K-DUR,KLOR-CON) 20 MEQ tablet Take 20 mEq by mouth every Monday, Wednesday, and Friday.    [provider]  tamsulosin (FLOMAX) 0.4 MG CAPS capsule Take 1 capsule (0.4 mg total) by mouth daily. 10/18/15   Bonnell Public, MD  torsemide (DEMADEX) 100 MG tablet  Take 50 mg by mouth every Monday, Wednesday, and Friday. Currently taking 1 tabs Qday (10/18-11/1)    [provider]  zaleplon (SONATA) 5 MG capsule Take 1 capsule (5 mg total) by mouth at bedtime as needed for sleep. 08/29/20   Deneise Lever, MD    Allergies    Patient has no known allergies.  Review of Systems   Review of Systems  Constitutional:  Negative for fever.  HENT:  Negative for sore throat.   Eyes:  Negative for pain and visual disturbance.  Respiratory:  Negative for cough and shortness of breath.   Cardiovascular:  Negative for chest pain, palpitations and leg swelling.  Gastrointestinal:  Positive for diarrhea. Negative for abdominal pain and vomiting.  Genitourinary:  Negative for dysuria and flank pain.  Musculoskeletal:  Negative for back pain and neck pain.  Skin:  Negative for wound.  Neurological:  Negative for weakness, numbness and headaches.  Hematological:        On anticoag therapy.   Psychiatric/Behavioral:  Negative for agitation.    Physical Exam Updated Vital Signs BP (!) 155/72   Pulse 85   Temp 98.6 F (37 C) (Oral)   Resp (!) 22   Ht 1.778 m (5\' 10" )   Wt 96.6 kg    SpO2 96%   BMI 30.56 kg/m   Physical Exam Vitals and nursing note reviewed.  Constitutional:      Appearance: Normal appearance. He is well-developed.  HENT:     Head:     Comments: Contusion to right eyebrown and forehead area. Facial bones/orbits appear grossly intact.     Nose: Nose normal.     Mouth/Throat:     Mouth: Mucous membranes are moist.     Pharynx: Oropharynx is clear.  Eyes:     General: No scleral icterus.    Conjunctiva/sclera: Conjunctivae normal.     Pupils: Pupils are equal, round, and reactive to light.  Neck:     Vascular: No carotid bruit.     Trachea: No tracheal deviation.  Cardiovascular:     Rate and Rhythm: Normal rate and regular rhythm.     Pulses: Normal pulses.     Heart sounds: Normal heart sounds. No murmur heard.   No friction rub. No gallop.  Pulmonary:     Effort: Pulmonary effort is normal. No accessory muscle usage or respiratory distress.     Breath sounds: Normal breath sounds.  Chest:     Chest wall: No tenderness.  Abdominal:     General: Bowel sounds are normal. There is no distension.     Palpations: Abdomen is soft. There is no mass.     Tenderness: There is no abdominal tenderness. There is no guarding.  Genitourinary:    Comments: No cva tenderness. Musculoskeletal:        General: No swelling.     Cervical back: Normal range of motion and neck supple. No rigidity or tenderness.     Comments: CTLS spine, non tender, aligned, no step off. Good rom bilateral extremities without pain or focal bony tenderness.   Skin:    General: Skin is warm and dry.     Findings: No rash.  Neurological:     Mental Status: He is alert.     Comments: Alert, speech clear. GCS 15. Motor/sens grossly intact bil.   Psychiatric:        Mood and Affect: Mood normal.    ED Results / Procedures / Treatments  Labs (all labs ordered are listed, but only abnormal results are displayed) Results for orders placed or performed during the  hospital encounter of 02/20/21  CBC  Result Value Ref Range   WBC 15.1 (H) 4.0 - 10.5 K/uL   RBC 4.26 4.22 - 5.81 MIL/uL   Hemoglobin 12.7 (L) 13.0 - 17.0 g/dL   HCT 39.5 39.0 - 52.0 %   MCV 92.7 80.0 - 100.0 fL   MCH 29.8 26.0 - 34.0 pg   MCHC 32.2 30.0 - 36.0 g/dL   RDW 15.1 11.5 - 15.5 %   Platelets 188 150 - 400 K/uL   nRBC 0.0 0.0 - 0.2 %  Comprehensive metabolic panel  Result Value Ref Range   Sodium 139 135 - 145 mmol/L   Potassium 3.0 (L) 3.5 - 5.1 mmol/L   Chloride 103 98 - 111 mmol/L   CO2 26 22 - 32 mmol/L   Glucose, Bld 134 (H) 70 - 99 mg/dL   BUN 66 (H) 8 - 23 mg/dL   Creatinine, Ser 3.08 (H) 0.61 - 1.24 mg/dL   Calcium 9.5 8.9 - 10.3 mg/dL   Total Protein 5.1 (L) 6.5 - 8.1 g/dL   Albumin 2.6 (L) 3.5 - 5.0 g/dL   AST 59 (H) 15 - 41 U/L   ALT 54 (H) 0 - 44 U/L   Alkaline Phosphatase 87 38 - 126 U/L   Total Bilirubin 1.4 (H) 0.3 - 1.2 mg/dL   GFR, Estimated 19 (L) >60 mL/min   Anion gap 10 5 - 15  POC CBG, ED  Result Value Ref Range   Glucose-Capillary 149 (H) 70 - 99 mg/dL   DG Facial Bones Complete  Result Date: 02/12/2021 CLINICAL DATA:  Fall several days ago with right eye bruising, initial encounter EXAM: FACIAL BONES COMPLETE 3+V COMPARISON:  None. FINDINGS: There is no evidence of fracture or other significant bone abnormality. No orbital emphysema or sinus air-fluid levels are seen. IMPRESSION: No acute abnormality noted. Electronically Signed   By: Inez Catalina M.D.   On: 02/12/2021 20:54   CT HEAD WO CONTRAST (5MM)  Result Date: 02/20/2021 CLINICAL DATA:  [place] yesterday at home. Right frontal scalp hematoma. EXAM: CT HEAD WITHOUT CONTRAST TECHNIQUE: Contiguous axial images were obtained from the base of the skull through the vertex without intravenous contrast. COMPARISON:  10/06/2015 FINDINGS: Brain: Stable age related cerebral atrophy, ventriculomegaly and periventricular white matter disease. No extra-axial fluid collections are identified. No CT  findings for acute hemispheric infarction or intracranial hemorrhage. No mass lesions. The brainstem and cerebellum are normal. Vascular: Stable vascular calcifications. No aneurysm or hyperdense vessels. Skull: No skull fracture or bone lesions. Sinuses/Orbits: Scattered ethmoid sinus disease. The mastoid air cells and middle ear cavities are clear. The globes are intact. Other: Right supraorbital and right frontal scalp hematoma without underlying skull fracture or radiopaque foreign body. IMPRESSION: 1. Right supraorbital and right frontal scalp hematoma without underlying skull fracture or radiopaque foreign body. 2. Stable age related cerebral atrophy, ventriculomegaly and periventricular white matter disease. No acute intracranial findings. Electronically Signed   By: Marijo Sanes M.D.   On: 02/20/2021 15:11   CT Cervical Spine Wo Contrast  Result Date: 02/20/2021 CLINICAL DATA:  Trauma.  Status post fall. EXAM: CT CERVICAL SPINE WITHOUT CONTRAST TECHNIQUE: Multidetector CT imaging of the cervical spine was performed without intravenous contrast. Multiplanar CT image reconstructions were also generated. COMPARISON:  06/02/2014 FINDINGS: Alignment: The alignment of the cervical spine is normal. Skull base and  vertebrae: The vertebral body heights are well preserved no acute fracture or dislocation identified. Soft tissues and spinal canal: No prevertebral fluid or swelling. No visible canal hematoma. Disc levels: Multilevel disc space narrowing and endplate spurring is noted throughout the cervical spine. This is most severe at C4-5 through C6-7. Upper chest: Negative. Other: None IMPRESSION: 1. No evidence for cervical spine fracture. 2. Cervical degenerative disc disease. Electronically Signed   By: Kerby Moors M.D.   On: 02/20/2021 15:14    EKG EKG Interpretation  Date/Time:  Tuesday February 20 2021 12:59:27 EST Ventricular Rate:  90 PR Interval:    QRS Duration: 96 QT Interval:  355 QTC  Calculation: 427 R Axis:   -39 Text Interpretation: Atrial fibrillation Premature ventricular complexes Non-specific ST-t changes Baseline wander Confirmed by Lajean Saver [phone]) on 02/20/2021 1:04:59 PM  Radiology CT HEAD WO CONTRAST (5MM)  Result Date: 02/20/2021 CLINICAL DATA:  [place] yesterday at home. Right frontal scalp hematoma. EXAM: CT HEAD WITHOUT CONTRAST TECHNIQUE: Contiguous axial images were obtained from the base of the skull through the vertex without intravenous contrast. COMPARISON:  10/06/2015 FINDINGS: Brain: Stable age related cerebral atrophy, ventriculomegaly and periventricular white matter disease. No extra-axial fluid collections are identified. No CT findings for acute hemispheric infarction or intracranial hemorrhage. No mass lesions. The brainstem and cerebellum are normal. Vascular: Stable vascular calcifications. No aneurysm or hyperdense vessels. Skull: No skull fracture or bone lesions. Sinuses/Orbits: Scattered ethmoid sinus disease. The mastoid air cells and middle ear cavities are clear. The globes are intact. Other: Right supraorbital and right frontal scalp hematoma without underlying skull fracture or radiopaque foreign body. IMPRESSION: 1. Right supraorbital and right frontal scalp hematoma without underlying skull fracture or radiopaque foreign body. 2. Stable age related cerebral atrophy, ventriculomegaly and periventricular white matter disease. No acute intracranial findings. Electronically Signed   By: Marijo Sanes M.D.   On: 02/20/2021 15:11   CT Cervical Spine Wo Contrast  Result Date: 02/20/2021 CLINICAL DATA:  Trauma.  Status post fall. EXAM: CT CERVICAL SPINE WITHOUT CONTRAST TECHNIQUE: Multidetector CT imaging of the cervical spine was performed without intravenous contrast. Multiplanar CT image reconstructions were also generated. COMPARISON:  06/02/2014 FINDINGS: Alignment: The alignment of the cervical spine is normal. Skull base and vertebrae: The  vertebral body heights are well preserved no acute fracture or dislocation identified. Soft tissues and spinal canal: No prevertebral fluid or swelling. No visible canal hematoma. Disc levels: Multilevel disc space narrowing and endplate spurring is noted throughout the cervical spine. This is most severe at C4-5 through C6-7. Upper chest: Negative. Other: None IMPRESSION: 1. No evidence for cervical spine fracture. 2. Cervical degenerative disc disease. Electronically Signed   By: Kerby Moors M.D.   On: 02/20/2021 15:14    Procedures Procedures   Medications Ordered in ED Medications  potassium chloride SA (KLOR-CON M) CR tablet 40 mEq (has no administration in time range)  sodium chloride 0.9 % bolus 1,000 mL (1,000 mLs Intravenous New Bag/Given 02/20/21 1441)    ED Course  I have reviewed the triage vital signs and the nursing notes.  Pertinent labs & imaging results that were available during my care of the patient were reviewed by me and considered in my medical decision making (see chart for details).    MDM Rules/Calculators/A&P                          Labs sent, imaging ordered.   Reviewed  nursing notes and prior charts for additional history.   Labs reviewed/interpreted by me - cr 3 - c/w aki as compared to prior. With additional hx, ?prolonged time on ground, will give ivf, and check ck.   CT reviewed/interpreted by me -  no hem.   Given fall, unclear cause, ?syncope vs mechanical fall, prolonged period on ground with aki on labs (ck pending), ?dehydration with recent poor po intake/high bun/aki - will admit to medicine/obs.   Hospitalist consulted for admission.      Final Clinical Impression(s) / ED Diagnoses Final diagnoses:  Contusion of forehead, initial encounter  Fall, initial encounter  Generalized weakness  Diarrhea, unspecified type  Dehydration  AKI (acute kidney injury) [facility])  Essential hypertension    Rx / DC Orders ED Discharge Orders      None          Lajean Saver, MD 02/20/21 1524

## 2021-02-20 NOTE — Discharge Instructions (Signed)
It was our pleasure to provide your ER care today - we hope that you feel better.  Fall precautions - use great care to avoid falling.   Follow up with primary care doctor in the next 1-2 weeks.   Return to ER if worse, new symptoms, fevers, new/severe pain, chest pain, severe abdominal pain, trouble breathing, or other emergency concern.

## 2021-02-20 NOTE — H&P (Addendum)
History and Physical    Roy [name] WUJ:[id number] DOB: 10-05-1935 DOA: 02/20/2021  PCP: Rogers Blocker, MD Consultants:  pulm: Dr. Annamaria Boots, urology: Dr. Tresa Moore, cardiology: Dr. Virgina Jock, nephrology: Dr. Justin Mend   Patient coming from:  Home - lives alone, son lives close by.   Chief Complaint: fall and weakness   HPI: Roy [name] is a 85 y.o. male with medical history significant of chronic atrial fibrillation on eliquis, HTN, T2DM, OSA on CPAP, chronic obstructive airway disease with asthma, BPH, anemia of chronic kidney disease with weekly injections of retacrit who presented to ED with fall sometime last night. He was found by his son today. Unsure how he ended up on ground, syncope vs. Mechanical fall. History provided by his son.   [name] hasn't been feeling well x 1 week. He has had some Gi issues with vomiting, diarrhea that started yesterday, weakness and poor PO intake.  Denies any blood in his stool/vomit.  His son went to his house yesterday because he had fallen. He was down for a couple of hours.  He also apparently fell last night and was down for an unknown amount of time. He had no loss of consciousness, he just didn't have the strength to get up. Then during the night sometime last night he had another fall.  He didn't have his life alert on him. His son found him on the ground around 10:00AM and he was on the ground. He tells me he tripped and fell around 11:30pm. He denies any loss of consciousness. When his son found him this AM, he called 911 and was brought to the hospital.   No sick contacts. Denies any fever/chills, no chest pain/palpitations, no lightheadedness or dizziness, no shortness of breath, has a mild cough. He denies any dysuria, but states he hasn't urinated in some time. He thinks he urinated yesterday. Leg swelling at baseline. Denies any confusion, facial droop, slurred speech, unilateral weakness.    He does have a baseline of balance issues and  gait instability for a few years.   ED Course: vitals: afebrile, bp: 169/89, HR: 69, RR: 17, oxygen: 97% room air Pertinent labs: WBC: 15.1, hgb: 12.7, potassium: 3.0, BUN: 66, creatinine: 3.08, AST: 59, ALT: 54, total bilirubin: 1.4 CT head: right supraorbital and right fontal scalp  hematoma without underlying skull fracture or foreign body.  CT cervical spine: no acute fracture In ED: given 1L bolus and 31meq oral potassium. [facility] was asked to admit.   Review of Systems: As per HPI; otherwise review of systems reviewed and negative.   Ambulatory Status:  Ambulates with cane    Past Medical History:  Diagnosis Date   A-fib ([place])    Arthritis HANDS/ THUMBS/ SHOULDERS   Bladder calculi    Chronic obstructive asthma, unspecified FOLLOWED BY DR Tarri Fuller YOUNG  VISIT 10-14-2011 IN [facility]   COPD (chronic obstructive pulmonary disease) (HCC)    Cyst on ear    left   Diabetes mellitus    DVT (deep venous thrombosis) (HCC)    Dyspnea on exertion    GERD (gastroesophageal reflux disease)    Hematuria    History of prostate cancer 2010-- S/P  EXTERNAL RADIATION   Hydronephrosis, left    Hypertension    OSA on CPAP    wears CPAP   Sepsis ([place])    Wears glasses     Past Surgical History:  Procedure Laterality Date   CARPAL TUNNEL RELEASE  05-28-2005   LEFT  CYSTOSCOPY N/A 09/26/2012   Procedure: CYSTOSCOPY, clot evacuation,fulgeration of bladder, bladder biopsy, transurethreal vaporation of prostat;  Surgeon: Alexis Frock, MD;  Location: [facility];  Service: Urology;  Laterality: N/A;   CYSTOSCOPY W/ RETROGRADES Bilateral 10/14/2015   Procedure: CYSTOSCOPY WITH BILATERAL RETROGRADE PYELOGRAM/ FULGERATION WITH LITHOLAPAXY;  Surgeon: Festus Aloe, MD;  Location: [facility];  Service: Urology;  Laterality: Bilateral;   CYSTOSCOPY WITH LITHOLAPAXY  10/18/2011   Procedure: CYSTOSCOPY WITH LITHOLAPAXY;  Surgeon: Hanley Ben, MD;  Location: [place];  Service: Urology;   Laterality: N/A;  1 HOUR NO STENT H# [id number] CELL# [id number]  MEDICARE BCBS   EAR CYST EXCISION Left 02/23/2016   Procedure: EXCISION LEFT EAR CYST;  Surgeon: Izora Gala, MD;  Location: [place];  Service: ENT;  Laterality: Left;   GANGLION CYST EXCISION  1988  (APPROX)   [place]   TEE WITHOUT CARDIOVERSION N/A 10/11/2015   Procedure: TRANSESOPHAGEAL ECHOCARDIOGRAM (TEE);  Surgeon: Sanda Klein, MD;  Location: [facility];  Service: Cardiovascular;  Laterality: N/A;    Social History   Socioeconomic History   Marital status: Widowed    Spouse name: Not on file   Number of children: 3   Years of education: Not on file   Highest education level: Not on file  Occupational History   Occupation: Retired    Comment: formerly Production assistant, radio  Tobacco Use   Smoking status: Former    Packs/day: 0.50    Years: 25.00    Pack years: 12.50    Types: Cigarettes    Quit date: 03/25/1998    Years since quitting: 22.9   Smokeless tobacco: Never  Vaping Use   Vaping Use: Never used  Substance and Sexual Activity   Alcohol use: Not Currently   Drug use: No   Sexual activity: Not on file  Other Topics Concern   Not on file  Social History Narrative   Not on file   Social Determinants of Health   Financial Resource Strain: Not on file  Food Insecurity: Not on file  Transportation Needs: Not on file  Physical Activity: Not on file  Stress: Not on file  Social Connections: Not on file  Intimate Partner Violence: Not on file    No Known Allergies  Family History  Problem Relation Age of Onset   Diabetes Father     Prior to Admission medications   Medication Sig Start Date End Date Taking? Authorizing Provider  cinacalcet (SENSIPAR) 30 MG tablet Take 30 mg by mouth daily. 06/06/19   [provider]  CONTOUR NEXT TEST test strip daily. 12/10/19   [provider]  diltiazem (CARDIZEM CD) 240 MG 24 hr capsule Take 240 mg by mouth daily.     [provider]  ELIQUIS 2.5 MG TABS tablet Take 2.5 mg by mouth 2 (two) times daily. 04/14/20   [provider]  finasteride (PROSCAR) 5 MG tablet Take 5 mg by mouth daily.    [provider]  Fluticasone-Umeclidin-Vilant (TRELEGY ELLIPTA) 100-62.5-25 MCG/INH AEPB Inhale 1 puff into the lungs daily. 02/29/20   Baird Lyons D, MD  gabapentin (NEURONTIN) 100 MG capsule Take 100 mg by mouth daily.    [provider]  hydrALAZINE (APRESOLINE) 50 MG tablet TAKE 1 TABLET BY MOUTH THREE TIMES DAILY 01/02/21   Patwardhan, Manish J, MD  isosorbide dinitrate (ISORDIL) 30 MG tablet TAKE 1 TABLET BY MOUTH THREE TIMES DAILY 01/02/21   Patwardhan, Manish J,  MD  labetalol (NORMODYNE) 100 MG tablet Take 1 tablet (100 mg total) by mouth 2 (two) times daily. 06/21/20   Patwardhan, Reynold Bowen, MD  methenamine (HIPREX) 1 g tablet Take 500 mg by mouth 2 (two) times daily with a meal. 04/29/18   [provider]  Microlet Lancets [place]  12/10/19   [provider]  pantoprazole (PROTONIX) 40 MG tablet Take 40 mg by mouth daily.    [provider]  potassium chloride SA (K-DUR,KLOR-CON) 20 MEQ tablet Take 20 mEq by mouth every Monday, Wednesday, and Friday.    [provider]  tamsulosin (FLOMAX) 0.4 MG CAPS capsule Take 1 capsule (0.4 mg total) by mouth daily. 10/18/15   Dana Allan I, MD  torsemide (DEMADEX) 100 MG tablet Take 50 mg by mouth every Monday, Wednesday, and Friday. Currently taking 1 tabs Qday (10/18-11/1)    [provider]  zaleplon (SONATA) 5 MG capsule Take 1 capsule (5 mg total) by mouth at bedtime as needed for sleep. 08/29/20   Baird Lyons D, MD    Physical Exam: Vitals:   02/20/21 1257 02/20/21 1330 02/20/21 1345 02/20/21 1425  BP:  (!) 169/89 (!) 156/84 (!) 155/72  Pulse:  69 (!) 37 85  Resp:  17 14 (!) 22  Temp:   98.6 F (37 C)   TempSrc:   Oral   SpO2:  97% 96% 96%  Weight: 96.6 kg     Height: 5\' 10"   (1.778 m)        General:  Appears calm and comfortable and is in NAD Eyes:  PERRL, EOMI, normal lids, iris. Has ecchymosis around right eye (had fall last week) and abrasion on right forehead  ENT:  grossly normal hearing, lips & tongue, mmm; no teeth  Neck:  no LAD, masses or thyromegaly; no carotid bruits Cardiovascular:  irregularly irregular, no m/r/g. 2-3+ pitting edema to below the ankle.  Respiratory:   CTA bilaterally with no wheezes/rales/rhonchi.  Normal respiratory effort. Abdomen:  soft, TTP in RLQ. No rebound or guarding. BS+ Back:   normal alignment, no CVAT Skin:  no rash or induration seen on limited exam, bruising around right eye. Abrasion on right forehead.  Musculoskeletal:  bilateral LE: strength 2-3/5. BUE: 5/5 good ROM, no bony abnormality Lower extremity:   Limited foot exam with no ulcerations.  2+ distal pulses. Psychiatric:  grossly normal mood and affect, speech fluent and appropriate, AOx3 Neurologic:  CN 2-12 grossly intact, moves all extremities in coordinated fashion, sensation intact    Radiological Exams on Admission: Independently reviewed - see discussion in A/P where applicable  CT HEAD WO CONTRAST (5MM)  Result Date: 02/20/2021 CLINICAL DATA:  [place] yesterday at home. Right frontal scalp hematoma. EXAM: CT HEAD WITHOUT CONTRAST TECHNIQUE: Contiguous axial images were obtained from the base of the skull through the vertex without intravenous contrast. COMPARISON:  10/06/2015 FINDINGS: Brain: Stable age related cerebral atrophy, ventriculomegaly and periventricular white matter disease. No extra-axial fluid collections are identified. No CT findings for acute hemispheric infarction or intracranial hemorrhage. No mass lesions. The brainstem and cerebellum are normal. Vascular: Stable vascular calcifications. No aneurysm or hyperdense vessels. Skull: No skull fracture or bone lesions. Sinuses/Orbits: Scattered ethmoid sinus disease. The mastoid air cells  and middle ear cavities are clear. The globes are intact. Other: Right supraorbital and right frontal scalp hematoma without underlying skull fracture or radiopaque foreign body. IMPRESSION: 1. Right supraorbital and right frontal scalp hematoma without underlying skull fracture or radiopaque  foreign body. 2. Stable age related cerebral atrophy, ventriculomegaly and periventricular white matter disease. No acute intracranial findings. Electronically Signed   By: Marijo Sanes M.D.   On: 02/20/2021 15:11   CT Cervical Spine Wo Contrast  Result Date: 02/20/2021 CLINICAL DATA:  Trauma.  Status post fall. EXAM: CT CERVICAL SPINE WITHOUT CONTRAST TECHNIQUE: Multidetector CT imaging of the cervical spine was performed without intravenous contrast. Multiplanar CT image reconstructions were also generated. COMPARISON:  06/02/2014 FINDINGS: Alignment: The alignment of the cervical spine is normal. Skull base and vertebrae: The vertebral body heights are well preserved no acute fracture or dislocation identified. Soft tissues and spinal canal: No prevertebral fluid or swelling. No visible canal hematoma. Disc levels: Multilevel disc space narrowing and endplate spurring is noted throughout the cervical spine. This is most severe at C4-5 through C6-7. Upper chest: Negative. Other: None IMPRESSION: 1. No evidence for cervical spine fracture. 2. Cervical degenerative disc disease. Electronically Signed   By: Kerby Moors M.D.   On: 02/20/2021 15:14    EKG: Independently reviewed.  Atrial fibrillation with rate 90; nonspecific ST changes with no evidence of acute ischemia   Labs on Admission: I have personally reviewed the available labs and imaging studies at the time of the admission.  Pertinent labs:  WBC: 15.1,  hgb: 12.7,  potassium: 3.0,  BUN: 66,  creatinine: 3.08,  AST: 59,  ALT: 54,  total bilirubin: 1.4  Assessment/Plan Principal Problem:   Syncope and collapse 85 year old male with history of  atrial fibrillation and known gait instability who had 3 falls with unknown syncope found on the ground, awake and alert by his son.  -place in obs on telemetry -repeat echo pending -orthostatic vitals -has known gait instability, PT to eval -also dehydrated with current GI illness, orthostasis may have played a role in this as well.   Active Problems:   Acute renal failure superimposed on stage 3 chronic kidney disease (HCC) Appears to be acute on chronic Creatinine from a year ago: (1.86) Likely pre renal in setting of diarrhea/vomiting. CK mildly bump.  -received 1L bolus in ED, has grade III diastolic dysfunction. Will give time limited gentle IVF 50cc/hour x 5 hours.  -UA pending  -intake/output -avoid nephrotoxic drugs.-demadex   RLQ tenderness/vomiting/diarrhea -appears to have viral gastroenteritis at this point.  -vomiting appears to have resolved-zofran prn  -has not had diarrhea today, but ll check stool PCR -has mild TTP in RLQ, non surgical exam. Unsure if he hurt this during fall.  Still has appendix. If fever,worsens pain, would image.     Hypokalemia Secondary to extra renal losses with diarrhea/vomiting  Magnesium pending Repleted orally x 40 meq Repeat in AM   Elevated LFTs-mild Suspect elevated in setting of viral illness Will check hepatitis panel Trend   Leukocytosis -likely reactive, UA pending, only exam finding RLQ TTP.  -received 1L bolus in ED -continue time limited IVF -trend and follow fever curve -UA pending   Gait instability and falls Pt to eval  Persistent atrial fibrillation (HCC) -rate controlled -continue eliquis and cardizem -telemetry x 24 hours   Chronic diastolic CHF Does not appear volume overloaded on exam, LE swelling at baseline  -received 1L IVF in ED -will do very gentle, time limited IVF in setting of poor PO intake x 1 week and nausea and vomiting with AKI: 50cc/hour x 5 hours.  -strict intake and output -daily  weights -holding lasix with AKI -repeat echo pending -last echo 5/22:  EF 60-65%. Grade III diastolic dysfunction. Mild pulmonary HTN. Moderate pericardial effusion.     Essential hypertension Continue home medication-on cardizem, hydralazine and labetolol.     DM2 (diabetes mellitus, type 2) ([place]) Appears diet controlled, last a1c in 2017 Repeat pending SSI and accuchecks per protocol     Chronic obstructive airway disease with asthma ([place]) No signs of exacerbation, stable.  PFT 12/21: mild restriction, mild obstruction w/o resp toBD, DLCO mildly reduced.  Continue trelegy     OSA on cpap  Continue cpap On zaleplon prn to help sleep onset     Benign prostatic hyperplasia with urinary obstruction  Continue flomax and proscar     Body mass index is 30.56 kg/m.    Level of care: Telemetry Medical DVT prophylaxis:  eliquis  Code Status:  Full - confirmed with patient/family Family Communication: son at [place] Disposition Plan:  The patient is from: home  Anticipated d/c is to: per day team  Patient placed in observation as anticipate less than 2 midnight stay. Requires hospitalization for possible syncope w/u/fall, acute on chronic kidney disease in setting of GI viral illness with vomiting and diarrhea, IVF and close monitoring. Not safe to go home at this time and needs eval by PT.   Patient is currently: stable Consults called: none  Admission status:  observation    Orma Flaming MD [facility]   How to contact the [facility] Attending or Consulting provider [place] or covering provider during after hours [place], for this patient?  Check the care team in [facility] and look for a) attending/consulting [facility] provider listed and b) the [facility] team listed Log into [email or web address] and use 's universal password to access. If you do not have the password, please contact the hospital operator. Locate the [facility] provider you are looking for under [facility] and page to a number that you can be directly reached. If you still have difficulty reaching the provider, please page the [facility] (Director on Call) for the Hospitalists listed on amion for assistance.   02/20/2021, 4:04 PM

## 2021-02-21 ENCOUNTER — Observation Stay (HOSPITAL_BASED_OUTPATIENT_CLINIC_OR_DEPARTMENT_OTHER): Payer: Medicare Other

## 2021-02-21 DIAGNOSIS — R55 Syncope and collapse: Secondary | ICD-10-CM | POA: Diagnosis not present

## 2021-02-21 LAB — COMPREHENSIVE METABOLIC PANEL
ALT: 42 U/L (ref 0–44)
AST: 37 U/L (ref 15–41)
Albumin: 2.2 g/dL — ABNORMAL LOW (ref 3.5–5.0)
Alkaline Phosphatase: 70 U/L (ref 38–126)
Anion gap: 8 (ref 5–15)
BUN: 61 mg/dL — ABNORMAL HIGH (ref 8–23)
CO2: 24 mmol/L (ref 22–32)
Calcium: 9 mg/dL (ref 8.9–10.3)
Chloride: 106 mmol/L (ref 98–111)
Creatinine, Ser: 2.88 mg/dL — ABNORMAL HIGH (ref 0.61–1.24)
GFR, Estimated: 21 mL/min — ABNORMAL LOW (ref >60)
Glucose, Bld: 100 mg/dL — ABNORMAL HIGH (ref 70–99)
Potassium: 2.8 mmol/L — ABNORMAL LOW (ref 3.5–5.1)
Sodium: 138 mmol/L (ref 135–145)
Total Bilirubin: 1.4 mg/dL — ABNORMAL HIGH (ref 0.3–1.2)
Total Protein: 4.6 g/dL — ABNORMAL LOW (ref 6.5–8.1)

## 2021-02-21 LAB — ECHOCARDIOGRAM COMPLETE
AR max vel: 1.86 cm2
AV Area VTI: 1.71 cm2
AV Area mean vel: 1.68 cm2
AV Mean grad: 5 mmHg
AV Peak grad: 8.8 mmHg
Ao pk vel: 1.48 m/s
Area-P 1/2: 4.33 cm2
Height: 70 in
MV VTI: 2.44 cm2
S' Lateral: 2.9 cm
Weight: 3408 oz

## 2021-02-21 LAB — CBC
HCT: 36 % — ABNORMAL LOW (ref 39.0–52.0)
Hemoglobin: 11.9 g/dL — ABNORMAL LOW (ref 13.0–17.0)
MCH: 30.6 pg (ref 26.0–34.0)
MCHC: 33.1 g/dL (ref 30.0–36.0)
MCV: 92.5 fL (ref 80.0–100.0)
Platelets: 161 10*3/uL (ref 150–400)
RBC: 3.89 MIL/uL — ABNORMAL LOW (ref 4.22–5.81)
RDW: 15.3 % (ref 11.5–15.5)
WBC: 11 10*3/uL — ABNORMAL HIGH (ref 4.0–10.5)
nRBC: 0.3 % — ABNORMAL HIGH (ref 0.0–0.2)

## 2021-02-21 LAB — URINALYSIS, ROUTINE W REFLEX MICROSCOPIC
Bilirubin Urine: NEGATIVE
Glucose, UA: NEGATIVE mg/dL
Ketones, ur: NEGATIVE mg/dL
Nitrite: NEGATIVE
Protein, ur: >300 mg/dL — AB
Specific Gravity, Urine: 1.02 (ref 1.005–1.030)
pH: 6 (ref 5.0–8.0)

## 2021-02-21 LAB — URINALYSIS, MICROSCOPIC (REFLEX)

## 2021-02-21 LAB — CBG MONITORING, ED
Glucose-Capillary: 142 mg/dL — ABNORMAL HIGH (ref 70–99)
Glucose-Capillary: 88 mg/dL (ref 70–99)
Glucose-Capillary: 89 mg/dL (ref 70–99)

## 2021-02-21 LAB — HEMOGLOBIN A1C
Hgb A1c MFr Bld: 5.5 % (ref 4.8–5.6)
Mean Plasma Glucose: 111 mg/dL

## 2021-02-21 LAB — VITAMIN B12: Vitamin B-12: 406 pg/mL (ref 180–914)

## 2021-02-21 LAB — GLUCOSE, CAPILLARY
Glucose-Capillary: 120 mg/dL — ABNORMAL HIGH (ref 70–99)
Glucose-Capillary: 171 mg/dL — ABNORMAL HIGH (ref 70–99)

## 2021-02-21 MED ORDER — MAGNESIUM SULFATE 2 GM/50ML IV SOLN
2.0000 g | Freq: Once | INTRAVENOUS | Status: AC
  Administered 2021-02-21: 2 g via INTRAVENOUS
  Filled 2021-02-21: qty 50

## 2021-02-21 MED ORDER — LACTATED RINGERS IV BOLUS
500.0000 mL | Freq: Once | INTRAVENOUS | Status: AC
  Administered 2021-02-21: 500 mL via INTRAVENOUS

## 2021-02-21 MED ORDER — POTASSIUM CHLORIDE 10 MEQ/100ML IV SOLN
10.0000 meq | Freq: Once | INTRAVENOUS | Status: AC
  Administered 2021-02-21: 10 meq via INTRAVENOUS
  Filled 2021-02-21: qty 100

## 2021-02-21 MED ORDER — SODIUM CHLORIDE 0.9 % IV SOLN: INTRAVENOUS | Status: AC

## 2021-02-21 MED ORDER — POTASSIUM CHLORIDE 10 MEQ/100ML IV SOLN
10.0000 meq | INTRAVENOUS | Status: AC
  Administered 2021-02-21: 10 meq via INTRAVENOUS
  Filled 2021-02-21: qty 100

## 2021-02-21 NOTE — ED Notes (Signed)
Pt provided graham crackers, apple juice and apple sauce to support glucose levels.

## 2021-02-21 NOTE — Progress Notes (Signed)
PROGRESS NOTE    Roy [name]  ZMO:[id number] DOB: 1935/04/30 DOA: 02/20/2021 PCP: Rogers Blocker, MD   Brief Narrative: 85 year old with past medical history significant for chronic A. fib on Eliquis, hypertension, diabetes type 2, OSA on CPAP, chronic obstructive airway disease with asthma, BPH, anemia of chronic kidney disease with weekly injection who presents to the ED after a fall sometimes the night prior to admission.  He was found by his son the day of admission.  Unknown for how long patient was down, unclear if syncope versus mechanical fall. Patient had a gastrointestinal illness with diarrhea nausea vomiting that is started the day prior to admission.  Poor oral intake. Patient was found to have leukocytosis white blood cell 15, hemoglobin 12, potassium 3.0, creatinine 3.0, mild transaminases, CT head right supraorbital and right frontal scalp hematoma without underlying skull fracture or foreign body.  CT cervical spine no acute fracture.  Patient admitted for recurrent fall questionable syncope, AKI.  Assessment & Plan:   Principal Problem:   Syncope and collapse Active Problems:   DM2 (diabetes mellitus, type 2) (HCC)   OSA on CPAP   Essential hypertension   Chronic obstructive airway disease with asthma (HCC)   Persistent atrial fibrillation (HCC)   Benign prostatic hyperplasia with urinary obstruction   Hypokalemia   Elevated LFTs   Leukocytosis   Acute renal failure superimposed on stage 3 chronic kidney disease (HCC)   Chronic diastolic CHF (congestive heart failure) ([place])   1-Recurrent Fall, questionable Syncope: -Orthostatic vitals pending -Continue with IV fluid -Echo; normal ejection fraction, moderate pericardial effusion resolved -PT OT consulted -Suspect related to dehydration,  related to recent GI illness  2-AKI superimposed on CKD stage IIIB Creatinine 1 year ago 1.8 Suspect related to hypovolemia. Received  IV fluids. Strict I's and  O's Creatinine peaked to 3.  Trending down today 2.8  3-Hypokalemia: Replete orally and IV  4-Transaminases: Resolved Hepatitis panel negative  5-Diarrhea right lower quadrant pain Improved.  6 leukocytosis: Likely reactive UA:  Gait instability, frequent fall: Check B12.  PT  Persistent A. fib: Rate controlled, continue with Eliquis and Cardizem  Chronic diastolic heart failure: Compensated.  Holding diuretics Hypertension: Continue with Cardizem hydralazine and labetalol Diabetes type 2: Continue with sliding scale insulin COPD: Stable OSA on CPAP continue with CPAP     Estimated body mass index is 30.56 kg/m as calculated from the following:   Height as of this encounter: 5\' 10"  (1.778 m).   Weight as of this encounter: 96.6 kg.   DVT prophylaxis: Eliquis Code Status: Full code Family Communication: Disposition Plan:  Status is: Observation  The patient remains OBS appropriate and will d/c before 2 midnights.       Consultants:  none  Procedures:  ECHO  Antimicrobials:    Subjective: He is alert and conversant, he has been falling.  He denies pain  Objective: Vitals:   02/21/21 0615 02/21/21 0630 02/21/21 0700 02/21/21 0726  BP:  (!) 166/84 (!) 162/86   Pulse: 89 89 74   Resp: 14 14 (!) 22   Temp:    98.2 F (36.8 C)  TempSrc:    Oral  SpO2: 99% 99% 95%   Weight:      Height:        Intake/Output Summary (Last 24 hours) at 02/21/2021 0729 Last data filed at 02/21/2021 0255 Gross per 24 hour  Intake 1081.59 ml  Output --  Net 1081.59 ml   [employer]  02/20/21 1257  Weight: 96.6 kg    Examination:  General exam: Appears calm and comfortable  Respiratory system: Clear to auscultation. Respiratory effort normal. Cardiovascular system: S1 & S2 heard, RRR. No JVD, murmurs, rubs, gallops or clicks. No pedal edema. Gastrointestinal system: Abdomen is nondistended, soft and nontender. No organomegaly or masses felt. Normal bowel  sounds heard. Central nervous system: Alert and oriented.  Follows commands Extremities: Symmetric 5 x 5 power.   Data Reviewed: I have personally reviewed following labs and imaging studies  CBC: Recent Labs  Lab 02/20/21 1312 02/21/21 0326  WBC 15.1* 11.0*  HGB 12.7* 11.9*  HCT 39.5 36.0*  MCV 92.7 92.5  PLT 188 578   Basic Metabolic Panel: Recent Labs  Lab 02/20/21 1312 02/21/21 0326  NA 139 138  K 3.0* 2.8*  CL 103 106  CO2 26 24  GLUCOSE 134* 100*  BUN 66* 61*  CREATININE 3.08* 2.88*  CALCIUM 9.5 9.0  MG 1.7  --    GFR: Estimated Creatinine Clearance: 22.7 mL/min (A) (by C-G formula based on SCr of 2.88 mg/dL (H)). Liver Function Tests: Recent Labs  Lab 02/20/21 1312 02/21/21 0326  AST 59* 37  ALT 54* 42  ALKPHOS 87 70  BILITOT 1.4* 1.4*  PROT 5.1* 4.6*  ALBUMIN 2.6* 2.2*   No results for input(s): LIPASE, AMYLASE in the last 168 hours. No results for input(s): AMMONIA in the last 168 hours. Coagulation Profile: No results for input(s): INR, PROTIME in the last 168 hours. Cardiac Enzymes: Recent Labs  Lab 02/20/21 1312  CKTOTAL 496*   BNP (last 3 results) No results for input(s): PROBNP in the last 8760 hours. HbA1C: Recent Labs    02/20/21 1630  HGBA1C 5.5   CBG: Recent Labs  Lab 02/20/21 1316 02/20/21 1659 02/21/21 0011  GLUCAP 149* 110* 89   Lipid Profile: No results for input(s): CHOL, HDL, LDLCALC, TRIG, CHOLHDL, LDLDIRECT in the last 72 hours. Thyroid Function Tests: Recent Labs    02/20/21 1750  TSH 0.723   Anemia Panel: No results for input(s): VITAMINB12, FOLATE, FERRITIN, TIBC, IRON, RETICCTPCT in the last 72 hours. Sepsis Labs: No results for input(s): PROCALCITON, LATICACIDVEN in the last 168 hours.  Recent Results (from the past 240 hour(s))  Resp Panel by RT-PCR (Flu A&B, Covid) Nasopharyngeal Swab     Status: None   Collection Time: 02/20/21  3:14 PM   Specimen: Nasopharyngeal Swab; Nasopharyngeal(NP) swabs  in vial transport medium  Result Value Ref Range Status   SARS Coronavirus 2 by RT PCR NEGATIVE NEGATIVE Final    Comment: (NOTE) SARS-CoV-2 target nucleic acids are NOT DETECTED.  The SARS-CoV-2 RNA is generally detectable in upper respiratory specimens during the acute phase of infection. The lowest concentration of SARS-CoV-2 viral copies this assay can detect is 138 copies/mL. A negative result does not preclude SARS-Cov-2 infection and should not be used as the sole basis for treatment or other patient management decisions. A negative result may occur with  improper specimen collection/handling, submission of specimen other than nasopharyngeal swab, presence of viral mutation(s) within the areas targeted by this assay, and inadequate number of viral copies(<138 copies/mL). A negative result must be combined with clinical observations, patient history, and epidemiological information. The expected result is Negative.  Fact Sheet for Patients:  EntrepreneurPulse.com.au  Fact Sheet for Healthcare Providers:  IncredibleEmployment.be  This test is no t yet approved or cleared by the [place] FDA and  has been authorized for detection and/or  diagnosis of SARS-CoV-2 by FDA under an Emergency Use Authorization (EUA). This EUA will remain  in effect (meaning this test can be used) for the duration of the COVID-19 declaration under Section 564(b)(1) of the Act, 21 U.S.C.section 360bbb-3(b)(1), unless the authorization is terminated  or revoked sooner.       Influenza A by PCR NEGATIVE NEGATIVE Final   Influenza B by PCR NEGATIVE NEGATIVE Final    Comment: (NOTE) The Xpert Xpress SARS-CoV-2/FLU/RSV plus assay is intended as an aid in the diagnosis of influenza from Nasopharyngeal swab specimens and should not be used as a sole basis for treatment. Nasal washings and aspirates are unacceptable for Xpert Xpress  SARS-CoV-2/FLU/RSV testing.  Fact Sheet for Patients: EntrepreneurPulse.com.au  Fact Sheet for Healthcare Providers: IncredibleEmployment.be  This test is not yet approved or cleared by the [place] FDA and has been authorized for detection and/or diagnosis of SARS-CoV-2 by FDA under an Emergency Use Authorization (EUA). This EUA will remain in effect (meaning this test can be used) for the duration of the COVID-19 declaration under Section 564(b)(1) of the Act, 21 U.S.C. section 360bbb-3(b)(1), unless the authorization is terminated or revoked.  Performed at [facility], [place] [address], [place], [place] [address]          Radiology Studies: CT HEAD WO CONTRAST (5MM)  Result Date: 02/20/2021 CLINICAL DATA:  [place] yesterday at home. Right frontal scalp hematoma. EXAM: CT HEAD WITHOUT CONTRAST TECHNIQUE: Contiguous axial images were obtained from the base of the skull through the vertex without intravenous contrast. COMPARISON:  10/06/2015 FINDINGS: Brain: Stable age related cerebral atrophy, ventriculomegaly and periventricular white matter disease. No extra-axial fluid collections are identified. No CT findings for acute hemispheric infarction or intracranial hemorrhage. No mass lesions. The brainstem and cerebellum are normal. Vascular: Stable vascular calcifications. No aneurysm or hyperdense vessels. Skull: No skull fracture or bone lesions. Sinuses/Orbits: Scattered ethmoid sinus disease. The mastoid air cells and middle ear cavities are clear. The globes are intact. Other: Right supraorbital and right frontal scalp hematoma without underlying skull fracture or radiopaque foreign body. IMPRESSION: 1. Right supraorbital and right frontal scalp hematoma without underlying skull fracture or radiopaque foreign body. 2. Stable age related cerebral atrophy, ventriculomegaly and periventricular white matter disease. No acute intracranial  findings. Electronically Signed   By: Marijo Sanes M.D.   On: 02/20/2021 15:11   CT Cervical Spine Wo Contrast  Result Date: 02/20/2021 CLINICAL DATA:  Trauma.  Status post fall. EXAM: CT CERVICAL SPINE WITHOUT CONTRAST TECHNIQUE: Multidetector CT imaging of the cervical spine was performed without intravenous contrast. Multiplanar CT image reconstructions were also generated. COMPARISON:  06/02/2014 FINDINGS: Alignment: The alignment of the cervical spine is normal. Skull base and vertebrae: The vertebral body heights are well preserved no acute fracture or dislocation identified. Soft tissues and spinal canal: No prevertebral fluid or swelling. No visible canal hematoma. Disc levels: Multilevel disc space narrowing and endplate spurring is noted throughout the cervical spine. This is most severe at C4-5 through C6-7. Upper chest: Negative. Other: None IMPRESSION: 1. No evidence for cervical spine fracture. 2. Cervical degenerative disc disease. Electronically Signed   By: Kerby Moors M.D.   On: 02/20/2021 15:14        Scheduled Meds:  apixaban  2.5 mg Oral BID   cinacalcet  30 mg Oral Daily   diltiazem  240 mg Oral Daily   finasteride  5 mg Oral Daily   fluticasone furoate-vilanterol  1 puff Inhalation  Daily   hydrALAZINE  50 mg Oral TID   insulin aspart  0-9 Units Subcutaneous TID WC   isosorbide dinitrate  30 mg Oral TID   labetalol  100 mg Oral BID   pantoprazole  40 mg Oral Daily   potassium chloride SA  20 mEq Oral Daily   sodium chloride flush  3 mL Intravenous Q12H   tamsulosin  0.4 mg Oral Daily   umeclidinium bromide  1 puff Inhalation Daily   Continuous Infusions:  magnesium sulfate bolus IVPB     potassium chloride       LOS: 0 days    Time spent: 35 minutes    Miel Wisener A Rayland Hamed, MD [facility]   If 7PM-7AM, please contact night-coverage [email or web address]  02/21/2021, 7:29 AM

## 2021-02-21 NOTE — ED Notes (Signed)
CBG 89 

## 2021-02-21 NOTE — Care Management Obs Status (Signed)
MEDICARE OBSERVATION STATUS NOTIFICATION   Patient Details  Name: Roy [name] MRN: [id number] Date of Birth: [birth year]   Medicare Observation Status Notification Given:  Yes    Carles Collet, RN 02/21/2021, 2:40 PM

## 2021-02-21 NOTE — Evaluation (Signed)
Physical Therapy Evaluation Patient Details Name: Roy [name] MRN: [id number] DOB: Dec 24, 1935 Today's Date: 02/21/2021  History of Present Illness  Pt presented to ED with weakness and multiple falls after vomiting/diarrhea. PMH - afib, arthritis, copd, DM, DVT, prostate CA, HTN, gait instability  Clinical Impression  Pt admitted with above diagnosis and presents to PT with functional limitations due to deficits listed below (See PT problem list). Pt needs skilled PT to maximize independence and safety. At this time feel he could benefit from ST-SNF prior to return home alone.         Recommendations for follow up therapy are one component of a multi-disciplinary discharge planning process, led by the attending physician.  Recommendations may be updated based on patient status, additional functional criteria and insurance authorization.  Follow Up Recommendations Skilled nursing-short term rehab (<3 hours/day)    Assistance Recommended at Discharge Frequent or constant Supervision/Assistance  Functional Status Assessment Patient has had a recent decline in their functional status and demonstrates the ability to make significant improvements in function in a reasonable and predictable amount of time.  Equipment Recommendations  Rolling walker (2 wheels) (if he cant locate his)    Recommendations for Other Services       Precautions / Restrictions Precautions Precautions: Fall;Other (comment) Precaution Comments: enteric      Mobility  Bed Mobility Overal bed mobility: Needs Assistance Bed Mobility: Supine to Sit     Supine to sit: Mod assist;HOB elevated     General bed mobility comments: Assist to elevate trunk into sitting    Transfers Overall transfer level: Needs assistance Equipment used: Rolling walker (2 wheels) Transfers: Sit to/from Stand Sit to Stand: Min assist           General transfer comment: Assist to bring hips up and for balance     Ambulation/Gait Ambulation/Gait assistance: Min assist Gait Distance (Feet): 20 Feet Assistive device: Rolling walker (2 wheels) Gait Pattern/deviations: Step-to pattern;Decreased step length - right;Decreased step length - left;Shuffle;Trunk flexed Gait velocity: decr Gait velocity interpretation: <1.31 ft/sec, indicative of household ambulator   General Gait Details: Assist for balance and support. Verbal cues to stay closer to walker  Stairs            Wheelchair Mobility    Modified Rankin (Stroke Patients Only)       Balance Overall balance assessment: Needs assistance Sitting-balance support: No upper extremity supported;Feet supported Sitting balance-Leahy Scale: Fair     Standing balance support: Bilateral upper extremity supported;Reliant on assistive device for balance Standing balance-Leahy Scale: Poor Standing balance comment: walker and supervision for static standnig                             Pertinent Vitals/Pain Pain Assessment: No/denies pain    Home Living Family/patient expects to be discharged to:: Private residence Living Arrangements: Alone Available Help at Discharge: Family;Available PRN/intermittently Type of Home: House Home Access: Stairs to enter   [employer] of Steps: 1   Home Layout: One level Home Equipment: Cane - single [employer]ista (2 wheels) Additional Comments: has life alert    Prior Function Prior Level of Function : Independent/Modified Independent;Driving;History of Falls (last six months)             Mobility Comments: Uses cane ADLs Comments: Does own grocery shopping (says son sometimes helps), eats out at time     Hand Dominance   Dominant Hand: Right  Extremity/Trunk Assessment   Upper Extremity Assessment Upper Extremity Assessment: Defer to OT evaluation    Lower Extremity Assessment Lower Extremity Assessment: Generalized weakness       Communication    Communication: No difficulties  Cognition Arousal/Alertness: Awake/alert Behavior During Therapy: WFL for tasks assessed/performed Overall Cognitive Status: Within Functional Limits for tasks assessed                                          General Comments General comments (skin integrity, edema, etc.): VSS on RA    Exercises     Assessment/Plan    PT Assessment Patient needs continued PT services  PT Problem List Decreased strength;Decreased activity tolerance;Decreased balance;Decreased mobility       PT Treatment Interventions DME instruction;Gait training;Functional mobility training;Therapeutic activities;Therapeutic exercise;Balance training;Patient/family education    PT Goals (Current goals can be found in the Care Plan section)  Acute Rehab PT Goals Patient Stated Goal: agreeable to work toward getting stronger PT Goal Formulation: With patient Time For Goal Achievement: 03/07/21 Potential to Achieve Goals: Good    Frequency Min 3X/week   Barriers to discharge Decreased caregiver support lives alone    Co-evaluation               AM-PAC PT "6 Clicks" Mobility  Outcome Measure Help needed turning from your back to your side while in a flat bed without using bedrails?: A Little Help needed moving from lying on your back to sitting on the side of a flat bed without using bedrails?: A Lot Help needed moving to and from a bed to a chair (including a wheelchair)?: A Little Help needed standing up from a chair using your arms (e.g., wheelchair or bedside chair)?: A Little Help needed to walk in hospital room?: A Little Help needed climbing 3-5 steps with a railing? : Total 6 Click Score: 15    End of Session   Activity Tolerance: Patient tolerated treatment well Patient left: in chair;with call bell/phone within reach Nurse Communication: Mobility status PT Visit Diagnosis: Unsteadiness on feet (R26.81);Other abnormalities of gait and  mobility (R26.89);Repeated falls (R29.6);Muscle weakness (generalized) (M62.81)    Time: 8264-1583 PT Time Calculation (min) (ACUTE ONLY): 40 min   Charges:   PT Evaluation $PT Eval Moderate Complexity: 1 Mod PT Treatments $Gait Training: 23-37 mins        [place] Pager [phone] Office [place] 02/21/2021, 12:21 PM

## 2021-02-21 NOTE — Progress Notes (Signed)
*  PRELIMINARY RESULTS* Echocardiogram 2D Echocardiogram has been performed.  Roy [name] 02/21/2021, 10:14 AM

## 2021-02-21 NOTE — Progress Notes (Signed)
Mobility Specialist Progress Note:   02/21/21 1607  Mobility  Activity Ambulated in hall  Level of Assistance Minimal assist, patient does 75% or more  Assistive Device Four wheel walker  Distance Ambulated (ft) 180 ft  Mobility Ambulated with assistance in hallway  Mobility Response Tolerated well  Mobility performed by Mobility specialist  $Mobility charge 1 Mobility   Pt received in bed willing to participate in mobility. MinA for bed mobility and contact guard throughout ambulation. Required 4 standing rest breaks. Pt returned to bed with call bell in reach and all needs met.   [facility] Public librarian Phone [phone] Secondary Phone [phone]

## 2021-02-21 NOTE — ED Notes (Signed)
Breakfast order placed ?

## 2021-02-22 ENCOUNTER — Encounter (HOSPITAL_COMMUNITY): Payer: Self-pay

## 2021-02-22 ENCOUNTER — Observation Stay (HOSPITAL_COMMUNITY): Payer: Medicare Other

## 2021-02-22 DIAGNOSIS — Z8546 Personal history of malignant neoplasm of prostate: Secondary | ICD-10-CM | POA: Diagnosis not present

## 2021-02-22 DIAGNOSIS — M19041 Primary osteoarthritis, right hand: Secondary | ICD-10-CM | POA: Diagnosis present

## 2021-02-22 DIAGNOSIS — Z833 Family history of diabetes mellitus: Secondary | ICD-10-CM | POA: Diagnosis not present

## 2021-02-22 DIAGNOSIS — Y92019 Unspecified place in single-family (private) house as the place of occurrence of the external cause: Secondary | ICD-10-CM | POA: Diagnosis not present

## 2021-02-22 DIAGNOSIS — K5900 Constipation, unspecified: Secondary | ICD-10-CM | POA: Diagnosis not present

## 2021-02-22 DIAGNOSIS — E1122 Type 2 diabetes mellitus with diabetic chronic kidney disease: Secondary | ICD-10-CM | POA: Diagnosis present

## 2021-02-22 DIAGNOSIS — M19042 Primary osteoarthritis, left hand: Secondary | ICD-10-CM | POA: Diagnosis present

## 2021-02-22 DIAGNOSIS — N401 Enlarged prostate with lower urinary tract symptoms: Secondary | ICD-10-CM | POA: Diagnosis present

## 2021-02-22 DIAGNOSIS — R55 Syncope and collapse: Secondary | ICD-10-CM | POA: Diagnosis present

## 2021-02-22 DIAGNOSIS — D631 Anemia in chronic kidney disease: Secondary | ICD-10-CM | POA: Diagnosis present

## 2021-02-22 DIAGNOSIS — E876 Hypokalemia: Secondary | ICD-10-CM | POA: Diagnosis not present

## 2021-02-22 DIAGNOSIS — Z66 Do not resuscitate: Secondary | ICD-10-CM | POA: Diagnosis not present

## 2021-02-22 DIAGNOSIS — W19XXXA Unspecified fall, initial encounter: Secondary | ICD-10-CM | POA: Diagnosis present

## 2021-02-22 DIAGNOSIS — Z20822 Contact with and (suspected) exposure to covid-19: Secondary | ICD-10-CM | POA: Diagnosis present

## 2021-02-22 DIAGNOSIS — N179 Acute kidney failure, unspecified: Secondary | ICD-10-CM | POA: Diagnosis present

## 2021-02-22 DIAGNOSIS — A084 Viral intestinal infection, unspecified: Secondary | ICD-10-CM | POA: Diagnosis present

## 2021-02-22 DIAGNOSIS — I5032 Chronic diastolic (congestive) heart failure: Secondary | ICD-10-CM | POA: Diagnosis present

## 2021-02-22 DIAGNOSIS — I272 Pulmonary hypertension, unspecified: Secondary | ICD-10-CM | POA: Diagnosis present

## 2021-02-22 DIAGNOSIS — J449 Chronic obstructive pulmonary disease, unspecified: Secondary | ICD-10-CM | POA: Diagnosis present

## 2021-02-22 DIAGNOSIS — N1832 Chronic kidney disease, stage 3b: Secondary | ICD-10-CM | POA: Diagnosis present

## 2021-02-22 DIAGNOSIS — E860 Dehydration: Secondary | ICD-10-CM | POA: Diagnosis present

## 2021-02-22 DIAGNOSIS — N138 Other obstructive and reflux uropathy: Secondary | ICD-10-CM | POA: Diagnosis present

## 2021-02-22 DIAGNOSIS — I959 Hypotension, unspecified: Secondary | ICD-10-CM | POA: Diagnosis not present

## 2021-02-22 DIAGNOSIS — N390 Urinary tract infection, site not specified: Secondary | ICD-10-CM | POA: Diagnosis not present

## 2021-02-22 DIAGNOSIS — G4733 Obstructive sleep apnea (adult) (pediatric): Secondary | ICD-10-CM | POA: Diagnosis present

## 2021-02-22 DIAGNOSIS — I4819 Other persistent atrial fibrillation: Secondary | ICD-10-CM | POA: Diagnosis present

## 2021-02-22 DIAGNOSIS — I130 Hypertensive heart and chronic kidney disease with heart failure and stage 1 through stage 4 chronic kidney disease, or unspecified chronic kidney disease: Secondary | ICD-10-CM | POA: Diagnosis present

## 2021-02-22 LAB — CBC
HCT: 32.7 % — ABNORMAL LOW (ref 39.0–52.0)
Hemoglobin: 10.3 g/dL — ABNORMAL LOW (ref 13.0–17.0)
MCH: 30.2 pg (ref 26.0–34.0)
MCHC: 31.5 g/dL (ref 30.0–36.0)
MCV: 95.9 fL (ref 80.0–100.0)
Platelets: 154 10*3/uL (ref 150–400)
RBC: 3.41 MIL/uL — ABNORMAL LOW (ref 4.22–5.81)
RDW: 15.7 % — ABNORMAL HIGH (ref 11.5–15.5)
WBC: 10.5 10*3/uL (ref 4.0–10.5)
nRBC: 0.2 % (ref 0.0–0.2)

## 2021-02-22 LAB — BASIC METABOLIC PANEL
Anion gap: 9 (ref 5–15)
BUN: 63 mg/dL — ABNORMAL HIGH (ref 8–23)
CO2: 22 mmol/L (ref 22–32)
Calcium: 8.7 mg/dL — ABNORMAL LOW (ref 8.9–10.3)
Chloride: 106 mmol/L (ref 98–111)
Creatinine, Ser: 3.17 mg/dL — ABNORMAL HIGH (ref 0.61–1.24)
GFR, Estimated: 19 mL/min — ABNORMAL LOW (ref >60)
Glucose, Bld: 159 mg/dL — ABNORMAL HIGH (ref 70–99)
Potassium: 3.6 mmol/L (ref 3.5–5.1)
Sodium: 137 mmol/L (ref 135–145)

## 2021-02-22 LAB — GLUCOSE, CAPILLARY
Glucose-Capillary: 122 mg/dL — ABNORMAL HIGH (ref 70–99)
Glucose-Capillary: 114 mg/dL — ABNORMAL HIGH (ref 70–99)
Glucose-Capillary: 105 mg/dL — ABNORMAL HIGH (ref 70–99)
Glucose-Capillary: 122 mg/dL — ABNORMAL HIGH (ref 70–99)

## 2021-02-22 MED ORDER — CHLORHEXIDINE GLUCONATE CLOTH 2 % EX PADS
6.0000 | MEDICATED_PAD | Freq: Every day | CUTANEOUS | Status: DC
  Administered 2021-02-22 – 2021-02-26 (×5): 6 via TOPICAL

## 2021-02-22 MED ORDER — SODIUM CHLORIDE 0.9 % IV SOLN
1.0000 g | INTRAVENOUS | Status: DC
  Administered 2021-02-22 – 2021-02-25 (×4): 1 g via INTRAVENOUS
  Filled 2021-02-22 (×5): qty 10

## 2021-02-22 MED ORDER — SODIUM CHLORIDE 0.9 % IV SOLN: INTRAVENOUS | Status: AC

## 2021-02-22 NOTE — Evaluation (Signed)
Occupational Therapy Evaluation Patient Details Name: Roy [name] MRN: [id number] DOB: [birth year] Today's Date: 02/22/2021   History of Present Illness Pt presented to ED with weakness and multiple falls after vomiting/diarrhea. PMH - afib, arthritis, copd, DM, DVT, prostate CA, HTN, gait instability   Clinical Impression   Pt was functioning independently in ADL and IADL and walking with a cane prior to admission. He lives alone with intermittent assist of his son and drives. Pt presents with generalized weakness with each movement appearing effortful and requiring increased time. Pt with baseline shoulder limitations. He demonstrates poor standing balance, requiring B UE support for OOB. Pt needs min to max assist for ADL. Recommending ST rehab in SNF prior to return home.     Recommendations for follow up therapy are one component of a multi-disciplinary discharge planning process, led by the attending physician.  Recommendations may be updated based on patient status, additional functional criteria and insurance authorization.   Follow Up Recommendations  Skilled nursing-short term rehab (<3 hours/day)    Assistance Recommended at Discharge Frequent or constant Supervision/Assistance  Functional Status Assessment  Patient has had a recent decline in their functional status and demonstrates the ability to make significant improvements in function in a reasonable and predictable amount of time.  Equipment Recommendations  BSC/3in1    Recommendations for Other Services       Precautions / Restrictions Precautions Precautions: Fall      Mobility Bed Mobility Overal bed mobility: Needs Assistance Bed Mobility: Supine to Sit     Supine to sit: Mod assist;HOB elevated     General bed mobility comments: assist to raise trunk, increased time and effort    Transfers Overall transfer level: Needs assistance Equipment used: Rollator (4 wheels) Transfers: Sit to/from  Stand Sit to Stand: Min guard;From elevated surface           General transfer comment: increased time and effort from elevated bed and BSC      Balance Overall balance assessment: Needs assistance Sitting-balance support: No upper extremity supported;Feet supported Sitting balance-Leahy Scale: Fair     Standing balance support: Bilateral upper extremity supported;Reliant on assistive device for balance Standing balance-Leahy Scale: Poor Standing balance comment: reliant on B UE support                           ADL either performed or assessed with clinical judgement   ADL Overall ADL's : Needs assistance/impaired Eating/Feeding: Set up;Sitting   Grooming: Wash/dry hands;Wash/dry face;Sitting;Set up   Upper Body Bathing: Minimal assistance;Sitting   Lower Body Bathing: Maximal assistance;Sit to/from stand   Upper Body Dressing : Minimal assistance;Sitting   Lower Body Dressing: Maximal assistance;Sit to/from stand   Toilet Transfer: Ambulation;Min guard;BSC/3in1;Rollator (4 wheels)   Toileting- Water quality scientist and Hygiene: Moderate assistance;Sit to/from stand       Functional mobility during ADLs: Engineer, manufacturing (4 wheels) General ADL Comments: all movement is effortful and requires extended period of time to complete     Vision Baseline Vision/History: 1 Wears glasses Ability to See in Adequate Light: 0 Adequate Patient Visual Report: No change from baseline       Perception     Praxis      Pertinent Vitals/Pain Pain Assessment: No/denies pain     Hand Dominance Right   Extremity/Trunk Assessment Upper Extremity Assessment Upper Extremity Assessment: RUE deficits/detail;LUE deficits/detail RUE Deficits / Details: longstanding shoulder limitations RUE Coordination: decreased gross motor LUE Deficits /  Details: longstanding shoulder limitations LUE Coordination: decreased gross motor   Lower Extremity Assessment Lower  Extremity Assessment: Defer to PT evaluation   Cervical / Trunk Assessment Cervical / Trunk Assessment: Other exceptions (weakness)   Communication Communication Communication: No difficulties   Cognition Arousal/Alertness: Awake/alert Behavior During Therapy: WFL for tasks assessed/performed;Flat affect Overall Cognitive Status: Within Functional Limits for tasks assessed                                       General Comments       Exercises     Shoulder Instructions      Home Living Family/patient expects to be discharged to:: Private residence Living Arrangements: Alone Available Help at Discharge: Family;Available PRN/intermittently Type of Home: House Home Access: Stairs to enter Entrance Stairs-Number of Steps: 1   Home Layout: One level     Bathroom Shower/Tub: Tub/shower unit;Walk-in shower   Bathroom Toilet: Handicapped height     Home Equipment: [place] - single Barista (2 wheels)   Additional Comments: has life alert      Prior Functioning/Environment Prior Level of Function : Independent/Modified Independent;Driving;History of Falls (last six months)             Mobility Comments: Uses cane ADLs Comments: Does own grocery shopping (says son sometimes helps), eats out at time        OT Problem List: Decreased strength;Impaired balance (sitting and/or standing);Decreased activity tolerance      OT Treatment/Interventions: Self-care/ADL training;Patient/family education;Balance training;Therapeutic activities;DME and/or AE instruction    OT Goals(Current goals can be found in the care plan section) Acute Rehab OT Goals OT Goal Formulation: With patient Time For Goal Achievement: 03/08/21 Potential to Achieve Goals: Good ADL Goals Pt Will Perform Grooming: with supervision;standing (at least 2 activities) Pt Will Perform Lower Body Bathing: sit to/from stand;with min assist Pt Will Perform Lower Body Dressing: with  min assist;sit to/from stand Pt Will Transfer to Toilet: with supervision;ambulating;bedside commode (over toilet) Pt Will Perform Toileting - Clothing Manipulation and hygiene: with supervision;sit to/from stand Additional ADL Goal #1: Pt will perform bed mobility with supervision in preparation for ADL.  OT Frequency: Min 2X/week   Barriers to D/C: Decreased caregiver support          Co-evaluation              AM-PAC OT "6 Clicks" Daily Activity     Outcome Measure Help from another person eating meals?: A Little Help from another person taking care of personal grooming?: A Little Help from another person toileting, which includes using toliet, bedpan, or urinal?: A Lot Help from another person bathing (including washing, rinsing, drying)?: A Lot Help from another person to put on and taking off regular upper body clothing?: A Little Help from another person to put on and taking off regular lower body clothing?: A Lot 6 Click Score: 15   End of Session Equipment Utilized During Treatment: Gait belt;Rollator (4 wheels)  Activity Tolerance: Patient tolerated treatment well Patient left: in chair;with call bell/phone within reach;with chair alarm set  OT Visit Diagnosis: Unsteadiness on feet (R26.81);Other abnormalities of gait and mobility (R26.89);History of falling (Z91.81)                Time: 8250-5397 OT Time Calculation (min): 12 min Charges:  OT General Charges $OT Visit: 1 Visit OT Evaluation $OT Eval Moderate Complexity: 1 Mod  Nestor Lewandowsky, OTR/L Acute Rehabilitation Services Pager: [phone] Office: [phone]   Malka So 02/22/2021, 10:25 AM

## 2021-02-22 NOTE — NC FL2 (Addendum)
[place] LEVEL OF CARE SCREENING TOOL     IDENTIFICATION  Patient Name: Roy [name] Birthdate: 02-Nov-1935 Sex: male Admission Date (Current Location): 02/20/2021  [facility] [place] Number:  Herbalist and Address:  [facility], [place] [address], [place], [place] [address]      Provider Number: [id number]  Attending Physician Name and Address:  Elmarie Shiley, MD  Relative Name and Phone Number:  Prabhav, [place] [phone])   [phone]    Current Level of Care: Hospital Recommended Level of Care: [place] Prior Approval Number:    Date Approved/Denied:   PASRR Number:  [id number]  Discharge Plan: SNF    Current Diagnoses: Patient Active Problem List   Diagnosis Date Noted   Syncope and collapse 02/20/2021   Hypokalemia 02/20/2021   Elevated LFTs 02/20/2021   Leukocytosis 02/20/2021   Acute renal failure superimposed on stage 3 chronic kidney disease ([place]) 02/20/2021   Chronic diastolic CHF (congestive heart failure) ([place]) 02/20/2021   Pulmonary hypertension, unspecified ([place]) 12/17/2019   Persistent atrial fibrillation ([place]) 12/17/2019   Leg edema 11/18/2019   Exertional dyspnea 11/18/2019   Infected cyst of skin 01/25/2016   Abscess of external ear, left 01/11/2016   Acute deep vein thrombosis (DVT) of femoral vein of left lower extremity (HCC)    Bacteremia due to Enterococcus 10/09/2015   CKD (chronic kidney disease) stage 3, GFR 30-59 ml/min (HCC) 10/06/2015   Sepsis secondary to UTI ([place]) 10/06/2015   UTI (lower urinary tract infection) 10/06/2015   Benign prostatic hyperplasia with urinary obstruction 07/24/2015   DM2 (diabetes mellitus, type 2) ([place]) 01/06/2007   EXOGENOUS OBESITY 01/06/2007   OSA on CPAP 01/06/2007   Essential hypertension 01/06/2007   Chronic obstructive airway disease with asthma ([place]) 01/06/2007    Orientation RESPIRATION BLADDER Height & Weight     Self, Time,  Situation, Place  Normal Continent, External catheter Weight: 203 lb 0.7 oz (92.1 kg) Height:  5\' 10"  (177.8 cm)  BEHAVIORAL SYMPTOMS/MOOD NEUROLOGICAL BOWEL NUTRITION STATUS      Continent Diet (See DC Summary)  AMBULATORY STATUS COMMUNICATION OF NEEDS Skin   Limited Assist Verbally Normal                       Personal Care Assistance Level of Assistance  Bathing, Feeding, Dressing Bathing Assistance: Limited assistance Feeding assistance: Independent Dressing Assistance: Limited assistance     Functional Limitations Info  Sight, Hearing, Speech Sight Info: Adequate Hearing Info: Adequate Speech Info: Adequate    SPECIAL CARE FACTORS FREQUENCY  PT (By licensed PT), OT (By licensed OT)     PT Frequency: 3x a week OT Frequency: 3x a week            Contractures Contractures Info: Not present    Additional Factors Info  Allergies, Code Status Code Status Info: Full Allergies Info: NKA           Current Medications (02/22/2021):  This is the current hospital active medication list Current Facility-Administered Medications  Medication Dose Route Frequency Provider Last Rate Last Admin   0.9 %  sodium chloride infusion   Intravenous Continuous Regalado, Belkys A, MD 75 mL/hr at 02/22/21 0926 New Bag at 02/22/21 0926   acetaminophen (TYLENOL) tablet 650 mg  650 mg Oral Q6H PRN Orma Flaming, MD       Or   acetaminophen (TYLENOL) suppository 650 mg  650 mg Rectal Q6H PRN  Orma Flaming, MD       apixaban Arne [place]) tablet 2.5 mg  2.5 mg Oral BID Orma Flaming, MD   2.5 mg at 02/22/21 0926   cefTRIAXone (ROCEPHIN) 1 g in sodium chloride 0.9 % 100 mL IVPB  1 g Intravenous Q24H Regalado, Belkys A, MD       cinacalcet (SENSIPAR) tablet 30 mg  30 mg Oral Daily Orma Flaming, MD   30 mg at 02/22/21 4132   diltiazem (CARDIZEM CD) 24 hr capsule 240 mg  240 mg Oral Daily Orma Flaming, MD   240 mg at 02/22/21 4401   finasteride (PROSCAR) tablet 5 mg  5 mg Oral Daily  Orma Flaming, MD   5 mg at 02/22/21 0926   fluticasone furoate-vilanterol (BREO ELLIPTA) 100-25 MCG/ACT 1 puff  1 puff Inhalation Daily Heloise Purpura, RPH   1 puff at 02/22/21 0731   insulin aspart (novoLOG) injection 0-9 Units  0-9 Units Subcutaneous TID WC Orma Flaming, MD   1 Units at 02/22/21 0272   isosorbide dinitrate (ISORDIL) tablet 30 mg  30 mg Oral TID Orma Flaming, MD   30 mg at 02/22/21 0925   labetalol (NORMODYNE) tablet 100 mg  100 mg Oral BID Orma Flaming, MD   100 mg at 02/22/21 0926   ondansetron (ZOFRAN) tablet 4 mg  4 mg Oral Q6H PRN Orma Flaming, MD       Or   ondansetron [facility]) injection 4 mg  4 mg Intravenous Q6H PRN Orma Flaming, MD       pantoprazole (PROTONIX) EC tablet 40 mg  40 mg Oral Daily Orma Flaming, MD   40 mg at 02/22/21 5366   potassium chloride SA (KLOR-CON M) CR tablet 20 mEq  20 mEq Oral Daily Orma Flaming, MD   20 mEq at 02/22/21 0925   sodium chloride flush (NS) 0.9 % injection 3 mL  3 mL Intravenous Q12H Orma Flaming, MD   3 mL at 02/22/21 0926   tamsulosin (FLOMAX) capsule 0.4 mg  0.4 mg Oral Daily Orma Flaming, MD   0.4 mg at 02/22/21 0926   umeclidinium bromide (INCRUSE ELLIPTA) 62.5 MCG/ACT 1 puff  1 puff Inhalation Daily Heloise Purpura, RPH   1 puff at 02/22/21 4403     Discharge Medications: Please see discharge summary for a list of discharge medications.  Relevant Imaging Results:  Relevant Lab Results:   Additional Information SSN: [id number]; Moderna COVID-19 Vaccine 01/18/2020 , 06/04/2019 , 05/07/2019  Reece Agar, LCSWA

## 2021-02-22 NOTE — Progress Notes (Addendum)
PROGRESS NOTE    Roy [name]  EGB:[id number] DOB: Jun 19, 1935 DOA: 02/20/2021 PCP: Rogers Blocker, MD   Brief Narrative: 85 year old with past medical history significant for chronic A. fib on Eliquis, hypertension, diabetes type 2, OSA on CPAP, chronic obstructive airway disease with asthma, BPH, anemia of chronic kidney disease with weekly injection who presents to the ED after a fall sometimes the night prior to admission.  He was found by his son the day of admission.  Unknown for how long patient was down, unclear if syncope versus mechanical fall. Patient had a gastrointestinal illness with diarrhea nausea vomiting that is started the day prior to admission.  Poor oral intake. Patient was found to have leukocytosis white blood cell 15, hemoglobin 12, potassium 3.0, creatinine 3.0, mild transaminases, CT head right supraorbital and right frontal scalp hematoma without underlying skull fracture or foreign body.  CT cervical spine no acute fracture.  Patient admitted for recurrent fall questionable syncope, AKI.  Assessment & Plan:   Principal Problem:   Syncope and collapse Active Problems:   DM2 (diabetes mellitus, type 2) (HCC)   OSA on CPAP   Essential hypertension   Chronic obstructive airway disease with asthma (HCC)   Persistent atrial fibrillation (HCC)   Benign prostatic hyperplasia with urinary obstruction   Hypokalemia   Elevated LFTs   Leukocytosis   Acute renal failure superimposed on stage 3 chronic kidney disease (HCC)   Chronic diastolic CHF (congestive heart failure) ([place])   1-Recurrent Fall, questionable Syncope: -Orthostatic vitals negative.  -Continue with IV fluid -Echo; normal ejection fraction, moderate pericardial effusion resolved -PT OT consulted -Suspect related to dehydration,  related to recent GI illness.  Hypotensive overnight. Plan to hold hydralazine.   2-AKI superimposed on CKD stage IIIB, hematuria.  Creatinine 1 year ago 1.8 Suspect  related to hypovolemia. Received  IV fluids. Strict I's and O's Cr increase to 3. Develops urinary retention. Foley catheter place.  He will probably  need to be discharge with foley.  Renal US. Negative for hydronephrosis.  Continue with IV fluids.  Follow urine culture. Started IV ceftriaxone.   3-Hypokalemia: Replaced.  4-Transaminases: Resolved Hepatitis panel negative  5-Diarrhea right lower quadrant pain Improved.  6 leukocytosis: Likely reactive   Gait instability, frequent fall: Check B12.  PT  Persistent A. fib: Rate controlled, continue with Eliquis and Cardizem  Chronic diastolic heart failure: Compensated.  Holding diuretics Hypertension: Continue with Cardizem hydralazine and labetalol Diabetes type 2: Continue with sliding scale insulin COPD: Stable OSA on CPAP continue with CPAP     Estimated body mass index is 29.13 kg/m as calculated from the following:   Height as of this encounter: 5\' 10"  (1.778 m).   Weight as of this encounter: 92.1 kg.   DVT prophylaxis: Eliquis Code Status: Full code Family Communication: Son updated 12/01 Disposition Plan:  Status is: Observation  The patient remains OBS appropriate and will d/c before 2 midnights.       Consultants:  none  Procedures:  ECHO  Antimicrobials:    Subjective: He develops urine retention overnight 1.3 L urine retention.  This am had 600, foley catheter placed.   Objective: Vitals:   02/22/21 0109 02/22/21 0534 02/22/21 0724 02/22/21 0732  BP: 108/63 130/60 125/66   Pulse: (!) 50  60 62  Resp: 16 20 20 18   Temp: 97.9 F (36.6 C) 98 F (36.7 C) 98 F (36.7 C)   TempSrc: Oral Oral Oral   SpO2: 100% 99%  98% 98%  Weight:  92.1 kg    Height:        Intake/Output Summary (Last 24 hours) at 02/22/2021 0819 Last data filed at 02/22/2021 0534 Gross per 24 hour  Intake 1714.14 ml  Output 1300 ml  Net 414.14 ml    Filed Weights   02/20/21 1257 02/21/21 1338 02/22/21 0534   Weight: 96.6 kg 91 kg 92.1 kg    Examination:  General exam: NAD Respiratory system: CTA Cardiovascular system: S 1, S 2 RRR Gastrointestinal system:BS present, soft, nt. Central nervous system: alert, follows command Extremities: Symmetric power   Data Reviewed: I have personally reviewed following labs and imaging studies  CBC: Recent Labs  Lab 02/20/21 1312 02/21/21 0326  WBC 15.1* 11.0*  HGB 12.7* 11.9*  HCT 39.5 36.0*  MCV 92.7 92.5  PLT 188 630    Basic Metabolic Panel: Recent Labs  Lab 02/20/21 1312 02/21/21 0326 02/22/21 0045  NA 139 138 137  K 3.0* 2.8* 3.6  CL 103 106 106  CO2 26 24 22   GLUCOSE 134* 100* 159*  BUN 66* 61* 63*  CREATININE 3.08* 2.88* 3.17*  CALCIUM 9.5 9.0 8.7*  MG 1.7  --   --     GFR: Estimated Creatinine Clearance: 20.1 mL/min (A) (by C-G formula based on SCr of 3.17 mg/dL (H)). Liver Function Tests: Recent Labs  Lab 02/20/21 1312 02/21/21 0326  AST 59* 37  ALT 54* 42  ALKPHOS 87 70  BILITOT 1.4* 1.4*  PROT 5.1* 4.6*  ALBUMIN 2.6* 2.2*    No results for input(s): LIPASE, AMYLASE in the last 168 hours. No results for input(s): AMMONIA in the last 168 hours. Coagulation Profile: No results for input(s): INR, PROTIME in the last 168 hours. Cardiac Enzymes: Recent Labs  Lab 02/20/21 1312  CKTOTAL 496*    BNP (last 3 results) No results for input(s): PROBNP in the last 8760 hours. HbA1C: Recent Labs    02/20/21 1630  HGBA1C 5.5    CBG: Recent Labs  Lab 02/21/21 0802 02/21/21 1244 02/21/21 1628 02/21/21 2051 02/22/21 0607  GLUCAP 88 142* 120* 171* 122*    Lipid Profile: No results for input(s): CHOL, HDL, LDLCALC, TRIG, CHOLHDL, LDLDIRECT in the last 72 hours. Thyroid Function Tests: Recent Labs    02/20/21 1750  TSH 0.723    Anemia Panel: Recent Labs    02/21/21 0800  VITAMINB12 406   Sepsis Labs: No results for input(s): PROCALCITON, LATICACIDVEN in the last 168 hours.  Recent  Results (from the past 240 hour(s))  Resp Panel by RT-PCR (Flu A&B, Covid) Nasopharyngeal Swab     Status: None   Collection Time: 02/20/21  3:14 PM   Specimen: Nasopharyngeal Swab; Nasopharyngeal(NP) swabs in vial transport medium  Result Value Ref Range Status   SARS Coronavirus 2 by RT PCR NEGATIVE NEGATIVE Final    Comment: (NOTE) SARS-CoV-2 target nucleic acids are NOT DETECTED.  The SARS-CoV-2 RNA is generally detectable in upper respiratory specimens during the acute phase of infection. The lowest concentration of SARS-CoV-2 viral copies this assay can detect is 138 copies/mL. A negative result does not preclude SARS-Cov-2 infection and should not be used as the sole basis for treatment or other patient management decisions. A negative result may occur with  improper specimen collection/handling, submission of specimen other than nasopharyngeal swab, presence of viral mutation(s) within the areas targeted by this assay, and inadequate number of viral copies(<138 copies/mL). A negative result must be combined with clinical  observations, patient history, and epidemiological information. The expected result is Negative.  Fact Sheet for Patients:  EntrepreneurPulse.com.au  Fact Sheet for Healthcare Providers:  IncredibleEmployment.be  This test is no t yet approved or cleared by the [place] FDA and  has been authorized for detection and/or diagnosis of SARS-CoV-2 by FDA under an Emergency Use Authorization (EUA). This EUA will remain  in effect (meaning this test can be used) for the duration of the COVID-19 declaration under Section 564(b)(1) of the Act, 21 U.S.C.section 360bbb-3(b)(1), unless the authorization is terminated  or revoked sooner.       Influenza A by PCR NEGATIVE NEGATIVE Final   Influenza B by PCR NEGATIVE NEGATIVE Final    Comment: (NOTE) The Xpert Xpress SARS-CoV-2/FLU/RSV plus assay is intended as an aid in the  diagnosis of influenza from Nasopharyngeal swab specimens and should not be used as a sole basis for treatment. Nasal washings and aspirates are unacceptable for Xpert Xpress SARS-CoV-2/FLU/RSV testing.  Fact Sheet for Patients: EntrepreneurPulse.com.au  Fact Sheet for Healthcare Providers: IncredibleEmployment.be  This test is not yet approved or cleared by the [place] FDA and has been authorized for detection and/or diagnosis of SARS-CoV-2 by FDA under an Emergency Use Authorization (EUA). This EUA will remain in effect (meaning this test can be used) for the duration of the COVID-19 declaration under Section 564(b)(1) of the Act, 21 U.S.C. section 360bbb-3(b)(1), unless the authorization is terminated or revoked.  Performed at [facility], [place] [address], [place], [place] [address]           Radiology Studies: CT HEAD WO CONTRAST (5MM)  Result Date: 02/20/2021 CLINICAL DATA:  [place] yesterday at home. Right frontal scalp hematoma. EXAM: CT HEAD WITHOUT CONTRAST TECHNIQUE: Contiguous axial images were obtained from the base of the skull through the vertex without intravenous contrast. COMPARISON:  10/06/2015 FINDINGS: Brain: Stable age related cerebral atrophy, ventriculomegaly and periventricular white matter disease. No extra-axial fluid collections are identified. No CT findings for acute hemispheric infarction or intracranial hemorrhage. No mass lesions. The brainstem and cerebellum are normal. Vascular: Stable vascular calcifications. No aneurysm or hyperdense vessels. Skull: No skull fracture or bone lesions. Sinuses/Orbits: Scattered ethmoid sinus disease. The mastoid air cells and middle ear cavities are clear. The globes are intact. Other: Right supraorbital and right frontal scalp hematoma without underlying skull fracture or radiopaque foreign body. IMPRESSION: 1. Right supraorbital and right frontal scalp hematoma without  underlying skull fracture or radiopaque foreign body. 2. Stable age related cerebral atrophy, ventriculomegaly and periventricular white matter disease. No acute intracranial findings. Electronically Signed   By: Marijo Sanes M.D.   On: 02/20/2021 15:11   CT Cervical Spine Wo Contrast  Result Date: 02/20/2021 CLINICAL DATA:  Trauma.  Status post fall. EXAM: CT CERVICAL SPINE WITHOUT CONTRAST TECHNIQUE: Multidetector CT imaging of the cervical spine was performed without intravenous contrast. Multiplanar CT image reconstructions were also generated. COMPARISON:  06/02/2014 FINDINGS: Alignment: The alignment of the cervical spine is normal. Skull base and vertebrae: The vertebral body heights are well preserved no acute fracture or dislocation identified. Soft tissues and spinal canal: No prevertebral fluid or swelling. No visible canal hematoma. Disc levels: Multilevel disc space narrowing and endplate spurring is noted throughout the cervical spine. This is most severe at C4-5 through C6-7. Upper chest: Negative. Other: None IMPRESSION: 1. No evidence for cervical spine fracture. 2. Cervical degenerative disc disease. Electronically Signed   By: Kerby Moors M.D.   On:  02/20/2021 15:14   ECHOCARDIOGRAM COMPLETE  Result Date: 02/21/2021    ECHOCARDIOGRAM REPORT   Patient Name:   SKILER OLDEN Date of Exam: 02/21/2021 Medical Rec #:  [id number]         Height:       70.0 in Accession #:    [id number]        Weight:       213.0 lb Date of Birth:  [birth year]         BSA:          2.144 m Patient Age:    8 years          BP:           162/86 mmHg Patient Gender: M                 HR:           99 bpm. Exam Location:  Inpatient Procedure: 2D Echo, Cardiac Doppler and Color Doppler Indications:    Syncope  History:        Patient has prior history of Echocardiogram examinations, most                 recent 10/08/2015. CHF, COPD, Arrythmias:Atrial Fibrillation,                 Signs/Symptoms:Syncope; Risk  Factors:Diabetes and Hypertension.  Sonographer:    Wenda Low Referring Phys: [id number] [facility]  Sonographer Comments: Image acquisition challenging due to respiratory motion. IMPRESSIONS  1. Left ventricular ejection fraction, by estimation, is 55 to 60%. The left ventricle has normal function. The left ventricle has no regional wall motion abnormalities. There is moderate asymmetric left ventricular hypertrophy of the basal-septal segment. Left ventricular diastolic parameters are indeterminate.  2. Right ventricular systolic function is normal. The right ventricular size is normal. There is moderately elevated pulmonary artery systolic pressure.  3. Left atrial size was moderately dilated.  4. The mitral valve is normal in structure. Mild mitral valve regurgitation. No evidence of mitral stenosis.  5. Tricuspid valve regurgitation is mild to moderate.  6. The aortic valve is normal in structure. Aortic valve regurgitation is mild. Aortic valve sclerosis/calcification is present, without any evidence of aortic stenosis.  7. The inferior vena cava is normal in size with greater than 50% respiratory variability, suggesting right atrial pressure of 3 mmHg. Comparison(s): A prior study was performed on 08/08/2020. Echo done with [facility] (08/08/2020), Moderate pericardial effusion had resolved. FINDINGS  Left Ventricle: Left ventricular ejection fraction, by estimation, is 55 to 60%. The left ventricle has normal function. The left ventricle has no regional wall motion abnormalities. The left ventricular internal cavity size was normal in size. There is  moderate asymmetric left ventricular hypertrophy of the basal-septal segment. Left ventricular diastolic parameters are indeterminate. Right Ventricle: The right ventricular size is normal. No increase in right ventricular wall thickness. Right ventricular systolic function is normal. There is moderately elevated pulmonary artery systolic  pressure. The tricuspid regurgitant velocity is 3.30 m/s, and with an assumed right atrial pressure of 8 mmHg, the estimated right ventricular systolic pressure is 09.7 mmHg. Left Atrium: Left atrial size was moderately dilated. Right Atrium: Right atrial size was normal in size. Pericardium: There is no evidence of pericardial effusion. Mitral Valve: The mitral valve is normal in structure. Mild to moderate mitral annular calcification. Mild mitral valve regurgitation. No evidence of mitral valve stenosis. MV peak gradient, 4.7 mmHg. The mean mitral valve gradient  is 2.0 mmHg. Tricuspid Valve: The tricuspid valve is normal in structure. Tricuspid valve regurgitation is mild to moderate. No evidence of tricuspid stenosis. Aortic Valve: The aortic valve is normal in structure. There is mild aortic valve annular calcification. Aortic valve regurgitation is mild. Aortic valve sclerosis/calcification is present, without any evidence of aortic stenosis. Aortic valve mean gradient measures 5.0 mmHg. Aortic valve peak gradient measures 8.8 mmHg. Aortic valve area, by VTI measures 1.71 cm. Pulmonic Valve: The pulmonic valve was normal in structure. Pulmonic valve regurgitation is trivial. No evidence of pulmonic stenosis. Aorta: The aortic root is normal in size and structure. Venous: The inferior vena cava is normal in size with greater than 50% respiratory variability, suggesting right atrial pressure of 3 mmHg. IAS/Shunts: No atrial level shunt detected by color flow Doppler.  LEFT VENTRICLE PLAX 2D LVIDd:         4.70 cm   Diastology LVIDs:         2.90 cm   LV e' medial:    8.38 cm/s LV PW:         1.10 cm   LV E/e' medial:  11.9 LV IVS:        1.40 cm   LV e' lateral:   10.90 cm/s LVOT diam:     1.90 cm   LV E/e' lateral: 9.2 LV SV:         50 LV SV Index:   23 LVOT Area:     2.84 cm  RIGHT VENTRICLE RV Basal diam:  3.50 cm RV Mid diam:    2.90 cm RV S prime:     14.80 cm/s TAPSE (M-mode): 1.9 cm LEFT ATRIUM               Index        RIGHT ATRIUM           Index LA diam:        4.90 cm  2.29 cm/m   RA Area:     21.50 cm LA Vol (A2C):   76.0 ml  35.45 ml/m  RA Volume:   63.00 ml  29.39 ml/m LA Vol (A4C):   103.0 ml 48.04 ml/m LA Biplane Vol: 96.1 ml  44.82 ml/m  AORTIC VALVE                    PULMONIC VALVE AV Area (Vmax):    1.86 cm     PV Vmax:       1.03 m/s AV Area (Vmean):   1.68 cm     PV Peak grad:  4.2 mmHg AV Area (VTI):     1.71 cm AV Vmax:           148.00 cm/s AV Vmean:          98.400 cm/s AV VTI:            0.294 m AV Peak Grad:      8.8 mmHg AV Mean Grad:      5.0 mmHg LVOT Vmax:         97.00 cm/s LVOT Vmean:        58.200 cm/s LVOT VTI:          0.178 m LVOT/AV VTI ratio: 0.60  AORTA Ao Root diam: 3.00 cm Ao Asc diam:  2.90 cm MITRAL VALVE               TRICUSPID VALVE MV Area (PHT): 4.33 cm    TR Peak  grad:   43.6 mmHg MV Area VTI:   2.44 cm    TR Vmax:        330.00 cm/s MV Peak grad:  4.7 mmHg MV Mean grad:  2.0 mmHg    SHUNTS MV Vmax:       1.08 m/s    Systemic VTI:  0.18 m MV Vmean:      56.6 cm/s   Systemic Diam: 1.90 cm MV Decel Time: 175 msec MV E velocity: 99.80 cm/s Kardie Tobb DO Electronically signed by Berniece Salines DO Signature Date/Time: 02/21/2021/11:32:50 AM    Final         Scheduled Meds:  apixaban  2.5 mg Oral BID   cinacalcet  30 mg Oral Daily   diltiazem  240 mg Oral Daily   finasteride  5 mg Oral Daily   fluticasone furoate-vilanterol  1 puff Inhalation Daily   insulin aspart  0-9 Units Subcutaneous TID WC   isosorbide dinitrate  30 mg Oral TID   labetalol  100 mg Oral BID   pantoprazole  40 mg Oral Daily   potassium chloride SA  20 mEq Oral Daily   sodium chloride flush  3 mL Intravenous Q12H   tamsulosin  0.4 mg Oral Daily   umeclidinium bromide  1 puff Inhalation Daily   Continuous Infusions:  sodium chloride       LOS: 0 days    Time spent: 35 minutes    Jadyn Barge A Kali Ambler, MD [facility]   If 7PM-7AM, please contact  night-coverage [email or web address]  02/22/2021, 8:19 AM

## 2021-02-22 NOTE — Progress Notes (Signed)
   02/21/21 2046  Assess: MEWS Score  BP (!) 90/57  Assess: MEWS Score  MEWS Temp 0  MEWS Systolic 1  MEWS Pulse 0  MEWS RR 1  MEWS LOC 0  MEWS Score 2  MEWS Score Color Yellow  Assess: if the MEWS score is Yellow or Red  Were vital signs taken at a resting state? Yes  Focused Assessment No change from prior assessment  MEWS guidelines implemented *See Row Information* Yes  Treat  MEWS Interventions Other (Comment) (see notes)  Pain Scale 0-10  Pain Score 0  Escalate  MEWS: Escalate Yellow: discuss with charge nurse/RN and consider discussing with provider and RRT  Notify: Provider  Provider Name/Title Chotiner, MD  Date Provider Notified 02/21/21  Time Provider Notified 2104  Notification Type Page (secure chat)  Notification Reason Other (Comment) (holding BP meds)  Provider response See new orders  Date of Provider Response 02/21/21  Time of Provider Response 2112  Document  Patient Outcome Stabilized after interventions  Progress note created (see row info) Yes   Patient MEWS yellow due to RR of 22 and BP of 90/57.  Patient with several BP lowering medications scheduled. MD notified of BP and holding of medications. Orders placed for 500cc bolus. BP after bolus is 109/67. Yellow MEWS protocol initiated.   Patient with no documented urine output and when asked about it patient said it had been "a while" since he remembered urinating. Bladder scan shows 579ml-in and out retrieved 1362ml. MD notified and requested additional bladder scan in four hours.

## 2021-02-22 NOTE — TOC Initial Note (Signed)
Transition of Care [facility]) - Initial/Assessment Note    Patient Details  Name: Roy [name] MRN: [id number] Date of Birth: 01/11/1936  [facility]) CM/SW Contact:    Tresa Endo Phone Number: 02/22/2021, 4:04 PM  Clinical Narrative:                 CSW received SNF consult. CSW met with pt at bedside, CSW introduced self and explained role at the hospital. Pt reports that PTA the pt came from home. PT reports pt independent ADLs. PT reports pt lives home alone and wants to get back to baseline, pt has a history of falls.   CSW reviewed PT/OT recommendations for SNF. Pt reports being agreeable to SNF to gain independence. Pt gave CSW permission to fax out to facilities in the area. Pt requested [facility] as preferred SNF. CSW gave pt medicare.gov rating list to review.  CSW contacted Juliann Pulse at [facility] to confirm bed for pt. Juliann Pulse stated she will have a bed over the weekend. CSW will follow up with pt expected DC date then start auth.  CSW will continue to follow.    Expected Discharge Plan: Skilled Nursing Facility Barriers to Discharge: Continued Medical Work up   Patient Goals and CMS Choice Patient states their goals for this hospitalization and ongoing recovery are:: Rehab CMS Medicare.gov Compare Post Acute Care list provided to:: Patient Choice offered to / list presented to : Patient  Expected Discharge Plan and Services Expected Discharge Plan: [place] In-house Referral: Clinical Social Work   Post Acute Care Choice: [place] Living arrangements for the past 2 months: [place]                                      Prior Living Arrangements/Services Living arrangements for the past 2 months: Single Family Home Lives with:: Self Patient language and need for interpreter reviewed:: Yes Do you feel safe going back to the place where you live?: Yes      Need for Family Participation in Patient Care: Yes  (Comment) Care giver support system in place?: Yes (comment)   Criminal Activity/Legal Involvement Pertinent to Current Situation/Hospitalization: No - Comment as needed  Activities of Daily Living      Permission Sought/Granted Permission sought to share information with : Family Supports, Customer service manager Permission granted to share information with : Yes, Verbal Permission Granted  Share Information with NAME: Dannel, Rafter (Son)   [phone]  Permission granted to share info w AGENCY: SNF  Permission granted to share info w Relationship: Zubair, Lofton (Son)   [phone]  Permission granted to share info w Contact Information: [place], [place])   [phone]  Emotional Assessment Appearance:: Appears stated age Attitude/Demeanor/Rapport: Engaged Affect (typically observed): Accepting, Appropriate Orientation: : Oriented to Place, Oriented to Self, Oriented to  Time, Oriented to Situation Alcohol / Substance Use: Not Applicable Psych Involvement: No (comment)  Admission diagnosis:  Syncope and collapse [R55] Dehydration [E86.0] Generalized weakness [R53.1] AKI (acute kidney injury) ([place]) [N17.9] Essential hypertension [I10] Fall, initial encounter [W19.XXXA] Contusion of forehead, initial encounter [S00.83XA] Diarrhea, unspecified type [R19.7] Patient Active Problem List   Diagnosis Date Noted   Syncope and collapse 02/20/2021   Hypokalemia 02/20/2021   Elevated LFTs 02/20/2021   Leukocytosis 02/20/2021   Acute renal failure superimposed on stage 3 chronic kidney disease ([place]) 02/20/2021   Chronic diastolic CHF (congestive  heart failure) ([place]) 02/20/2021   Pulmonary hypertension, unspecified ([place]) 12/17/2019   Persistent atrial fibrillation ([place]) 12/17/2019   Leg edema 11/18/2019   Exertional dyspnea 11/18/2019   Infected cyst of skin 01/25/2016   Abscess of external ear, left 01/11/2016   Acute deep vein thrombosis (DVT) of femoral vein of left  lower extremity ([place])    Bacteremia due to Enterococcus 10/09/2015   CKD (chronic kidney disease) stage 3, GFR 30-59 ml/min (HCC) 10/06/2015   Sepsis secondary to UTI ([place]) 10/06/2015   UTI (lower urinary tract infection) 10/06/2015   Benign prostatic hyperplasia with urinary obstruction 07/24/2015   DM2 (diabetes mellitus, type 2) ([place]) 01/06/2007   EXOGENOUS OBESITY 01/06/2007   OSA on CPAP 01/06/2007   Essential hypertension 01/06/2007   Chronic obstructive airway disease with asthma ([place]) 01/06/2007   PCP:  Rogers Blocker, MD Pharmacy:   [facility] [address] [address] Phone: [phone] Fax: [phone]     Social Determinants of Health (SDOH) Interventions    Readmission Risk Interventions No flowsheet data found.

## 2021-02-23 LAB — GLUCOSE, CAPILLARY
Glucose-Capillary: 106 mg/dL — ABNORMAL HIGH (ref 70–99)
Glucose-Capillary: 123 mg/dL — ABNORMAL HIGH (ref 70–99)
Glucose-Capillary: 118 mg/dL — ABNORMAL HIGH (ref 70–99)
Glucose-Capillary: 123 mg/dL — ABNORMAL HIGH (ref 70–99)

## 2021-02-23 LAB — CBC
HCT: 30.2 % — ABNORMAL LOW (ref 39.0–52.0)
Hemoglobin: 9.7 g/dL — ABNORMAL LOW (ref 13.0–17.0)
MCH: 30.1 pg (ref 26.0–34.0)
MCHC: 32.1 g/dL (ref 30.0–36.0)
MCV: 93.8 fL (ref 80.0–100.0)
Platelets: 147 10*3/uL — ABNORMAL LOW (ref 150–400)
RBC: 3.22 MIL/uL — ABNORMAL LOW (ref 4.22–5.81)
RDW: 15.7 % — ABNORMAL HIGH (ref 11.5–15.5)
WBC: 9 10*3/uL (ref 4.0–10.5)
nRBC: 0 % (ref 0.0–0.2)

## 2021-02-23 LAB — PROTEIN / CREATININE RATIO, URINE
Creatinine, Urine: 116.18 mg/dL
Protein Creatinine Ratio: 1.98 mg/mg{Cre} — ABNORMAL HIGH (ref 0.00–0.15)
Total Protein, Urine: 230 mg/dL

## 2021-02-23 LAB — BASIC METABOLIC PANEL
Anion gap: 6 (ref 5–15)
BUN: 62 mg/dL — ABNORMAL HIGH (ref 8–23)
CO2: 22 mmol/L (ref 22–32)
Calcium: 8.3 mg/dL — ABNORMAL LOW (ref 8.9–10.3)
Chloride: 109 mmol/L (ref 98–111)
Creatinine, Ser: 3.52 mg/dL — ABNORMAL HIGH (ref 0.61–1.24)
GFR, Estimated: 16 mL/min — ABNORMAL LOW (ref >60)
Glucose, Bld: 100 mg/dL — ABNORMAL HIGH (ref 70–99)
Potassium: 3.7 mmol/L (ref 3.5–5.1)
Sodium: 137 mmol/L (ref 135–145)

## 2021-02-23 MED ORDER — ALBUMIN HUMAN 25 % IV SOLN
25.0000 g | Freq: Four times a day (QID) | INTRAVENOUS | Status: AC
  Administered 2021-02-23 – 2021-02-24 (×3): 25 g via INTRAVENOUS
  Filled 2021-02-23 (×3): qty 100

## 2021-02-23 MED ORDER — SORBITOL 70 % SOLN
30.0000 mL | Freq: Once | Status: AC
  Administered 2021-02-23: 30 mL via ORAL
  Filled 2021-02-23: qty 30

## 2021-02-23 MED ORDER — SODIUM CHLORIDE 0.9 % IV SOLN: INTRAVENOUS | Status: DC

## 2021-02-23 MED ORDER — PANTOPRAZOLE SODIUM 40 MG PO TBEC
40.0000 mg | DELAYED_RELEASE_TABLET | Freq: Two times a day (BID) | ORAL | Status: DC
  Administered 2021-02-23 – 2021-02-26 (×7): 40 mg via ORAL
  Filled 2021-02-23 (×7): qty 1

## 2021-02-23 NOTE — Plan of Care (Signed)

## 2021-02-23 NOTE — TOC Progression Note (Signed)
Transition of Care [facility]) - Progression Note    Patient Details  Name: GRANGER CHUI MRN: [id number] Date of Birth: [birth year]  Transition of Care [facility]) CM/SW Contact  Reece Agar, [place] Phone Number: 02/23/2021, 1:16 PM  Clinical Narrative:    Juliann Pulse at [facility] followed up with CSW on pt DC status. CSW informed Juliann Pulse that pt would be 1-2 days. CSW informed MD that pt would need a covid the day before DC.   Expected Discharge Plan: [place] Barriers to Discharge: Continued Medical Work up  Expected Discharge Plan and Services Expected Discharge Plan: [place] In-house Referral: Clinical Social Work   Post Acute Care Choice: [place] Living arrangements for the past 2 months: Single Family Home                                       Social Determinants of Health (SDOH) Interventions    Readmission Risk Interventions No flowsheet data found.

## 2021-02-23 NOTE — Progress Notes (Signed)
Mobility Specialist Progress Note:   02/23/21 1600  Mobility  Activity Refused mobility   [facility] Phone [phone] Secondary Phone [phone]

## 2021-02-23 NOTE — Consult Note (Addendum)
[facility] Nephrology Consultation Note  Requesting MD: Dr. Tyrell Antonio Reason for consult: Acute kidney injury  HPI:  Roy [name] is a 85 y.o. male with history of CKDIIIb, BPH, anemia of chronic kidney disease, atrial fibrillation on Eliquis, type 2 diabetes mellitus, OSA on CPAP admitted to [facility] on 11/29 after being found down from a fall. Roy [name] reports he fell due to tripping over something in his house. Today he states he doesn't remember being sick or having and GI symptoms prior to this. He notes he's been eating and drinking well and has not noticed any changes in his urine. Roy [name] says he knows he has kidney disease, but is unsure why. He does mention that he sees Dr. Justin Mend with [facility]. He denies fevers, chills, chest pain, dizziness, dyspnea, dysuria.  PMHx:   Past Medical History:  Diagnosis Date   A-fib ([place])    Arthritis HANDS/ THUMBS/ SHOULDERS   Bladder calculi    Chronic obstructive asthma, unspecified FOLLOWED BY DR Tarri Fuller YOUNG  VISIT 10-14-2011 IN EPIC   COPD (chronic obstructive pulmonary disease) (HCC)    Cyst on ear    left   Diabetes mellitus    DVT (deep venous thrombosis) (HCC)    Dyspnea on exertion    GERD (gastroesophageal reflux disease)    Hematuria    History of prostate cancer 2010-- S/P  EXTERNAL RADIATION   Hydronephrosis, left    Hypertension    OSA on CPAP    wears CPAP   Sepsis ([place])    Wears glasses     Past Surgical History:  Procedure Laterality Date   CARPAL TUNNEL RELEASE  05-28-2005   LEFT   CYSTOSCOPY N/A 09/26/2012   Procedure: CYSTOSCOPY, clot evacuation,fulgeration of bladder, bladder biopsy, transurethreal vaporation of prostat;  Surgeon: Alexis Frock, MD;  Location: [facility];  Service: Urology;  Laterality: N/A;   CYSTOSCOPY W/ RETROGRADES Bilateral 10/14/2015   Procedure: CYSTOSCOPY WITH BILATERAL RETROGRADE PYELOGRAM/ FULGERATION WITH LITHOLAPAXY;  Surgeon: Festus Aloe, MD;  Location: [facility];  Service: Urology;  Laterality: Bilateral;   CYSTOSCOPY WITH LITHOLAPAXY  10/18/2011   Procedure: CYSTOSCOPY WITH LITHOLAPAXY;  Surgeon: Hanley Ben, MD;  Location: [place];  Service: Urology;  Laterality: N/A;  1 HOUR NO STENT H# [id number] CELL# [phone]  MEDICARE BCBS   EAR CYST EXCISION Left 02/23/2016   Procedure: EXCISION LEFT EAR CYST;  Surgeon: Izora Gala, MD;  Location: [place];  Service: ENT;  Laterality: Left;   GANGLION CYST EXCISION  1988  (APPROX)   [place]   TEE WITHOUT CARDIOVERSION N/A 10/11/2015   Procedure: TRANSESOPHAGEAL ECHOCARDIOGRAM (TEE);  Surgeon: Sanda Klein, MD;  Location: [facility] ENDOSCOPY;  Service: Cardiovascular;  Laterality: N/A;   Family Hx:  Family History  Problem Relation Age of Onset   Diabetes Father    Social History:  reports that he quit smoking about 22 years ago. His smoking use included cigarettes. He has a 12.50 pack-year smoking history. He has never used smokeless tobacco. He reports that he does not currently use alcohol. He reports that he does not use drugs.  Allergies: No Known Allergies  Medications: Prior to Admission medications   Medication Sig Start Date End Date Taking? Authorizing Provider  cinacalcet (SENSIPAR) 30 MG tablet Take 30 mg by mouth daily. 06/06/19  Yes [provider]  CONTOUR NEXT TEST test strip daily. 12/10/19  Yes [provider]  diltiazem (CARDIZEM CD) 240 MG 24 hr capsule Take 240 mg by mouth daily.   Yes [provider]  ELIQUIS 2.5 MG TABS tablet Take 2.5 mg by mouth 2 (two) times daily. 04/14/20  Yes [provider]  finasteride (PROSCAR) 5 MG tablet Take 5 mg by mouth daily.   Yes [provider]  Fluticasone-Umeclidin-Vilant (TRELEGY ELLIPTA) 100-62.5-25 MCG/INH AEPB Inhale 1 puff into the lungs daily. 02/29/20  Yes Young, Tarri Fuller D, MD  gabapentin (NEURONTIN) 100 MG capsule Take  100 mg by mouth daily.   Yes [provider]  hydrALAZINE (APRESOLINE) 50 MG tablet TAKE 1 TABLET BY MOUTH THREE TIMES DAILY Patient taking differently: Take 50 mg by mouth 3 (three) times daily. 01/02/21  Yes Patwardhan, Manish J, MD  isosorbide dinitrate (ISORDIL) 30 MG tablet TAKE 1 TABLET BY MOUTH THREE TIMES DAILY Patient taking differently: Take 30 mg by mouth 3 (three) times daily. 01/02/21  Yes Patwardhan, Manish J, MD  labetalol (NORMODYNE) 100 MG tablet Take 1 tablet (100 mg total) by mouth 2 (two) times daily. 06/21/20  Yes Patwardhan, Manish J, MD  pantoprazole (PROTONIX) 40 MG tablet Take 40 mg by mouth daily.   Yes [provider]  potassium chloride SA (K-DUR,KLOR-CON) 20 MEQ tablet Take 20 mEq by mouth daily.   Yes [provider]  tamsulosin (FLOMAX) 0.4 MG CAPS capsule Take 1 capsule (0.4 mg total) by mouth daily. 10/18/15  Yes Dana Allan I, MD  torsemide (DEMADEX) 100 MG tablet Take 100 mg by mouth daily.   Yes [provider]  zaleplon (SONATA) 5 MG capsule Take 1 capsule (5 mg total) by mouth at bedtime as needed for sleep. 08/29/20  Yes Young, Tarri Fuller D, MD  methenamine (HIPREX) 1 g tablet Take 500 mg by mouth 2 (two) times daily with a meal. 04/29/18   [provider]  Microlet Lancets [place]  12/10/19   [provider]    I have reviewed the patient's current medications.  Labs:  Results for orders placed or performed during the hospital encounter of 02/20/21 (from the past 48 hour(s))  CBG monitoring, ED     Status: Abnormal   Collection Time: 02/21/21 12:44 PM  Result Value Ref Range   Glucose-Capillary 142 (H) 70 - 99 mg/dL    Comment: Glucose reference range applies only to samples taken after fasting for at least 8 hours.  Glucose, capillary     Status: Abnormal   Collection Time: 02/21/21  4:28 PM  Result Value Ref Range   Glucose-Capillary 120 (H) 70 - 99 mg/dL    Comment: Glucose reference range applies  only to samples taken after fasting for at least 8 hours.  Glucose, capillary     Status: Abnormal   Collection Time: 02/21/21  8:51 PM  Result Value Ref Range   Glucose-Capillary 171 (H) 70 - 99 mg/dL    Comment: Glucose reference range applies only to samples taken after fasting for at least 8 hours.  Urinalysis, Routine w reflex microscopic Urine, In & Out Cath     Status: Abnormal   Collection Time: 02/21/21 11:28 PM  Result Value Ref Range   Color, Urine YELLOW YELLOW   APPearance CLOUDY (A) CLEAR   Specific Gravity, Urine 1.020 1.005 - 1.030   pH 6.0 5.0 - 8.0   Glucose, UA NEGATIVE NEGATIVE mg/dL   Hgb urine dipstick MODERATE (A) NEGATIVE   Bilirubin Urine NEGATIVE NEGATIVE   Ketones, ur NEGATIVE NEGATIVE mg/dL  Protein, ur >300 (A) NEGATIVE mg/dL   Nitrite NEGATIVE NEGATIVE   Leukocytes,Ua TRACE (A) NEGATIVE    Comment: Performed at [facility], [place] [address], [place], [place] [address]  Urinalysis, Microscopic (reflex)     Status: Abnormal   Collection Time: 02/21/21 11:28 PM  Result Value Ref Range   RBC / HPF 0-5 0 - 5 RBC/hpf   WBC, UA 6-10 0 - 5 WBC/hpf   Bacteria, UA RARE (A) NONE SEEN   Squamous Epithelial / LPF 0-5 0 - 5   Mucus PRESENT     Comment: Performed at [facility], [place] [address] [place], [place] [address]  Basic metabolic panel     Status: Abnormal   Collection Time: 02/22/21 12:45 AM  Result Value Ref Range   Sodium 137 135 - 145 mmol/L   Potassium 3.6 3.5 - 5.1 mmol/L   Chloride 106 98 - 111 mmol/L   CO2 22 22 - 32 mmol/L   Glucose, Bld 159 (H) 70 - 99 mg/dL    Comment: Glucose reference range applies only to samples taken after fasting for at least 8 hours.   BUN 63 (H) 8 - 23 mg/dL   Creatinine, Ser 3.17 (H) 0.61 - 1.24 mg/dL   Calcium 8.7 (L) 8.9 - 10.3 mg/dL   GFR, Estimated 19 (L) >60 mL/min    Comment: (NOTE) Calculated using the CKD-EPI Creatinine Equation (2021)    Anion gap 9 5 - 15    Comment: Performed at [facility] [address] [place], [place] [address]  CBC     Status: Abnormal   Collection Time: 02/22/21 12:45 AM  Result Value Ref Range   WBC 10.5 4.0 - 10.5 K/uL   RBC 3.41 (L) 4.22 - 5.81 MIL/uL   Hemoglobin 10.3 (L) 13.0 - 17.0 g/dL   HCT 32.7 (L) 39.0 - 52.0 %   MCV 95.9 80.0 - 100.0 fL   MCH 30.2 26.0 - 34.0 pg   MCHC 31.5 30.0 - 36.0 g/dL   RDW 15.7 (H) 11.5 - 15.5 %   Platelets 154 150 - 400 K/uL    Comment: REPEATED TO VERIFY   nRBC 0.2 0.0 - 0.2 %    Comment: Performed at [facility], [place] [address], [place], [place] [address]  Glucose, capillary     Status: Abnormal   Collection Time: 02/22/21  6:07 AM  Result Value Ref Range   Glucose-Capillary 122 (H) 70 - 99 mg/dL    Comment: Glucose reference range applies only to samples taken after fasting for at least 8 hours.   Comment 1 Notify RN    Comment 2 Document in Chart   Glucose, capillary     Status: Abnormal   Collection Time: 02/22/21 12:00 PM  Result Value Ref Range   Glucose-Capillary 114 (H) 70 - 99 mg/dL    Comment: Glucose reference range applies only to samples taken after fasting for at least 8 hours.  Glucose, capillary     Status: Abnormal   Collection Time: 02/22/21  4:15 PM  Result Value Ref Range   Glucose-Capillary 105 (H) 70 - 99 mg/dL    Comment: Glucose reference range applies only to samples taken after fasting for at least 8 hours.  Glucose, capillary     Status: Abnormal   Collection Time: 02/22/21  8:56 PM  Result Value Ref Range   Glucose-Capillary 122 (H) 70 - 99 mg/dL    Comment:  Glucose reference range applies only to samples taken after fasting for at least 8 hours.  CBC     Status: Abnormal   Collection Time: 02/23/21 12:27 AM  Result Value Ref Range   WBC 9.0 4.0 - 10.5 K/uL   RBC 3.22 (L) 4.22 - 5.81 MIL/uL   Hemoglobin 9.7 (L) 13.0 - 17.0 g/dL   HCT 30.2 (L) 39.0 - 52.0 %   MCV 93.8 80.0 - 100.0 fL   MCH 30.1 26.0 - 34.0 pg   MCHC 32.1 30.0 - 36.0 g/dL   RDW 15.7  (H) 11.5 - 15.5 %   Platelets 147 (L) 150 - 400 K/uL   nRBC 0.0 0.0 - 0.2 %    Comment: Performed at [facility] [address] [place], [place] [address]  Basic metabolic panel     Status: Abnormal   Collection Time: 02/23/21 12:27 AM  Result Value Ref Range   Sodium 137 135 - 145 mmol/L   Potassium 3.7 3.5 - 5.1 mmol/L   Chloride 109 98 - 111 mmol/L   CO2 22 22 - 32 mmol/L   Glucose, Bld 100 (H) 70 - 99 mg/dL    Comment: Glucose reference range applies only to samples taken after fasting for at least 8 hours.   BUN 62 (H) 8 - 23 mg/dL   Creatinine, Ser 3.52 (H) 0.61 - 1.24 mg/dL   Calcium 8.3 (L) 8.9 - 10.3 mg/dL   GFR, Estimated 16 (L) >60 mL/min    Comment: (NOTE) Calculated using the CKD-EPI Creatinine Equation (2021)    Anion gap 6 5 - 15    Comment: Performed at [facility] [address] [place], [place] [address]  Glucose, capillary     Status: Abnormal   Collection Time: 02/23/21  6:29 AM  Result Value Ref Range   Glucose-Capillary 106 (H) 70 - 99 mg/dL    Comment: Glucose reference range applies only to samples taken after fasting for at least 8 hours.     ROS:  Pertinent items are noted in HPI.  Physical Exam: Vitals:   02/23/21 0334 02/23/21 0721  BP: 129/75   Pulse: 66 62  Resp: 16 14  Temp: 97.8 F (36.6 C)   SpO2: 98% 98%     General exam: Appears calm and comfortable  Respiratory system: Clear to auscultation. Respiratory effort normal on room ait. No wheezing or crackle Cardiovascular system: S1 & S2 heard, RRR.  2+ pitting edema bilaterally Gastrointestinal system: Abdomen is nondistended, soft and nontender. Normal bowel sounds heard. Central nervous system: Alert and oriented. No focal neurological deficits. Extremities: Symmetric 5 x 5 power. Skin: No rashes, lesions or ulcers Psychiatry: Mood & affect appropriate. Poor insight to medical condition.  Assessment/Plan:  #Acute on chronic renal failure #Proteinuria - On  arrival Cr 3.08 (last available baseline creatinine 1.8 in 10/2019) - Patient initially improved with fluids, although has developed urinary retention since then with worsening renal function (Cr 3.52 today) - Foley catheter placed, patient has had 1.6L UOP since this time - On exam, patient has mild pitting edema in lower extremities although this appears chronic.  - Patient has been on normal saline infusion 47mL/hr for past three days - Renal ultrasound negative for hydronephrosis - With the fluid resuscitation we have been giving, can consider giving lasix. Wonder if patient has too much fluid causing mild cardiorenal syndrome with underlying diastolic heart failure - UA with significant proteinuria, will obtain protein/creatinine ratio for  further evaluation - Continue Foley catheter, will need follow-up with urology - Continue strict I/O, daily weights - Avoid nephrotoxic agents  #Anemia of chronic kidney disease - On arrival Hgb 12.7, today Hgb 9.7. No signs of bleeding on exam, most likely dilutional effect with continued fluids. - Has weekly injections of retacrit, appears last dose 11/22 - Will give next dose Retacrit  #Recurrent falls vs syncope - Patient with normal vital signs, no new findings on Echo - Management per primary team  #Persistent atrial fibrillation - On Cardizem and Eliquis, management per primary  #Chronic diastolic heart failure - Management per primary team  #Type 2 diabetes mellitus - On sliding scale insulin, management per primary  Sanjuan Dame, MD Internal Medicine Resident PGY-2 02/23/2021, 9:44 AM

## 2021-02-23 NOTE — Progress Notes (Signed)
PROGRESS NOTE    Roy [name]  UMP:[id number] DOB: 1935-06-16 DOA: 02/20/2021 PCP: Rogers Blocker, MD   Brief Narrative: 85 year old with past medical history significant for chronic A. fib on Eliquis, hypertension, diabetes type 2, OSA on CPAP, chronic obstructive airway disease with asthma, BPH, anemia of chronic kidney disease with weekly injection who presents to the ED after a fall sometimes the night prior to admission.  He was found by his son the day of admission.  Unknown for how long patient was down, unclear if syncope versus mechanical fall. Patient had a gastrointestinal illness with diarrhea nausea vomiting that is started the day prior to admission.  Poor oral intake. Patient was found to have leukocytosis white blood cell 15, hemoglobin 12, potassium 3.0, creatinine 3.0, mild transaminases, CT head right supraorbital and right frontal scalp hematoma without underlying skull fracture or foreign body.  CT cervical spine no acute fracture.  Patient admitted for recurrent fall questionable syncope, AKI.  Assessment & Plan:   Principal Problem:   Syncope and collapse Active Problems:   DM2 (diabetes mellitus, type 2) (HCC)   OSA on CPAP   Essential hypertension   Chronic obstructive airway disease with asthma (HCC)   Persistent atrial fibrillation (HCC)   Benign prostatic hyperplasia with urinary obstruction   Hypokalemia   Elevated LFTs   Leukocytosis   Acute renal failure superimposed on stage 3 chronic kidney disease (HCC)   Chronic diastolic CHF (congestive heart failure) ([place])   1-Recurrent Fall, questionable Syncope: -Orthostatic vitals negative.  -Treated with IV fluid -Echo; normal ejection fraction, moderate pericardial effusion resolved -PT OT consulted -Suspect related to dehydration,  related to recent GI illness.  Hypotensive overnight.  Hydralazine discontinue.   2-AKI superimposed on CKD stage IIIB, hematuria.  Creatinine 1 year ago 1.8 Suspect  related to hypovolemia, hemodynamic and urine retention.  Received  IV fluids. Strict I's and O's He will probably  need to be discharge with foley.  Renal US. Negative for hydronephrosis.  Continue with IV fluids.  Follow urine culture. Started IV ceftriaxone due to hematuria and pyuria./  No improvement in renal function. Nephrology consulted.   3-Hypokalemia: Replaced.  4-Transaminases: Resolved Hepatitis panel negative  5-Diarrhea right lower quadrant pain Resolved.   6 leukocytosis: Likely reactive Constipation; start sorbitol.   Gait instability, frequent fall: Check B12.  PT  Persistent A. fib: Rate controlled, continue with Eliquis and Cardizem  Chronic diastolic heart failure: Compensated.  Holding diuretics Hypertension: Continue with Cardizem and labetalol Diabetes type 2: Continue with sliding scale insulin COPD: Stable OSA on CPAP continue with CPAP     Estimated body mass index is 28.91 kg/m as calculated from the following:   Height as of this encounter: 5\' 10"  (1.778 m).   Weight as of this encounter: 91.4 kg.   DVT prophylaxis: Eliquis Code Status: Full code Family Communication: Son updated 12/01 Disposition Plan:  Status is: Observation  The patient remains OBS appropriate and will d/c before 2 midnights.       Consultants:  none  Procedures:  ECHO  Antimicrobials:    Subjective: He is feeling weak. Report constipation./  Indigestions. Started PPI.   Objective: Vitals:   02/23/21 0334 02/23/21 0721 02/23/21 1006 02/23/21 1217  BP: 129/75   130/83  Pulse: 66 62 67 60  Resp: 16 14  18   Temp: 97.8 F (36.6 C)   (!) 97.3 F (36.3 C)  TempSrc:    Oral  SpO2: 98% 98%  100%  Weight: 91.4 kg     Height:        Intake/Output Summary (Last 24 hours) at 02/23/2021 1238 Last data filed at 02/23/2021 1001 Gross per 24 hour  Intake 2164.42 ml  Output 1275 ml  Net 889.42 ml    Filed Weights   02/21/21 1338 02/22/21 0534  02/23/21 0334  Weight: 91 kg 92.1 kg 91.4 kg    Examination:  General exam: NAD Respiratory system: CTA Cardiovascular system: S 1, S 2 RRR Gastrointestinal system: BS present, soft, nt Central nervous system: Alert, follows command Extremities: symmetric power   Data Reviewed: I have personally reviewed following labs and imaging studies  CBC: Recent Labs  Lab 02/20/21 1312 02/21/21 0326 02/22/21 0045 02/23/21 0027  WBC 15.1* 11.0* 10.5 9.0  HGB 12.7* 11.9* 10.3* 9.7*  HCT 39.5 36.0* 32.7* 30.2*  MCV 92.7 92.5 95.9 93.8  PLT 188 161 154 147*    Basic Metabolic Panel: Recent Labs  Lab 02/20/21 1312 02/21/21 0326 02/22/21 0045 02/23/21 0027  NA 139 138 137 137  K 3.0* 2.8* 3.6 3.7  CL 103 106 106 109  CO2 26 24 22 22   GLUCOSE 134* 100* 159* 100*  BUN 66* 61* 63* 62*  CREATININE 3.08* 2.88* 3.17* 3.52*  CALCIUM 9.5 9.0 8.7* 8.3*  MG 1.7  --   --   --     GFR: Estimated Creatinine Clearance: 18.1 mL/min (A) (by C-G formula based on SCr of 3.52 mg/dL (H)). Liver Function Tests: Recent Labs  Lab 02/20/21 1312 02/21/21 0326  AST 59* 37  ALT 54* 42  ALKPHOS 87 70  BILITOT 1.4* 1.4*  PROT 5.1* 4.6*  ALBUMIN 2.6* 2.2*    No results for input(s): LIPASE, AMYLASE in the last 168 hours. No results for input(s): AMMONIA in the last 168 hours. Coagulation Profile: No results for input(s): INR, PROTIME in the last 168 hours. Cardiac Enzymes: Recent Labs  Lab 02/20/21 1312  CKTOTAL 496*    BNP (last 3 results) No results for input(s): PROBNP in the last 8760 hours. HbA1C: Recent Labs    02/20/21 1630  HGBA1C 5.5    CBG: Recent Labs  Lab 02/22/21 1200 02/22/21 1615 02/22/21 2056 02/23/21 0629 02/23/21 1219  GLUCAP 114* 105* 122* 106* 123*    Lipid Profile: No results for input(s): CHOL, HDL, LDLCALC, TRIG, CHOLHDL, LDLDIRECT in the last 72 hours. Thyroid Function Tests: Recent Labs    02/20/21 1750  TSH 0.723    Anemia  Panel: Recent Labs    02/21/21 0800  VITAMINB12 406    Sepsis Labs: No results for input(s): PROCALCITON, LATICACIDVEN in the last 168 hours.  Recent Results (from the past 240 hour(s))  Resp Panel by RT-PCR (Flu A&B, Covid) Nasopharyngeal Swab     Status: None   Collection Time: 02/20/21  3:14 PM   Specimen: Nasopharyngeal Swab; Nasopharyngeal(NP) swabs in vial transport medium  Result Value Ref Range Status   SARS Coronavirus 2 by RT PCR NEGATIVE NEGATIVE Final    Comment: (NOTE) SARS-CoV-2 target nucleic acids are NOT DETECTED.  The SARS-CoV-2 RNA is generally detectable in upper respiratory specimens during the acute phase of infection. The lowest concentration of SARS-CoV-2 viral copies this assay can detect is 138 copies/mL. A negative result does not preclude SARS-Cov-2 infection and should not be used as the sole basis for treatment or other patient management decisions. A negative result may occur with  improper specimen collection/handling, submission of specimen other than  nasopharyngeal swab, presence of viral mutation(s) within the areas targeted by this assay, and inadequate number of viral copies(<138 copies/mL). A negative result must be combined with clinical observations, patient history, and epidemiological information. The expected result is Negative.  Fact Sheet for Patients:  EntrepreneurPulse.com.au  Fact Sheet for Healthcare Providers:  IncredibleEmployment.be  This test is no t yet approved or cleared by the [place] FDA and  has been authorized for detection and/or diagnosis of SARS-CoV-2 by FDA under an Emergency Use Authorization (EUA). This EUA will remain  in effect (meaning this test can be used) for the duration of the COVID-19 declaration under Section 564(b)(1) of the Act, 21 U.S.C.section 360bbb-3(b)(1), unless the authorization is terminated  or revoked sooner.       Influenza A by PCR NEGATIVE  NEGATIVE Final   Influenza B by PCR NEGATIVE NEGATIVE Final    Comment: (NOTE) The Xpert Xpress SARS-CoV-2/FLU/RSV plus assay is intended as an aid in the diagnosis of influenza from Nasopharyngeal swab specimens and should not be used as a sole basis for treatment. Nasal washings and aspirates are unacceptable for Xpert Xpress SARS-CoV-2/FLU/RSV testing.  Fact Sheet for Patients: EntrepreneurPulse.com.au  Fact Sheet for Healthcare Providers: IncredibleEmployment.be  This test is not yet approved or cleared by the [place] FDA and has been authorized for detection and/or diagnosis of SARS-CoV-2 by FDA under an Emergency Use Authorization (EUA). This EUA will remain in effect (meaning this test can be used) for the duration of the COVID-19 declaration under Section 564(b)(1) of the Act, 21 U.S.C. section 360bbb-3(b)(1), unless the authorization is terminated or revoked.  Performed at [facility], [place] [address], [place], [place] [address]   Urine Culture     Status: None (Preliminary result)   Collection Time: 02/22/21 10:41 AM   Specimen: Urine, Clean Catch  Result Value Ref Range Status   Specimen Description URINE, CLEAN CATCH  Final   Special Requests NONE  Final   Culture   Final    CULTURE REINCUBATED FOR BETTER GROWTH Performed at [facility], [place] [address], [place], [place] [address]    Report Status PENDING  Incomplete          Radiology Studies: US RENAL  Result Date: 02/22/2021 CLINICAL DATA:  Acute kidney injury, history COPD, diabetes mellitus, hypertension EXAM: RENAL / URINARY TRACT ULTRASOUND COMPLETE COMPARISON:  03/02/2019 FINDINGS: Right Kidney: Renal measurements: 13.5 x 6.5 x 7.6 cm (volume = 350 cm^3). Normal cortical thickness. Increased cortical echogenicity. Multiple cysts, largest 4.6 x 4.3 x 3.9 cm containing a partial septation. Greater than 10 cysts present. No hydronephrosis or shadowing  calcification. Left Kidney: Renal measurements: 13.1 x 5.7 x 4.9 cm (volume = 190 cm^3). Normal cortical thickness. Increased cortical echogenicity. Numerous cysts, greater than 10. Largest cyst located at medial kidney 5.1 x 4.3 x 5.8 cm. Additional mid renal cyst 4.1 x 4.3 x 4.5 cm. No solid mass or hydronephrosis. Bladder: Decompressed by Foley catheter. Bladder wall appears thickened but this may be an artifact related underdistention. Other: N/A IMPRESSION: Medical renal disease changes of both kidneys. Multiple BILATERAL renal cysts without hydronephrosis. Electronically Signed   By: Lavonia Dana M.D.   On: 02/22/2021 12:09        Scheduled Meds:  apixaban  2.5 mg Oral BID   Chlorhexidine Gluconate Cloth  6 each Topical Daily   cinacalcet  30 mg Oral Daily   diltiazem  240 mg Oral Daily   finasteride  5 mg Oral Daily   fluticasone furoate-vilanterol  1 puff Inhalation Daily   insulin aspart  0-9 Units Subcutaneous TID WC   isosorbide dinitrate  30 mg Oral TID   labetalol  100 mg Oral BID   pantoprazole  40 mg Oral BID   potassium chloride SA  20 mEq Oral Daily   sodium chloride flush  3 mL Intravenous Q12H   tamsulosin  0.4 mg Oral Daily   umeclidinium bromide  1 puff Inhalation Daily   Continuous Infusions:  sodium chloride 75 mL/hr at 02/23/21 1040   cefTRIAXone (ROCEPHIN)  IV Stopped (02/23/21 1036)     LOS: 1 day    Time spent: 35 minutes    Evah Rashid A Caden Fukushima, MD [facility]   If 7PM-7AM, please contact night-coverage [email or web address]  02/23/2021, 12:38 PM

## 2021-02-23 NOTE — Progress Notes (Signed)
Physical Therapy Treatment Patient Details Name: Roy [name] MRN: [id number] DOB: [birth year] Today's Date: 02/23/2021   History of Present Illness Pt presented to ED 02/20/21 with weakness and multiple falls after vomiting/diarrhea. PMH - afib, arthritis, copd, DM, DVT, prostate CA, HTN, gait instability    PT Comments    Pt is making good progress with mobility, ambulating up to ~80 ft with a rollator and min guard-minA this date. However, he remains at high risk for subsequent falls as he displays deficits in balance, taking rapid steps spontaneously and displaying decreased rollator control during those moments. Pt also requiring multiple attempts to come to stand, needing step-by-step cues to set up from transfers efficiently, indicating decreased lower extremity strength and power. If pt can get more frequent assistance at home then pt could go home with HHPT. However, if not, then he would benefit from a short-term rehab stay to maximize his safety and independence with all functional mobility. Will continue to follow acutely.   Recommendations for follow up therapy are one component of a multi-disciplinary discharge planning process, led by the attending physician.  Recommendations may be updated based on patient status, additional functional criteria and insurance authorization.  Follow Up Recommendations  Skilled nursing-short term rehab (<3 hours/day) (could go home with HHPT if had more assistance)     Assistance Recommended at Discharge Frequent or constant Supervision/Assistance  Equipment Recommendations  Rolling walker (2 wheels) (if he can't locate his)    Recommendations for Other Services       Precautions / Restrictions Precautions Precautions: Fall Restrictions Weight Bearing Restrictions: No     Mobility  Bed Mobility Overal bed mobility: Needs Assistance Bed Mobility: Supine to Sit     Supine to sit: HOB elevated;Min assist     General bed mobility  comments: Pt needing extra time, good initiation of legs off EOB and trying to ascend trunk but needing minA to complete trunk ascension and scoot to EOB.    Transfers Overall transfer level: Needs assistance Equipment used: Rollator (4 wheels) Transfers: Sit to/from Stand Sit to Stand: Min guard;From elevated surface           General transfer comment: Increased time and multiple attempts needed to be successful in coming to stand from elevated EOB, cuing pt to scoot to edge, push up from bed, apply rollator brakes, and obtain anterior trunk momentum through rocking. Min guard for safety.    Ambulation/Gait Ambulation/Gait assistance: Min guard;Min assist Gait Distance (Feet): 80 Feet Assistive device: Rollator (4 wheels) Gait Pattern/deviations: Step-through pattern;Decreased stride length;Shuffle;Trunk flexed Gait velocity: reduced Gait velocity interpretation: <1.8 ft/sec, indicate of risk for recurrent falls   General Gait Details: Pt cued to remain proximal to rollator, but pt with intermittent quick steps needing cues to slow and control rollator at times, min guard-minA to maintain balance and safety. Mod cuing throughout. Tactile and verbal cues provided to adduct scapulas and depress shoulders for improved posture.   Stairs             Wheelchair Mobility    Modified Rankin (Stroke Patients Only)       Balance Overall balance assessment: Needs assistance Sitting-balance support: No upper extremity supported;Feet supported Sitting balance-Leahy Scale: Fair Sitting balance - Comments: Static sitting EOB with supervision for safety.   Standing balance support: Reliant on assistive device for balance Standing balance-Leahy Scale: Poor Standing balance comment: reliant on Bil UE support  Cognition Arousal/Alertness: Awake/alert Behavior During Therapy: WFL for tasks assessed/performed;Flat affect Overall Cognitive  Status: Within Functional Limits for tasks assessed                                 General Comments: Noted slurred speech which pt reports is his normal especially when he first wakes up        Exercises General Exercises - Lower Extremity Quad Sets: Strengthening;Both;Other reps (comment);Supine (x1 rep to educate pt on exercise for HEP) Long Arc Quad: Strengthening;Both;5 reps;Seated Straight Leg Raises: AAROM;Both;Other reps (comment);Supine (x2 reps to educate pt on exercise for HEP) Hip Flexion/Marching: Strengthening;Both;Other reps (comment);Seated (x1 rep to educate pt on exercise for HEP)    General Comments General comments (skin integrity, edema, etc.): Educated pt to perform ankle pumps and elevate legs to manage edema; educated pt to perform SLR and quad sets when supine and LAQ and marching in sitting      Pertinent Vitals/Pain Pain Assessment: No/denies pain    Home Living                          Prior Function            PT Goals (current goals can now be found in the care plan section) Acute Rehab PT Goals Patient Stated Goal: to improve PT Goal Formulation: With patient Time For Goal Achievement: 03/07/21 Potential to Achieve Goals: Good Progress towards PT goals: Progressing toward goals    Frequency    Min 3X/week      PT Plan Current plan remains appropriate    Co-evaluation              AM-PAC PT "6 Clicks" Mobility   Outcome Measure  Help needed turning from your back to your side while in a flat bed without using bedrails?: A Little Help needed moving from lying on your back to sitting on the side of a flat bed without using bedrails?: A Little Help needed moving to and from a bed to a chair (including a wheelchair)?: A Little Help needed standing up from a chair using your arms (e.g., wheelchair or bedside chair)?: A Little Help needed to walk in hospital room?: A Lot (mod cues) Help needed climbing 3-5  steps with a railing? : Total 6 Click Score: 15    End of Session Equipment Utilized During Treatment: Gait belt Activity Tolerance: Patient tolerated treatment well Patient left: in chair;with call bell/phone within reach;with chair alarm set Nurse Communication: Mobility status (slurred speech with pt reporting this is his normal) PT Visit Diagnosis: Unsteadiness on feet (R26.81);Other abnormalities of gait and mobility (R26.89);Repeated falls (R29.6);Muscle weakness (generalized) (M62.81);Difficulty in walking, not elsewhere classified (R26.2)     Time: 1107-1130 PT Time Calculation (min) (ACUTE ONLY): 23 min  Charges:  $Gait Training: 8-22 mins $Therapeutic Activity: 8-22 mins                     Moishe Spice, PT, DPT Acute Rehabilitation Services  Pager: [phone] Office: [place] 02/23/2021, 11:43 AM

## 2021-02-23 NOTE — Social Work (Signed)
[employer] auth request submitted, ref # [id number]. EDD 12/4 pending auth. Will need COVID test prior to dc.   Wandra Feinstein, MSW, LCSW [phone] (coverage)

## 2021-02-24 LAB — RENAL FUNCTION PANEL
Albumin: 2.8 g/dL — ABNORMAL LOW (ref 3.5–5.0)
Anion gap: 9 (ref 5–15)
BUN: 57 mg/dL — ABNORMAL HIGH (ref 8–23)
CO2: 22 mmol/L (ref 22–32)
Calcium: 8.5 mg/dL — ABNORMAL LOW (ref 8.9–10.3)
Chloride: 107 mmol/L (ref 98–111)
Creatinine, Ser: 3.39 mg/dL — ABNORMAL HIGH (ref 0.61–1.24)
GFR, Estimated: 17 mL/min — ABNORMAL LOW (ref >60)
Glucose, Bld: 96 mg/dL (ref 70–99)
Phosphorus: 2.6 mg/dL (ref 2.5–4.6)
Potassium: 3.7 mmol/L (ref 3.5–5.1)
Sodium: 138 mmol/L (ref 135–145)

## 2021-02-24 LAB — CBC
HCT: 29.5 % — ABNORMAL LOW (ref 39.0–52.0)
Hemoglobin: 9.4 g/dL — ABNORMAL LOW (ref 13.0–17.0)
MCH: 29.7 pg (ref 26.0–34.0)
MCHC: 31.9 g/dL (ref 30.0–36.0)
MCV: 93.4 fL (ref 80.0–100.0)
Platelets: 139 10*3/uL — ABNORMAL LOW (ref 150–400)
RBC: 3.16 MIL/uL — ABNORMAL LOW (ref 4.22–5.81)
RDW: 15.6 % — ABNORMAL HIGH (ref 11.5–15.5)
WBC: 7 10*3/uL (ref 4.0–10.5)
nRBC: 0 % (ref 0.0–0.2)

## 2021-02-24 LAB — IRON AND TIBC
Iron: 34 ug/dL — ABNORMAL LOW (ref 45–182)
Saturation Ratios: 18 % (ref 17.9–39.5)
TIBC: 193 ug/dL — ABNORMAL LOW (ref 250–450)
UIBC: 159 ug/dL

## 2021-02-24 LAB — FERRITIN: Ferritin: 67 ng/mL (ref 24–336)

## 2021-02-24 LAB — GLUCOSE, CAPILLARY
Glucose-Capillary: 108 mg/dL — ABNORMAL HIGH (ref 70–99)
Glucose-Capillary: 119 mg/dL — ABNORMAL HIGH (ref 70–99)
Glucose-Capillary: 155 mg/dL — ABNORMAL HIGH (ref 70–99)
Glucose-Capillary: 118 mg/dL — ABNORMAL HIGH (ref 70–99)

## 2021-02-24 LAB — RESP PANEL BY RT-PCR (FLU A&B, COVID) ARPGX2
Influenza A by PCR: NEGATIVE
Influenza B by PCR: NEGATIVE
SARS Coronavirus 2 by RT PCR: NEGATIVE

## 2021-02-24 MED ORDER — SODIUM CHLORIDE 0.9 % IV SOLN: 750.0000 mg | Freq: Once | INTRAVENOUS | Status: DC

## 2021-02-24 MED ORDER — TORSEMIDE 100 MG PO TABS: 50.0000 mg | ORAL_TABLET | Freq: Every day | ORAL | 0 refills | Status: DC

## 2021-02-24 MED ORDER — DARBEPOETIN ALFA 60 MCG/0.3ML IJ SOSY
60.0000 ug | PREFILLED_SYRINGE | Freq: Once | INTRAMUSCULAR | Status: AC
  Administered 2021-02-24: 60 ug via SUBCUTANEOUS
  Filled 2021-02-24: qty 0.3

## 2021-02-24 MED ORDER — SODIUM CHLORIDE 0.9 % IV SOLN
250.0000 mg | Freq: Once | INTRAVENOUS | Status: AC
  Administered 2021-02-24: 250 mg via INTRAVENOUS
  Filled 2021-02-24: qty 20

## 2021-02-24 NOTE — Progress Notes (Signed)
[name] NEPHROLOGY PROGRESS NOTE  Assessment/ Plan: Pt is a 85 y.o. yo male   with history of hypertension, type 2 diabetes, OSA on CPAP, A. fib on Eliquis, COPD, asthma, BPH, CKD stage IV with baseline creatinine level seems to be around 2.8 followed by Dr. Justin Mend at [facility], presented to ED after of fall and possible syncopal episode.    # Acute kidney injury on CKD stage IV probably hemodynamically mediated in the setting of intravascular volume depletion/UTI and bladder outlet obstruction.  UA with chronic proteinuria.  Kidney ultrasound with chronic finding without any hydronephrosis.  He is not uremic and has no overt uremic symptoms.  Apparently he was last seen by Dr. Justin Mend on 11/3 when creatinine level was 2.8.  He was reluctant to go to dialysis or kidney option class at that time.  Decent urine output and creatinine level trending down.  No need for dialysis.  Strict ins and out and lab monitoring.  He can follow with Dr. Justin Mend after discharge.  I recommend to consult palliative care to address goals of care etc.   #Anemia of CKD: Hemoglobin was at goal and now dropping.  Iron saturation 18% therefore I will order IV iron and ESA.   He gets ESA as outpatient.   #Recurrent falls versus syncope: No orthostatic findings.  Echo reviewed.  Blood pressure acceptable.   #Chronic diastolic CHF: Some peripheral edema.  Discontinued IV fluid.  It seems like he was on torsemide 100 mg daily before admission.  Recommend to resume 50 mg when discharged from the hospital.  I will sign off, please call back with question.  Subjective: Seen and examined at bedside.  He denies nausea, vomiting, chest pain, shortness of breath.  He was able to lie flat.  Urine output was recorded around 775 cc. Objective Vital signs in last 24 hours: Vitals:   02/23/21 1951 02/23/21 1953 02/24/21 0352 02/24/21 0734  BP: (!) 125/98 136/74 (!) 144/75   Pulse: 68  72 62  Resp: 19  17 18   Temp:  97.8 F (36.6 C)  97.9 F (36.6 C)   TempSrc: Oral  Oral   SpO2: 100%  98% 99%  Weight:   94.4 kg   Height:       Weight change: 3 kg  Intake/Output Summary (Last 24 hours) at 02/24/2021 0745 Last data filed at 02/24/2021 0400 Gross per 24 hour  Intake 1646.45 ml  Output 775 ml  Net 871.45 ml       Labs: Basic Metabolic Panel: Recent Labs  Lab 02/22/21 0045 02/23/21 0027 02/24/21 0323  NA 137 137 138  K 3.6 3.7 3.7  CL 106 109 107  CO2 22 22 22   GLUCOSE 159* 100* 96  BUN 63* 62* 57*  CREATININE 3.17* 3.52* 3.39*  CALCIUM 8.7* 8.3* 8.5*  PHOS  --   --  2.6   Liver Function Tests: Recent Labs  Lab 02/20/21 1312 02/21/21 0326 02/24/21 0323  AST 59* 37  --   ALT 54* 42  --   ALKPHOS 87 70  --   BILITOT 1.4* 1.4*  --   PROT 5.1* 4.6*  --   ALBUMIN 2.6* 2.2* 2.8*   No results for input(s): LIPASE, AMYLASE in the last 168 hours. No results for input(s): AMMONIA in the last 168 hours. CBC: Recent Labs  Lab 02/20/21 1312 02/21/21 0326 02/22/21 0045 02/23/21 0027 02/24/21 0323  WBC 15.1* 11.0* 10.5 9.0 7.0  HGB 12.7* 11.9*  10.3* 9.7* 9.4*  HCT 39.5 36.0* 32.7* 30.2* 29.5*  MCV 92.7 92.5 95.9 93.8 93.4  PLT 188 161 154 147* 139*   Cardiac Enzymes: Recent Labs  Lab 02/20/21 1312  CKTOTAL 496*   CBG: Recent Labs  Lab 02/23/21 0629 02/23/21 1219 02/23/21 1546 02/23/21 2112 02/24/21 0607  GLUCAP 106* 123* 118* 123* 108*    Iron Studies:  Recent Labs    02/24/21 0323  IRON 34*  TIBC 193*  FERRITIN 67   Studies/Results: US RENAL  Result Date: 02/22/2021 CLINICAL DATA:  Acute kidney injury, history COPD, diabetes mellitus, hypertension EXAM: RENAL / URINARY TRACT ULTRASOUND COMPLETE COMPARISON:  03/02/2019 FINDINGS: Right Kidney: Renal measurements: 13.5 x 6.5 x 7.6 cm (volume = 350 cm^3). Normal cortical thickness. Increased cortical echogenicity. Multiple cysts, largest 4.6 x 4.3 x 3.9 cm containing a partial septation. Greater than 10  cysts present. No hydronephrosis or shadowing calcification. Left Kidney: Renal measurements: 13.1 x 5.7 x 4.9 cm (volume = 190 cm^3). Normal cortical thickness. Increased cortical echogenicity. Numerous cysts, greater than 10. Largest cyst located at medial kidney 5.1 x 4.3 x 5.8 cm. Additional mid renal cyst 4.1 x 4.3 x 4.5 cm. No solid mass or hydronephrosis. Bladder: Decompressed by Foley catheter. Bladder wall appears thickened but this may be an artifact related underdistention. Other: N/A IMPRESSION: Medical renal disease changes of both kidneys. Multiple BILATERAL renal cysts without hydronephrosis. Electronically Signed   By: Lavonia Dana M.D.   On: 02/22/2021 12:09    Medications: Infusions:  cefTRIAXone (ROCEPHIN)  IV Stopped (02/23/21 1036)    Scheduled Medications:  apixaban  2.5 mg Oral BID   Chlorhexidine Gluconate Cloth  6 each Topical Daily   cinacalcet  30 mg Oral Daily   diltiazem  240 mg Oral Daily   finasteride  5 mg Oral Daily   fluticasone furoate-vilanterol  1 puff Inhalation Daily   insulin aspart  0-9 Units Subcutaneous TID WC   isosorbide dinitrate  30 mg Oral TID   labetalol  100 mg Oral BID   pantoprazole  40 mg Oral BID   sodium chloride flush  3 mL Intravenous Q12H   tamsulosin  0.4 mg Oral Daily   umeclidinium bromide  1 puff Inhalation Daily    have reviewed scheduled and prn medications.  Physical Exam: General: Pleasant male, lying on bed comfortable.  Not in distress Heart:RRR, s1s2 nl Lungs:clear b/l, no crackle Abdomen:soft, Non-tender, non-distended Extremities: Trace leg edema stable Neurology: Alert, awake and following commands  Karolynn Infantino Prasad Oria Klimas 02/24/2021,7:45 AM  LOS: 2 days

## 2021-02-24 NOTE — TOC Progression Note (Addendum)
Transition of Care [facility]) - Progression Note    Patient Details  Name: Roy [name] MRN: [id number] Date of Birth: May 14, 1935  [facility] of [facility]) CM/SW [place], LCSW Phone Number: 02/24/2021, 8:52 AM  Clinical Narrative:    8:52am-Insurance authorization still pending for [employer]. CSW requesting rapid COVID test for patient.   CSW received request to speak with patient's son. CSW updated patient and he provided verbal permission to speak with his son. CSW spoke with [facility] and provided an updated on SNF.  1:19pm-Insurance still pending.    Expected Discharge Plan: [place] Barriers to Discharge: Continued Medical Work up  Expected Discharge Plan and Services Expected Discharge Plan: [place] In-house Referral: Clinical Social Work   Post Acute Care Choice: [place] Living arrangements for the past 2 months: Single Family Home Expected Discharge Date: 02/24/21                                     Social Determinants of Health (SDOH) Interventions    Readmission Risk Interventions No flowsheet data found.

## 2021-02-24 NOTE — Discharge Summary (Addendum)
Physician Discharge Summary  Roy [name]:[id number] DOB: May 30, 1935 DOA: 02/20/2021  PCP: Rogers Blocker, MD  Admit date: 02/20/2021 Discharge date: 02/24/2021  Admitted From: Home  Disposition:  SNF  Recommendations for Outpatient Follow-up:  Follow up with PCP in 1-2 weeks Please obtain BMP/CBC in one week He will need follow up with urology for voiding trial.  Palliative care consult for goals of care.    Discharge Condition: Stable.  CODE STATUS: DNR Diet recommendation: Heart Healthy   Brief/Interim Summary: 85 year old with past medical history significant for chronic A. fib on Eliquis, hypertension, diabetes type 2, OSA on CPAP, chronic obstructive airway disease with asthma, BPH, anemia of chronic kidney disease with weekly injection who presents to the ED after a fall sometimes the night prior to admission.  He was found by his son the day of admission.  Unknown for how long patient was down, unclear if syncope versus mechanical fall. Patient had a gastrointestinal illness with diarrhea nausea vomiting that is started the day prior to admission.  Poor oral intake. Patient was found to have leukocytosis white blood cell 15, hemoglobin 12, potassium 3.0, creatinine 3.0, mild transaminases, CT head right supraorbital and right frontal scalp hematoma without underlying skull fracture or foreign body.  CT cervical spine no acute fracture.   Patient admitted for recurrent fall questionable syncope, AKI.    1-Recurrent Fall, questionable Syncope: -Orthostatic vitals negative.  -Treated with IV fluid -Echo; normal ejection fraction, moderate pericardial effusion resolved -PT OT consulted -Suspect related to dehydration,  related to recent GI illness.  Hypotensive during hospitalization.   Hydralazine discontinue.  No further episodes.   2-AKI superimposed on CKD stage IIIB, hematuria.  Creatinine 1 year ago 1.8. Per nephrologist records prior Cr 2.8  Suspect related to  hypovolemia, hemodynamic and urine retention.  Received  IV fluids. He will probably  need to be discharge with foley.  Renal US. Negative for hydronephrosis.  Continue with IV fluids.  urine culture: no growth. Started IV ceftriaxone due to hematuria and pyuria./ completed 3 days.  Cr down close to baseline. Patient stable to transfer to SNF.  Patient confirm he would not want HD.  He would not want resuscitation. He wishes to be DNR. Family wants time to speak with patient.    3-Hypokalemia: Replaced.   4-Transaminases: Resolved Hepatitis panel negative   5-Diarrhea right lower quadrant pain Resolved.    6 Leukocytosis: Likely reactive Constipation; started  sorbitol.    Gait instability, frequent fall: B12 400.  PT   Persistent A. fib: Rate controlled, continue with Eliquis and Cardizem   Chronic diastolic heart failure: Compensated.  Resume Torsemide at discharge, 50 mg daily per nephrologist recommendation.  Hypertension: Continue with Cardizem and labetalol Diabetes type 2: Continue with sliding scale insulin COPD: Stable OSA on CPAP continue with CPAP        Discharge Diagnoses:  Principal Problem:   Syncope and collapse Active Problems:   DM2 (diabetes mellitus, type 2) (HCC)   OSA on CPAP   Essential hypertension   Chronic obstructive airway disease with asthma (HCC)   Persistent atrial fibrillation (HCC)   Benign prostatic hyperplasia with urinary obstruction   Hypokalemia   Elevated LFTs   Leukocytosis   Acute renal failure superimposed on stage 3 chronic kidney disease (HCC)   Chronic diastolic CHF (congestive heart failure) ([place])    Discharge Instructions  Discharge Instructions     Diet - low sodium heart healthy   Complete by:  As directed    Increase activity slowly   Complete by: As directed       Allergies as of 02/24/2021   No Known Allergies      Medication List     STOP taking these medications    hydrALAZINE 50 MG  tablet Commonly known as: APRESOLINE   methenamine 1 g tablet Commonly known as: HIPREX   potassium chloride SA 20 MEQ tablet Commonly known as: KLOR-CON M       TAKE these medications    cinacalcet 30 MG tablet Commonly known as: SENSIPAR Take 30 mg by mouth daily.   Contour Next Test test strip Generic drug: glucose blood daily.   diltiazem 240 MG 24 hr capsule Commonly known as: CARDIZEM CD Take 240 mg by mouth daily.   Eliquis 2.5 MG Tabs tablet Generic drug: apixaban Take 2.5 mg by mouth 2 (two) times daily.   finasteride 5 MG tablet Commonly known as: PROSCAR Take 5 mg by mouth daily.   gabapentin 100 MG capsule Commonly known as: NEURONTIN Take 100 mg by mouth daily.   isosorbide dinitrate 30 MG tablet Commonly known as: ISORDIL TAKE 1 TABLET BY MOUTH THREE TIMES DAILY   labetalol 100 MG tablet Commonly known as: NORMODYNE Take 1 tablet (100 mg total) by mouth 2 (two) times daily.   Microlet Lancets Misc   pantoprazole 40 MG tablet Commonly known as: PROTONIX Take 40 mg by mouth daily.   tamsulosin 0.4 MG Caps capsule Commonly known as: FLOMAX Take 1 capsule (0.4 mg total) by mouth daily.   torsemide 100 MG tablet Commonly known as: DEMADEX Take 0.5 tablets (50 mg total) by mouth daily. What changed: how much to take   Trelegy Ellipta 100-62.5-25 MCG/ACT Aepb Generic drug: Fluticasone-Umeclidin-Vilant Inhale 1 puff into the lungs daily.   zaleplon 5 MG capsule Commonly known as: Sonata Take 1 capsule (5 mg total) by mouth at bedtime as needed for sleep.        No Known Allergies  Consultations: Nephrology    Procedures/Studies: DG Facial Bones Complete  Result Date: 02/12/2021 CLINICAL DATA:  Fall several days ago with right eye bruising, initial encounter EXAM: FACIAL BONES COMPLETE 3+V COMPARISON:  None. FINDINGS: There is no evidence of fracture or other significant bone abnormality. No orbital emphysema or sinus air-fluid  levels are seen. IMPRESSION: No acute abnormality noted. Electronically Signed   By: Inez Catalina M.D.   On: 02/12/2021 20:54   CT HEAD WO CONTRAST (5MM)  Result Date: 02/20/2021 CLINICAL DATA:  [place] yesterday at home. Right frontal scalp hematoma. EXAM: CT HEAD WITHOUT CONTRAST TECHNIQUE: Contiguous axial images were obtained from the base of the skull through the vertex without intravenous contrast. COMPARISON:  10/06/2015 FINDINGS: Brain: Stable age related cerebral atrophy, ventriculomegaly and periventricular white matter disease. No extra-axial fluid collections are identified. No CT findings for acute hemispheric infarction or intracranial hemorrhage. No mass lesions. The brainstem and cerebellum are normal. Vascular: Stable vascular calcifications. No aneurysm or hyperdense vessels. Skull: No skull fracture or bone lesions. Sinuses/Orbits: Scattered ethmoid sinus disease. The mastoid air cells and middle ear cavities are clear. The globes are intact. Other: Right supraorbital and right frontal scalp hematoma without underlying skull fracture or radiopaque foreign body. IMPRESSION: 1. Right supraorbital and right frontal scalp hematoma without underlying skull fracture or radiopaque foreign body. 2. Stable age related cerebral atrophy, ventriculomegaly and periventricular white matter disease. No acute intracranial findings. Electronically Signed   By: Marijo Sanes  M.D.   On: 02/20/2021 15:11   CT Cervical Spine Wo Contrast  Result Date: 02/20/2021 CLINICAL DATA:  Trauma.  Status post fall. EXAM: CT CERVICAL SPINE WITHOUT CONTRAST TECHNIQUE: Multidetector CT imaging of the cervical spine was performed without intravenous contrast. Multiplanar CT image reconstructions were also generated. COMPARISON:  06/02/2014 FINDINGS: Alignment: The alignment of the cervical spine is normal. Skull base and vertebrae: The vertebral body heights are well preserved no acute fracture or dislocation identified. Soft  tissues and spinal canal: No prevertebral fluid or swelling. No visible canal hematoma. Disc levels: Multilevel disc space narrowing and endplate spurring is noted throughout the cervical spine. This is most severe at C4-5 through C6-7. Upper chest: Negative. Other: None IMPRESSION: 1. No evidence for cervical spine fracture. 2. Cervical degenerative disc disease. Electronically Signed   By: Kerby Moors M.D.   On: 02/20/2021 15:14   US RENAL  Result Date: 02/22/2021 CLINICAL DATA:  Acute kidney injury, history COPD, diabetes mellitus, hypertension EXAM: RENAL / URINARY TRACT ULTRASOUND COMPLETE COMPARISON:  03/02/2019 FINDINGS: Right Kidney: Renal measurements: 13.5 x 6.5 x 7.6 cm (volume = 350 cm^3). Normal cortical thickness. Increased cortical echogenicity. Multiple cysts, largest 4.6 x 4.3 x 3.9 cm containing a partial septation. Greater than 10 cysts present. No hydronephrosis or shadowing calcification. Left Kidney: Renal measurements: 13.1 x 5.7 x 4.9 cm (volume = 190 cm^3). Normal cortical thickness. Increased cortical echogenicity. Numerous cysts, greater than 10. Largest cyst located at medial kidney 5.1 x 4.3 x 5.8 cm. Additional mid renal cyst 4.1 x 4.3 x 4.5 cm. No solid mass or hydronephrosis. Bladder: Decompressed by Foley catheter. Bladder wall appears thickened but this may be an artifact related underdistention. Other: N/A IMPRESSION: Medical renal disease changes of both kidneys. Multiple BILATERAL renal cysts without hydronephrosis. Electronically Signed   By: Lavonia Dana M.D.   On: 02/22/2021 12:09   ECHOCARDIOGRAM COMPLETE  Result Date: 02/21/2021    ECHOCARDIOGRAM REPORT   Patient Name:   DYLAN MONFORTE Date of Exam: 02/21/2021 Medical Rec #:  [id number]         Height:       70.0 in Accession #:    [id number]        Weight:       213.0 lb Date of Birth:  09-11-1935         BSA:          2.144 m Patient Age:    48 years          BP:           162/86 mmHg Patient Gender: M                  HR:           99 bpm. Exam Location:  Inpatient Procedure: 2D Echo, Cardiac Doppler and Color Doppler Indications:    Syncope  History:        Patient has prior history of Echocardiogram examinations, most                 recent 10/08/2015. CHF, COPD, Arrythmias:Atrial Fibrillation,                 Signs/Symptoms:Syncope; Risk Factors:Diabetes and Hypertension.  Sonographer:    Wenda Low Referring Phys: [id number] [facility]  Sonographer Comments: Image acquisition challenging due to respiratory motion. IMPRESSIONS  1. Left ventricular ejection fraction, by estimation, is 55 to 60%. The left ventricle has normal function. The left  ventricle has no regional wall motion abnormalities. There is moderate asymmetric left ventricular hypertrophy of the basal-septal segment. Left ventricular diastolic parameters are indeterminate.  2. Right ventricular systolic function is normal. The right ventricular size is normal. There is moderately elevated pulmonary artery systolic pressure.  3. Left atrial size was moderately dilated.  4. The mitral valve is normal in structure. Mild mitral valve regurgitation. No evidence of mitral stenosis.  5. Tricuspid valve regurgitation is mild to moderate.  6. The aortic valve is normal in structure. Aortic valve regurgitation is mild. Aortic valve sclerosis/calcification is present, without any evidence of aortic stenosis.  7. The inferior vena cava is normal in size with greater than 50% respiratory variability, suggesting right atrial pressure of 3 mmHg. Comparison(s): A prior study was performed on 08/08/2020. Echo done with [facility] (08/08/2020), Moderate pericardial effusion had resolved. FINDINGS  Left Ventricle: Left ventricular ejection fraction, by estimation, is 55 to 60%. The left ventricle has normal function. The left ventricle has no regional wall motion abnormalities. The left ventricular internal cavity size was normal in size. There is  moderate  asymmetric left ventricular hypertrophy of the basal-septal segment. Left ventricular diastolic parameters are indeterminate. Right Ventricle: The right ventricular size is normal. No increase in right ventricular wall thickness. Right ventricular systolic function is normal. There is moderately elevated pulmonary artery systolic pressure. The tricuspid regurgitant velocity is 3.30 m/s, and with an assumed right atrial pressure of 8 mmHg, the estimated right ventricular systolic pressure is 63.8 mmHg. Left Atrium: Left atrial size was moderately dilated. Right Atrium: Right atrial size was normal in size. Pericardium: There is no evidence of pericardial effusion. Mitral Valve: The mitral valve is normal in structure. Mild to moderate mitral annular calcification. Mild mitral valve regurgitation. No evidence of mitral valve stenosis. MV peak gradient, 4.7 mmHg. The mean mitral valve gradient is 2.0 mmHg. Tricuspid Valve: The tricuspid valve is normal in structure. Tricuspid valve regurgitation is mild to moderate. No evidence of tricuspid stenosis. Aortic Valve: The aortic valve is normal in structure. There is mild aortic valve annular calcification. Aortic valve regurgitation is mild. Aortic valve sclerosis/calcification is present, without any evidence of aortic stenosis. Aortic valve mean gradient measures 5.0 mmHg. Aortic valve peak gradient measures 8.8 mmHg. Aortic valve area, by VTI measures 1.71 cm. Pulmonic Valve: The pulmonic valve was normal in structure. Pulmonic valve regurgitation is trivial. No evidence of pulmonic stenosis. Aorta: The aortic root is normal in size and structure. Venous: The inferior vena cava is normal in size with greater than 50% respiratory variability, suggesting right atrial pressure of 3 mmHg. IAS/Shunts: No atrial level shunt detected by color flow Doppler.  LEFT VENTRICLE PLAX 2D LVIDd:         4.70 cm   Diastology LVIDs:         2.90 cm   LV e' medial:    8.38 cm/s LV PW:          1.10 cm   LV E/e' medial:  11.9 LV IVS:        1.40 cm   LV e' lateral:   10.90 cm/s LVOT diam:     1.90 cm   LV E/e' lateral: 9.2 LV SV:         50 LV SV Index:   23 LVOT Area:     2.84 cm  RIGHT VENTRICLE RV Basal diam:  3.50 cm RV Mid diam:    2.90 cm RV S prime:  14.80 cm/s TAPSE (M-mode): 1.9 cm LEFT ATRIUM              Index        RIGHT ATRIUM           Index LA diam:        4.90 cm  2.29 cm/m   RA Area:     21.50 cm LA Vol (A2C):   76.0 ml  35.45 ml/m  RA Volume:   63.00 ml  29.39 ml/m LA Vol (A4C):   103.0 ml 48.04 ml/m LA Biplane Vol: 96.1 ml  44.82 ml/m  AORTIC VALVE                    PULMONIC VALVE AV Area (Vmax):    1.86 cm     PV Vmax:       1.03 m/s AV Area (Vmean):   1.68 cm     PV Peak grad:  4.2 mmHg AV Area (VTI):     1.71 cm AV Vmax:           148.00 cm/s AV Vmean:          98.400 cm/s AV VTI:            0.294 m AV Peak Grad:      8.8 mmHg AV Mean Grad:      5.0 mmHg LVOT Vmax:         97.00 cm/s LVOT Vmean:        58.200 cm/s LVOT VTI:          0.178 m LVOT/AV VTI ratio: 0.60  AORTA Ao Root diam: 3.00 cm Ao Asc diam:  2.90 cm MITRAL VALVE               TRICUSPID VALVE MV Area (PHT): 4.33 cm    TR Peak grad:   43.6 mmHg MV Area VTI:   2.44 cm    TR Vmax:        330.00 cm/s MV Peak grad:  4.7 mmHg MV Mean grad:  2.0 mmHg    SHUNTS MV Vmax:       1.08 m/s    Systemic VTI:  0.18 m MV Vmean:      56.6 cm/s   Systemic Diam: 1.90 cm MV Decel Time: 175 msec MV E velocity: 99.80 cm/s Kardie Tobb DO Electronically signed by Berniece Salines DO Signature Date/Time: 02/21/2021/11:32:50 AM    Final      Subjective: Denies dyspnea, feeling ok.    Discharge Exam: Vitals:   02/24/21 0352 02/24/21 0734  BP: (!) 144/75   Pulse: 72 62  Resp: 17 18  Temp: 97.9 F (36.6 C)   SpO2: 98% 99%     General: Pt is alert, awake, not in acute distress Cardiovascular: RRR, S1/S2 +, no rubs, no gallops Respiratory: CTA bilaterally, no wheezing, no rhonchi Abdominal: Soft, NT, ND, bowel  sounds + Extremities: no edema, no cyanosis    The results of significant diagnostics from this hospitalization (including imaging, microbiology, ancillary and laboratory) are listed below for reference.     Microbiology: Recent Results (from the past 240 hour(s))  Resp Panel by RT-PCR (Flu A&B, Covid) Nasopharyngeal Swab     Status: None   Collection Time: 02/20/21  3:14 PM   Specimen: Nasopharyngeal Swab; Nasopharyngeal(NP) swabs in vial transport medium  Result Value Ref Range Status   SARS Coronavirus 2 by RT PCR NEGATIVE NEGATIVE Final    Comment: (NOTE) SARS-CoV-2 target  nucleic acids are NOT DETECTED.  The SARS-CoV-2 RNA is generally detectable in upper respiratory specimens during the acute phase of infection. The lowest concentration of SARS-CoV-2 viral copies this assay can detect is 138 copies/mL. A negative result does not preclude SARS-Cov-2 infection and should not be used as the sole basis for treatment or other patient management decisions. A negative result may occur with  improper specimen collection/handling, submission of specimen other than nasopharyngeal swab, presence of viral mutation(s) within the areas targeted by this assay, and inadequate number of viral copies(<138 copies/mL). A negative result must be combined with clinical observations, patient history, and epidemiological information. The expected result is Negative.  Fact Sheet for Patients:  EntrepreneurPulse.com.au  Fact Sheet for Healthcare Providers:  IncredibleEmployment.be  This test is no t yet approved or cleared by the [place] FDA and  has been authorized for detection and/or diagnosis of SARS-CoV-2 by FDA under an Emergency Use Authorization (EUA). This EUA will remain  in effect (meaning this test can be used) for the duration of the COVID-19 declaration under Section 564(b)(1) of the Act, 21 U.S.C.section 360bbb-3(b)(1), unless the  authorization is terminated  or revoked sooner.       Influenza A by PCR NEGATIVE NEGATIVE Final   Influenza B by PCR NEGATIVE NEGATIVE Final    Comment: (NOTE) The Xpert Xpress SARS-CoV-2/FLU/RSV plus assay is intended as an aid in the diagnosis of influenza from Nasopharyngeal swab specimens and should not be used as a sole basis for treatment. Nasal washings and aspirates are unacceptable for Xpert Xpress SARS-CoV-2/FLU/RSV testing.  Fact Sheet for Patients: EntrepreneurPulse.com.au  Fact Sheet for Healthcare Providers: IncredibleEmployment.be  This test is not yet approved or cleared by the [place] FDA and has been authorized for detection and/or diagnosis of SARS-CoV-2 by FDA under an Emergency Use Authorization (EUA). This EUA will remain in effect (meaning this test can be used) for the duration of the COVID-19 declaration under Section 564(b)(1) of the Act, 21 U.S.C. section 360bbb-3(b)(1), unless the authorization is terminated or revoked.  Performed at [facility], [place] [address] [place], [place] [address]   Urine Culture     Status: None (Preliminary result)   Collection Time: 02/22/21 10:41 AM   Specimen: Urine, Clean Catch  Result Value Ref Range Status   Specimen Description URINE, CLEAN CATCH  Final   Special Requests NONE  Final   Culture   Final    CULTURE REINCUBATED FOR BETTER GROWTH Performed at [facility], [place] [address] [place], [place] [address]    Report Status PENDING  Incomplete     Labs: BNP (last 3 results) No results for input(s): BNP in the last 8760 hours. Basic Metabolic Panel: Recent Labs  Lab 02/20/21 1312 02/21/21 0326 02/22/21 0045 02/23/21 0027 02/24/21 0323  NA 139 138 137 137 138  K 3.0* 2.8* 3.6 3.7 3.7  CL 103 106 106 109 107  CO2 26 24 22 22 22   GLUCOSE 134* 100* 159* 100* 96  BUN 66* 61* 63* 62* 57*  CREATININE 3.08* 2.88* 3.17* 3.52* 3.39*  CALCIUM 9.5  9.0 8.7* 8.3* 8.5*  MG 1.7  --   --   --   --   PHOS  --   --   --   --  2.6   Liver Function Tests: Recent Labs  Lab 02/20/21 1312 02/21/21 0326 02/24/21 0323  AST 59* 37  --   ALT 54* 42  --  ALKPHOS 87 70  --   BILITOT 1.4* 1.4*  --   PROT 5.1* 4.6*  --   ALBUMIN 2.6* 2.2* 2.8*   No results for input(s): LIPASE, AMYLASE in the last 168 hours. No results for input(s): AMMONIA in the last 168 hours. CBC: Recent Labs  Lab 02/20/21 1312 02/21/21 0326 02/22/21 0045 02/23/21 0027 02/24/21 0323  WBC 15.1* 11.0* 10.5 9.0 7.0  HGB 12.7* 11.9* 10.3* 9.7* 9.4*  HCT 39.5 36.0* 32.7* 30.2* 29.5*  MCV 92.7 92.5 95.9 93.8 93.4  PLT 188 161 154 147* 139*   Cardiac Enzymes: Recent Labs  Lab 02/20/21 1312  CKTOTAL 496*   BNP: Invalid input(s): POCBNP CBG: Recent Labs  Lab 02/23/21 0629 02/23/21 1219 02/23/21 1546 02/23/21 2112 02/24/21 0607  GLUCAP 106* 123* 118* 123* 108*   D-Dimer No results for input(s): DDIMER in the last 72 hours. Hgb A1c No results for input(s): HGBA1C in the last 72 hours. Lipid Profile No results for input(s): CHOL, HDL, LDLCALC, TRIG, CHOLHDL, LDLDIRECT in the last 72 hours. Thyroid function studies No results for input(s): TSH, T4TOTAL, T3FREE, THYROIDAB in the last 72 hours.  Invalid input(s): FREET3 Anemia work up Recent Labs    02/24/21 0323  FERRITIN 67  TIBC 193*  IRON 34*   Urinalysis    Component Value Date/Time   COLORURINE YELLOW 02/21/2021 2328   APPEARANCEUR CLOUDY (A) 02/21/2021 2328   LABSPEC 1.020 02/21/2021 2328   PHURINE 6.0 02/21/2021 [place] 02/21/2021 2328   HGBUR MODERATE (A) 02/21/2021 2328   BILIRUBINUR NEGATIVE 02/21/2021 2328   [place] 02/21/2021 2328   PROTEINUR >300 (A) 02/21/2021 2328   UROBILINOGEN 0.2 09/26/2012 1222   NITRITE NEGATIVE 02/21/2021 2328   LEUKOCYTESUR TRACE (A) 02/21/2021 2328   Sepsis Labs Invalid input(s): PROCALCITONIN,  WBC,   LACTICIDVEN Microbiology Recent Results (from the past 240 hour(s))  Resp Panel by RT-PCR (Flu A&B, Covid) Nasopharyngeal Swab     Status: None   Collection Time: 02/20/21  3:14 PM   Specimen: Nasopharyngeal Swab; Nasopharyngeal(NP) swabs in vial transport medium  Result Value Ref Range Status   SARS Coronavirus 2 by RT PCR NEGATIVE NEGATIVE Final    Comment: (NOTE) SARS-CoV-2 target nucleic acids are NOT DETECTED.  The SARS-CoV-2 RNA is generally detectable in upper respiratory specimens during the acute phase of infection. The lowest concentration of SARS-CoV-2 viral copies this assay can detect is 138 copies/mL. A negative result does not preclude SARS-Cov-2 infection and should not be used as the sole basis for treatment or other patient management decisions. A negative result may occur with  improper specimen collection/handling, submission of specimen other than nasopharyngeal swab, presence of viral mutation(s) within the areas targeted by this assay, and inadequate number of viral copies(<138 copies/mL). A negative result must be combined with clinical observations, patient history, and epidemiological information. The expected result is Negative.  Fact Sheet for Patients:  EntrepreneurPulse.com.au  Fact Sheet for Healthcare Providers:  IncredibleEmployment.be  This test is no t yet approved or cleared by the [place] FDA and  has been authorized for detection and/or diagnosis of SARS-CoV-2 by FDA under an Emergency Use Authorization (EUA). This EUA will remain  in effect (meaning this test can be used) for the duration of the COVID-19 declaration under Section 564(b)(1) of the Act, 21 U.S.C.section 360bbb-3(b)(1), unless the authorization is terminated  or revoked sooner.       Influenza A by PCR NEGATIVE NEGATIVE Final  Influenza B by PCR NEGATIVE NEGATIVE Final    Comment: (NOTE) The Xpert Xpress SARS-CoV-2/FLU/RSV plus  assay is intended as an aid in the diagnosis of influenza from Nasopharyngeal swab specimens and should not be used as a sole basis for treatment. Nasal washings and aspirates are unacceptable for Xpert Xpress SARS-CoV-2/FLU/RSV testing.  Fact Sheet for Patients: EntrepreneurPulse.com.au  Fact Sheet for Healthcare Providers: IncredibleEmployment.be  This test is not yet approved or cleared by the [place] FDA and has been authorized for detection and/or diagnosis of SARS-CoV-2 by FDA under an Emergency Use Authorization (EUA). This EUA will remain in effect (meaning this test can be used) for the duration of the COVID-19 declaration under Section 564(b)(1) of the Act, 21 U.S.C. section 360bbb-3(b)(1), unless the authorization is terminated or revoked.  Performed at [facility], [place] [address], [place], [place] [address]   Urine Culture     Status: None (Preliminary result)   Collection Time: 02/22/21 10:41 AM   Specimen: Urine, Clean Catch  Result Value Ref Range Status   Specimen Description URINE, CLEAN CATCH  Final   Special Requests NONE  Final   Culture   Final    CULTURE REINCUBATED FOR [employer]TER [employer] Performed at [facility], [place] [address], [place], [place] [address]    Report Status PENDING  Incomplete     Time coordinating discharge: 40 minutes  SIGNED:   Elmarie Shiley, MD  [facility]

## 2021-02-24 NOTE — Progress Notes (Signed)
Pt placed on CPAP at previous settimgs. Tol well.

## 2021-02-25 LAB — BASIC METABOLIC PANEL
Anion gap: 10 (ref 5–15)
BUN: 54 mg/dL — ABNORMAL HIGH (ref 8–23)
CO2: 22 mmol/L (ref 22–32)
Calcium: 8.5 mg/dL — ABNORMAL LOW (ref 8.9–10.3)
Chloride: 106 mmol/L (ref 98–111)
Creatinine, Ser: 3.38 mg/dL — ABNORMAL HIGH (ref 0.61–1.24)
GFR, Estimated: 17 mL/min — ABNORMAL LOW (ref >60)
Glucose, Bld: 84 mg/dL (ref 70–99)
Potassium: 3.9 mmol/L (ref 3.5–5.1)
Sodium: 138 mmol/L (ref 135–145)

## 2021-02-25 LAB — URINE CULTURE: Culture: >=100000 — AB

## 2021-02-25 LAB — GLUCOSE, CAPILLARY
Glucose-Capillary: 93 mg/dL (ref 70–99)
Glucose-Capillary: 130 mg/dL — ABNORMAL HIGH (ref 70–99)
Glucose-Capillary: 115 mg/dL — ABNORMAL HIGH (ref 70–99)
Glucose-Capillary: 122 mg/dL — ABNORMAL HIGH (ref 70–99)

## 2021-02-25 NOTE — Progress Notes (Signed)
Pt placed on cpap for the night, tol well

## 2021-02-25 NOTE — Progress Notes (Signed)
PROGRESS NOTE    Roy [name]  NGE:[id number] DOB: 05-01-1935 DOA: 02/20/2021 PCP: Rogers Blocker, MD   Brief Narrative: 85 year old with past medical history significant for chronic A. fib on Eliquis, hypertension, diabetes type 2, OSA on CPAP, chronic obstructive airway disease with asthma, BPH, anemia of chronic kidney disease with weekly injection who presents to the ED after a fall sometimes the night prior to admission.  He was found by his son the day of admission.  Unknown for how long patient was down, unclear if syncope versus mechanical fall. Patient had a gastrointestinal illness with diarrhea nausea vomiting that is started the day prior to admission.  Poor oral intake. Patient was found to have leukocytosis white blood cell 15, hemoglobin 12, potassium 3.0, creatinine 3.0, mild transaminases, CT head right supraorbital and right frontal scalp hematoma without underlying skull fracture or foreign body.  CT cervical spine no acute fracture.  Patient admitted for recurrent fall questionable syncope, AKI.  Assessment & Plan:   Principal Problem:   Syncope and collapse Active Problems:   DM2 (diabetes mellitus, type 2) (HCC)   OSA on CPAP   Essential hypertension   Chronic obstructive airway disease with asthma (HCC)   Persistent atrial fibrillation (HCC)   Benign prostatic hyperplasia with urinary obstruction   Hypokalemia   Elevated LFTs   Leukocytosis   Acute renal failure superimposed on stage 3 chronic kidney disease (HCC)   Chronic diastolic CHF (congestive heart failure) ([place])   1-Recurrent Fall, questionable Syncope: -Orthostatic vitals negative.  -Treated with IV fluid -Echo; normal ejection fraction, moderate pericardial effusion resolved -PT OT consulted -Suspect related to dehydration,  related to recent GI illness.  Hypotensive during hospitalization.   Hydralazine discontinue.  No further episodes.   2-AKI superimposed on CKD stage IIIB, hematuria.   Suspect related to hypovolemia, hemodynamic and urine retention.  Received  IV fluids. He will probably  need to be discharge with foley.  Renal US. Negative for hydronephrosis.  urine culture: no growth. Started IV ceftriaxone due to hematuria and pyuria./ completed 3 days.  Cr down close to baseline. Patient stable to transfer to SNF.  Patient confirm he would not want HD.  He would not want resuscitation. He wishes to be DNR. Family wants time to speak with patient. Will consult palliative care.   3-Hypokalemia: Replaced.  4-Transaminases: Resolved Hepatitis panel negative  5-Diarrhea right lower quadrant pain Resolved.   6 leukocytosis: Likely reactive Constipation; Had BM  Gait instability, frequent fall: Check B12.  PT  Persistent A. fib: Rate controlled, continue with Eliquis and Cardizem  Chronic diastolic heart failure: Compensated.  Holding diuretics Hypertension: Continue with Cardizem and labetalol Diabetes type 2: Continue with sliding scale insulin COPD: Stable OSA on CPAP continue with CPAP     Estimated body mass index is 29.96 kg/m as calculated from the following:   Height as of this encounter: 5\' 10"  (1.778 m).   Weight as of this encounter: 94.7 kg.   DVT prophylaxis: Eliquis Code Status: Full code Family Communication: Son updated 12/03 Disposition Plan:  Status is: Observation  The patient remains OBS appropriate and will d/c before 2 midnights.       Consultants:  none  Procedures:  ECHO  Antimicrobials:    Subjective: Sleepy, answer questions.   Objective: Vitals:   02/24/21 1655 02/24/21 1927 02/25/21 0457 02/25/21 0457  BP: 121/61 (!) 143/64  (!) 157/89  Pulse:  (!) 56  61  Resp:  19  19  Temp:  97.7 F (36.5 C)  97.6 F (36.4 C)  TempSrc:  Oral  Oral  SpO2:  96%  98%  Weight:   94.7 kg   Height:        Intake/Output Summary (Last 24 hours) at 02/25/2021 0720 Last data filed at 02/25/2021 0457 Gross per 24 hour   Intake 360 ml  Output 850 ml  Net -490 ml    Filed Weights   02/23/21 0334 02/24/21 0352 02/25/21 0457  Weight: 91.4 kg 94.4 kg 94.7 kg    Examination:  General exam: NAD Respiratory system:CTA Cardiovascular system: S 1, S 2 RRR Gastrointestinal system: BS present, soft, nt Central nervous system: Alert, follows command Extremities: No edema   Data Reviewed: I have personally reviewed following labs and imaging studies  CBC: Recent Labs  Lab 02/20/21 1312 02/21/21 0326 02/22/21 0045 02/23/21 0027 02/24/21 0323  WBC 15.1* 11.0* 10.5 9.0 7.0  HGB 12.7* 11.9* 10.3* 9.7* 9.4*  HCT 39.5 36.0* 32.7* 30.2* 29.5*  MCV 92.7 92.5 95.9 93.8 93.4  PLT 188 161 154 147* 139*    Basic Metabolic Panel: Recent Labs  Lab 02/20/21 1312 02/21/21 0326 02/22/21 0045 02/23/21 0027 02/24/21 0323  NA 139 138 137 137 138  K 3.0* 2.8* 3.6 3.7 3.7  CL 103 106 106 109 107  CO2 26 24 22 22 22   GLUCOSE 134* 100* 159* 100* 96  BUN 66* 61* 63* 62* 57*  CREATININE 3.08* 2.88* 3.17* 3.52* 3.39*  CALCIUM 9.5 9.0 8.7* 8.3* 8.5*  MG 1.7  --   --   --   --   PHOS  --   --   --   --  2.6    GFR: Estimated Creatinine Clearance: 19.1 mL/min (A) (by C-G formula based on SCr of 3.39 mg/dL (H)). Liver Function Tests: Recent Labs  Lab 02/20/21 1312 02/21/21 0326 02/24/21 0323  AST 59* 37  --   ALT 54* 42  --   ALKPHOS 87 70  --   BILITOT 1.4* 1.4*  --   PROT 5.1* 4.6*  --   ALBUMIN 2.6* 2.2* 2.8*    No results for input(s): LIPASE, AMYLASE in the last 168 hours. No results for input(s): AMMONIA in the last 168 hours. Coagulation Profile: No results for input(s): INR, PROTIME in the last 168 hours. Cardiac Enzymes: Recent Labs  Lab 02/20/21 1312  CKTOTAL 496*    BNP (last 3 results) No results for input(s): PROBNP in the last 8760 hours. HbA1C: No results for input(s): HGBA1C in the last 72 hours.  CBG: Recent Labs  Lab 02/24/21 0607 02/24/21 1209 02/24/21 1619  02/24/21 2103 02/25/21 0559  GLUCAP 108* 119* 155* 118* 93    Lipid Profile: No results for input(s): CHOL, HDL, LDLCALC, TRIG, CHOLHDL, LDLDIRECT in the last 72 hours. Thyroid Function Tests: No results for input(s): TSH, T4TOTAL, FREET4, T3FREE, THYROIDAB in the last 72 hours.  Anemia Panel: Recent Labs    02/24/21 0323  FERRITIN 67  TIBC 193*  IRON 34*    Sepsis Labs: No results for input(s): PROCALCITON, LATICACIDVEN in the last 168 hours.  Recent Results (from the past 240 hour(s))  Resp Panel by RT-PCR (Flu A&B, Covid) Nasopharyngeal Swab     Status: None   Collection Time: 02/20/21  3:14 PM   Specimen: Nasopharyngeal Swab; Nasopharyngeal(NP) swabs in vial transport medium  Result Value Ref Range Status   SARS Coronavirus 2 by RT PCR NEGATIVE NEGATIVE Final  Comment: (NOTE) SARS-CoV-2 target nucleic acids are NOT DETECTED.  The SARS-CoV-2 RNA is generally detectable in upper respiratory specimens during the acute phase of infection. The lowest concentration of SARS-CoV-2 viral copies this assay can detect is 138 copies/mL. A negative result does not preclude SARS-Cov-2 infection and should not be used as the sole basis for treatment or other patient management decisions. A negative result may occur with  improper specimen collection/handling, submission of specimen other than nasopharyngeal swab, presence of viral mutation(s) within the areas targeted by this assay, and inadequate number of viral copies(<138 copies/mL). A negative result must be combined with clinical observations, patient history, and epidemiological information. The expected result is Negative.  Fact Sheet for Patients:  EntrepreneurPulse.com.au  Fact Sheet for Healthcare Providers:  IncredibleEmployment.be  This test is no t yet approved or cleared by the [place] FDA and  has been authorized for detection and/or diagnosis of SARS-CoV-2 by FDA  under an Emergency Use Authorization (EUA). This EUA will remain  in effect (meaning this test can be used) for the duration of the COVID-19 declaration under Section 564(b)(1) of the Act, 21 U.S.C.section 360bbb-3(b)(1), unless the authorization is terminated  or revoked sooner.       Influenza A by PCR NEGATIVE NEGATIVE Final   Influenza B by PCR NEGATIVE NEGATIVE Final    Comment: (NOTE) The Xpert Xpress SARS-CoV-2/FLU/RSV plus assay is intended as an aid in the diagnosis of influenza from Nasopharyngeal swab specimens and should not be used as a sole basis for treatment. Nasal washings and aspirates are unacceptable for Xpert Xpress SARS-CoV-2/FLU/RSV testing.  Fact Sheet for Patients: EntrepreneurPulse.com.au  Fact Sheet for Healthcare Providers: IncredibleEmployment.be  This test is not yet approved or cleared by the [place] FDA and has been authorized for detection and/or diagnosis of SARS-CoV-2 by FDA under an Emergency Use Authorization (EUA). This EUA will remain in effect (meaning this test can be used) for the duration of the COVID-19 declaration under Section 564(b)(1) of the Act, 21 U.S.C. section 360bbb-3(b)(1), unless the authorization is terminated or revoked.  Performed at [facility], [place] [address] [place], [place] [address]   Urine Culture     Status: Abnormal (Preliminary result)   Collection Time: 02/22/21 10:41 AM   Specimen: Urine, Clean Catch  Result Value Ref Range Status   Specimen Description URINE, CLEAN CATCH  Final   Special Requests NONE  Final   Culture (A)  Final    >=100,000 COLONIES/mL STAPHYLOCOCCUS EPIDERMIDIS SUSCEPTIBILITIES TO FOLLOW Performed at [facility], [place] [address] [place], [place] [address]    Report Status PENDING  Incomplete  Resp Panel by RT-PCR (Flu A&B, Covid) Nasopharyngeal Swab     Status: None   Collection Time: 02/24/21 11:30 AM   Specimen:  Nasopharyngeal Swab; Nasopharyngeal(NP) swabs in vial transport medium  Result Value Ref Range Status   SARS Coronavirus 2 by RT PCR NEGATIVE NEGATIVE Final    Comment: (NOTE) SARS-CoV-2 target nucleic acids are NOT DETECTED.  The SARS-CoV-2 RNA is generally detectable in upper respiratory specimens during the acute phase of infection. The lowest concentration of SARS-CoV-2 viral copies this assay can detect is 138 copies/mL. A negative result does not preclude SARS-Cov-2 infection and should not be used as the sole basis for treatment or other patient management decisions. A negative result may occur with  improper specimen collection/handling, submission of specimen other than nasopharyngeal swab, presence of viral mutation(s) within the areas targeted by this  assay, and inadequate number of viral copies(<138 copies/mL). A negative result must be combined with clinical observations, patient history, and epidemiological information. The expected result is Negative.  Fact Sheet for Patients:  EntrepreneurPulse.com.au  Fact Sheet for Healthcare Providers:  IncredibleEmployment.be  This test is no t yet approved or cleared by the [place] FDA and  has been authorized for detection and/or diagnosis of SARS-CoV-2 by FDA under an Emergency Use Authorization (EUA). This EUA will remain  in effect (meaning this test can be used) for the duration of the COVID-19 declaration under Section 564(b)(1) of the Act, 21 U.S.C.section 360bbb-3(b)(1), unless the authorization is terminated  or revoked sooner.       Influenza A by PCR NEGATIVE NEGATIVE Final   Influenza B by PCR NEGATIVE NEGATIVE Final    Comment: (NOTE) The Xpert Xpress SARS-CoV-2/FLU/RSV plus assay is intended as an aid in the diagnosis of influenza from Nasopharyngeal swab specimens and should not be used as a sole basis for treatment. Nasal washings and aspirates are unacceptable for  Xpert Xpress SARS-CoV-2/FLU/RSV testing.  Fact Sheet for Patients: EntrepreneurPulse.com.au  Fact Sheet for Healthcare Providers: IncredibleEmployment.be  This test is not yet approved or cleared by the [place] FDA and has been authorized for detection and/or diagnosis of SARS-CoV-2 by FDA under an Emergency Use Authorization (EUA). This EUA will remain in effect (meaning this test can be used) for the duration of the COVID-19 declaration under Section 564(b)(1) of the Act, 21 U.S.C. section 360bbb-3(b)(1), unless the authorization is terminated or revoked.  Performed at [facility], [place] [address], [place], [place] [address]           Radiology Studies: No results found.      Scheduled Meds:  apixaban  2.5 mg Oral BID   Chlorhexidine Gluconate Cloth  6 each Topical Daily   cinacalcet  30 mg Oral Daily   diltiazem  240 mg Oral Daily   finasteride  5 mg Oral Daily   fluticasone furoate-vilanterol  1 puff Inhalation Daily   insulin aspart  0-9 Units Subcutaneous TID WC   isosorbide dinitrate  30 mg Oral TID   labetalol  100 mg Oral BID   pantoprazole  40 mg Oral BID   sodium chloride flush  3 mL Intravenous Q12H   tamsulosin  0.4 mg Oral Daily   umeclidinium bromide  1 puff Inhalation Daily   Continuous Infusions:  cefTRIAXone (ROCEPHIN)  IV 1 g (02/24/21 1219)     LOS: 3 days    Time spent: 35 minutes    Jamilee Lafosse A Jennilee Demarco, MD [facility]   If 7PM-7AM, please contact night-coverage [email or web address]  02/25/2021, 7:20 AM

## 2021-02-25 NOTE — Progress Notes (Signed)
Mobility Specialist: Progress Note   02/25/21 1124  Mobility  Activity Ambulated in hall  Level of Assistance Moderate assist, patient does 50-74%  Assistive Device Four wheel walker  Distance Ambulated (ft) 240 ft  Mobility Ambulated with assistance in hallway  Mobility Response Tolerated well  Mobility performed by Mobility specialist  Bed Position Chair  $Mobility charge 1 Mobility   Pt required modA to stand from the chair and was contact guard during ambulation. Pt stopped to take several brief standing breaks d/t fatigue, otherwise no c/o. Pt back to recliner after walk with call bell and phone in reach.   [name] Mobility Specialist Mobility Specialist Phone #1: [phone] Mobility Specialist Phone #2: [phone]

## 2021-02-25 NOTE — TOC Progression Note (Signed)
Transition of Care [facility]) - Progression Note    Patient Details  Name: Roy [name] MRN: [id number] Date of Birth: 1935-06-28  Transition of Care [facility]) CM/SW [place], LCSW Phone Number:[phone] 02/25/2021, 8:43 AM  Clinical Narrative:     CSW checked Navi porta and pt's authorization is still pending.  TOC team will continue to assist with discharge planning needs.     Expected Discharge Plan: [place] Barriers to Discharge: Continued Medical Work up  Expected Discharge Plan and Services Expected Discharge Plan: [place] In-house Referral: Clinical Social Work   Post Acute Care Choice: [place] Living arrangements for the past 2 months: Single Family Home Expected Discharge Date: 02/24/21                                     Social Determinants of Health (SDOH) Interventions    Readmission Risk Interventions No flowsheet data found.

## 2021-02-25 NOTE — TOC Progression Note (Addendum)
Transition of Care [facility]) - Progression Note    Patient Details  Name: Roy [name] MRN: [id number] Date of Birth: 1935-04-17  Transition of Care [facility]) CM/SW [place], LCSW Phone Number:[phone] 02/25/2021, 10:33 AM  Clinical Narrative:     CSW called [place] to ascertain the reason in the delay for insurance decisions. CSW was informed that authorization was still processing. CSW informed Navi that pt is medically ready to be DC so can someone review it. CSW is awaiting an Producer, television/film/video.  1:59PM- CSW checked portal and authorization is still pending.  TOC team will continue to assist with discharge planning needs.   Expected Discharge Plan: [place] Barriers to Discharge: Continued Medical Work up  Expected Discharge Plan and Services Expected Discharge Plan: [place] In-house Referral: Clinical Social Work   Post Acute Care Choice: [place] Living arrangements for the past 2 months: Single Family Home Expected Discharge Date: 02/24/21                                     Social Determinants of Health (SDOH) Interventions    Readmission Risk Interventions No flowsheet data found.

## 2021-02-26 LAB — GLUCOSE, CAPILLARY
Glucose-Capillary: 85 mg/dL (ref 70–99)
Glucose-Capillary: 112 mg/dL — ABNORMAL HIGH (ref 70–99)
Glucose-Capillary: 111 mg/dL — ABNORMAL HIGH (ref 70–99)

## 2021-02-26 MED ORDER — ZALEPLON 5 MG PO CAPS: 5.0000 mg | ORAL_CAPSULE | Freq: Every evening | ORAL | 0 refills | Status: DC | PRN

## 2021-02-26 NOTE — TOC Progression Note (Signed)
Transition of Care [facility]) - Progression Note    Patient Details  Name: Roy [name] MRN: [id number] Date of Birth: 1935/09/28  Transition of Care [facility]) CM/SW [place], [place] Phone Number: 02/26/2021, 11:40 AM  Clinical Narrative:     CSW received insurance authorization approval. # [id number]. Insurance approval from 12/5-12/7. Juliann Pulse with [facility] confirmed she can accept patient for SNF placement today. CSW  called patients son [name] who confirmed he can being patients cpap from home to SNF. CSW informed Juliann Pulse with [facility].  CSW informed MD.  Expected Discharge Plan: [place] Barriers to Discharge: No Barriers Identified  Expected Discharge Plan and Services Expected Discharge Plan: [place] In-house Referral: Clinical Social Work   Post Acute Care Choice: [place] Living arrangements for the past 2 months: Single Family Home Expected Discharge Date: 02/24/21                                     Social Determinants of Health (SDOH) Interventions    Readmission Risk Interventions No flowsheet data found.

## 2021-02-26 NOTE — Progress Notes (Signed)
Gave report and answered all questions to [place], Therapist, sports at [facility] that is receiving the patient. AVS is in packet. Awaiting PTAR. Patient made aware of going to a facility. Wants to sleep before getting ready for facility.

## 2021-02-26 NOTE — Plan of Care (Signed)
  Problem: Activity: Goal: Risk for activity intolerance will decrease Outcome: Progressing   Problem: Nutrition: Goal: Adequate nutrition will be maintained Outcome: Progressing   Problem: Elimination: Goal: Will not experience complications related to urinary retention Outcome: Progressing   

## 2021-02-26 NOTE — Discharge Summary (Signed)
Physician Discharge Summary  SALAH NAKAMURA ZCH:[id number] DOB: 07-Apr-1935 DOA: 02/20/2021  PCP: Rogers Blocker, MD  Admit date: 02/20/2021 Discharge date: 02/26/2021  Admitted From: Home  Disposition:  SNF  Recommendations for Outpatient Follow-up:  Follow up with PCP in 1-2 weeks. Please obtain BMP/CBC in one week. He will need follow up with urology for voiding trial.  Palliative care consult for goals of care, discussed Code status, and HD.    Discharge Condition: Stable.  CODE STATUS: DNR Diet recommendation: Heart Healthy   Brief/Interim Summary: 85 year old with past medical history significant for chronic A. fib on Eliquis, hypertension, diabetes type 2, OSA on CPAP, chronic obstructive airway disease with asthma, BPH, anemia of chronic kidney disease with weekly injection who presents to the ED after a fall sometimes the night prior to admission.  He was found by his son the day of admission.  Unknown for how long patient was down, unclear if syncope versus mechanical fall. Patient had a gastrointestinal illness with diarrhea nausea vomiting that is started the day prior to admission.  Poor oral intake. Patient was found to have leukocytosis white blood cell 15, hemoglobin 12, potassium 3.0, creatinine 3.0, mild transaminases, CT head right supraorbital and right frontal scalp hematoma without underlying skull fracture or foreign body.  CT cervical spine no acute fracture.   Patient admitted for recurrent fall questionable syncope, AKI.    1-Recurrent Fall, questionable Syncope: -Orthostatic vitals negative.  -Treated with IV fluid -Echo; normal ejection fraction, moderate pericardial effusion resolved -PT OT consulted -Suspect related to dehydration,  related to recent GI illness.  Hypotensive during hospitalization.   Hydralazine discontinue.  No further episodes.   2-AKI superimposed on CKD stage IIIB, hematuria.  Creatinine 1 year ago 1.8. Per nephrologist records  prior Cr 2.8  Suspect related to hypovolemia, hemodynamic and urine retention.  Received  IV fluids. He will probably  need to be discharge with foley.  Renal US. Negative for hydronephrosis.  Continue with IV fluids.  urine culture: no growth. Started IV ceftriaxone due to hematuria and pyuria./ completed 3 days.  Cr down close to baseline. Patient stable to transfer to SNF.  Patient confirm he would not want HD.  He would not want resuscitation. He wishes to be DNR. Family wants time to speak with patient. Plan for Palliative care outpatient. Needs discussion Code status. Remain Full Code.    3-Hypokalemia: Replaced.   4-Transaminases: Resolved Hepatitis panel negative   5-Diarrhea right lower quadrant pain Resolved.    6 Leukocytosis: Likely reactive Constipation; started  sorbitol.    Gait instability, frequent fall: B12 400.  PT   Persistent A. fib: Rate controlled, continue with Eliquis and Cardizem   Chronic diastolic heart failure: Compensated.  Resume Torsemide at discharge, 50 mg daily per nephrologist recommendation.  Hypertension: Continue with Cardizem and labetalol Diabetes type 2: Continue with sliding scale insulin COPD: Stable OSA on CPAP continue with CPAP    Stable for discharge.     Discharge Diagnoses:  Principal Problem:   Syncope and collapse Active Problems:   DM2 (diabetes mellitus, type 2) (HCC)   OSA on CPAP   Essential hypertension   Chronic obstructive airway disease with asthma (HCC)   Persistent atrial fibrillation (HCC)   Benign prostatic hyperplasia with urinary obstruction   Hypokalemia   Elevated LFTs   Leukocytosis   Acute renal failure superimposed on stage 3 chronic kidney disease (HCC)   Chronic diastolic CHF (congestive heart failure) ([place])  Discharge Instructions  Discharge Instructions     Diet - low sodium heart healthy   Complete by: As directed    Diet - low sodium heart healthy   Complete by: As directed     Increase activity slowly   Complete by: As directed    Increase activity slowly   Complete by: As directed       Allergies as of 02/26/2021   No Known Allergies      Medication List     STOP taking these medications    hydrALAZINE 50 MG tablet Commonly known as: APRESOLINE   methenamine 1 g tablet Commonly known as: HIPREX   potassium chloride SA 20 MEQ tablet Commonly known as: KLOR-CON M       TAKE these medications    cinacalcet 30 MG tablet Commonly known as: SENSIPAR Take 30 mg by mouth daily.   Contour Next Test test strip Generic drug: glucose blood daily.   diltiazem 240 MG 24 hr capsule Commonly known as: CARDIZEM CD Take 240 mg by mouth daily.   Eliquis 2.5 MG Tabs tablet Generic drug: apixaban Take 2.5 mg by mouth 2 (two) times daily.   finasteride 5 MG tablet Commonly known as: PROSCAR Take 5 mg by mouth daily.   gabapentin 100 MG capsule Commonly known as: NEURONTIN Take 100 mg by mouth daily.   isosorbide dinitrate 30 MG tablet Commonly known as: ISORDIL TAKE 1 TABLET BY MOUTH THREE TIMES DAILY   labetalol 100 MG tablet Commonly known as: NORMODYNE Take 1 tablet (100 mg total) by mouth 2 (two) times daily.   Microlet Lancets Misc   pantoprazole 40 MG tablet Commonly known as: PROTONIX Take 40 mg by mouth daily.   tamsulosin 0.4 MG Caps capsule Commonly known as: FLOMAX Take 1 capsule (0.4 mg total) by mouth daily.   torsemide 100 MG tablet Commonly known as: DEMADEX Take 0.5 tablets (50 mg total) by mouth daily. What changed: how much to take   Trelegy Ellipta 100-62.5-25 MCG/ACT Aepb Generic drug: Fluticasone-Umeclidin-Vilant Inhale 1 puff into the lungs daily.   zaleplon 5 MG capsule Commonly known as: Sonata Take 1 capsule (5 mg total) by mouth at bedtime as needed for sleep.        No Known Allergies  Consultations: Nephrology    Procedures/Studies: DG Facial Bones Complete  Result Date:  02/12/2021 CLINICAL DATA:  Fall several days ago with right eye bruising, initial encounter EXAM: FACIAL BONES COMPLETE 3+V COMPARISON:  None. FINDINGS: There is no evidence of fracture or other significant bone abnormality. No orbital emphysema or sinus air-fluid levels are seen. IMPRESSION: No acute abnormality noted. Electronically Signed   By: Inez Catalina M.D.   On: 02/12/2021 20:54   CT HEAD WO CONTRAST (5MM)  Result Date: 02/20/2021 CLINICAL DATA:  [place] yesterday at home. Right frontal scalp hematoma. EXAM: CT HEAD WITHOUT CONTRAST TECHNIQUE: Contiguous axial images were obtained from the base of the skull through the vertex without intravenous contrast. COMPARISON:  10/06/2015 FINDINGS: Brain: Stable age related cerebral atrophy, ventriculomegaly and periventricular white matter disease. No extra-axial fluid collections are identified. No CT findings for acute hemispheric infarction or intracranial hemorrhage. No mass lesions. The brainstem and cerebellum are normal. Vascular: Stable vascular calcifications. No aneurysm or hyperdense vessels. Skull: No skull fracture or bone lesions. Sinuses/Orbits: Scattered ethmoid sinus disease. The mastoid air cells and middle ear cavities are clear. The globes are intact. Other: Right supraorbital and right frontal scalp hematoma without underlying skull  fracture or radiopaque foreign body. IMPRESSION: 1. Right supraorbital and right frontal scalp hematoma without underlying skull fracture or radiopaque foreign body. 2. Stable age related cerebral atrophy, ventriculomegaly and periventricular white matter disease. No acute intracranial findings. Electronically Signed   By: Marijo Sanes M.D.   On: 02/20/2021 15:11   CT Cervical Spine Wo Contrast  Result Date: 02/20/2021 CLINICAL DATA:  Trauma.  Status post fall. EXAM: CT CERVICAL SPINE WITHOUT CONTRAST TECHNIQUE: Multidetector CT imaging of the cervical spine was performed without intravenous contrast.  Multiplanar CT image reconstructions were also generated. COMPARISON:  06/02/2014 FINDINGS: Alignment: The alignment of the cervical spine is normal. Skull base and vertebrae: The vertebral body heights are well preserved no acute fracture or dislocation identified. Soft tissues and spinal canal: No prevertebral fluid or swelling. No visible canal hematoma. Disc levels: Multilevel disc space narrowing and endplate spurring is noted throughout the cervical spine. This is most severe at C4-5 through C6-7. Upper chest: Negative. Other: None IMPRESSION: 1. No evidence for cervical spine fracture. 2. Cervical degenerative disc disease. Electronically Signed   By: Kerby Moors M.D.   On: 02/20/2021 15:14   US RENAL  Result Date: 02/22/2021 CLINICAL DATA:  Acute kidney injury, history COPD, diabetes mellitus, hypertension EXAM: RENAL / URINARY TRACT ULTRASOUND COMPLETE COMPARISON:  03/02/2019 FINDINGS: Right Kidney: Renal measurements: 13.5 x 6.5 x 7.6 cm (volume = 350 cm^3). Normal cortical thickness. Increased cortical echogenicity. Multiple cysts, largest 4.6 x 4.3 x 3.9 cm containing a partial septation. Greater than 10 cysts present. No hydronephrosis or shadowing calcification. Left Kidney: Renal measurements: 13.1 x 5.7 x 4.9 cm (volume = 190 cm^3). Normal cortical thickness. Increased cortical echogenicity. Numerous cysts, greater than 10. Largest cyst located at medial kidney 5.1 x 4.3 x 5.8 cm. Additional mid renal cyst 4.1 x 4.3 x 4.5 cm. No solid mass or hydronephrosis. Bladder: Decompressed by Foley catheter. Bladder wall appears thickened but this may be an artifact related underdistention. Other: N/A IMPRESSION: Medical renal disease changes of both kidneys. Multiple BILATERAL renal cysts without hydronephrosis. Electronically Signed   By: Lavonia Dana M.D.   On: 02/22/2021 12:09   ECHOCARDIOGRAM COMPLETE  Result Date: 02/21/2021    ECHOCARDIOGRAM REPORT   Patient Name:   KONSTANTIN LEHNEN Date of  Exam: 02/21/2021 Medical Rec #:  [id number]         Height:       70.0 in Accession #:    [id number]        Weight:       213.0 lb Date of Birth:  1935/11/25         BSA:          2.144 m Patient Age:    85 years          BP:           162/86 mmHg Patient Gender: M                 HR:           99 bpm. Exam Location:  Inpatient Procedure: 2D Echo, Cardiac Doppler and Color Doppler Indications:    Syncope  History:        Patient has prior history of Echocardiogram examinations, most                 recent 10/08/2015. CHF, COPD, Arrythmias:Atrial Fibrillation,                 Signs/Symptoms:Syncope; Risk  Factors:Diabetes and Hypertension.  Sonographer:    Wenda Low Referring Phys: [id number] [facility]  Sonographer Comments: Image acquisition challenging due to respiratory motion. IMPRESSIONS  1. Left ventricular ejection fraction, by estimation, is 55 to 60%. The left ventricle has normal function. The left ventricle has no regional wall motion abnormalities. There is moderate asymmetric left ventricular hypertrophy of the basal-septal segment. Left ventricular diastolic parameters are indeterminate.  2. Right ventricular systolic function is normal. The right ventricular size is normal. There is moderately elevated pulmonary artery systolic pressure.  3. Left atrial size was moderately dilated.  4. The mitral valve is normal in structure. Mild mitral valve regurgitation. No evidence of mitral stenosis.  5. Tricuspid valve regurgitation is mild to moderate.  6. The aortic valve is normal in structure. Aortic valve regurgitation is mild. Aortic valve sclerosis/calcification is present, without any evidence of aortic stenosis.  7. The inferior vena cava is normal in size with greater than 50% respiratory variability, suggesting right atrial pressure of 3 mmHg. Comparison(s): A prior study was performed on 08/08/2020. Echo done with [facility] (08/08/2020), Moderate pericardial effusion had resolved.  FINDINGS  Left Ventricle: Left ventricular ejection fraction, by estimation, is 55 to 60%. The left ventricle has normal function. The left ventricle has no regional wall motion abnormalities. The left ventricular internal cavity size was normal in size. There is  moderate asymmetric left ventricular hypertrophy of the basal-septal segment. Left ventricular diastolic parameters are indeterminate. Right Ventricle: The right ventricular size is normal. No increase in right ventricular wall thickness. Right ventricular systolic function is normal. There is moderately elevated pulmonary artery systolic pressure. The tricuspid regurgitant velocity is 3.30 m/s, and with an assumed right atrial pressure of 8 mmHg, the estimated right ventricular systolic pressure is 32.9 mmHg. Left Atrium: Left atrial size was moderately dilated. Right Atrium: Right atrial size was normal in size. Pericardium: There is no evidence of pericardial effusion. Mitral Valve: The mitral valve is normal in structure. Mild to moderate mitral annular calcification. Mild mitral valve regurgitation. No evidence of mitral valve stenosis. MV peak gradient, 4.7 mmHg. The mean mitral valve gradient is 2.0 mmHg. Tricuspid Valve: The tricuspid valve is normal in structure. Tricuspid valve regurgitation is mild to moderate. No evidence of tricuspid stenosis. Aortic Valve: The aortic valve is normal in structure. There is mild aortic valve annular calcification. Aortic valve regurgitation is mild. Aortic valve sclerosis/calcification is present, without any evidence of aortic stenosis. Aortic valve mean gradient measures 5.0 mmHg. Aortic valve peak gradient measures 8.8 mmHg. Aortic valve area, by VTI measures 1.71 cm. Pulmonic Valve: The pulmonic valve was normal in structure. Pulmonic valve regurgitation is trivial. No evidence of pulmonic stenosis. Aorta: The aortic root is normal in size and structure. Venous: The inferior vena cava is normal in size  with greater than 50% respiratory variability, suggesting right atrial pressure of 3 mmHg. IAS/Shunts: No atrial level shunt detected by color flow Doppler.  LEFT VENTRICLE PLAX 2D LVIDd:         4.70 cm   Diastology LVIDs:         2.90 cm   LV e' medial:    8.38 cm/s LV PW:         1.10 cm   LV E/e' medial:  11.9 LV IVS:        1.40 cm   LV e' lateral:   10.90 cm/s LVOT diam:     1.90 cm   LV E/e' lateral: 9.2  LV SV:         50 LV SV Index:   23 LVOT Area:     2.84 cm  RIGHT VENTRICLE RV Basal diam:  3.50 cm RV Mid diam:    2.90 cm RV S prime:     14.80 cm/s TAPSE (M-mode): 1.9 cm LEFT ATRIUM              Index        RIGHT ATRIUM           Index LA diam:        4.90 cm  2.29 cm/m   RA Area:     21.50 cm LA Vol (A2C):   76.0 ml  35.45 ml/m  RA Volume:   63.00 ml  29.39 ml/m LA Vol (A4C):   103.0 ml 48.04 ml/m LA Biplane Vol: 96.1 ml  44.82 ml/m  AORTIC VALVE                    PULMONIC VALVE AV Area (Vmax):    1.86 cm     PV Vmax:       1.03 m/s AV Area (Vmean):   1.68 cm     PV Peak grad:  4.2 mmHg AV Area (VTI):     1.71 cm AV Vmax:           148.00 cm/s AV Vmean:          98.400 cm/s AV VTI:            0.294 m AV Peak Grad:      8.8 mmHg AV Mean Grad:      5.0 mmHg LVOT Vmax:         97.00 cm/s LVOT Vmean:        58.200 cm/s LVOT VTI:          0.178 m LVOT/AV VTI ratio: 0.60  AORTA Ao Root diam: 3.00 cm Ao Asc diam:  2.90 cm MITRAL VALVE               TRICUSPID VALVE MV Area (PHT): 4.33 cm    TR Peak grad:   43.6 mmHg MV Area VTI:   2.44 cm    TR Vmax:        330.00 cm/s MV Peak grad:  4.7 mmHg MV Mean grad:  2.0 mmHg    SHUNTS MV Vmax:       1.08 m/s    Systemic VTI:  0.18 m MV Vmean:      56.6 cm/s   Systemic Diam: 1.90 cm MV Decel Time: 175 msec MV E velocity: 99.80 cm/s Kardie Tobb DO Electronically signed by Berniece Salines DO Signature Date/Time: 02/21/2021/11:32:50 AM    Final      Subjective: Denies dyspnea, feeling ok.    Discharge Exam: Vitals:   02/26/21 0857 02/26/21 1101  BP: (!)  175/92 (!) 144/83  Pulse: 68 71  Resp:  20  Temp:  (!) 97.3 F (36.3 C)  SpO2: 95% 99%     General: Pt is alert, awake, not in acute distress Cardiovascular: RRR, S1/S2 +, no rubs, no gallops Respiratory: CTA bilaterally, no wheezing, no rhonchi Abdominal: Soft, NT, ND, bowel sounds + Extremities: no edema, no cyanosis    The results of significant diagnostics from this hospitalization (including imaging, microbiology, ancillary and laboratory) are listed below for reference.     Microbiology: Recent Results (from the past 240 hour(s))  Resp Panel by RT-PCR (Flu A&B, Covid)  Nasopharyngeal Swab     Status: None   Collection Time: 02/20/21  3:14 PM   Specimen: Nasopharyngeal Swab; Nasopharyngeal(NP) swabs in vial transport medium  Result Value Ref Range Status   SARS Coronavirus 2 by RT PCR NEGATIVE NEGATIVE Final    Comment: (NOTE) SARS-CoV-2 target nucleic acids are NOT DETECTED.  The SARS-CoV-2 RNA is generally detectable in upper respiratory specimens during the acute phase of infection. The lowest concentration of SARS-CoV-2 viral copies this assay can detect is 138 copies/mL. A negative result does not preclude SARS-Cov-2 infection and should not be used as the sole basis for treatment or other patient management decisions. A negative result may occur with  improper specimen collection/handling, submission of specimen other than nasopharyngeal swab, presence of viral mutation(s) within the areas targeted by this assay, and inadequate number of viral copies(<138 copies/mL). A negative result must be combined with clinical observations, patient history, and epidemiological information. The expected result is Negative.  Fact Sheet for Patients:  EntrepreneurPulse.com.au  Fact Sheet for Healthcare Providers:  IncredibleEmployment.be  This test is no t yet approved or cleared by the [place] FDA and  has been authorized for  detection and/or diagnosis of SARS-CoV-2 by FDA under an Emergency Use Authorization (EUA). This EUA will remain  in effect (meaning this test can be used) for the duration of the COVID-19 declaration under Section 564(b)(1) of the Act, 21 U.S.C.section 360bbb-3(b)(1), unless the authorization is terminated  or revoked sooner.       Influenza A by PCR NEGATIVE NEGATIVE Final   Influenza B by PCR NEGATIVE NEGATIVE Final    Comment: (NOTE) The Xpert Xpress SARS-CoV-2/FLU/RSV plus assay is intended as an aid in the diagnosis of influenza from Nasopharyngeal swab specimens and should not be used as a sole basis for treatment. Nasal washings and aspirates are unacceptable for Xpert Xpress SARS-CoV-2/FLU/RSV testing.  Fact Sheet for Patients: EntrepreneurPulse.com.au  Fact Sheet for Healthcare Providers: IncredibleEmployment.be  This test is not yet approved or cleared by the [place] FDA and has been authorized for detection and/or diagnosis of SARS-CoV-2 by FDA under an Emergency Use Authorization (EUA). This EUA will remain in effect (meaning this test can be used) for the duration of the COVID-19 declaration under Section 564(b)(1) of the Act, 21 U.S.C. section 360bbb-3(b)(1), unless the authorization is terminated or revoked.  Performed at [facility], [place] [address] [place], [place] [address]   Urine Culture     Status: Abnormal   Collection Time: 02/22/21 10:41 AM   Specimen: Urine, Clean Catch  Result Value Ref Range Status   Specimen Description URINE, CLEAN CATCH  Final   Special Requests   Final    NONE Performed at [facility], [place] [address] [place],  [address]    Culture >=100,000 COLONIES/mL STAPHYLOCOCCUS EPIDERMIDIS (A)  Final   Report Status 02/25/2021 FINAL  Final   Organism ID, Bacteria STAPHYLOCOCCUS EPIDERMIDIS (A)  Final      Susceptibility   Staphylococcus epidermidis - MIC*     CIPROFLOXACIN >=8 RESISTANT Resistant     GENTAMICIN <=0.5 SENSITIVE Sensitive     NITROFURANTOIN <=16 SENSITIVE Sensitive     OXACILLIN 0.5 RESISTANT Resistant     TETRACYCLINE 2 SENSITIVE Sensitive     VANCOMYCIN 1 SENSITIVE Sensitive     TRIMETH/SULFA 80 RESISTANT Resistant     CLINDAMYCIN <=0.25 SENSITIVE Sensitive     RIFAMPIN <=0.5 SENSITIVE Sensitive     Inducible Clindamycin NEGATIVE Sensitive     * >=  100,000 COLONIES/mL STAPHYLOCOCCUS EPIDERMIDIS  Resp Panel by RT-PCR (Flu A&B, Covid) Nasopharyngeal Swab     Status: None   Collection Time: 02/24/21 11:30 AM   Specimen: Nasopharyngeal Swab; Nasopharyngeal(NP) swabs in vial transport medium  Result Value Ref Range Status   SARS Coronavirus 2 by RT PCR NEGATIVE NEGATIVE Final    Comment: (NOTE) SARS-CoV-2 target nucleic acids are NOT DETECTED.  The SARS-CoV-2 RNA is generally detectable in upper respiratory specimens during the acute phase of infection. The lowest concentration of SARS-CoV-2 viral copies this assay can detect is 138 copies/mL. A negative result does not preclude SARS-Cov-2 infection and should not be used as the sole basis for treatment or other patient management decisions. A negative result may occur with  improper specimen collection/handling, submission of specimen other than nasopharyngeal swab, presence of viral mutation(s) within the areas targeted by this assay, and inadequate number of viral copies(<138 copies/mL). A negative result must be combined with clinical observations, patient history, and epidemiological information. The expected result is Negative.  Fact Sheet for Patients:  EntrepreneurPulse.com.au  Fact Sheet for Healthcare Providers:  IncredibleEmployment.be  This test is no t yet approved or cleared by the [place] FDA and  has been authorized for detection and/or diagnosis of SARS-CoV-2 by FDA under an Emergency Use Authorization (EUA). This  EUA will remain  in effect (meaning this test can be used) for the duration of the COVID-19 declaration under Section 564(b)(1) of the Act, 21 U.S.C.section 360bbb-3(b)(1), unless the authorization is terminated  or revoked sooner.       Influenza A by PCR NEGATIVE NEGATIVE Final   Influenza B by PCR NEGATIVE NEGATIVE Final    Comment: (NOTE) The Xpert Xpress SARS-CoV-2/FLU/RSV plus assay is intended as an aid in the diagnosis of influenza from Nasopharyngeal swab specimens and should not be used as a sole basis for treatment. Nasal washings and aspirates are unacceptable for Xpert Xpress SARS-CoV-2/FLU/RSV testing.  Fact Sheet for Patients: EntrepreneurPulse.com.au  Fact Sheet for Healthcare Providers: IncredibleEmployment.be  This test is not yet approved or cleared by the [place] FDA and has been authorized for detection and/or diagnosis of SARS-CoV-2 by FDA under an Emergency Use Authorization (EUA). This EUA will remain in effect (meaning this test can be used) for the duration of the COVID-19 declaration under Section 564(b)(1) of the Act, 21 U.S.C. section 360bbb-3(b)(1), unless the authorization is terminated or revoked.  Performed at [facility], [place] [address] [place], [place] [address]      Labs: BNP (last 3 results) No results for input(s): BNP in the last 8760 hours. Basic Metabolic Panel: Recent Labs  Lab 02/20/21 1312 02/21/21 0326 02/22/21 0045 02/23/21 0027 02/24/21 0323 02/25/21 0540  NA 139 138 137 137 138 138  K 3.0* 2.8* 3.6 3.7 3.7 3.9  CL 103 106 106 109 107 106  CO2 26 24 22 22 22 22   GLUCOSE 134* 100* 159* 100* 96 84  BUN 66* 61* 63* 62* 57* 54*  CREATININE 3.08* 2.88* 3.17* 3.52* 3.39* 3.38*  CALCIUM 9.5 9.0 8.7* 8.3* 8.5* 8.5*  MG 1.7  --   --   --   --   --   PHOS  --   --   --   --  2.6  --     Liver Function Tests: Recent Labs  Lab 02/20/21 1312 02/21/21 0326 02/24/21 0323   AST 59* 37  --   ALT 54* 42  --   ALKPHOS  87 70  --   BILITOT 1.4* 1.4*  --   PROT 5.1* 4.6*  --   ALBUMIN 2.6* 2.2* 2.8*    No results for input(s): LIPASE, AMYLASE in the last 168 hours. No results for input(s): AMMONIA in the last 168 hours. CBC: Recent Labs  Lab 02/20/21 1312 02/21/21 0326 02/22/21 0045 02/23/21 0027 02/24/21 0323  WBC 15.1* 11.0* 10.5 9.0 7.0  HGB 12.7* 11.9* 10.3* 9.7* 9.4*  HCT 39.5 36.0* 32.7* 30.2* 29.5*  MCV 92.7 92.5 95.9 93.8 93.4  PLT 188 161 154 147* 139*    Cardiac Enzymes: Recent Labs  Lab 02/20/21 1312  CKTOTAL 496*    BNP: Invalid input(s): POCBNP CBG: Recent Labs  Lab 02/25/21 1148 02/25/21 1608 02/25/21 2103 02/26/21 0612 02/26/21 1103  GLUCAP 130* 115* 122* 85 112*    D-Dimer No results for input(s): DDIMER in the last 72 hours. Hgb A1c No results for input(s): HGBA1C in the last 72 hours. Lipid Profile No results for input(s): CHOL, HDL, LDLCALC, TRIG, CHOLHDL, LDLDIRECT in the last 72 hours. Thyroid function studies No results for input(s): TSH, T4TOTAL, T3FREE, THYROIDAB in the last 72 hours.  Invalid input(s): FREET3 Anemia work up Recent Labs    02/24/21 0323  FERRITIN 67  TIBC 193*  IRON 34*    Urinalysis    Component Value Date/Time   COLORURINE YELLOW 02/21/2021 2328   APPEARANCEUR CLOUDY (A) 02/21/2021 2328   LABSPEC 1.020 02/21/2021 2328   PHURINE 6.0 02/21/2021 [place] 02/21/2021 2328   HGBUR MODERATE (A) 02/21/2021 2328   BILIRUBINUR NEGATIVE 02/21/2021 2328   [place] 02/21/2021 2328   PROTEINUR >300 (A) 02/21/2021 2328   UROBILINOGEN 0.2 09/26/2012 1222   NITRITE NEGATIVE 02/21/2021 2328   LEUKOCYTESUR TRACE (A) 02/21/2021 2328   Sepsis Labs Invalid input(s): PROCALCITONIN,  WBC,  LACTICIDVEN Microbiology Recent Results (from the past 240 hour(s))  Resp Panel by RT-PCR (Flu A&B, Covid) Nasopharyngeal Swab     Status: None   Collection Time: 02/20/21   3:14 PM   Specimen: Nasopharyngeal Swab; Nasopharyngeal(NP) swabs in vial transport medium  Result Value Ref Range Status   SARS Coronavirus 2 by RT PCR NEGATIVE NEGATIVE Final    Comment: (NOTE) SARS-CoV-2 target nucleic acids are NOT DETECTED.  The SARS-CoV-2 RNA is generally detectable in upper respiratory specimens during the acute phase of infection. The lowest concentration of SARS-CoV-2 viral copies this assay can detect is 138 copies/mL. A negative result does not preclude SARS-Cov-2 infection and should not be used as the sole basis for treatment or other patient management decisions. A negative result may occur with  improper specimen collection/handling, submission of specimen other than nasopharyngeal swab, presence of viral mutation(s) within the areas targeted by this assay, and inadequate number of viral copies(<138 copies/mL). A negative result must be combined with clinical observations, patient history, and epidemiological information. The expected result is Negative.  Fact Sheet for Patients:  EntrepreneurPulse.com.au  Fact Sheet for Healthcare Providers:  IncredibleEmployment.be  This test is no t yet approved or cleared by the [place] FDA and  has been authorized for detection and/or diagnosis of SARS-CoV-2 by FDA under an Emergency Use Authorization (EUA). This EUA will remain  in effect (meaning this test can be used) for the duration of the COVID-19 declaration under Section 564(b)(1) of the Act, 21 U.S.C.section 360bbb-3(b)(1), unless the authorization is terminated  or revoked sooner.       Influenza A by PCR NEGATIVE  NEGATIVE Final   Influenza B by PCR NEGATIVE NEGATIVE Final    Comment: (NOTE) The Xpert Xpress SARS-CoV-2/FLU/RSV plus assay is intended as an aid in the diagnosis of influenza from Nasopharyngeal swab specimens and should not be used as a sole basis for treatment. Nasal washings and aspirates  are unacceptable for Xpert Xpress SARS-CoV-2/FLU/RSV testing.  Fact Sheet for Patients: EntrepreneurPulse.com.au  Fact Sheet for Healthcare Providers: IncredibleEmployment.be  This test is not yet approved or cleared by the [place] FDA and has been authorized for detection and/or diagnosis of SARS-CoV-2 by FDA under an Emergency Use Authorization (EUA). This EUA will remain in effect (meaning this test can be used) for the duration of the COVID-19 declaration under Section 564(b)(1) of the Act, 21 U.S.C. section 360bbb-3(b)(1), unless the authorization is terminated or revoked.  Performed at [facility], [place] [address], [place], [place] [address]   Urine Culture     Status: Abnormal   Collection Time: 02/22/21 10:41 AM   Specimen: Urine, Clean Catch  Result Value Ref Range Status   Specimen Description URINE, CLEAN CATCH  Final   Special Requests   Final    NONE Performed at [facility], [place] [address], [place], [place] [address]    Culture >=100,000 COLONIES/mL STAPHYLOCOCCUS EPIDERMIDIS (A)  Final   Report Status 02/25/2021 FINAL  Final   Organism ID, Bacteria STAPHYLOCOCCUS EPIDERMIDIS (A)  Final      Susceptibility   Staphylococcus epidermidis - MIC*    CIPROFLOXACIN >=8 RESISTANT Resistant     GENTAMICIN <=0.5 SENSITIVE Sensitive     NITROFURANTOIN <=16 SENSITIVE Sensitive     OXACILLIN 0.5 RESISTANT Resistant     TETRACYCLINE 2 SENSITIVE Sensitive     VANCOMYCIN 1 SENSITIVE Sensitive     TRIMETH/SULFA 80 RESISTANT Resistant     CLINDAMYCIN <=0.25 SENSITIVE Sensitive     RIFAMPIN <=0.5 SENSITIVE Sensitive     Inducible Clindamycin NEGATIVE Sensitive     * >=100,000 COLONIES/mL STAPHYLOCOCCUS EPIDERMIDIS  Resp Panel by RT-PCR (Flu A&B, Covid) Nasopharyngeal Swab     Status: None   Collection Time: 02/24/21 11:30 AM   Specimen: Nasopharyngeal Swab; Nasopharyngeal(NP) swabs in vial transport medium  Result Value  Ref Range Status   SARS Coronavirus 2 by RT PCR NEGATIVE NEGATIVE Final    Comment: (NOTE) SARS-CoV-2 target nucleic acids are NOT DETECTED.  The SARS-CoV-2 RNA is generally detectable in upper respiratory specimens during the acute phase of infection. The lowest concentration of SARS-CoV-2 viral copies this assay can detect is 138 copies/mL. A negative result does not preclude SARS-Cov-2 infection and should not be used as the sole basis for treatment or other patient management decisions. A negative result may occur with  improper specimen collection/handling, submission of specimen other than nasopharyngeal swab, presence of viral mutation(s) within the areas targeted by this assay, and inadequate number of viral copies(<138 copies/mL). A negative result must be combined with clinical observations, patient history, and epidemiological information. The expected result is Negative.  Fact Sheet for Patients:  EntrepreneurPulse.com.au  Fact Sheet for Healthcare Providers:  IncredibleEmployment.be  This test is no t yet approved or cleared by the [place] FDA and  has been authorized for detection and/or diagnosis of SARS-CoV-2 by FDA under an Emergency Use Authorization (EUA). This EUA will remain  in effect (meaning this test can be used) for the duration of the COVID-19 declaration under Section 564(b)(1) of the Act, 21 U.S.C.section 360bbb-3(b)(1), unless the authorization is  terminated  or revoked sooner.       Influenza A by PCR NEGATIVE NEGATIVE Final   Influenza B by PCR NEGATIVE NEGATIVE Final    Comment: (NOTE) The Xpert Xpress SARS-CoV-2/FLU/RSV plus assay is intended as an aid in the diagnosis of influenza from Nasopharyngeal swab specimens and should not be used as a sole basis for treatment. Nasal washings and aspirates are unacceptable for Xpert Xpress SARS-CoV-2/FLU/RSV testing.  Fact Sheet for  Patients: EntrepreneurPulse.com.au  Fact Sheet for Healthcare Providers: IncredibleEmployment.be  This test is not yet approved or cleared by the [place] FDA and has been authorized for detection and/or diagnosis of SARS-CoV-2 by FDA under an Emergency Use Authorization (EUA). This EUA will remain in effect (meaning this test can be used) for the duration of the COVID-19 declaration under Section 564(b)(1) of the Act, 21 U.S.C. section 360bbb-3(b)(1), unless the authorization is terminated or revoked.  Performed at [facility], [place] [address], [place], [place] [address]      Time coordinating discharge: 40 minutes  SIGNED:   Elmarie Shiley, MD  [facility]

## 2021-02-26 NOTE — TOC Transition Note (Addendum)
Transition of Care [facility]) - CM/SW Discharge Note   Patient Details  Name: Roy [name] MRN: [id number] Date of Birth: [birth year]  Transition of [facility]) CM/SW Contact:  Milas Gain, [place] Phone Number: 02/26/2021, 11:45 AM   Clinical Narrative:     Patient will DC to: [facility]  Anticipated DC date: 02/26/2021  Family notified: [name]  Transport by: Corey Harold  ?  Per MD patient ready for DC to [facility]. RN, patient, patient's family, and facility notified of DC. Discharge Summary sent to facility  RN given number for report tele# [phone] RM# 111.Patients son confirmed he will bring Cpap from home to SNF.DC packet on chart. Ambulance transport requested for patient.  CSW signing off.   Final next level of care: Skilled Nursing Facility Barriers to Discharge: No Barriers Identified   Patient Goals and CMS Choice Patient states their goals for this hospitalization and ongoing recovery are:: to go to SNF CMS Medicare.gov Compare Post Acute Care list provided to:: Patient Choice offered to / list presented to : Patient  Discharge Placement              Patient chooses bed at: [facility] Patient to be transferred to facility by: [place] Name of family member notified: [name] Patient and family notified of of transfer: 02/26/21  Discharge Plan and Services In-house Referral: Clinical Social Work   Post Acute Care Choice: [place]                               Social Determinants of Health (SDOH) Interventions     Readmission Risk Interventions No flowsheet data found.

## 2021-02-26 NOTE — Progress Notes (Signed)
Mobility Specialist Progress Note:   02/26/21 1436  Mobility  Activity Ambulated in room  Level of Assistance Moderate assist, patient does 50-74%  Assistive Device Four wheel walker  Distance Ambulated (ft) 30 ft  Mobility Ambulated with assistance in room  Mobility Response Tolerated well  Mobility performed by Mobility specialist  Bed Position Chair  $Mobility charge 1 Mobility   Ambulated in room. No complaints of pain. Returned to chair with call bell in reach and all needs met.  [facility] Public librarian Phone [phone] Secondary Phone [phone]

## 2021-02-26 NOTE — Care Management Important Message (Signed)
Important Message  Patient Details  Name: Roy [name] MRN: [id number] Date of Birth: [birth year]   Medicare Important Message Given:  Yes     Shelda Altes 02/26/2021, 10:16 AM

## 2021-02-27 ENCOUNTER — Inpatient Hospital Stay (HOSPITAL_COMMUNITY)
Admission: RE | Admit: 2021-02-27 | Discharge: 2021-02-27 | Disposition: A | Discharge status: Home / Self Care | Payer: Medicare Other | Source: Ambulatory Visit | Attending: Nephrology | Admitting: Nephrology

## 2021-03-06 ENCOUNTER — Encounter (HOSPITAL_COMMUNITY): Payer: Medicare Other

## 2021-03-08 ENCOUNTER — Encounter (HOSPITAL_COMMUNITY): Payer: Medicare Other

## 2021-03-09 ENCOUNTER — Other Ambulatory Visit: Payer: Self-pay

## 2021-03-09 ENCOUNTER — Non-Acute Institutional Stay: Payer: BC Managed Care – PPO | Admitting: Hospice

## 2021-03-09 DIAGNOSIS — R531 Weakness: Secondary | ICD-10-CM

## 2021-03-09 DIAGNOSIS — E1142 Type 2 diabetes mellitus with diabetic polyneuropathy: Secondary | ICD-10-CM

## 2021-03-09 DIAGNOSIS — Z515 Encounter for palliative care: Secondary | ICD-10-CM

## 2021-03-09 DIAGNOSIS — I5032 Chronic diastolic (congestive) heart failure: Secondary | ICD-10-CM

## 2021-03-09 NOTE — Progress Notes (Addendum)
[facility] Consult Note Telephone: [phone]  Fax: [phone]  PATIENT NAME: Roy [name] [address] [address] [phone] (home)  DOB: 10/14/1935 MRN: [id number]  PRIMARY CARE PROVIDER:    Rogers Blocker, MD,  [place] [address] [phone]  REFERRING PROVIDER:   Martinique Miller NP  RESPONSIBLE PARTY:   Self Contact Information     Name Relation Home Work Mobile   [place] Son   [phone]   [place], [place]   [phone]        I met face to face with patient at facility. Palliative Care was asked to follow this patient by consultation request of  Martinique Miller NP to address advance care planning, complex medical decision making and goals of care clarification.  Patient endorses palliative service.  NP called [name] and left him a voicemail with callback number.  He later called back and was updated of visit. He expressed endorsement appreciation for palliative service. This is the initial visit.    ASSESSMENT AND / RECOMMENDATIONS:   Advance Care Planning: Our advance care planning conversation included a discussion about:    The value and importance of advance care planning  Difference between Hospice and Palliative care Exploration of goals of care in the event of a sudden injury or illness  Identification and preparation of a healthcare agent  Review and updating or creation of an  advance directive document . Decision not to resuscitate or to de-escalate disease focused treatments due to poor prognosis.  CODE STATUS: Patient affirmed he is a Do Not Resuscitate  Goals of Care: Goals include to maximize quality of life and symptom management  I spent  20 minutes providing this initial consultation. More than 50% of the time in this consultation was spent on counseling patient and coordinating  communication. --------------------------------------------------------------------------------------------------------------------------------------  Symptom Management/Plan: Weakness: Related to recent hospitalization for syncope/collapse.  PT/OT is ongoing for strengthening and gait training; falls precautions reiterated Chronic diastolic heart failure: Continue Torsemide as ordered. Encouraged and demonstrated elevation of BLE. Recommendation: reduced salt/fluid - 1500 mg/1500cc/day.  Type 2 DM with polyneuropathy: Diabetes under control, currently with diet, Continue Gabapentin. Current A1c 02/20/21 is 5.5. Repeat A1c in 3-6 months.  Follow up: Palliative care will continue to follow for complex medical decision making, advance care planning, and clarification of goals. Return 6 weeks or prn. Encouraged to call provider sooner with any concerns.   Family /Caregiver/Community Supports: Patient is in SNF for ongoing care. The plan is for patient to discharge home and [name] said arrangements are in process for caregivers at home.   HOSPICE ELIGIBILITY/DIAGNOSIS: TBD  Chief Complaint: Initial Palliative care visit  HISTORY OF PRESENT ILLNESS:  Roy [name] is a 85 y.o. year old male  with multiple medical conditions including weakness, worsening following recent hospitalization for syncope/collapse.  Weakness is worse as the day progresses, impairs his ADLs.  He reports ongoing PT OT is helpful.  History of Afib, HTN, type 2 diabetes mellitus, fall, syncope.  Patient denies pain/discomfort, endorses weakness.  Nursing reports he is compliant with plan of care; no concerns at this time. History obtained from review of EMR, discussion with primary team, caregiver, family and/or Roy [name].  Review and summarization of Epic records shows history from other than patient. Rest of 10 point ROS asked and negative.  I reviewed as needed, available labs, patient records, imaging, studies and related  documents from  the EMR.  ROS General: NAD EYES: denies vision changes ENMT: denies dysphagia Cardiovascular: denies chest pain/discomfort Pulmonary: denies cough, denies SOB Abdomen: endorses good appetite, denies constipation/diarrhea GU: denies dysuria, urinary frequency MSK:  endorses weakness,  no falls reported Skin: denies rashes or wounds Neurological: denies pain, denies insomnia Psych: Endorses positive mood Heme/lymph/immuno: denies bruises, abnormal bleeding  Physical Exam: Constitutional: NAD General: Well groomed, cooperative EYES: anicteric sclera, lids intact, no discharge  ENMT: Moist mucous membrane CV: S1 S2, RRR,1+ pitting edema in BLE Pulmonary: LCTA, no increased work of breathing, no cough, Abdomen: active BS + 4 quadrants, soft and non tender GU: no suprapubic tenderness MSK: weakness, limited ROM Skin: warm and dry, no rashes or wounds on visible skin Neuro:  weakness, otherwise non focal Psych: non-anxious affect Hem/lymph/immuno: no widespread bruising   PAST MEDICAL HISTORY:  Active Ambulatory Problems    Diagnosis Date Noted   DM2 (diabetes mellitus, type 2) ([place]) 01/06/2007   EXOGENOUS OBESITY 01/06/2007   OSA on CPAP 01/06/2007   Essential hypertension 01/06/2007   Chronic obstructive airway disease with asthma ([place]) 01/06/2007   CKD (chronic kidney disease) stage 3, GFR 30-59 ml/min (HCC) 10/06/2015   Sepsis secondary to UTI ([place]) 10/06/2015   UTI (lower urinary tract infection) 10/06/2015   Bacteremia due to Enterococcus 10/09/2015   Acute deep vein thrombosis (DVT) of femoral vein of left lower extremity (HCC)    Abscess of external ear, left 01/11/2016   Infected cyst of skin 01/25/2016   Leg edema 11/18/2019   Exertional dyspnea 11/18/2019   Pulmonary hypertension, unspecified ([place]) 12/17/2019   Persistent atrial fibrillation ([place]) 12/17/2019   Benign prostatic hyperplasia with urinary obstruction 07/24/2015   Syncope and collapse  02/20/2021   Hypokalemia 02/20/2021   Elevated LFTs 02/20/2021   Leukocytosis 02/20/2021   Acute renal failure superimposed on stage 3 chronic kidney disease ([place]) 02/20/2021   Chronic diastolic CHF (congestive heart failure) ([place]) 02/20/2021   Resolved Ambulatory Problems    Diagnosis Date Noted   AKI (acute kidney injury) ([place]) 10/06/2015   Past Medical History:  Diagnosis Date   A-fib ([place])    Arthritis HANDS/ THUMBS/ SHOULDERS   Bladder calculi    Chronic obstructive asthma, unspecified FOLLOWED BY DR Tarri Fuller YOUNG  VISIT 10-14-2011 IN EPIC   COPD (chronic obstructive pulmonary disease) (HCC)    Cyst on ear    Diabetes mellitus    DVT (deep venous thrombosis) (HCC)    Dyspnea on exertion    GERD (gastroesophageal reflux disease)    Hematuria    History of prostate cancer 2010-- S/P  EXTERNAL RADIATION   Hydronephrosis, left    Hypertension    Sepsis ([place])    Wears glasses     SOCIAL HX:  Social History   Tobacco Use   Smoking status: Former    Packs/day: 0.50    Years: 25.00    Pack years: 12.50    Types: Cigarettes    Quit date: 03/25/1998    Years since quitting: 22.9   Smokeless tobacco: Never  Substance Use Topics   Alcohol use: Not Currently     FAMILY HX:  Family History  Problem Relation Age of Onset   Diabetes Father       ALLERGIES: No Known Allergies    PERTINENT MEDICATIONS:  Outpatient Encounter Medications as of 03/09/2021  Medication Sig   cinacalcet (SENSIPAR) 30 MG tablet Take 30 mg by mouth daily.   CONTOUR NEXT TEST test strip daily.  diltiazem (CARDIZEM CD) 240 MG 24 hr capsule Take 240 mg by mouth daily.   ELIQUIS 2.5 MG TABS tablet Take 2.5 mg by mouth 2 (two) times daily.   finasteride (PROSCAR) 5 MG tablet Take 5 mg by mouth daily.   Fluticasone-Umeclidin-Vilant (TRELEGY ELLIPTA) 100-62.5-25 MCG/INH AEPB Inhale 1 puff into the lungs daily.   gabapentin (NEURONTIN) 100 MG capsule Take 100 mg by mouth daily.   isosorbide  dinitrate (ISORDIL) 30 MG tablet TAKE 1 TABLET BY MOUTH THREE TIMES DAILY (Patient taking differently: Take 30 mg by mouth 3 (three) times daily.)   labetalol (NORMODYNE) 100 MG tablet Take 1 tablet (100 mg total) by mouth 2 (two) times daily.   Microlet Lancets MISC    pantoprazole (PROTONIX) 40 MG tablet Take 40 mg by mouth daily.   tamsulosin (FLOMAX) 0.4 MG CAPS capsule Take 1 capsule (0.4 mg total) by mouth daily.   torsemide (DEMADEX) 100 MG tablet Take 0.5 tablets (50 mg total) by mouth daily.   zaleplon (SONATA) 5 MG capsule Take 1 capsule (5 mg total) by mouth at bedtime as needed for sleep.   No facility-administered encounter medications on file as of 03/09/2021.     Thank you for the opportunity to participate in the care of Roy [name].  The palliative care team will continue to follow. Please call our office at [phone] if we can be of additional assistance.   Note: Portions of this note were generated with Lobbyist. Dictation errors may occur despite best attempts at proofreading.  Teodoro Spray, NP

## 2021-03-15 ENCOUNTER — Encounter (HOSPITAL_COMMUNITY): Payer: Medicare Other

## 2021-03-16 ENCOUNTER — Inpatient Hospital Stay (HOSPITAL_COMMUNITY): Admission: RE | Admit: 2021-03-16 | Payer: Medicare Other | Source: Ambulatory Visit

## 2021-03-22 ENCOUNTER — Ambulatory Visit: Payer: Medicare Other | Admitting: Cardiology

## 2021-03-23 ENCOUNTER — Inpatient Hospital Stay (HOSPITAL_COMMUNITY): Admission: RE | Admit: 2021-03-23 | Payer: Medicare Other | Source: Ambulatory Visit

## 2021-03-30 ENCOUNTER — Encounter (HOSPITAL_COMMUNITY): Payer: Medicare Other

## 2021-04-11 ENCOUNTER — Encounter (HOSPITAL_COMMUNITY): Payer: Self-pay

## 2021-04-18 ENCOUNTER — Ambulatory Visit: Payer: BC Managed Care – PPO | Admitting: Cardiology

## 2021-04-18 ENCOUNTER — Encounter: Payer: Self-pay | Admitting: Cardiology

## 2021-04-18 ENCOUNTER — Other Ambulatory Visit: Payer: Self-pay

## 2021-04-18 VITALS — BP 130/70 | HR 77 | Temp 98.0°F | Resp 16 | Ht 70.0 in | Wt 206.0 lb

## 2021-04-18 DIAGNOSIS — G4733 Obstructive sleep apnea (adult) (pediatric): Secondary | ICD-10-CM

## 2021-04-18 DIAGNOSIS — I272 Pulmonary hypertension, unspecified: Secondary | ICD-10-CM

## 2021-04-18 DIAGNOSIS — I4819 Other persistent atrial fibrillation: Secondary | ICD-10-CM

## 2021-04-18 DIAGNOSIS — I10 Essential (primary) hypertension: Secondary | ICD-10-CM

## 2021-04-18 NOTE — Progress Notes (Signed)
Patient referred by Rogers Blocker, MD for exertional dyspnea, leg swelling.  Subjective:   Roy [name], male    DOB: 1936-02-04, 86 y.o.   MRN: [id number]   Chief Complaint  Patient presents with   Atrial Fibrillation   Hypertension   Follow-up    6 Month     HPI  86 y.o. African American male with hypertension, type 2 DM, persistent Afib, OSA, CKD IIIb  Patient was hospitalized in 02/2021 after being found down on floor by his son, unclear if this was syncope or mechanical fall.  Patient was thought to be dehydrated, leading to AKI.  Patient had voiced that he did not want dialysis. He was treated with IV fluids with improvement in his orthostatic hypotension.  He was treated with IV ceftriaxone for suspected UTI given hematuria and pyuria.  Hydralazine was stopped.  He was discharged to SNF with palliative care on board.   He is now back home, living independently. He has home OT and PT support. Patient is here today with his son. He has been feeling much better, denies any complaints today. He has not had any falls since hospital discharge.    Initial consultation HPI 10/2019: Patient is here with his son today.  Patient lives alone, performs all his ADLs without any difficulty.  He seems to be unaware of details of his medical history.  For example, he does not know that he has atrial fibrillation.  He believes he is on Eliquis to help his circulation.  He initially seemed to be unaware of his kidney disease, but then realizes that he is seeing a nephrologist.  He denies any chest pain or shortness of breath with his level of activity, which includes walking inside his one level house. He reports bilateral leg swelling, more towards the end of the day, improves with wearing compression stockings. Blood pressure elevated today.    Current Outpatient Medications on File Prior to Visit  Medication Sig Dispense Refill   cinacalcet (SENSIPAR) 30 MG tablet Take 30 mg by mouth  daily.     CONTOUR NEXT TEST test strip daily.     diltiazem (CARDIZEM CD) 240 MG 24 hr capsule Take 240 mg by mouth daily.     ELIQUIS 2.5 MG TABS tablet Take 2.5 mg by mouth 2 (two) times daily.     finasteride (PROSCAR) 5 MG tablet Take 5 mg by mouth daily.     Fluticasone-Umeclidin-Vilant (TRELEGY ELLIPTA) 100-62.5-25 MCG/INH AEPB Inhale 1 puff into the lungs daily. 14 each 0   gabapentin (NEURONTIN) 100 MG capsule Take 100 mg by mouth daily.     hydrALAZINE (APRESOLINE) 50 MG tablet Take 50 mg by mouth daily.     isosorbide dinitrate (ISORDIL) 30 MG tablet TAKE 1 TABLET BY MOUTH THREE TIMES DAILY (Patient taking differently: Take 30 mg by mouth 3 (three) times daily.) 90 tablet 0   labetalol (NORMODYNE) 100 MG tablet Take 1 tablet (100 mg total) by mouth 2 (two) times daily. 60 tablet 5   methenamine (HIPREX) 1 g tablet Take 1 g by mouth 2 (two) times daily with a meal.     Microlet Lancets MISC      pantoprazole (PROTONIX) 40 MG tablet Take 40 mg by mouth daily.     tamsulosin (FLOMAX) 0.4 MG CAPS capsule Take 1 capsule (0.4 mg total) by mouth daily. 30 capsule 1   torsemide (DEMADEX) 100 MG tablet Take 0.5 tablets (50 mg total) by mouth  daily. 30 tablet 0   zaleplon (SONATA) 5 MG capsule Take 1 capsule (5 mg total) by mouth at bedtime as needed for sleep. 5 capsule 0   No current facility-administered medications on file prior to visit.    Cardiovascular and other pertinent studies:  EKG 04/18/2021: Atrial fibrillation 88 bpm Nonspecific T-abnormality  Echocardiogram 02/21/2021:  1. Left ventricular ejection fraction, by estimation, is 55 to 60%. The  left ventricle has normal function. The left ventricle has no regional  wall motion abnormalities. There is moderate asymmetric left ventricular  hypertrophy of the basal-septal  segment. Left ventricular diastolic parameters are indeterminate.   2. Right ventricular systolic function is normal. The right ventricular  size is  normal. There is moderately elevated pulmonary artery systolic  pressure.   3. Left atrial size was moderately dilated.   4. The mitral valve is normal in structure. Mild mitral valve  regurgitation. No evidence of mitral stenosis.   5. Tricuspid valve regurgitation is mild to moderate.   6. The aortic valve is normal in structure. Aortic valve regurgitation is  mild. Aortic valve sclerosis/calcification is present, without any  evidence of aortic stenosis.   7. The inferior vena cava is normal in size with greater than 50%  respiratory variability, suggesting right atrial pressure of 3 mmHg.   Recent labs: 02/25/2021: Glucose 84, BUN/Cr 54/3.38. EGFR 17. Na/K 138/4.9.  H/H 9.4/29.5. MCV 93. Platelets 139  02/25/2021: 84.  54/3.38. 17.  138/3.9. 9.4/29.5/93/139   11/18/2019: Glucose 98, BUN/Cr 17/1.86. EGFR 38. Na/K 147/5.1.   08/26/2019: Glucose 94, BUN/Cr 17/1.73. EGFR 42. Na/K 147/5.8. Protein, Total: 5.4, Albumin: 3 H/H 10.7/32.9. MCV 93.5. Platelets 171 HbA1C 5.5 % Chol 177, TG 89, HDL 54, LDL 105 BNP 132 NT pro BNP 1057   ROS       Vitals:   04/18/21 1421  BP: 130/70  Pulse: 77  Resp: 16  Temp: 98 F (36.7 C)  SpO2: 97%     Body mass index is 29.56 kg/m. Filed Weights   04/18/21 1421  Weight: 206 lb (93.4 kg)     Objective:   Physical Exam Vitals and nursing note reviewed.  Constitutional:      General: He is not in acute distress. Neck:     Vascular: No JVD.  Cardiovascular:     Rate and Rhythm: Normal rate. Rhythm irregular.     Heart sounds: Normal heart sounds. No murmur heard. Pulmonary:     Effort: Pulmonary effort is normal.     Breath sounds: Normal breath sounds. No wheezing or rales.  Musculoskeletal:     Right lower leg: Edema (1+) present.     Left lower leg: Edema (1+) present.        Assessment & Recommendations:   86 y.o. African American male with hypertension, type 2 DM, persistent Afib, OSA, CKD  IIIb  Hypertension: Well controlled.  Given recent orthostatic hypotension and falls, I have discontinued his hydralazine.   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 Continue medical management of hypertension. Continue OSA for CPAP.   Persistent Afib: No symptoms s/o ischemia. Most likely due to hypertension and OSA,  CHA2DS2VASc score 3, annual stroke risk 3.2%. Continue eliquis 2.5 mg bid.  I had a long discussion with patient and son. They understand the risks and benefits. He is feeling stronger and has not had any falls. Would like to continue eliquis.   F/u in 6 months  [name], MD Pager: [phone] Office: [phone]

## 2021-05-05 ENCOUNTER — Other Ambulatory Visit: Payer: Self-pay | Admitting: Cardiology

## 2021-05-05 DIAGNOSIS — I10 Essential (primary) hypertension: Secondary | ICD-10-CM

## 2021-05-09 ENCOUNTER — Encounter (HOSPITAL_COMMUNITY): Payer: Self-pay

## 2021-05-21 ENCOUNTER — Ambulatory Visit (INDEPENDENT_AMBULATORY_CARE_PROVIDER_SITE_OTHER): Payer: Medicare Other | Admitting: Podiatry

## 2021-05-21 ENCOUNTER — Other Ambulatory Visit: Payer: Self-pay

## 2021-05-21 DIAGNOSIS — E1142 Type 2 diabetes mellitus with diabetic polyneuropathy: Secondary | ICD-10-CM | POA: Diagnosis not present

## 2021-05-21 DIAGNOSIS — M79674 Pain in right toe(s): Secondary | ICD-10-CM

## 2021-05-21 DIAGNOSIS — B351 Tinea unguium: Secondary | ICD-10-CM

## 2021-05-21 DIAGNOSIS — M79675 Pain in left toe(s): Secondary | ICD-10-CM

## 2021-05-25 ENCOUNTER — Ambulatory Visit: Payer: Medicare Other

## 2021-05-25 ENCOUNTER — Other Ambulatory Visit: Payer: Self-pay

## 2021-05-25 DIAGNOSIS — E1142 Type 2 diabetes mellitus with diabetic polyneuropathy: Secondary | ICD-10-CM

## 2021-05-25 DIAGNOSIS — M2141 Flat foot [pes planus] (acquired), right foot: Secondary | ICD-10-CM

## 2021-05-25 NOTE — Progress Notes (Signed)
SITUATION ?Reason for Consult: Evaluation for Prefabricated Diabetic Shoes and Custom Diabetic Inserts. ?Patient / Caregiver Report: Patient would like well fitting shoes ? ?OBJECTIVE DATA: ?Patient History / Diagnosis:  ?  ICD-10-CM   ?1. Diabetic peripheral neuropathy associated with type 2 diabetes mellitus ([place])  E11.42   ?  ?2. Pes planus of both feet  M21.41   ? M21.42   ?  ? ? ?Current or Previous Devices:   Current user, Dr. Gibson Ramp  ? ?In-Person Foot Examination: ?Ulcers & Callousing:   None ? ?Deformities:   ?- Pes Planus ?  ?Shoe Size: 11.5XW ? ?ORTHOTIC RECOMMENDATION ?Recommended Devices: ?- 1x pair prefabricated PDAC approved diabetic shoes; Patient Selected - Orthofeet 510 Black Size 11.5XW ? ?- 3x pair custom-to-patient PDAC approved vacuum formed diabetic insoles. ? ?GOALS OF SHOES AND INSOLES ?- Reduce shear and pressure ?- Reduce / Prevent callus formation ?- Reduce / Prevent ulceration ?- Protect the fragile healing compromised diabetic foot. ? ?Patient would benefit from diabetic shoes and inserts as patient has diabetes mellitus and the patient has one or more of the following conditions: ?- Peripheral neuropathy with evidence of callus formation ?- Foot deformity ?- Poor circulation ? ?ACTIONS PERFORMED ?Patient was casted for insoles via crush box and measured for shoes via brannock device. Procedure was explained and patient tolerated procedure well. All questions were answered and concerns addressed. ? ?PLAN ?Patient is to be contacted and scheduled for fitting once CMN is obtained from treaing physician and shoes and insoles have been fabricated and received. ? ?

## 2021-05-27 ENCOUNTER — Encounter: Payer: Self-pay | Admitting: Podiatry

## 2021-05-27 NOTE — Progress Notes (Signed)
°  Subjective:  Patient ID: Roy [name], male    DOB: 1936/01/05,  MRN: [id number]  RONDA [name] presents to clinic today for at risk foot care with history of diabetic neuropathy and painful elongated mycotic toenails 1-5 bilaterally which are tender when wearing enclosed shoe gear. Pain is relieved with periodic professional debridement.  Patient does not monitor blood glucose daily.  New problem(s): None.   PCP is Rogers Blocker, MD , and last visit was December, 2022.  No Known Allergies  Review of Systems: Negative except as noted in the HPI. Objective:   Constitutional Roy [name] is a pleasant 86 y.o. African American male, obese in NAD. AAO x 3.   Vascular Capillary fill time to digits <3 seconds b/l lower extremities. Palpable DP pulse(s) b/l LE. Faintly palpable PT pulse(s) b/l LE. Pedal hair absent. No pain with calf compression b/l. Lower extremity skin temperature gradient within normal limits. Patient wearing compression hose on today's visit. +2 pitting edema BLE. No cyanosis or clubbing noted b/l LE.  Neurologic Normal speech. Oriented to person, place, and time. Pt has subjective symptoms of neuropathy. Protective sensation intact 5/5 intact bilaterally with 10g monofilament b/l. Vibratory sensation intact b/l.  Dermatologic Pedal integument with normal turgor, texture and tone BLE. No open wounds b/l LE. No interdigital macerations noted b/l LE. Toenails 1-5 b/l elongated, discolored, dystrophic, thickened, crumbly with subungual debris and tenderness to dorsal palpation.  Orthopedic: Muscle strength 5/5 to all lower extremity muscle groups bilaterally. Hammertoe(s) noted to the bilateral 5th toes. Pes planus deformity noted bilateral LE. Utilizes walker for ambulation assistance.   Radiographs: None  Last A1c:  Hemoglobin A1C Latest Ref Rng & Units 02/20/2021  HGBA1C 4.8 - 5.6 % 5.5  Some recent data might be hidden   Assessment:   1. Pain due to  onychomycosis of toenails of both feet   2. Diabetic peripheral neuropathy associated with type 2 diabetes mellitus ([place])    Plan:  Patient was evaluated and treated and all questions answered. Consent given for treatment as described below: -Toenails 1-5 b/l were debrided in length and girth with sterile nail nippers and dremel without iatrogenic bleeding.  -Patient/POA to call should there be question/concern in the interim.  Return in about 3 months (around 08/18/2021).  Marzetta Board, DPM

## 2021-05-28 ENCOUNTER — Telehealth: Payer: Self-pay

## 2021-05-28 NOTE — Telephone Encounter (Signed)
Casts sent to central fab - HOLD FOR CMN ?

## 2021-06-20 ENCOUNTER — Other Ambulatory Visit: Payer: Self-pay

## 2021-06-20 ENCOUNTER — Ambulatory Visit (HOSPITAL_COMMUNITY)
Admission: RE | Admit: 2021-06-20 | Discharge: 2021-06-20 | Disposition: A | Discharge status: Home / Self Care | Payer: Medicare Other | Source: Ambulatory Visit | Attending: Nephrology | Admitting: Nephrology

## 2021-06-20 VITALS — BP 132/86 | HR 40 | Temp 97.4°F | Resp 18

## 2021-06-20 DIAGNOSIS — D631 Anemia in chronic kidney disease: Secondary | ICD-10-CM | POA: Diagnosis not present

## 2021-06-20 DIAGNOSIS — N183 Chronic kidney disease, stage 3 unspecified: Secondary | ICD-10-CM | POA: Diagnosis not present

## 2021-06-20 LAB — POCT HEMOGLOBIN-HEMACUE: Hemoglobin: 9.7 g/dL — ABNORMAL LOW (ref 13.0–17.0)

## 2021-06-20 LAB — IRON AND TIBC
Iron: 66 ug/dL (ref 45–182)
Saturation Ratios: 21 % (ref 17.9–39.5)
TIBC: 321 ug/dL (ref 250–450)
UIBC: 255 ug/dL

## 2021-06-20 LAB — FERRITIN: Ferritin: 72 ng/mL (ref 24–336)

## 2021-06-20 MED ORDER — EPOETIN ALFA-EPBX 10000 UNIT/ML IJ SOLN
10000.0000 [IU] | INTRAMUSCULAR | Status: DC
  Administered 2021-06-20: 10000 [IU] via SUBCUTANEOUS

## 2021-06-20 MED ORDER — EPOETIN ALFA-EPBX 10000 UNIT/ML IJ SOLN
INTRAMUSCULAR | Status: AC
  Filled 2021-06-20: qty 1

## 2021-06-27 ENCOUNTER — Encounter (HOSPITAL_COMMUNITY)
Admission: RE | Admit: 2021-06-27 | Discharge: 2021-06-27 | Disposition: A | Discharge status: Home / Self Care | Payer: Medicare Other | Source: Ambulatory Visit | Attending: Nephrology | Admitting: Nephrology

## 2021-06-27 VITALS — BP 163/97 | HR 94 | Temp 97.4°F | Resp 18

## 2021-06-27 DIAGNOSIS — N183 Chronic kidney disease, stage 3 unspecified: Secondary | ICD-10-CM | POA: Insufficient documentation

## 2021-06-27 LAB — POCT HEMOGLOBIN-HEMACUE: Hemoglobin: 9.1 g/dL — ABNORMAL LOW (ref 13.0–17.0)

## 2021-06-27 MED ORDER — EPOETIN ALFA-EPBX 10000 UNIT/ML IJ SOLN
INTRAMUSCULAR | Status: AC
  Filled 2021-06-27: qty 1

## 2021-06-27 MED ORDER — EPOETIN ALFA-EPBX 10000 UNIT/ML IJ SOLN
10000.0000 [IU] | INTRAMUSCULAR | Status: DC
  Administered 2021-06-27: 10000 [IU] via SUBCUTANEOUS

## 2021-07-04 ENCOUNTER — Encounter (HOSPITAL_COMMUNITY)
Admission: RE | Admit: 2021-07-04 | Discharge: 2021-07-04 | Disposition: A | Discharge status: Home / Self Care | Payer: Medicare Other | Source: Ambulatory Visit | Attending: Nephrology | Admitting: Nephrology

## 2021-07-04 VITALS — BP 145/66 | HR 58 | Temp 97.3°F

## 2021-07-04 DIAGNOSIS — N183 Chronic kidney disease, stage 3 unspecified: Secondary | ICD-10-CM

## 2021-07-04 LAB — POCT HEMOGLOBIN-HEMACUE: Hemoglobin: 9.5 g/dL — ABNORMAL LOW (ref 13.0–17.0)

## 2021-07-04 MED ORDER — EPOETIN ALFA-EPBX 10000 UNIT/ML IJ SOLN
INTRAMUSCULAR | Status: AC
  Filled 2021-07-04: qty 1

## 2021-07-04 MED ORDER — EPOETIN ALFA-EPBX 10000 UNIT/ML IJ SOLN
10000.0000 [IU] | INTRAMUSCULAR | Status: DC
  Administered 2021-07-04: 10000 [IU] via SUBCUTANEOUS

## 2021-07-05 ENCOUNTER — Encounter (HOSPITAL_COMMUNITY): Payer: Self-pay

## 2021-07-06 ENCOUNTER — Telehealth: Payer: Self-pay

## 2021-07-06 NOTE — Telephone Encounter (Signed)
Spoke with patient's son Dellis Filbert and scheduled a telephonic Palliative Consult for 07/09/21 @ 2 PM.  ? ?Consent obtained; updated Netsmart, Team List and Epic.  ? ?

## 2021-07-09 ENCOUNTER — Other Ambulatory Visit: Payer: BC Managed Care – PPO | Admitting: Family Medicine

## 2021-07-09 DIAGNOSIS — I5032 Chronic diastolic (congestive) heart failure: Secondary | ICD-10-CM

## 2021-07-09 DIAGNOSIS — Z789 Other specified health status: Secondary | ICD-10-CM

## 2021-07-09 DIAGNOSIS — I4819 Other persistent atrial fibrillation: Secondary | ICD-10-CM

## 2021-07-09 NOTE — Progress Notes (Signed)
? ? ?Manufacturing engineer ?Community Palliative Care Consult Note ?Telephone: [phone]  ?Fax: [phone]  ? ?Date of encounter: 07/09/21 ?2:06 PM ?PATIENT NAME: Roy [name] ?[address] ?[place] [address]   ?[phone] (home)  ?DOB: 1936-01-29 ?MRN: [id number] ?PRIMARY CARE PROVIDER:    ?Rogers Blocker, MD,  ?[address] ?[place] [address] ?[phone] ? ?REFERRING PROVIDER:   ?Rogers Blocker, MD ?[address] ?[address] ?[place],  [place] [address] ?[phone] ? ?RESPONSIBLE PARTY:    ?Contact Information   ? ? Name Relation Home Work Mobile  ? Halbert, Jesson Son   [phone]  ? Niyam, Bisping Sister   [phone]  ? ?  ? ? ?I connected with  Herma Carson on 07/10/21 by telephone application as pt was unable to use video technology and verified that I am speaking with the correct person using two identifiers. ?  ?I discussed the limitations of evaluation and management by telemedicine. The patient expressed understanding and agreed to proceed.  ? Palliative Care was asked to follow this patient by consultation request of  Rogers Blocker, MD to address advance care planning and complex medical decision making. This is the initial visit.  ? ? ?      ASSESSMENT, SYMPTOM MANAGEMENT AND PLAN / RECOMMENDATIONS:  ?Chronic Diastolic Heart Failure ?Continue Isordil 30 mg daily ?Continue Labetalol 100 mg BID ?Continue Torsemide 50 mg daily. ?Low sodium intake. ?Discuss with pt and caregiver goals of care at next follow up ?Follow daily weight and if increased by 2-3 lbs /24 hrs or 5 lbs in 1 week, need to follow up with provider. ? ?Persistent atrial fibrillation ?Anticoagulated on Eliquis 2.5 mg BID ?Continue Labetalol 100 mg BID ? ?Needs community resources ?SW referral for needs resource to help with evening meal prep ? ? ?Follow up Palliative Care Visit: Palliative care will continue to follow for complex medical decision making, advance care planning,  and clarification of goals. Return 2-3 weeks or prn. ? ? ?PPS: 60% ? ?HOSPICE ELIGIBILITY/DIAGNOSIS: TBD ? ?Chief Complaint:  ?[facility] received a referral to follow up with patient for chronic disease management chronic diastolic heart failure with pulmonary hypertension and persistent atrial fibrillation.  Follow up for advance directive counseling and defining/refining goals of care. ? ?HISTORY OF PRESENT ILLNESS:  Roy [name] is a 86 y.o. year old male with chronic diastolic heart failure with pulmonary hypertension and persistent atrial fibrillation. 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  No CP, SOB or falls.  Appetite is good and good mood. Recent weight loss but has gained since hospitalization.  Seen in the infusion center for an injection or infusion to increase RBCs.  Not feeling really tired.  Blood sugars are well controlled. HGB A1c last 5.5% on 02/20/21.  Have control of bowel and bladder.  Pt is requesting SW follow up with son [name] regarding meals for evening.   ? ?History obtained from review of EMR, discussion with son [name] and/or Mr. Kitchings.  ?I reviewed available labs, medications, imaging, studies and related documents from the EMR.  Records reviewed and summarized above.  ? ?ROS ?General: NAD ?EYES: denies vision changes ?ENMT: denies dysphagia ?Cardiovascular: denies chest pain, denies DOE, PND, orthopnea ?Pulmonary: denies cough, denies increased SOB ?Abdomen: endorses good appetite, denies  constipation, endorses continence of bowel ?GU: denies dysuria, endorses continence of urine ?MSK:  denies increased weakness, no falls reported ?Skin: denies rashes or wounds ?Neurological: denies pain, denies insomnia ?Psych: Endorses positive mood ?Heme/lymph/immuno: denies bruises, abnormal bleeding ? ?Physical Exam:  limited to audible by phone ?Current and past weights: As of 04/18/21 weight was 206 lbs ?CV: Can speak in complete sentences without having to stop to breathe ?Pulmonary: No audible wheezing or dyspnea ?Psych: non-anxious affect, A and O x 3 ? ? ?CURRENT PROBLEM LIST:  ?Patient Active Problem List  ? Diagnosis Date Noted  ? Syncope and collapse 02/20/2021  ? Hypokalemia 02/20/2021  ? Elevated LFTs 02/20/2021  ? Leukocytosis 02/20/2021  ? Acute renal failure superimposed on stage 3 chronic kidney disease ([place]) 02/20/2021  ? Chronic diastolic CHF (congestive heart failure) ([place]) 02/20/2021  ? Pulmonary hypertension, unspecified ([place]) 12/17/2019  ? Persistent atrial fibrillation ([place]) 12/17/2019  ? Leg edema 11/18/2019  ? Exertional dyspnea 11/18/2019  ? Infected cyst of skin 01/25/2016  ? Abscess of external ear, left 01/11/2016  ? Acute deep vein thrombosis (DVT) of femoral vein of left lower extremity ([place])   ? Bacteremia due to Enterococcus 10/09/2015  ? CKD (chronic kidney disease) stage 3, GFR 30-59 ml/min (H[facility]) 10/06/2015  ? Sepsis secondary to UTI ([place]) 10/06/2015  ? UTI (lower urinary tract infection) 10/06/2015  ? Benign prostatic hyperplasia with urinary obstruction 07/24/2015  ? DM2 (diabetes mellitus, type 2) ([place]) 01/06/2007  ? EXOGENOUS OBESITY 01/06/2007  ? OSA on CPAP 01/06/2007  ? Essential hypertension 01/06/2007  ? Chronic obstructive airway disease with asthma ([place]) 01/06/2007  ? ?PAST MEDICAL HISTORY:  ?Active Ambulatory Problems  ?  Diagnosis Date Noted  ? DM2 (diabetes mellitus, type 2) ([place]) 01/06/2007  ? EXOGENOUS OBESITY 01/06/2007  ? OSA on CPAP 01/06/2007  ? Essential hypertension 01/06/2007  ? Chronic obstructive airway disease with asthma ([place]) 01/06/2007  ? CKD (chronic kidney disease) stage 3, GFR 30-59 ml/min ([facility]) 10/06/2015  ? Sepsis secondary to UTI ([place]) 10/06/2015  ? UTI (lower urinary tract infection) 10/06/2015  ? Bacteremia due to Enterococcus 10/09/2015  ? Acute deep vein  thrombosis (DVT) of femoral vein of left lower extremity ([place])   ? Abscess of external ear, left 01/11/2016  ? Infected cyst of skin 01/25/2016  ? Leg edema 11/18/2019  ? Exertional dyspnea 11/18/2019  ? Pulmonary hypertension, unspecified ([place]) 12/17/2019  ? Persistent atrial fibrillation ([place]) 12/17/2019  ? Benign prostatic hyperplasia with urinary obstruction 07/24/2015  ? Syncope and collapse 02/20/2021  ? Hypokalemia 02/20/2021  ? Elevated LFTs 02/20/2021  ? Leukocytosis 02/20/2021  ? Acute renal failure superimposed on stage 3 chronic kidney disease ([place]) 02/20/2021  ? Chronic diastolic CHF (congestive heart failure) ([place]) 02/20/2021  ? ?Resolved Ambulatory Problems  ?  Diagnosis Date Noted  ? AKI (acute kidney injury) ([place]) 10/06/2015  ? ?Past Medical History:  ?Diagnosis Date  ? A-fib ([place])   ? Arthritis HANDS/ THUMBS/ SHOULDERS  ? Bladder calculi   ? Chronic obstructive asthma, unspecified FOLLOWED BY DR Tarri Fuller YOUNG  VISIT 10-14-2011 IN EPIC  ? COPD (chronic obstructive pulmonary disease) ([place])   ? Cyst on ear   ? Diabetes mellitus   ? DVT (deep venous thrombosis) ([place])   ? Dyspnea on exertion   ? GERD (gastroesophageal reflux disease)   ? Hematuria   ? History of prostate cancer 2010-- S/P  EXTERNAL RADIATION  ? Hydronephrosis, left   ? Hypertension   ? Sepsis ([place])   ?  Wears glasses   ? ?SOCIAL HX:  ?Social History  ? ?Tobacco Use  ? Smoking status: Former  ?  Packs/day: 0.50  ?  Years: 25.00  ?  Pack years: 12.50  ?  Types: Cigarettes  ?  Quit date: 03/25/1998  ?  Years since quitting: 23.3  ? Smokeless tobacco: Never  ?Substance Use Topics  ? Alcohol use: Not Currently  ? ?FAMILY HX:  ?Family History  ?Problem Relation Age of Onset  ? Diabetes Father   ?  ? ? ?Preferred Pharmacy: ?ALLERGIES: No Known Allergies   ?PERTINENT MEDICATIONS:  ?Outpatient Encounter Medications as of 07/09/2021  ?Medication Sig  ? cinacalcet (SENSIPAR) 30 MG tablet Take 30 mg by mouth daily.  ? CONTOUR NEXT TEST test strip  daily.  ? diltiazem (CARDIZEM CD) 240 MG 24 hr capsule Take 240 mg by mouth daily.  ? ELIQUIS 2.5 MG TABS tablet Take 2.5 mg by mouth 2 (two) times daily.  ? finasteride (PROSCAR) 5 MG tablet Take 5 mg by mouth dai

## 2021-07-10 ENCOUNTER — Encounter: Payer: Self-pay | Admitting: Family Medicine

## 2021-07-10 DIAGNOSIS — Z789 Other specified health status: Secondary | ICD-10-CM | POA: Insufficient documentation

## 2021-07-11 ENCOUNTER — Encounter (HOSPITAL_COMMUNITY)
Admission: RE | Admit: 2021-07-11 | Discharge: 2021-07-11 | Disposition: A | Discharge status: Home / Self Care | Payer: Medicare Other | Source: Ambulatory Visit | Attending: Nephrology | Admitting: Nephrology

## 2021-07-11 DIAGNOSIS — N183 Chronic kidney disease, stage 3 unspecified: Secondary | ICD-10-CM

## 2021-07-11 LAB — POCT HEMOGLOBIN-HEMACUE: Hemoglobin: 10.2 g/dL — ABNORMAL LOW (ref 13.0–17.0)

## 2021-07-11 MED ORDER — EPOETIN ALFA-EPBX 10000 UNIT/ML IJ SOLN
10000.0000 [IU] | INTRAMUSCULAR | Status: DC
  Administered 2021-07-11: 10000 [IU] via SUBCUTANEOUS

## 2021-07-11 MED ORDER — EPOETIN ALFA-EPBX 10000 UNIT/ML IJ SOLN
INTRAMUSCULAR | Status: AC
  Filled 2021-07-11: qty 1

## 2021-07-13 ENCOUNTER — Telehealth: Payer: Self-pay

## 2021-07-13 NOTE — Telephone Encounter (Signed)
(  3:15 pm) SW left a message for patient's son requesting a call back. ? ? ?

## 2021-07-18 ENCOUNTER — Encounter (HOSPITAL_COMMUNITY)
Admission: RE | Admit: 2021-07-18 | Discharge: 2021-07-18 | Disposition: A | Discharge status: Home / Self Care | Payer: Medicare Other | Source: Ambulatory Visit | Attending: Nephrology | Admitting: Nephrology

## 2021-07-18 VITALS — BP 145/69 | HR 84 | Temp 97.4°F | Resp 18

## 2021-07-18 DIAGNOSIS — N183 Chronic kidney disease, stage 3 unspecified: Secondary | ICD-10-CM

## 2021-07-18 LAB — IRON AND TIBC
Iron: 50 ug/dL (ref 45–182)
Saturation Ratios: 16 % — ABNORMAL LOW (ref 17.9–39.5)
TIBC: 305 ug/dL (ref 250–450)
UIBC: 255 ug/dL

## 2021-07-18 LAB — FERRITIN: Ferritin: 55 ng/mL (ref 24–336)

## 2021-07-18 MED ORDER — EPOETIN ALFA-EPBX 10000 UNIT/ML IJ SOLN
10000.0000 [IU] | INTRAMUSCULAR | Status: DC
  Administered 2021-07-18: 10000 [IU] via SUBCUTANEOUS

## 2021-07-18 MED ORDER — EPOETIN ALFA-EPBX 10000 UNIT/ML IJ SOLN
INTRAMUSCULAR | Status: AC
  Filled 2021-07-18: qty 1

## 2021-07-19 LAB — POCT HEMOGLOBIN-HEMACUE: Hemoglobin: 8.3 g/dL — ABNORMAL LOW (ref 13.0–17.0)

## 2021-07-25 ENCOUNTER — Encounter (HOSPITAL_COMMUNITY)
Admission: RE | Admit: 2021-07-25 | Discharge: 2021-07-25 | Disposition: A | Discharge status: Home / Self Care | Payer: Medicare Other | Source: Ambulatory Visit | Attending: Nephrology | Admitting: Nephrology

## 2021-07-25 VITALS — BP 173/87 | HR 100 | Temp 97.0°F | Resp 18

## 2021-07-25 DIAGNOSIS — N183 Chronic kidney disease, stage 3 unspecified: Secondary | ICD-10-CM | POA: Diagnosis present

## 2021-07-25 LAB — POCT HEMOGLOBIN-HEMACUE: Hemoglobin: 10.5 g/dL — ABNORMAL LOW (ref 13.0–17.0)

## 2021-07-25 MED ORDER — EPOETIN ALFA-EPBX 10000 UNIT/ML IJ SOLN
10000.0000 [IU] | INTRAMUSCULAR | Status: DC
  Administered 2021-07-25: 10000 [IU] via SUBCUTANEOUS

## 2021-07-25 MED ORDER — EPOETIN ALFA-EPBX 10000 UNIT/ML IJ SOLN
INTRAMUSCULAR | Status: AC
  Filled 2021-07-25: qty 1

## 2021-07-26 ENCOUNTER — Other Ambulatory Visit: Payer: BC Managed Care – PPO | Admitting: Family Medicine

## 2021-07-26 VITALS — BP 150/76 | HR 80 | Resp 20

## 2021-07-26 DIAGNOSIS — Z515 Encounter for palliative care: Secondary | ICD-10-CM

## 2021-07-26 DIAGNOSIS — R600 Localized edema: Secondary | ICD-10-CM

## 2021-07-26 DIAGNOSIS — Z789 Other specified health status: Secondary | ICD-10-CM

## 2021-07-26 NOTE — Progress Notes (Signed)
? ? ?Manufacturing engineer ?Community Palliative Care Consult Note ?Telephone: [phone]  ?Fax: [phone]  ? ? ?Date of encounter: 07/26/21 ?3:22 PM ?PATIENT NAME: Roy [name] ?[address] ?[place] [address]   ?[phone] (home)  ?DOB: [birth year] ?MRN: [id number] ?PRIMARY CARE PROVIDER:    ?Rogers Blocker, MD,  ?[address] ?[place] [address] ?[phone] ? ?REFERRING PROVIDER:   ?Rogers Blocker, MD ?[address] ?[address] ?[place],   [address] ?[phone] ? ?RESPONSIBLE PARTY:    ?Contact Information   ? ? Name Relation Home Work Mobile  ? Tyquavious, Gamel Son   [phone]  ? Sylvain, Hasten Sister   [phone]  ? ?  ? ? ? ?I met face to face with patient and family in his home. Palliative Care was asked to follow this patient by consultation request of  Rogers Blocker, MD to address advance care planning and complex medical decision making. This is a follow up visit. ? ?                                 ASSESSMENT , SYMPTOM MANAGEMENT AND PLAN / RECOMMENDATIONS:  ? Needs assistance with community resource ?SW referral to address available resources and how to access ? ?2.   Leg Edema ?Secondary to dependent position, CHF and pulmonary HTN ?Encouraged to monitor sodium content of foods ?Continue wearing compression socks and elevating BLE when seated ?Continue Torsemide with potassium replacement ? ?3.  Palliative Care Encounter ?Expressed sympathy for current loss ?Follow up on goals of care at next visit ? ? ?Advance Care Planning/Goals of Care: Goals include to maximize quality of life and symptom management. Patient gave his permission to discuss. ?Our advance care planning conversation included a discussion about:    ?The value and importance of advance care planning  ?Experiences with loved ones who have been seriously ill or have -recent loss of sister in the last week ?Exploration of personal, cultural or spiritual beliefs that might  influence medical decisions -strong belief in God  ?Exploration of goals of care in the event of a sudden injury or illness -will defer to later visit in recognition of recent loss of sibling ?Identification of a healthcare agent-sons ?Review and updating or creation of an  advance directive document-Briefly introduced topic and will re-visit later with respect for lost sibling  ? ?CODE STATUS: ?Currently full code ? ? ? ? ?Follow up Palliative Care Visit: Palliative care will continue to follow for complex medical decision making, advance care planning, and clarification of goals. Return 4-6 weeks or prn. ? ? ? ?This visit was coded based on medical decision making (MDM). ? ?PPS: 60% ? ?HOSPICE ELIGIBILITY/DIAGNOSIS: TBD ? ?Chief Complaint: ?Palliative Care is following up for chronic medical management in setting of chronic congestive heart failure and also to address advance directive status and defining/refining goals of care. ? ?HISTORY OF PRESENT ILLNESS:  Roy [name] is a 86 y.o. year old male with chronic diastolic CHF, persistent atrial fibrillation, pulmonary hypertension, COPD, OSA on CPAP, GERD, diet controlled DM, arthritis, BPH with hx of LUTS/nephrolithiasis, CKD stage 3 and leg edema.   ?No problems with appetite and no falls.  No CP, SOB, nausea, vomiting, dysuria, LUTS or constipation.   Using CPAP and feels rested. Has follow up with nephrologist next week with Dr Madelon Lips.  HGB A1c 5.5% in November 2022.  HGB 10.2  on 07/25/21.  CMP with Cr 1.8-2.0 baseline, had elevation in November 2023 to 3.08 with eGFR 19-21 which has since resolved.  Son states that pt has identical twin sisters, one of which has passed away in the last week. Son fills up pillbox for pt and is interested in finding program that will help with dinner meal.  States previously talked with SW and was told about a community resource to obtain meals from [employer] but there might be a wait list for this and wants  contact from SW to discuss getting the pt enrolled for this service. ? ?History obtained from review of EMR, discussion with family,  and/or Mr. Lukas.  ?I reviewed available labs, medications, imaging, studies and related documents from the EMR.  Records reviewed and summarized above.  ? ?ROS ? ?General: NAD ?EYES: denies vision changes ?ENMT: denies dysphagia ?Cardiovascular: denies chest pain, denies DOE, endorses persistent BLE edema ?Pulmonary: denies cough, denies increased SOB ?Abdomen: endorses good appetite, denies constipation, endorses continence of bowel ?GU: denies dysuria, endorses continence of urine ?MSK:  denies increased weakness, no falls reported ?Skin: denies rashes or wounds ?Neurological: denies pain, denies insomnia ?Psych: Endorses positive mood ?Heme/lymph/immuno: denies bruises, abnormal bleeding ? ?Physical Exam: ?Current and past weights: 206 lbs today ?Constitutional: NAD, appears sleepy ?General: WNWD ?EYES: anicteric sclera, lids intact, no discharge  ?ENMT: mildly hard of hearing, oral mucous membranes moist, dentition intact ?CV: S1S2, IRIR, 2+ BLE edema (2-3+ on left) ?Pulmonary: CTAB, no increased work of breathing, no cough, room air ?Abdomen: normo-active BS + 4 quadrants, soft and non tender, no ascites ?GU: deferred ?MSK: no sarcopenia, moves all extremities, ambulatory ?Skin: warm and dry, no rashes or wounds on visible skin ?Neuro:  no generalized weakness,  no cognitive impairment ?Psych: non-anxious affect, A and O x 3 ?Hem/lymph/immuno: no widespread bruising ? ? ?Thank you for the opportunity to participate in the care of Mr. Schaffert.  The palliative care team will continue to follow. Please call our office at [phone] if we can be of additional assistance.  ? ?Marijo Conception, FNP -C ? ?COVID-19 PATIENT SCREENING TOOL ?Asked and negative response unless otherwise noted:  ? ?Have you had symptoms of covid, tested positive or been in contact with someone with  symptoms/positive test in the past 5-10 days? no ? ?

## 2021-07-27 ENCOUNTER — Encounter (HOSPITAL_COMMUNITY): Payer: Medicare Other

## 2021-07-28 ENCOUNTER — Encounter: Payer: Self-pay | Admitting: Family Medicine

## 2021-07-28 DIAGNOSIS — Z515 Encounter for palliative care: Secondary | ICD-10-CM | POA: Insufficient documentation

## 2021-07-30 ENCOUNTER — Other Ambulatory Visit: Payer: BC Managed Care – PPO | Admitting: Family Medicine

## 2021-07-31 ENCOUNTER — Encounter (HOSPITAL_COMMUNITY): Payer: Medicare Other

## 2021-08-01 ENCOUNTER — Encounter (HOSPITAL_COMMUNITY)
Admission: RE | Admit: 2021-08-01 | Discharge: 2021-08-01 | Disposition: A | Discharge status: Home / Self Care | Payer: Medicare Other | Source: Ambulatory Visit | Attending: Nephrology | Admitting: Nephrology

## 2021-08-01 VITALS — BP 157/86 | HR 89 | Temp 98.2°F | Resp 20

## 2021-08-01 DIAGNOSIS — N183 Chronic kidney disease, stage 3 unspecified: Secondary | ICD-10-CM

## 2021-08-01 LAB — POCT HEMOGLOBIN-HEMACUE: Hemoglobin: 10.1 g/dL — ABNORMAL LOW (ref 13.0–17.0)

## 2021-08-01 MED ORDER — EPOETIN ALFA-EPBX 10000 UNIT/ML IJ SOLN
10000.0000 [IU] | INTRAMUSCULAR | Status: DC
  Administered 2021-08-01: 10000 [IU] via SUBCUTANEOUS

## 2021-08-06 ENCOUNTER — Telehealth: Payer: Self-pay

## 2021-08-06 NOTE — Telephone Encounter (Signed)
Casts released from [employer] and shoes ordered - [employer] 510 black 11.5XW ?

## 2021-08-08 ENCOUNTER — Encounter (HOSPITAL_COMMUNITY)
Admission: RE | Admit: 2021-08-08 | Discharge: 2021-08-08 | Disposition: A | Discharge status: Home / Self Care | Payer: Medicare Other | Source: Ambulatory Visit | Attending: Nephrology | Admitting: Nephrology

## 2021-08-08 ENCOUNTER — Telehealth: Payer: Self-pay

## 2021-08-08 VITALS — BP 138/60 | HR 63 | Resp 20

## 2021-08-08 DIAGNOSIS — N183 Chronic kidney disease, stage 3 unspecified: Secondary | ICD-10-CM | POA: Diagnosis not present

## 2021-08-08 LAB — POCT HEMOGLOBIN-HEMACUE: Hemoglobin: 10.7 g/dL — ABNORMAL LOW (ref 13.0–17.0)

## 2021-08-08 MED ORDER — EPOETIN ALFA-EPBX 10000 UNIT/ML IJ SOLN: 10000.0000 [IU] | INTRAMUSCULAR | Status: DC

## 2021-08-08 MED ORDER — EPOETIN ALFA-EPBX 10000 UNIT/ML IJ SOLN
INTRAMUSCULAR | Status: AC
  Administered 2021-08-08: 10000 [IU] via SUBCUTANEOUS
  Filled 2021-08-08: qty 1

## 2021-08-08 NOTE — Telephone Encounter (Signed)
(  4:10 pm) SW Curator to refer patient for mobile meals.  ?

## 2021-08-15 ENCOUNTER — Encounter (HOSPITAL_COMMUNITY)
Admission: RE | Admit: 2021-08-15 | Discharge: 2021-08-15 | Disposition: A | Discharge status: Home / Self Care | Payer: Medicare Other | Source: Ambulatory Visit | Attending: Nephrology | Admitting: Nephrology

## 2021-08-15 VITALS — BP 157/85 | HR 104 | Temp 97.8°F | Resp 18

## 2021-08-15 DIAGNOSIS — N183 Chronic kidney disease, stage 3 unspecified: Secondary | ICD-10-CM

## 2021-08-15 LAB — IRON AND TIBC
Iron: 19 ug/dL — ABNORMAL LOW (ref 45–182)
Saturation Ratios: 6 % — ABNORMAL LOW (ref 17.9–39.5)
TIBC: 311 ug/dL (ref 250–450)
UIBC: 292 ug/dL

## 2021-08-15 LAB — POCT HEMOGLOBIN-HEMACUE: Hemoglobin: 10.7 g/dL — ABNORMAL LOW (ref 13.0–17.0)

## 2021-08-15 LAB — FERRITIN: Ferritin: 74 ng/mL (ref 24–336)

## 2021-08-15 MED ORDER — EPOETIN ALFA-EPBX 10000 UNIT/ML IJ SOLN
10000.0000 [IU] | INTRAMUSCULAR | Status: DC
  Administered 2021-08-15: 10000 [IU] via SUBCUTANEOUS

## 2021-08-15 MED ORDER — EPOETIN ALFA-EPBX 10000 UNIT/ML IJ SOLN
INTRAMUSCULAR | Status: AC
  Filled 2021-08-15: qty 1

## 2021-08-21 ENCOUNTER — Other Ambulatory Visit (HOSPITAL_COMMUNITY): Payer: Self-pay

## 2021-08-21 ENCOUNTER — Telehealth: Payer: Self-pay

## 2021-08-21 NOTE — Telephone Encounter (Signed)
Shoes and insoles ready for pickup - LVM for patient to call back and schedule appointment

## 2021-08-22 ENCOUNTER — Encounter (HOSPITAL_COMMUNITY)
Admission: RE | Admit: 2021-08-22 | Discharge: 2021-08-22 | Disposition: A | Discharge status: Home / Self Care | Payer: Medicare Other | Source: Ambulatory Visit | Attending: Nephrology | Admitting: Nephrology

## 2021-08-22 ENCOUNTER — Ambulatory Visit (INDEPENDENT_AMBULATORY_CARE_PROVIDER_SITE_OTHER): Payer: Self-pay | Admitting: Podiatry

## 2021-08-22 VITALS — BP 158/103 | HR 67 | Temp 97.2°F

## 2021-08-22 DIAGNOSIS — N183 Chronic kidney disease, stage 3 unspecified: Secondary | ICD-10-CM

## 2021-08-22 DIAGNOSIS — Z91199 Patient's noncompliance with other medical treatment and regimen due to unspecified reason: Secondary | ICD-10-CM

## 2021-08-22 MED ORDER — EPOETIN ALFA-EPBX 10000 UNIT/ML IJ SOLN
10000.0000 [IU] | INTRAMUSCULAR | Status: DC
  Administered 2021-08-22: 10000 [IU] via SUBCUTANEOUS

## 2021-08-22 MED ORDER — EPOETIN ALFA-EPBX 10000 UNIT/ML IJ SOLN
INTRAMUSCULAR | Status: AC
  Filled 2021-08-22: qty 1

## 2021-08-23 LAB — POCT HEMOGLOBIN-HEMACUE: Hemoglobin: 10.1 g/dL — ABNORMAL LOW (ref 13.0–17.0)

## 2021-08-23 NOTE — Progress Notes (Signed)
   Complete physical exam  Patient: Roy [name]   DOB: 01/12/1997   86 y.o. Male  MRN: [id number]  Subjective:    No chief complaint on file.   Roy [name] is a 86 y.o. male who presents today for a complete physical exam. She reports consuming a {diet types:17450} diet. {types:19826} She generally feels {DESC; WELL/FAIRLY WELL/POORLY:18703}. She reports sleeping {DESC; WELL/FAIRLY WELL/POORLY:18703}. She {does/does not:200015} have additional problems to discuss today.    Most recent fall risk assessment:    09/19/2021   10:42 AM  Fall Risk   Falls in the past year? 0  Number falls in past yr: 0  Injury with Fall? 0  Risk for fall due to : No Fall Risks  Follow up Falls evaluation completed     Most recent depression screenings:    09/19/2021   10:42 AM 08/10/2020   10:46 AM  PHQ 2/9 Scores  PHQ - 2 Score 0 0  PHQ- 9 Score 5     {VISON DENTAL STD PSA (Optional):27386}  {History (Optional):23778}  Patient Care Team: Jessup, Joy, NP as PCP - General (Nurse Practitioner)   Outpatient Medications Prior to Visit  Medication Sig   fluticasone (FLONASE) 50 MCG/ACT nasal spray Place 2 sprays into both nostrils in the morning and at bedtime. After 7 days, reduce to once daily.   norgestimate-ethinyl estradiol (SPRINTEC 28) 0.25-35 MG-MCG tablet Take 1 tablet by mouth daily.   Nystatin POWD Apply liberally to affected area 2 times per day   spironolactone (ALDACTONE) 100 MG tablet Take 1 tablet (100 mg total) by mouth daily.   No facility-administered medications prior to visit.    ROS        Objective:     There were no vitals taken for this visit. {Vitals History (Optional):23777}  Physical Exam   No results found for any visits on 10/25/21. {Show previous labs (optional):23779}    Assessment & Plan:    Routine Health Maintenance and Physical Exam  Immunization History  Administered Date(s) Administered   DTaP 03/28/1999, 05/24/1999,  08/02/1999, 04/17/2000, 11/01/2003   Hepatitis A 08/28/2007, 09/02/2008   Hepatitis B 01/13/1999, 02/20/1999, 08/02/1999   HiB (PRP-OMP) 03/28/1999, 05/24/1999, 08/02/1999, 04/17/2000   IPV 03/28/1999, 05/24/1999, 01/21/2000, 11/01/2003   Influenza,inj,Quad PF,6+ Mos 12/03/2013   Influenza-Unspecified 03/04/2012   MMR 01/20/2001, 11/01/2003   Meningococcal Polysaccharide 09/02/2011   Pneumococcal Conjugate-13 04/17/2000   Pneumococcal-Unspecified 08/02/1999, 10/16/1999   Tdap 09/02/2011   Varicella 01/21/2000, 08/28/2007    Health Maintenance  Topic Date Due   HIV Screening  Never done   Hepatitis C Screening  Never done   INFLUENZA VACCINE  10/23/2021   PAP-Cervical Cytology Screening  10/25/2021 (Originally 01/12/2020)   PAP SMEAR-Modifier  10/25/2021 (Originally 01/12/2020)   TETANUS/TDAP  10/25/2021 (Originally 09/01/2021)   HPV VACCINES  Discontinued   COVID-19 Vaccine  Discontinued    Discussed health benefits of physical activity, and encouraged her to engage in regular exercise appropriate for her age and condition.  Problem List Items Addressed This Visit   None Visit Diagnoses     Annual physical exam    -  Primary   Cervical cancer screening       Need for Tdap vaccination          No follow-ups on file.     Joy Jessup, NP   

## 2021-08-24 NOTE — Telephone Encounter (Signed)
Lvm for pt to call back to schedule DM shoes

## 2021-08-28 ENCOUNTER — Encounter (HOSPITAL_COMMUNITY): Payer: Medicare Other

## 2021-08-29 ENCOUNTER — Ambulatory Visit: Payer: Medicare Other

## 2021-08-29 ENCOUNTER — Encounter (HOSPITAL_COMMUNITY): Payer: Medicare Other

## 2021-08-29 ENCOUNTER — Ambulatory Visit: Payer: Medicare (Managed Care) | Admitting: Internal Medicine

## 2021-08-30 ENCOUNTER — Other Ambulatory Visit: Payer: Self-pay

## 2021-08-30 ENCOUNTER — Encounter: Payer: Self-pay | Admitting: Family Medicine

## 2021-08-30 ENCOUNTER — Inpatient Hospital Stay (HOSPITAL_COMMUNITY)
Admission: EM | Admit: 2021-08-30 | Discharge: 2021-09-03 | DRG: 291 | Disposition: A | Discharge status: Home Health | Payer: Medicare Other | Attending: Family Medicine | Admitting: Family Medicine

## 2021-08-30 ENCOUNTER — Encounter (HOSPITAL_COMMUNITY): Payer: Self-pay

## 2021-08-30 ENCOUNTER — Other Ambulatory Visit: Payer: Medicare Other | Admitting: Family Medicine

## 2021-08-30 ENCOUNTER — Emergency Department (HOSPITAL_COMMUNITY): Payer: Medicare Other

## 2021-08-30 VITALS — BP 168/102 | HR 110 | Resp 24 | Wt 202.6 lb

## 2021-08-30 DIAGNOSIS — I248 Other forms of acute ischemic heart disease: Secondary | ICD-10-CM | POA: Diagnosis present

## 2021-08-30 DIAGNOSIS — I4819 Other persistent atrial fibrillation: Secondary | ICD-10-CM | POA: Diagnosis present

## 2021-08-30 DIAGNOSIS — Z20822 Contact with and (suspected) exposure to covid-19: Secondary | ICD-10-CM | POA: Diagnosis present

## 2021-08-30 DIAGNOSIS — N400 Enlarged prostate without lower urinary tract symptoms: Secondary | ICD-10-CM | POA: Diagnosis present

## 2021-08-30 DIAGNOSIS — I10 Essential (primary) hypertension: Secondary | ICD-10-CM

## 2021-08-30 DIAGNOSIS — N184 Chronic kidney disease, stage 4 (severe): Secondary | ICD-10-CM | POA: Diagnosis present

## 2021-08-30 DIAGNOSIS — J449 Chronic obstructive pulmonary disease, unspecified: Secondary | ICD-10-CM | POA: Diagnosis present

## 2021-08-30 DIAGNOSIS — Z8546 Personal history of malignant neoplasm of prostate: Secondary | ICD-10-CM

## 2021-08-30 DIAGNOSIS — Z91148 Patient's other noncompliance with medication regimen for other reason: Secondary | ICD-10-CM

## 2021-08-30 DIAGNOSIS — Z79899 Other long term (current) drug therapy: Secondary | ICD-10-CM

## 2021-08-30 DIAGNOSIS — Z7901 Long term (current) use of anticoagulants: Secondary | ICD-10-CM

## 2021-08-30 DIAGNOSIS — Z7951 Long term (current) use of inhaled steroids: Secondary | ICD-10-CM | POA: Diagnosis not present

## 2021-08-30 DIAGNOSIS — I130 Hypertensive heart and chronic kidney disease with heart failure and stage 1 through stage 4 chronic kidney disease, or unspecified chronic kidney disease: Secondary | ICD-10-CM | POA: Diagnosis present

## 2021-08-30 DIAGNOSIS — Z923 Personal history of irradiation: Secondary | ICD-10-CM | POA: Diagnosis not present

## 2021-08-30 DIAGNOSIS — Z833 Family history of diabetes mellitus: Secondary | ICD-10-CM | POA: Diagnosis not present

## 2021-08-30 DIAGNOSIS — I5033 Acute on chronic diastolic (congestive) heart failure: Secondary | ICD-10-CM | POA: Diagnosis not present

## 2021-08-30 DIAGNOSIS — K219 Gastro-esophageal reflux disease without esophagitis: Secondary | ICD-10-CM | POA: Diagnosis present

## 2021-08-30 DIAGNOSIS — I509 Heart failure, unspecified: Principal | ICD-10-CM

## 2021-08-30 DIAGNOSIS — I5023 Acute on chronic systolic (congestive) heart failure: Secondary | ICD-10-CM | POA: Diagnosis not present

## 2021-08-30 DIAGNOSIS — E1122 Type 2 diabetes mellitus with diabetic chronic kidney disease: Secondary | ICD-10-CM | POA: Diagnosis present

## 2021-08-30 DIAGNOSIS — Z87891 Personal history of nicotine dependence: Secondary | ICD-10-CM | POA: Diagnosis not present

## 2021-08-30 DIAGNOSIS — I272 Pulmonary hypertension, unspecified: Secondary | ICD-10-CM | POA: Diagnosis present

## 2021-08-30 DIAGNOSIS — G4733 Obstructive sleep apnea (adult) (pediatric): Secondary | ICD-10-CM | POA: Diagnosis present

## 2021-08-30 DIAGNOSIS — Z86718 Personal history of other venous thrombosis and embolism: Secondary | ICD-10-CM | POA: Diagnosis not present

## 2021-08-30 DIAGNOSIS — I5043 Acute on chronic combined systolic (congestive) and diastolic (congestive) heart failure: Secondary | ICD-10-CM | POA: Diagnosis present

## 2021-08-30 DIAGNOSIS — R778 Other specified abnormalities of plasma proteins: Secondary | ICD-10-CM | POA: Diagnosis not present

## 2021-08-30 DIAGNOSIS — R531 Weakness: Secondary | ICD-10-CM

## 2021-08-30 DIAGNOSIS — E119 Type 2 diabetes mellitus without complications: Secondary | ICD-10-CM

## 2021-08-30 DIAGNOSIS — Z9989 Dependence on other enabling machines and devices: Secondary | ICD-10-CM

## 2021-08-30 DIAGNOSIS — Z515 Encounter for palliative care: Secondary | ICD-10-CM

## 2021-08-30 DIAGNOSIS — R0602 Shortness of breath: Secondary | ICD-10-CM | POA: Diagnosis present

## 2021-08-30 LAB — CBC WITH DIFFERENTIAL/PLATELET
Abs Immature Granulocytes: 0.02 10*3/uL (ref 0.00–0.07)
Basophils Absolute: 0 10*3/uL (ref 0.0–0.1)
Basophils Relative: 0 %
Eosinophils Absolute: 0 10*3/uL (ref 0.0–0.5)
Eosinophils Relative: 0 %
HCT: 35.8 % — ABNORMAL LOW (ref 39.0–52.0)
Hemoglobin: 11.4 g/dL — ABNORMAL LOW (ref 13.0–17.0)
Immature Granulocytes: 0 %
Lymphocytes Relative: 8 %
Lymphs Abs: 0.6 10*3/uL — ABNORMAL LOW (ref 0.7–4.0)
MCH: 31.7 pg (ref 26.0–34.0)
MCHC: 31.8 g/dL (ref 30.0–36.0)
MCV: 99.4 fL (ref 80.0–100.0)
Monocytes Absolute: 0.3 10*3/uL (ref 0.1–1.0)
Monocytes Relative: 4 %
Neutro Abs: 6.6 10*3/uL (ref 1.7–7.7)
Neutrophils Relative %: 88 %
Platelets: 216 10*3/uL (ref 150–400)
RBC: 3.6 MIL/uL — ABNORMAL LOW (ref 4.22–5.81)
RDW: 14.5 % (ref 11.5–15.5)
WBC: 7.5 10*3/uL (ref 4.0–10.5)
nRBC: 0 % (ref 0.0–0.2)

## 2021-08-30 LAB — BASIC METABOLIC PANEL
Anion gap: 11 (ref 5–15)
BUN: 70 mg/dL — ABNORMAL HIGH (ref 8–23)
CO2: 20 mmol/L — ABNORMAL LOW (ref 22–32)
Calcium: 11.2 mg/dL — ABNORMAL HIGH (ref 8.9–10.3)
Chloride: 110 mmol/L (ref 98–111)
Creatinine, Ser: 3.15 mg/dL — ABNORMAL HIGH (ref 0.61–1.24)
GFR, Estimated: 19 mL/min — ABNORMAL LOW (ref >60)
Glucose, Bld: 80 mg/dL (ref 70–99)
Potassium: 4.1 mmol/L (ref 3.5–5.1)
Sodium: 141 mmol/L (ref 135–145)

## 2021-08-30 LAB — BRAIN NATRIURETIC PEPTIDE: B Natriuretic Peptide: 1072.4 pg/mL — ABNORMAL HIGH (ref 0.0–100.0)

## 2021-08-30 LAB — TROPONIN I (HIGH SENSITIVITY)
Troponin I (High Sensitivity): 103 ng/L (ref <18)
Troponin I (High Sensitivity): 103 ng/L (ref <18)

## 2021-08-30 MED ORDER — HYDRALAZINE HCL 20 MG/ML IJ SOLN
5.0000 mg | INTRAMUSCULAR | Status: DC | PRN
  Administered 2021-08-31 (×2): 5 mg via INTRAVENOUS
  Filled 2021-08-30 (×2): qty 1

## 2021-08-30 MED ORDER — ACETAMINOPHEN 325 MG PO TABS: 650.0000 mg | ORAL_TABLET | Freq: Four times a day (QID) | ORAL | Status: DC | PRN

## 2021-08-30 MED ORDER — FUROSEMIDE 10 MG/ML IJ SOLN
60.0000 mg | Freq: Once | INTRAMUSCULAR | Status: AC
  Administered 2021-08-30: 60 mg via INTRAVENOUS
  Filled 2021-08-30: qty 6

## 2021-08-30 MED ORDER — ACETAMINOPHEN 650 MG RE SUPP: 650.0000 mg | Freq: Four times a day (QID) | RECTAL | Status: DC | PRN

## 2021-08-30 NOTE — ED Provider Notes (Signed)
[facility] EMERGENCY DEPARTMENT Provider Note   CSN: [id number] Arrival date & time: 08/30/21  1831     History  Chief Complaint  Patient presents with   Leg Swelling   Irregular Heart Beat    Roy [name] is a 86 y.o. male.  Patient here with leg swelling, shortness of breath.  History of heart failure, A-fib on blood thinner.  Has been not taking medications over the last several weeks at times.  Compliance issues.  Patient lives by himself.  Family member states they try to lay out his medications daily but sometimes I think he forgets.  He was seen by home health nurse today who is worried about his significant leg swelling, his heart rate.  He has been having difficulty getting around the house due to weakness and shortness of breath.  Denies any chest pain, infectious symptoms.  Nothing makes it worse or better.  No abdominal pain, nausea, vomiting.  The history is provided by the patient and a relative.       Home Medications Prior to Admission medications   Medication Sig Start Date End Date Taking? Authorizing Provider  cinacalcet (SENSIPAR) 30 MG tablet Take 30 mg by mouth daily. 06/06/19   [provider]  CONTOUR NEXT TEST test strip daily. 12/10/19   [provider]  diltiazem (CARDIZEM CD) 240 MG 24 hr capsule Take 240 mg by mouth daily.    [provider]  ELIQUIS 2.5 MG TABS tablet Take 2.5 mg by mouth 2 (two) times daily. 04/14/20   [provider]  finasteride (PROSCAR) 5 MG tablet Take 5 mg by mouth daily.    [provider]  Fluticasone-Umeclidin-Vilant (TRELEGY ELLIPTA) 100-62.5-25 MCG/INH AEPB Inhale 1 puff into the lungs daily. 02/29/20   Baird Lyons D, MD  gabapentin (NEURONTIN) 100 MG capsule Take 100 mg by mouth daily.    [provider]  isosorbide dinitrate (ISORDIL) 30 MG tablet TAKE 1 TABLET BY MOUTH THREE TIMES DAILY 05/07/21   Patwardhan, Reynold Bowen, MD  labetalol (NORMODYNE)  100 MG tablet Take 1 tablet (100 mg total) by mouth 2 (two) times daily. 06/21/20   Patwardhan, Reynold Bowen, MD  methenamine (HIPREX) 1 g tablet Take 1 g by mouth 2 (two) times daily with a meal.    [provider]  Microlet Lancets [place]  12/10/19   [provider]  pantoprazole (PROTONIX) 40 MG tablet Take 40 mg by mouth daily.    [provider]  potassium chloride SA (KLOR-CON M) 20 MEQ tablet Take 20 mEq by mouth daily.    [provider]  tamsulosin (FLOMAX) 0.4 MG CAPS capsule Take 1 capsule (0.4 mg total) by mouth daily. 10/18/15   Bonnell Public, MD  torsemide (DEMADEX) 100 MG tablet Take 0.5 tablets (50 mg total) by mouth daily. 02/24/21   Regalado, Belkys A, MD  zaleplon (SONATA) 5 MG capsule Take 1 capsule (5 mg total) by mouth at bedtime as needed for sleep. 02/26/21   Regalado, Cassie Freer, MD      Allergies    Patient has no known allergies.    Review of Systems   Review of Systems  Physical Exam Updated Vital Signs BP (!) 185/118   Pulse (!) 101   Temp (!) 97.4 F (36.3 C) (Oral)   Resp 17   SpO2 100%  Physical Exam Vitals and nursing note reviewed.  Constitutional:      General: He is not in acute  distress.    Appearance: He is well-developed. He is not ill-appearing.  HENT:     Head: Normocephalic and atraumatic.     Nose: Nose normal.     Mouth/Throat:     Mouth: Mucous membranes are moist.  Eyes:     Extraocular Movements: Extraocular movements intact.     Conjunctiva/sclera: Conjunctivae normal.     Pupils: Pupils are equal, round, and reactive to light.  Cardiovascular:     Rate and Rhythm: Normal rate and regular rhythm.     Pulses: Normal pulses.     Heart sounds: Normal heart sounds. No murmur heard. Pulmonary:     Effort: Pulmonary effort is normal. No respiratory distress.     Breath sounds: Normal breath sounds.  Abdominal:     Palpations: Abdomen is soft.     Tenderness: There is no abdominal tenderness.   Musculoskeletal:        General: No swelling.     Cervical back: Neck supple.     Right lower leg: Edema present.     Left lower leg: Edema present.     Comments: 3+ pitting edema bilaterally  Skin:    General: Skin is warm and dry.     Capillary Refill: Capillary refill takes less than 2 seconds.  Neurological:     General: No focal deficit present.     Mental Status: He is alert.  Psychiatric:        Mood and Affect: Mood normal.     ED Results / Procedures / Treatments   Labs (all labs ordered are listed, but only abnormal results are displayed) Labs Reviewed  BASIC METABOLIC PANEL - Abnormal; Notable for the following components:      Result Value   CO2 20 (*)    BUN 70 (*)    Creatinine, Ser 3.15 (*)    Calcium 11.2 (*)    GFR, Estimated 19 (*)    All other components within normal limits  CBC WITH DIFFERENTIAL/PLATELET - Abnormal; Notable for the following components:   RBC 3.60 (*)    Hemoglobin 11.4 (*)    HCT 35.8 (*)    Lymphs Abs 0.6 (*)    All other components within normal limits  BRAIN NATRIURETIC PEPTIDE - Abnormal; Notable for the following components:   B Natriuretic Peptide 1,072.4 (*)    All other components within normal limits  TROPONIN I (HIGH SENSITIVITY) - Abnormal; Notable for the following components:   Troponin I (High Sensitivity) 103 (*)    All other components within normal limits  TROPONIN I (HIGH SENSITIVITY) - Abnormal; Notable for the following components:   Troponin I (High Sensitivity) 103 (*)    All other components within normal limits    EKG EKG Interpretation  Date/Time:  Thursday August 30 2021 18:49:52 EDT Ventricular Rate:  102 PR Interval:  156 QRS Duration: 90 QT Interval:  388 QTC Calculation: 505 R Axis:   -35 Text Interpretation: Sinus tachycardia with Premature atrial complexes in a pattern of bigeminy Left axis deviation Minimal voltage criteria for LVH, may be normal variant ( R in aVL ) Nonspecific ST and T  wave abnormality Abnormal ECG When compared with ECG of 20-Feb-2021 12:59, PREVIOUS ECG IS PRESENT Confirmed by Lennice Sites ([id number]) on 08/30/2021 9:06:53 PM  Radiology DG Chest 2 View  Result Date: 08/30/2021 CLINICAL DATA:  Leg swelling.  Shortness of breath EXAM: CHEST - 2 VIEW COMPARISON:  10/07/2015 FINDINGS: reverse apical lordotic frontal positioning. Midline trachea.  Normal heart size. No pleural effusion or pneumothorax. Pulmonary interstitial prominence is favored to be due to AP portable technique and low lung volumes. No overt congestive failure. There may be mild subsegmental atelectasis at the lung bases. IMPRESSION: Low lung volumes, without convincing evidence of acute cardiopulmonary disease. Limited by reverse apical lordotic positioning AP portable technique. Electronically Signed   By: Abigail Miyamoto M.D.   On: 08/30/2021 19:37    Procedures Procedures    Medications Ordered in ED Medications  furosemide (LASIX) injection 60 mg (60 mg Intravenous Given 08/30/21 2134)    ED Course/ Medical Decision Making/ A&P                           Medical Decision Making Risk Prescription drug management. Decision regarding hospitalization.   KWESI SANGHA is here with leg swelling.  Shortness of breath.  Overall hypertensive but otherwise normal vitals.  No fever.  Looks grossly volume overloaded on exam.  Significant pitting edema bilaterally.  Rales.  EKG shows sinus rhythm.  No ischemic changes.  He is not having chest pain.  Differential diagnosis is likely heart failure exacerbation, ACS.  He is on blood thinner for A-fib.  Does not seem to have RVR.  No concern for blood clot.  Will check CBC, CMP, troponin, BNP, chest x-ray.  Does not have any infectious symptoms.  Unlikely pneumonia.  Per my review and interpretation of chest x-ray, no obvious pneumonia.  No obvious volume overload.  However troponin is 100x2.  BNP is 1200.  Creatinine at baseline at 3.15.  Otherwise lab work  is unremarkable.  Overall suspect acute on chronic heart failure in the setting of noncompliance.  We will give a dose of IV Lasix.  Suspect that patient may need skilled nursing facility long-term or significant more help at home with home health team.  Will admit to medicine for further care.  This chart was dictated using voice recognition software.  Despite best efforts to proofread,  errors can occur which can change the documentation meaning.         Final Clinical Impression(s) / ED Diagnoses Final diagnoses:  Acute on chronic heart failure, unspecified heart failure type [facility])    Rx / DC Orders ED Discharge Orders     None         Lennice Sites, DO 08/30/21 2210

## 2021-08-30 NOTE — ED Provider Triage Note (Signed)
Emergency Medicine Provider Triage Evaluation Note  Roy [name] , a 86 y.o. male  was evaluated in triage.  Pt complains of lower extremity swelling.  Patient has not been taking his medicine for at least 2 weeks, the leg swelling has been going on for that time.  Also adding an irregular heartbeat but denies any chest pain.  On Eliquis, supposed be taking torsemide but has not for some time.  Patient was independently, son checks on him and gives him medicine..  Review of Systems  PER HPI  Physical Exam  BP (!) 174/95 (BP Location: Right Arm)   Pulse (!) 101   Temp (!) 97.4 F (36.3 C) (Oral)   Resp 20   SpO2 100%  Gen:   Awake, no distress   Resp:  Normal effort  MSK:   Moves extremities without difficulty  Other:  Pitting edema bilateral lower extremities up to the knees.  Medical Decision Making  Medically screening exam initiated at 6:58 PM.  Appropriate orders placed.  Roy [name] was informed that the remainder of the evaluation will be completed by another provider, this initial triage assessment does not replace that evaluation, and the importance of remaining in the ED until their evaluation is complete.     Sherrill Raring, PA-C 08/30/21 1858

## 2021-08-30 NOTE — ED Triage Notes (Signed)
Patient has not been taking meds for awhile, home health nurse assessing leg swelling and determine hr was irregular.

## 2021-08-30 NOTE — H&P (Signed)
History and Physical    [name] WPY:[id number] DOB: Mar 05, 1936 DOA: 08/30/2021  PCP: Rogers Blocker, MD  Patient coming from: Home  Chief Complaint: Swelling of legs , HPI: Roy [name] is a 86 y.o. male with medical history significant of persistent A-fib on Eliquis, HFpEF, DVT, asthma/COPD, diet-controlled diabetes, pulmonary hypertension, GERD, hypertension, BPH with history of LUTS/nephrolithiasis, CKD stage IV, OSA on CPAP.  Seen by PCP today fatigue, dyspnea, and lower extremity edema.  He was not taking his heart failure medications consistently for the past 2 weeks.  Sent to the ED for further evaluation.  In the ED, mildly tachycardic and hypertensive with systolic in the 053Z to 767H and diastolic above 419.  Labs showing WBC 7.5, hemoglobin 11.4 (stable), platelet count 216k.  Sodium 141, potassium 4.1, chloride 110, bicarb 20, BUN 70, creatinine 3.1 (stable), glucose 80, calcium 11.2.  BNP 1072.  High-sensitivity troponin 103.  EKG without acute ischemic changes.  Chest x-ray without evidence of pulmonary edema but study limited by reverse apical lordotic positioning AP portable technique. Patient was given IV Lasix 60 mg.  Patient reports progressively worsening bilateral lower extremity edema for several weeks.  He denies dyspnea or orthopnea.  Reports drinking 5-6 bottles of water and Gatorade per day.  History provided by son at bedside who is concerned that patient is not taking his home medications consistently for several weeks.  Patient lives alone and has a home health nurse who visits him once a month.  Today when the home health nurse visited the patient she noticed that his blood pressure was high, heart rate irregular, and both of his legs were very swollen.  Son states he works full-time and checks on the patient periodically to help fill out his pillbox and brings him meals.  He has an elder brother and a younger brother who both live out of town.  He is concerned  that the patient's sleep schedule is erratic which could be contributing to his medication noncompliance.  He usually stays awake at night and sleeps during daytime.  Review of Systems:  Review of Systems  All other systems reviewed and are negative.   Past Medical History:  Diagnosis Date   A-fib [facility])    Abscess of external ear, left 01/11/2016   Acute deep vein thrombosis (DVT) of femoral vein of left lower extremity (HCC)    VQ scan negative/low probability   Arthritis HANDS/ THUMBS/ SHOULDERS   Bacteremia due to Enterococcus 10/09/2015   Bladder calculi    Chronic obstructive asthma, unspecified FOLLOWED BY DR Tarri Fuller YOUNG  VISIT 10-14-2011 IN EPIC   COPD (chronic obstructive pulmonary disease) (HCC)    Cyst on ear    left   Diabetes mellitus    DVT (deep venous thrombosis) (HCC)    Dyspnea on exertion    GERD (gastroesophageal reflux disease)    Hematuria    History of prostate cancer 2010-- S/P  EXTERNAL RADIATION   Hydronephrosis, left    Hypertension    Infected cyst of skin 01/25/2016   OSA on CPAP    wears CPAP   Sepsis ([place])    Sepsis secondary to UTI ([place]) 10/06/2015   UTI (lower urinary tract infection) 10/06/2015   Wears glasses     Past Surgical History:  Procedure Laterality Date   CARPAL TUNNEL RELEASE  05-28-2005   LEFT   CYSTOSCOPY N/A 09/26/2012   Procedure: CYSTOSCOPY, clot evacuation,fulgeration of bladder, bladder biopsy, transurethreal vaporation of prostat;  Surgeon: Alexis Frock, MD;  Location: [facility];  Service: Urology;  Laterality: N/A;   CYSTOSCOPY W/ RETROGRADES Bilateral 10/14/2015   Procedure: CYSTOSCOPY WITH BILATERAL RETROGRADE PYELOGRAM/ FULGERATION WITH LITHOLAPAXY;  Surgeon: Festus Aloe, MD;  Location: [facility];  Service: Urology;  Laterality: Bilateral;   CYSTOSCOPY WITH LITHOLAPAXY  10/18/2011   Procedure: CYSTOSCOPY WITH LITHOLAPAXY;  Surgeon: Hanley Ben, MD;  Location: [place];  Service: Urology;   Laterality: N/A;  1 HOUR NO STENT H# [id number] CELL# [phone]  MEDICARE BCBS   EAR CYST EXCISION Left 02/23/2016   Procedure: EXCISION LEFT EAR CYST;  Surgeon: Izora Gala, MD;  Location: [place];  Service: ENT;  Laterality: Left;   GANGLION CYST EXCISION  1988  (APPROX)   [place]   TEE WITHOUT CARDIOVERSION N/A 10/11/2015   Procedure: TRANSESOPHAGEAL ECHOCARDIOGRAM (TEE);  Surgeon: Sanda Klein, MD;  Location: [facility];  Service: Cardiovascular;  Laterality: N/A;     reports that he quit smoking about 23 years ago. His smoking use included cigarettes. He has a 12.50 pack-year smoking history. He has never used smokeless tobacco. He reports that he does not currently use alcohol. He reports that he does not use drugs.  No Known Allergies  Family History  Problem Relation Age of Onset   Diabetes Father     Prior to Admission medications   Medication Sig Start Date End Date Taking? Authorizing Provider  cinacalcet (SENSIPAR) 30 MG tablet Take 30 mg by mouth daily. 06/06/19   [provider]  CONTOUR NEXT TEST test strip daily. 12/10/19   [provider]  diltiazem (CARDIZEM CD) 240 MG 24 hr capsule Take 240 mg by mouth daily.    [provider]  ELIQUIS 2.5 MG TABS tablet Take 2.5 mg by mouth 2 (two) times daily. 04/14/20   [provider]  finasteride (PROSCAR) 5 MG tablet Take 5 mg by mouth daily.    [provider]  Fluticasone-Umeclidin-Vilant (TRELEGY ELLIPTA) 100-62.5-25 MCG/INH AEPB Inhale 1 puff into the lungs daily. 02/29/20   Baird Lyons D, MD  gabapentin (NEURONTIN) 100 MG capsule Take 100 mg by mouth daily.    [provider]  isosorbide dinitrate (ISORDIL) 30 MG tablet TAKE 1 TABLET BY MOUTH THREE TIMES DAILY 05/07/21   Patwardhan, Reynold Bowen, MD  labetalol (NORMODYNE) 100 MG tablet Take 1 tablet (100 mg total) by mouth 2 (two) times daily. 06/21/20   Patwardhan, Reynold Bowen, MD  methenamine  (HIPREX) 1 g tablet Take 1 g by mouth 2 (two) times daily with a meal.    [provider]  Microlet Lancets [place]  12/10/19   [provider]  pantoprazole (PROTONIX) 40 MG tablet Take 40 mg by mouth daily.    [provider]  potassium chloride SA (KLOR-CON M) 20 MEQ tablet Take 20 mEq by mouth daily.    [provider]  tamsulosin (FLOMAX) 0.4 MG CAPS capsule Take 1 capsule (0.4 mg total) by mouth daily. 10/18/15   Bonnell Public, MD  torsemide (DEMADEX) 100 MG tablet Take 0.5 tablets (50 mg total) by mouth daily. 02/24/21   Regalado, Belkys A, MD  zaleplon (SONATA) 5 MG capsule Take 1 capsule (5 mg total) by mouth at bedtime as needed for sleep. 02/26/21   Elmarie Shiley, MD    Physical Exam: Vitals:   08/30/21 1846 08/30/21 2100 08/30/21 2115  BP: (!) 174/95 (!) 179/122 (!) 185/118  Pulse: [name])  101 (!) 50 (!) 101  Resp: '20 13 17  '$ Temp: (!) 97.4 F (36.3 C)    TempSrc: Oral    SpO2: 100% 99% 100%    Physical Exam Vitals reviewed.  Constitutional:      General: He is not in acute distress. HENT:     Head: Normocephalic and atraumatic.  Eyes:     Extraocular Movements: Extraocular movements intact.  Cardiovascular:     Rate and Rhythm: Normal rate and regular rhythm.     Pulses: Normal pulses.  Pulmonary:     Effort: Pulmonary effort is normal. No respiratory distress.     Breath sounds: Normal breath sounds. No wheezing or rales.  Abdominal:     General: Bowel sounds are normal.     Palpations: Abdomen is soft.     Tenderness: There is no abdominal tenderness. There is no guarding.  Musculoskeletal:     Cervical back: Normal range of motion.     Right lower leg: Edema present.     Left lower leg: Edema present.     Comments: 4+ pitting edema of bilateral lower extremities  Skin:    General: Skin is warm and dry.  Neurological:     General: No focal deficit present.     Mental Status: He is alert and oriented to person, place,  and time.      Labs on Admission: I have personally reviewed following labs and imaging studies  CBC: Recent Labs  Lab 08/30/21 1907  WBC 7.5  NEUTROABS 6.6  HGB 11.4*  HCT 35.8*  MCV 99.4  PLT 425   Basic Metabolic Panel: Recent Labs  Lab 08/30/21 1907  NA 141  K 4.1  CL 110  CO2 20*  GLUCOSE 80  BUN 70*  CREATININE 3.15*  CALCIUM 11.2*   GFR: Estimated Creatinine Clearance: 20.3 mL/min (A) (by C-G formula based on SCr of 3.15 mg/dL (H)). Liver Function Tests: No results for input(s): "AST", "ALT", "ALKPHOS", "BILITOT", "PROT", "ALBUMIN" in the last 168 hours. No results for input(s): "LIPASE", "AMYLASE" in the last 168 hours. No results for input(s): "AMMONIA" in the last 168 hours. Coagulation Profile: No results for input(s): "INR", "PROTIME" in the last 168 hours. Cardiac Enzymes: No results for input(s): "CKTOTAL", "CKMB", "CKMBINDEX", "TROPONINI" in the last 168 hours. BNP (last 3 results) No results for input(s): "PROBNP" in the last 8760 hours. HbA1C: No results for input(s): "HGBA1C" in the last 72 hours. CBG: No results for input(s): "GLUCAP" in the last 168 hours. Lipid Profile: No results for input(s): "CHOL", "HDL", "LDLCALC", "TRIG", "CHOLHDL", "LDLDIRECT" in the last 72 hours. Thyroid Function Tests: No results for input(s): "TSH", "T4TOTAL", "FREET4", "T3FREE", "THYROIDAB" in the last 72 hours. Anemia Panel: No results for input(s): "VITAMINB12", "FOLATE", "FERRITIN", "TIBC", "IRON", "RETICCTPCT" in the last 72 hours. Urine analysis:    Component Value Date/Time   COLORURINE YELLOW 02/21/2021 2328   APPEARANCEUR CLOUDY (A) 02/21/2021 2328   LABSPEC 1.020 02/21/2021 2328   PHURINE 6.0 02/21/2021 2328   GLUCOSEU NEGATIVE 02/21/2021 2328   HGBUR MODERATE (A) 02/21/2021 2328   BILIRUBINUR NEGATIVE 02/21/2021 [place] 02/21/2021 2328   PROTEINUR >300 (A) 02/21/2021 2328   UROBILINOGEN 0.2 09/26/2012 1222   NITRITE NEGATIVE  02/21/2021 2328   LEUKOCYTESUR TRACE (A) 02/21/2021 2328    Radiological Exams on Admission: I have personally reviewed images DG Chest 2 View  Result Date: 08/30/2021 CLINICAL DATA:  Leg swelling.  Shortness of breath EXAM: CHEST -  2 VIEW COMPARISON:  10/07/2015 FINDINGS: reverse apical lordotic frontal positioning. Midline trachea. Normal heart size. No pleural effusion or pneumothorax. Pulmonary interstitial prominence is favored to be due to AP portable technique and low lung volumes. No overt congestive failure. There may be mild subsegmental atelectasis at the lung bases. IMPRESSION: Low lung volumes, without convincing evidence of acute cardiopulmonary disease. Limited by reverse apical lordotic positioning AP portable technique. Electronically Signed   By: Abigail Miyamoto M.D.   On: 08/30/2021 19:37    Assessment and Plan  Acute on chronic HFpEF Due to noncompliance.  Has significant peripheral edema.  BNP 1072. Chest x-ray without evidence of pulmonary edema but study limited by reverse apical lordotic positioning AP portable technique.  Not hypoxic. Echo done in November 2022 showing LVEF 55 to 60%. -Cardiac monitoring -Patient was given IV Lasix 60 mg in the ED.  Repeat labs in the morning to check renal function given CKD stage IV, continue diuresis if renal function is stable. -Monitor intake and output -Daily weights -Low-sodium diet with fluid restriction -Repeat echocardiogram -Patient lives alone and has a home health nurse who visits him just once a month.  His son works full-time and checks on him periodically.  Other 2 sons live out of town.  Son is interested in speaking to [facility] regarding nursing home options.  PT/OT eval, TOC c consulted for placement.  Uncontrolled hypertension Due to noncompliance.  Hypertensive with systolic currently in the 321Y and diastolic above 248. -Pharmacy med rec pending. -IV hydralazine PRN SBP >160  Elevated troponin Likely due to demand  ischemia in the setting of conditions listed above.  ACS less likely as high-sensitivity troponin elevated but stable (103 > 103), EKG without acute ischemic changes, and patient is not endorsing chest pain.  Persistent A-fib Currently rate controlled.  On Eliquis and Cardizem. -Continue home meds after pharmacy med rec is done.  Asthma/COPD Stable. -Continue home meds after pharmacy med rec is done.  Diet controlled diabetes A1c 5.5 on 02/20/2021. -Carb modified diet  BPH -Continue home meds after pharmacy med rec is done.  CKD stage IV Creatinine 3.1, stable. -Continue to monitor renal function  OSA -Continue nightly CPAP  Hypercalcemia Calcium 11.2 on BMP. -Check ionized calcium level to confirm  QT prolongation on EKG Potassium is normal. -Check magnesium level -Avoid QT prolonging drugs if possible  DVT prophylaxis:  Continue Eliquis after pharmacy med rec is done. Code Status: Full Code (discussed with the patient and his son) Family Communication: Son at bedside. Level of care: Telemetry bed Admission status: It is my clinical opinion that admission to INPATIENT is reasonable and necessary because of the expectation that this patient will require hospital care that crosses at least 2 midnights to treat this condition based on the medical complexity of the problems presented.  Given the aforementioned information, the predictability of an adverse outcome is felt to be significant.   Shela Leff MD [facility]  If 7PM-7AM, please contact night-coverage [email or web address]  08/30/2021, 9:45 PM

## 2021-08-30 NOTE — Progress Notes (Signed)
[facility] Pall[facility] Care Consult Note Telephone: [phone]  Fax: [phone]    Date of encounter: 84/08/23 4:44 PM PATIENT NAME: Roy [name] [place] [address]   [phone] (home)  DOB: Aug 27, 1935 MRN: [id number] PRIMARY CARE PROVIDER:    Rogers Blocker, MD,  [address] [place] [address] [phone]  REFERRING PROVIDER:   [name], [place],  [place] [address] [phone]  RESPONSIBLE PARTY:    Contact Information     Name Relation Home Work Roy [name]   [phone]   Roy [name], Roy [name]   [phone]        I met face to face with patient and [name] in his home. Palliative Care was asked to follow this patient by consultation request of  Roy Blocker, MD to address advance care planning and complex medical decision making. This is a follow up visit.  ASSESSMENT, SYMPTOM MANAGEMENT AND PLAN / RECOMMENDATIONS:   Acute on Chronic Diastolic Heart Failure/Pulmonary Hypertension EF 55-60% with estimated RVSP 51.6 mm HG  on 02/21/2021 Has not been consistent with meds for heart failure last 2 weeks. Significant edema, fatigue and dyspnea. Sent to ED for further evaluation, monitoring and treatment. Follow up after d/c home. Recommended daily weights with notification of weight gain of 2 lbs/24 hrs or 5 lbs/5 days.  2. Persistent atrial fibrillation Rapid rate today but rhythm regularly irregular. Recommend EKG, sent to ED.  3.  Hypertension Uncontrolled but has not had meds today, has been sleeping.  4.  Weakness Decreased activity tolerance, increased sleep, decreased po intake likely secondary to acute on chronic diastolic heart failure.  5.  Palliative Care Encounter Remains full code at present, ongoing discussion with family regarding goals of care. Pt had recent death of a sibling.     Follow up  Palliative Care Visit: Palliative care will continue to follow for complex medical decision making, advance care planning, and clarification of goals. Return 2 weeks or prn.   This visit was coded based on medical decision making (MDM).  PPS: 50%  HOSPICE ELIGIBILITY/DIAGNOSIS: TBD  Chief Complaint:  Palliative Care is following for chronic medical management of chronic diastolic heart failure and pulmonary hypertension.  HISTORY OF PRESENT ILLNESS:  Roy [name] is a 86 y.o. year old male with chronic diastolic CHF, persistent atrial fibrillation, pulmonary hypertension, COPD, OSA on CPAP, GERD, diet controlled DM, arthritis, BPH with hx of LUTS/nephrolithiasis, CKD stage 3 and leg edema.  [name] states he has his days and nights reversed so he is sleeping days and up at night meaning that he misses his medications a lot over the last 2 weeks, has had some increased edema. He has not been eating as well and getting up at night.  Pt denies orthopnea and PND but states he has been waking up at night.  Denies SOB but had to stop after ambulating 10 feet to rest.  Once settled in his recliner he kept falling asleep if not stimulated.  He has been taking some intermittent deep breaths even while falling asleep. He was noted to be significantly DOE on walking from bedroom to living room, having to stop along the way. [name] states he has been struggling the last 2-3 days to get his socks and shoes on. Has not weighed recently. On 08/22/21 HGB stable at 10.1. He does receive medication to treat anemia of CKD.  History obtained from review of EMR, discussion with [name] and/or Roy [name].  I reviewed available labs, medications, imaging, studies and related documents from the EMR.  Records reviewed and summarized above.   ROS General: NAD EYES: denies vision changes ENMT: denies dysphagia Cardiovascular: denies chest pain, denies DOE Pulmonary: denies cough, denies increased SOB Abdomen: endorses fair  appetite, denies constipation, endorses continence of bowel GU: denies dysuria, endorses continence of urine MSK:  endorses increased weakness but denies fatigue, no falls reported Skin: denies rashes or wounds Neurological: denies pain, denies insomnia Psych: Endorses positive mood Heme/lymph/immuno: denies bruises, abnormal bleeding  Physical Exam: Current and past weights: 202.6 lbs Constitutional: NAD General: obese  EYES: anicteric sclera, lids intact, no discharge  ENMT: hard of hearing, oral mucous membranes moist, edentulous CV: S1S2, regularly irregular and tachycardic, 3-4+ BLE edema to mid thigh  Pulmonary: CTAB, tachypneic, DOE, decreased activity tolerance. no cough, room air Abdomen: normo-active BS + 4 quadrants, soft and non tender, no ascites GU: deferred MSK: no sarcopenia, moves all extremities, minimally ambulatory Skin: warm and dry, no rashes or wounds on visible skin Neuro:  noted generalized weakness,  no cognitive impairment Psych: non-anxious affect, lethargic, arousable, oriented Hem/lymph/immuno: no widespread bruising   Thank you for the opportunity to participate in the care of Roy [name].  The palliative care team will continue to follow. Please call our office at [phone] if we can be of additional assistance.   Marijo Conception, FNP -C  COVID-19 PATIENT SCREENING TOOL Asked and negative response unless otherwise noted:   Have you had symptoms of covid, tested positive or been in contact with someone with symptoms/positive test in the past 5-10 days?  No

## 2021-08-31 ENCOUNTER — Inpatient Hospital Stay (HOSPITAL_COMMUNITY): Payer: Medicare Other

## 2021-08-31 ENCOUNTER — Telehealth: Payer: Self-pay

## 2021-08-31 DIAGNOSIS — I5033 Acute on chronic diastolic (congestive) heart failure: Secondary | ICD-10-CM | POA: Diagnosis not present

## 2021-08-31 DIAGNOSIS — R778 Other specified abnormalities of plasma proteins: Secondary | ICD-10-CM

## 2021-08-31 DIAGNOSIS — E1122 Type 2 diabetes mellitus with diabetic chronic kidney disease: Secondary | ICD-10-CM | POA: Diagnosis not present

## 2021-08-31 DIAGNOSIS — I10 Essential (primary) hypertension: Secondary | ICD-10-CM

## 2021-08-31 DIAGNOSIS — N184 Chronic kidney disease, stage 4 (severe): Secondary | ICD-10-CM

## 2021-08-31 DIAGNOSIS — I4819 Other persistent atrial fibrillation: Secondary | ICD-10-CM

## 2021-08-31 LAB — BASIC METABOLIC PANEL
Anion gap: 10 (ref 5–15)
BUN: 70 mg/dL — ABNORMAL HIGH (ref 8–23)
CO2: 18 mmol/L — ABNORMAL LOW (ref 22–32)
Calcium: 11.2 mg/dL — ABNORMAL HIGH (ref 8.9–10.3)
Chloride: 112 mmol/L — ABNORMAL HIGH (ref 98–111)
Creatinine, Ser: 3.08 mg/dL — ABNORMAL HIGH (ref 0.61–1.24)
GFR, Estimated: 19 mL/min — ABNORMAL LOW (ref >60)
Glucose, Bld: 78 mg/dL (ref 70–99)
Potassium: 4.1 mmol/L (ref 3.5–5.1)
Sodium: 140 mmol/L (ref 135–145)

## 2021-08-31 LAB — ECHOCARDIOGRAM COMPLETE
AR max vel: 1.55 cm2
AV Area VTI: 1.29 cm2
AV Area mean vel: 1.41 cm2
AV Mean grad: 3 mmHg
AV Peak grad: 5.6 mmHg
Ao pk vel: 1.18 m/s
Area-P 1/2: 7.99 cm2
Calc EF: 46.6 %
MV M vel: 5.84 m/s
MV Peak grad: 136.4 mmHg
MV Vena cont: 0.3 cm
Radius: 0.4 cm
S' Lateral: 3 cm
Single Plane A2C EF: 48.5 %
Single Plane A4C EF: 45.8 %
Weight: 3241.6 oz

## 2021-08-31 LAB — MAGNESIUM: Magnesium: 2.2 mg/dL (ref 1.7–2.4)

## 2021-08-31 MED ORDER — UMECLIDINIUM BROMIDE 62.5 MCG/ACT IN AEPB
1.0000 | INHALATION_SPRAY | Freq: Every day | RESPIRATORY_TRACT | Status: DC
  Administered 2021-08-31 – 2021-09-02 (×3): 1 via RESPIRATORY_TRACT
  Filled 2021-08-31: qty 7

## 2021-08-31 MED ORDER — DILTIAZEM HCL ER COATED BEADS 240 MG PO CP24
240.0000 mg | ORAL_CAPSULE | Freq: Every day | ORAL | Status: DC
  Administered 2021-08-31 – 2021-09-03 (×4): 240 mg via ORAL
  Filled 2021-08-31 (×4): qty 1

## 2021-08-31 MED ORDER — GABAPENTIN 100 MG PO CAPS
100.0000 mg | ORAL_CAPSULE | Freq: Every day | ORAL | Status: DC
  Administered 2021-08-31 – 2021-09-03 (×4): 100 mg via ORAL
  Filled 2021-08-31 (×4): qty 1

## 2021-08-31 MED ORDER — FLUTICASONE FUROATE-VILANTEROL 100-25 MCG/ACT IN AEPB
1.0000 | INHALATION_SPRAY | Freq: Every day | RESPIRATORY_TRACT | Status: DC
  Administered 2021-08-31 – 2021-09-02 (×3): 1 via RESPIRATORY_TRACT
  Filled 2021-08-31: qty 28

## 2021-08-31 MED ORDER — FINASTERIDE 5 MG PO TABS
5.0000 mg | ORAL_TABLET | Freq: Every day | ORAL | Status: DC
  Administered 2021-08-31 – 2021-09-03 (×4): 5 mg via ORAL
  Filled 2021-08-31 (×4): qty 1

## 2021-08-31 MED ORDER — APIXABAN 2.5 MG PO TABS
2.5000 mg | ORAL_TABLET | Freq: Two times a day (BID) | ORAL | Status: DC
  Administered 2021-08-31 – 2021-09-03 (×6): 2.5 mg via ORAL
  Filled 2021-08-31 (×6): qty 1

## 2021-08-31 MED ORDER — LABETALOL HCL 100 MG PO TABS
100.0000 mg | ORAL_TABLET | Freq: Two times a day (BID) | ORAL | Status: DC
  Administered 2021-08-31 – 2021-09-02 (×3): 100 mg via ORAL
  Filled 2021-08-31 (×4): qty 1

## 2021-08-31 MED ORDER — CINACALCET HCL 30 MG PO TABS
30.0000 mg | ORAL_TABLET | Freq: Every day | ORAL | Status: DC
  Administered 2021-08-31 – 2021-09-03 (×4): 30 mg via ORAL
  Filled 2021-08-31 (×4): qty 1

## 2021-08-31 MED ORDER — TAMSULOSIN HCL 0.4 MG PO CAPS
0.4000 mg | ORAL_CAPSULE | Freq: Every day | ORAL | Status: DC
  Administered 2021-08-31 – 2021-09-03 (×4): 0.4 mg via ORAL
  Filled 2021-08-31 (×4): qty 1

## 2021-08-31 MED ORDER — PANTOPRAZOLE SODIUM 40 MG PO TBEC
40.0000 mg | DELAYED_RELEASE_TABLET | Freq: Every day | ORAL | Status: DC
  Administered 2021-08-31 – 2021-09-03 (×4): 40 mg via ORAL
  Filled 2021-08-31 (×4): qty 1

## 2021-08-31 MED ORDER — ISOSORBIDE DINITRATE 10 MG PO TABS
30.0000 mg | ORAL_TABLET | Freq: Three times a day (TID) | ORAL | Status: DC
  Administered 2021-08-31 – 2021-09-03 (×7): 30 mg via ORAL
  Filled 2021-08-31 (×8): qty 3

## 2021-08-31 MED ORDER — FUROSEMIDE 10 MG/ML IJ SOLN
40.0000 mg | Freq: Two times a day (BID) | INTRAMUSCULAR | Status: DC
  Administered 2021-08-31 – 2021-09-01 (×3): 40 mg via INTRAVENOUS
  Filled 2021-08-31 (×3): qty 4

## 2021-08-31 NOTE — Progress Notes (Signed)
[facility]) Hospital Liaison note:  This patient is currently enrolled in [facility] outpatient-based Palliative Care. Will continue to follow for disposition.  Please call with any outpatient palliative questions or concerns.  Thank you, Lorelee Market, LPN [facility] Liaison [phone]

## 2021-08-31 NOTE — Telephone Encounter (Signed)
Spoke with pts son and he stated the patient is in the hospital at the time and he would call back to schedule shoe pick up. Advised him that the patients CMN expires 10/17/2021 and if he would be coming to [place] after that date we would need to resend the paperwork to the doctor to restart CMN process.

## 2021-08-31 NOTE — Progress Notes (Signed)
PROGRESS NOTE    Roy [name]  YKD:[id number] DOB: [birth year] DOA: 08/30/2021 PCP: Rogers Blocker, MD   Brief Narrative: No notes on file   Assessment and Plan:  Acute diastolic heart failure Mild symptoms per chart history. No hypoxia. No chest pain or dyspnea currently. Given a dose of Lasix on admission. No admission weight obtained. Transthoracic Echocardiogram ordered and is pending. PT/OT ordered and are pending. -Lasix 40 mg BID -Follow-up Transthoracic Echocardiogram -Measure weight now and measure daily weights and strict in/out -PT/OT eval  Uncontrolled hypertension Primary hypertension -Resume home diltiazem and isosorbide dinitrate  Elevated troponin Mild elevation up to 103 and flat. No chest pain. In setting of diastolic heart failure. Likely demand ischemia.  Persistent atrial fibrillation -Continue Eliquis and diltiazem  Asthma COPD -Continue Breo Ellipta/Incruse Ellipta (substituted for home Trelegy)  Diabetes mellitus, type 2 Patient is on diet control as an outpatient. Hemoglobin A1C of 5.5% from 11/22. -Carb modified diet  BPH -Continue finasteride and tamsulosin  CKD stage IV Stable.  OSA Noted.  Hypercalcemia Likely secondary hypercalcemia in setting of CKD. Patient is on Sensipar as an outpatient. -Continue Sensipar  QT prolongation Mildly elevated. Avoid QT prolonging medications as able   DVT prophylaxis: Eliquis Code Status:   Code Status: Full Code Family Communication: Son on telephone but no answer Disposition Plan: Discharge likely back home in 24 hours pending PT/OT recommendations, Transthoracic Echocardiogram results   Consultants:  None  Procedures:  None  Antimicrobials: None    Subjective: Patient reports no chest pain or dyspnea.  Objective: BP (!) 176/83 (BP Location: Left Arm)   Pulse (!) 104   Temp (!) 97.5 F (36.4 C) (Oral)   Resp 20   Ht '5\' 10"'$  (1.778 m)   Wt 87.4 kg   SpO2 97%   BMI  27.65 kg/m   Examination:  General exam: Appears calm and comfortable Respiratory system: Diminished without over rales on anterior auscultation. Respiratory effort normal. Cardiovascular system: S1 & S2 heard. Gastrointestinal system: Abdomen is nondistended, soft and nontender. No organomegaly or masses felt. Normal bowel sounds heard. Central nervous system: Alert and oriented. No focal neurological deficits. Musculoskeletal: BLE 2+ edema up to thighs. No calf tenderness Skin: No cyanosis. No rashes Psychiatry: Judgement and insight appear normal. Mood & affect appropriate.    Data Reviewed: I have personally reviewed following labs and imaging studies  CBC Lab Results  Component Value Date   WBC 7.5 08/30/2021   RBC 3.60 (L) 08/30/2021   HGB 11.4 (L) 08/30/2021   HCT 35.8 (L) 08/30/2021   MCV 99.4 08/30/2021   MCH 31.7 08/30/2021   PLT 216 08/30/2021   MCHC 31.8 08/30/2021   RDW 14.5 08/30/2021   LYMPHSABS 0.6 (L) 08/30/2021   MONOABS 0.3 08/30/2021   EOSABS 0.0 08/30/2021   BASOSABS 0.0 39/76/7341     Last metabolic panel Lab Results  Component Value Date   NA 140 08/31/2021   K 4.1 08/31/2021   CL 112 (H) 08/31/2021   CO2 18 (L) 08/31/2021   BUN 70 (H) 08/31/2021   CREATININE 3.08 (H) 08/31/2021   GLUCOSE 78 08/31/2021   GFRNONAA 19 (L) 08/31/2021   GFRAA 38 (L) 11/18/2019   CALCIUM 11.2 (H) 08/31/2021   PHOS 2.6 02/24/2021   PROT 4.6 (L) 02/21/2021   ALBUMIN 2.8 (L) 02/24/2021   BILITOT 1.4 (H) 02/21/2021   ALKPHOS 70 02/21/2021   AST 37 02/21/2021   ALT 42 02/21/2021   ANIONGAP 10  08/31/2021    GFR: Estimated Creatinine Clearance: 18.8 mL/min (A) (by C-G formula based on SCr of 3.08 mg/dL (H)).  No results found for this or any previous visit (from the past 240 hour(s)).    Radiology Studies: DG Chest 2 View  Result Date: 08/30/2021 CLINICAL DATA:  Leg swelling.  Shortness of breath EXAM: CHEST - 2 VIEW COMPARISON:  10/07/2015 FINDINGS:  reverse apical lordotic frontal positioning. Midline trachea. Normal heart size. No pleural effusion or pneumothorax. Pulmonary interstitial prominence is favored to be due to AP portable technique and low lung volumes. No overt congestive failure. There may be mild subsegmental atelectasis at the lung bases. IMPRESSION: Low lung volumes, without convincing evidence of acute cardiopulmonary disease. Limited by reverse apical lordotic positioning AP portable technique. Electronically Signed   By: Abigail Miyamoto M.D.   On: 08/30/2021 19:37      LOS: 1 day    Cordelia Poche, MD [facility] 08/31/2021, 3:18 PM   If 7PM-7AM, please contact night-coverage [email or web address]

## 2021-08-31 NOTE — Progress Notes (Addendum)
Heart Failure Stewardship Pharmacist Progress Note   PCP: Rogers Blocker, MD PCP-Cardiologist: None    HPI:  86 yo M with PMH of HF, afb, DVT, asthma/COPD, T2DM, CKD IV, OSA on CPAP, pulmonary HTN, GERD, and HTN. He presented to the ED on 6/8 with LEE and shortness of breath. Notably noncompliant with fluid restrictions and taking medications regularly. CXR without evidence of acute cardiopulmonary disease. ECHO 6/9 with LVEF 45-50%, mildly reduced RV, and mild-moderate MR.  Current HF Medications: Diuretic: furosemide 40 mg IV BID Other: Isoordil 30 mg TID  Prior to admission HF Medications: Diuretic: torsemide 50 mg daily Other: Isordil 30 mg TID  Pertinent Lab Values: Serum creatinine 3.08, BUN 70, Potassium 4.1, Sodium 140, BNP 1072.4, Magnesium 2.2, A1c 5.5 (02/20/21)   Vital Signs: Weight: 192 lbs (admission weight: 192 lbs) Blood pressure: 170/90s  Heart rate: 100s - aflutter  I/O: not recorded yesterday; -1.2L today  Medication Assistance / Insurance Benefits Check: Does the patient have prescription insurance?  Yes Type of insurance plan: [place]:  Prior to admission outpatient pharmacy: [facility] Is the patient willing to use [place] pharmacy at discharge? Yes Is the patient willing to transition their outpatient pharmacy to utilize a [facility] outpatient pharmacy?   Pending    Assessment: 1. Acute on chronic diastolic CHF (LVEF 33-00%). NYHA class III symptoms. - Continue furosemide 40 mg IV BID - given high doses of torsemide PTA, may need higher doses of IV diuresis - watch closely - No ACE/ARB/ARNI, MRA, or SGLT2i with advanced CKD - Continue Isordil 30 mg TID - Consider adding hydralazine 25 mg TID for additional BP reduction   Plan: 1) Medication changes recommended at this time: - Add hydralazine 25 mg TID  2) Patient assistance: - None pending  3)  Education  - To be completed prior to  discharge  Kerby Nora, PharmD, BCPS Heart Failure Stewardship Pharmacist Phone [phone]

## 2021-08-31 NOTE — Progress Notes (Signed)
PT refuses CPAP setup

## 2021-08-31 NOTE — ED Notes (Signed)
Pt bed completely wet from condom cath coming off. Pt cleaned, provided fresh linen and grown. Pt placed back on condom cath. Pt sitting upright in bed with breakfast trey and call bell in reach.

## 2021-08-31 NOTE — ED Notes (Signed)
Roy [name] [facility]) [phone]

## 2021-08-31 NOTE — Progress Notes (Signed)
Echocardiogram 2D Echocardiogram has been performed.  Joette Catching 08/31/2021, 9:24 AM

## 2021-09-01 DIAGNOSIS — I5033 Acute on chronic diastolic (congestive) heart failure: Secondary | ICD-10-CM | POA: Diagnosis not present

## 2021-09-01 DIAGNOSIS — I4819 Other persistent atrial fibrillation: Secondary | ICD-10-CM | POA: Diagnosis not present

## 2021-09-01 DIAGNOSIS — R778 Other specified abnormalities of plasma proteins: Secondary | ICD-10-CM | POA: Diagnosis not present

## 2021-09-01 DIAGNOSIS — E1122 Type 2 diabetes mellitus with diabetic chronic kidney disease: Secondary | ICD-10-CM | POA: Diagnosis not present

## 2021-09-01 LAB — CALCIUM, IONIZED: Calcium, Ionized, Serum: 6.4 mg/dL — ABNORMAL HIGH (ref 4.5–5.6)

## 2021-09-01 LAB — BASIC METABOLIC PANEL
Anion gap: 7 (ref 5–15)
BUN: 74 mg/dL — ABNORMAL HIGH (ref 8–23)
CO2: 22 mmol/L (ref 22–32)
Calcium: 10.6 mg/dL — ABNORMAL HIGH (ref 8.9–10.3)
Chloride: 112 mmol/L — ABNORMAL HIGH (ref 98–111)
Creatinine, Ser: 3.12 mg/dL — ABNORMAL HIGH (ref 0.61–1.24)
GFR, Estimated: 19 mL/min — ABNORMAL LOW (ref >60)
Glucose, Bld: 99 mg/dL (ref 70–99)
Potassium: 4.6 mmol/L (ref 3.5–5.1)
Sodium: 141 mmol/L (ref 135–145)

## 2021-09-01 NOTE — Progress Notes (Signed)
PROGRESS NOTE    Roy [name]  TKW:[id number] DOB: 04/16/1935 DOA: 08/30/2021 PCP: Rogers Blocker, MD   Brief Narrative: Roy [name] is a 86 y.o. male with a history of atrial fibrillation on Eliquis, chronic diastolic heart failure, DVT, asthma/COPD, diet controlled diabetes mellitus type 2, pulmonary hypertension, GERD, hypertension, BPH. Patient presented secondary to leg swelling with evidence of acute on chronic heart failure  Assessment and Plan:  Acute combined systolic and diastolic heart failure Chronic diastolic heart failure Mild symptoms per chart history. No hypoxia. No chest pain or dyspnea currently. Given a dose of Lasix on admission. No admission weight obtained. Transthoracic Echocardiogram significant for reduced LVEF of 45-50% and global hypokinesis. Weight of 87.4 kg (192.68 lbs) on admission. UOP of 2,650 mL in the last 24 hours. -Lasix 40 mg BID -Follow-up Transthoracic Echocardiogram -Daily weights and strict in/out -PT/OT eval  Uncontrolled hypertension Primary hypertension -Resume home diltiazem and isosorbide dinitrate  Elevated troponin Mild elevation up to 103 and flat. No chest pain. In setting of diastolic heart failure. Likely demand ischemia.  Persistent atrial fibrillation -Continue Eliquis and diltiazem  Asthma COPD -Continue Breo Ellipta/Incruse Ellipta (substituted for home Trelegy)  Diabetes mellitus, type 2 Patient is on diet control as an outpatient. Hemoglobin A1C of 5.5% from 11/22. -Carb modified diet  BPH -Continue finasteride and tamsulosin  CKD stage IV Stable.  OSA Noted.  Hypercalcemia Likely secondary hypercalcemia in setting of CKD. Patient is on Sensipar as an outpatient. -Continue Sensipar  QT prolongation Mildly elevated. Avoid QT prolonging medications as able   DVT prophylaxis: Eliquis Code Status:   Code Status: Full Code Family Communication: Son at bedside Disposition Plan: Discharge likely  back home in 2-3 days pending continued Lasix IV diuresis   Consultants:  None  Procedures:  Transthoracic Echocardiogram (6/9)  Antimicrobials: None    Subjective: No chest pain or dyspnea overnight. Feels well this morning.  Objective: BP 121/73 (BP Location: Left Arm)   Pulse 90   Temp 97.9 F (36.6 C) (Oral)   Resp 13   Ht '5\' 10"'$  (1.778 m)   Wt 87.4 kg   SpO2 99%   BMI 27.65 kg/m   Examination:  General exam: Appears calm and comfortable Respiratory system: Clear to auscultation. Respiratory effort normal. Cardiovascular system: S1 & S2 heard. No murmurs. Gastrointestinal system: Abdomen is nondistended, soft and nontender. Normal bowel sounds heard. Central nervous system: Alert. No focal neurological deficits. Musculoskeletal: 2+ BLE pitting edema up to thighs. No calf tenderness Skin: No cyanosis. No rashes Psychiatry: Judgement and insight appear normal. Mood & affect appropriate.    Data Reviewed: I have personally reviewed following labs and imaging studies  CBC Lab Results  Component Value Date   WBC 7.5 08/30/2021   RBC 3.60 (L) 08/30/2021   HGB 11.4 (L) 08/30/2021   HCT 35.8 (L) 08/30/2021   MCV 99.4 08/30/2021   MCH 31.7 08/30/2021   PLT 216 08/30/2021   MCHC 31.8 08/30/2021   RDW 14.5 08/30/2021   LYMPHSABS 0.6 (L) 08/30/2021   MONOABS 0.3 08/30/2021   EOSABS 0.0 08/30/2021   BASOSABS 0.0 92/42/6834     Last metabolic panel Lab Results  Component Value Date   NA 141 09/01/2021   K 4.6 09/01/2021   CL 112 (H) 09/01/2021   CO2 22 09/01/2021   BUN 74 (H) 09/01/2021   CREATININE 3.12 (H) 09/01/2021   GLUCOSE 99 09/01/2021   GFRNONAA 19 (L) 09/01/2021   GFRAA 38 (  L) 11/18/2019   CALCIUM 10.6 (H) 09/01/2021   PHOS 2.6 02/24/2021   PROT 4.6 (L) 02/21/2021   ALBUMIN 2.8 (L) 02/24/2021   BILITOT 1.4 (H) 02/21/2021   ALKPHOS 70 02/21/2021   AST 37 02/21/2021   ALT 42 02/21/2021   ANIONGAP 7 09/01/2021    GFR: Estimated  Creatinine Clearance: 18.5 mL/min (A) (by C-G formula based on SCr of 3.12 mg/dL (H)).  No results found for this or any previous visit (from the past 240 hour(s)).    Radiology Studies: ECHOCARDIOGRAM COMPLETE  Result Date: 08/31/2021    ECHOCARDIOGRAM REPORT   Patient Name:   Roy [name] Date of Exam: 08/31/2021 Medical Rec #:  [id number]         Height:       70.0 in Accession #:    [id number]        Weight:       202.6 lb Date of Birth:  January 13, 1936         BSA:          2.099 m Patient Age:    41 years          BP:           121/84 mmHg Patient Gender: M                 HR:           72 bpm. Exam Location:  Inpatient Procedure: 2D Echo, Cardiac Doppler and Color Doppler Indications:    Congestive heart failure  History:        Patient has prior history of Echocardiogram examinations, most                 recent 02/21/2021. COPD and Pulmonary HTN, Arrythmias:Atrial                 Fibrillation; Risk Factors:Diabetes and Hypertension.  Sonographer:    Joette Catching RCS Referring Phys: [id number] [place]  1. Left ventricular ejection fraction, by estimation, is 45 to 50%. The left ventricle has mildly decreased function. The left ventricle demonstrates global hypokinesis. There is mild left ventricular hypertrophy. Left ventricular diastolic parameters are indeterminate.  2. Right ventricular systolic function is mildly reduced. The right ventricular size is normal. There is severely elevated pulmonary artery systolic pressure. The estimated right ventricular systolic pressure is 14.4 mmHg.  3. Left atrial size was severely dilated.  4. Right atrial size was moderately dilated.  5. The mitral valve is normal in structure. Mild to moderate mitral valve regurgitation. No evidence of mitral stenosis.  6. The aortic valve is tricuspid. There is mild calcification of the aortic valve. Aortic valve regurgitation is not visualized. No aortic stenosis is present.  7. The inferior vena cava is  dilated in size with <50% respiratory variability, suggesting right atrial pressure of 15 mmHg.  8. The patient is in atrial fibrillation. FINDINGS  Left Ventricle: Left ventricular ejection fraction, by estimation, is 45 to 50%. The left ventricle has mildly decreased function. The left ventricle demonstrates global hypokinesis. The left ventricular internal cavity size was normal in size. There is  mild left ventricular hypertrophy. Left ventricular diastolic parameters are indeterminate. Right Ventricle: The right ventricular size is normal. No increase in right ventricular wall thickness. Right ventricular systolic function is mildly reduced. There is severely elevated pulmonary artery systolic pressure. The tricuspid regurgitant velocity is 4.15 m/s, and with an assumed right atrial pressure of 15 mmHg, the estimated  right ventricular systolic pressure is 70.6 mmHg. Left Atrium: Left atrial size was severely dilated. Right Atrium: Right atrial size was moderately dilated. Pericardium: There is no evidence of pericardial effusion. Mitral Valve: The mitral valve is normal in structure. Mild mitral annular calcification. Mild to moderate mitral valve regurgitation. No evidence of mitral valve stenosis. Tricuspid Valve: The tricuspid valve is normal in structure. Tricuspid valve regurgitation is mild. Aortic Valve: The aortic valve is tricuspid. There is mild calcification of the aortic valve. Aortic valve regurgitation is not visualized. No aortic stenosis is present. Aortic valve mean gradient measures 3.0 mmHg. Aortic valve peak gradient measures 5.6 mmHg. Aortic valve area, by VTI measures 1.29 cm. Pulmonic Valve: The pulmonic valve was normal in structure. Pulmonic valve regurgitation is trivial. Aorta: The aortic root is normal in size and structure. Venous: The inferior vena cava is dilated in size with less than 50% respiratory variability, suggesting right atrial pressure of 15 mmHg. IAS/Shunts: No atrial  level shunt detected by color flow Doppler.  LEFT VENTRICLE PLAX 2D LVIDd:         3.80 cm     Diastology LVIDs:         3.00 cm     LV e' medial:    7.61 cm/s LV PW:         1.20 cm     LV E/e' medial:  13.9 LV IVS:        1.30 cm     LV e' lateral:   9.57 cm/s LVOT diam:     1.80 cm     LV E/e' lateral: 11.1 LV SV:         32 LV SV Index:   15 LVOT Area:     2.54 cm  LV Volumes (MOD) LV vol d, MOD A2C: 94.9 ml LV vol d, MOD A4C: 83.7 ml LV vol s, MOD A2C: 48.9 ml LV vol s, MOD A4C: 45.4 ml LV SV MOD A2C:     46.0 ml LV SV MOD A4C:     83.7 ml LV SV MOD BP:      41.5 ml RIGHT VENTRICLE             IVC RV Basal diam:  3.80 cm     IVC diam: 2.30 cm RV Mid diam:    2.40 cm RV S prime:     15.30 cm/s TAPSE (M-mode): 1.8 cm LEFT ATRIUM              Index        RIGHT ATRIUM           Index LA diam:        5.00 cm  2.38 cm/m   RA Area:     26.50 cm LA Vol (A2C):   149.0 ml 70.99 ml/m  RA Volume:   93.10 ml  44.36 ml/m LA Vol (A4C):   139.0 ml 66.23 ml/m LA Biplane Vol: 151.0 ml 71.95 ml/m  AORTIC VALVE                    PULMONIC VALVE AV Area (Vmax):    1.55 cm     PV Vmax:          0.90 m/s AV Area (Vmean):   1.41 cm     PV Peak grad:     3.2 mmHg AV Area (VTI):     1.29 cm     PR End Diast Vel: 5.43 msec AV  Vmax:           118.00 cm/s AV Vmean:          84.400 cm/s AV VTI:            0.248 m AV Peak Grad:      5.6 mmHg AV Mean Grad:      3.0 mmHg LVOT Vmax:         71.70 cm/s LVOT Vmean:        46.600 cm/s LVOT VTI:          0.126 m LVOT/AV VTI ratio: 0.51  AORTA Ao Root diam: 3.10 cm Ao Asc diam:  3.10 cm MITRAL VALVE                    TRICUSPID VALVE MV Area (PHT): 7.99 cm         TR Peak grad:   68.9 mmHg MV Decel Time: 95 msec          TR Vmax:        415.00 cm/s MR Peak grad:      136.4 mmHg MR Mean grad:      91.0 mmHg    SHUNTS MR Vmax:           584.00 cm/s  Systemic VTI:  0.13 m MR Vmean:          454.0 cm/s   Systemic Diam: 1.80 cm MR Vena Contracta: 0.30 cm MR PISA:           1.01 cm MR PISA  Eff ROA:   6 mm MR PISA Radius:    0.40 cm MV E velocity: 106.00 cm/s MV A velocity: 50.30 cm/s MV E/A ratio:  2.11 Dalton McleanMD Electronically signed by Franki Monte Signature Date/Time: 08/31/2021/3:53:57 PM    Final    DG Chest 2 View  Result Date: 08/30/2021 CLINICAL DATA:  Leg swelling.  Shortness of breath EXAM: CHEST - 2 VIEW COMPARISON:  10/07/2015 FINDINGS: reverse apical lordotic frontal positioning. Midline trachea. Normal heart size. No pleural effusion or pneumothorax. Pulmonary interstitial prominence is favored to be due to AP portable technique and low lung volumes. No overt congestive failure. There may be mild subsegmental atelectasis at the lung bases. IMPRESSION: Low lung volumes, without convincing evidence of acute cardiopulmonary disease. Limited by reverse apical lordotic positioning AP portable technique. Electronically Signed   By: Abigail Miyamoto M.D.   On: 08/30/2021 19:37      LOS: 2 days    Cordelia Poche, MD [facility] 09/01/2021, 3:38 PM   If 7PM-7AM, please contact night-coverage [email or web address]

## 2021-09-01 NOTE — Evaluation (Signed)
Physical Therapy Evaluation Patient Details Name: Roy [name] MRN: [id number] DOB: [birth year] Today's Date: 09/01/2021  History of Present Illness  Pt is an 86 y.o. male admitted 08/30/21 for HF exacerbation, uncontrolled HTN, elevated troponin likely demand ischemia; awaiting TTE. PMH includes afib on Eliquis, HF, DVT, asthma, COPD, DM, pulmonary HTN, GERD, HTN, BPH, CKD IV, OSA on CPAP.   Clinical Impression  Pt presents with an overall decrease in functional mobility secondary to above. PTA, pt mod indep ambulating with RW or SPC, lives alone; pt has son who works but checks on pt to assist with ADL/iADLs as needed. Today, pt able to perform brief bout of standing activity with RW and min guard; unable to convince pt to increase ambulation distance this session. May be worth considering ALF/ILF if pt having increased difficulty managing himself at home alone. Will follow acutely to address established goals.   Recommendations for follow up therapy are one component of a multi-disciplinary discharge planning process, led by the attending physician.  Recommendations may be updated based on patient status, additional functional criteria and insurance authorization.  Follow Up Recommendations Home health PT    Assistance Recommended at Discharge Intermittent Supervision/Assistance  Patient can return home with the following  A little help with bathing/dressing/bathroom;Assistance with cooking/housework;Direct supervision/assist for medications management;Direct supervision/assist for financial management;Assist for transportation;Help with stairs or ramp for entrance    Equipment Recommendations None recommended by PT  Recommendations for Other Services   Occupational Therapy   Functional Status Assessment Patient has had a recent decline in their functional status and demonstrates the ability to make significant improvements in function in a reasonable and predictable amount of time.      Precautions / Restrictions Precautions Precautions: Fall Restrictions Weight Bearing Restrictions: No      Mobility  Bed Mobility Overal bed mobility: Modified Independent             General bed mobility comments: HOB elevated    Transfers Overall transfer level: Needs assistance Equipment used: Rolling walker (2 wheels) Transfers: Sit to/from Stand, Bed to chair/wheelchair/BSC Sit to Stand: Min guard   Step pivot transfers: Min guard       General transfer comment: increased effort to stand from EOB to RW, pivotal steps to recliner with RW and min guard for balance first time up; pt declined further mobility    Ambulation/Gait                  Stairs            Wheelchair Mobility    Modified Rankin (Stroke Patients Only)       Balance Overall balance assessment: Needs assistance   Sitting balance-Leahy Scale: Fair     Standing balance support: During functional activity, Reliant on assistive device for balance Standing balance-Leahy Scale: Poor                               Pertinent Vitals/Pain Pain Assessment Pain Assessment: No/denies pain    Home Living Family/patient expects to be discharged to:: Private residence Living Arrangements: Alone Available Help at Discharge: Family;Available PRN/intermittently Type of Home: House Home Access: Stairs to enter   Entrance Stairs-Number of Steps: 1   Home Layout: One level Home Equipment: Cane - single [employer]ista (2 wheels);Wheelchair - manual Additional Comments: has life alert    Prior Function Prior Level of Function : Independent/Modified Independent;History of Falls (last six months)  Mobility Comments: Typically mod indep ambulating with SPC or RW; no longer drives ADLs Comments: Son checks on patient, assists with transportation, meals, med management     Hand Dominance   Dominant Hand: Right    Extremity/Trunk Assessment    Upper Extremity Assessment Upper Extremity Assessment: Generalized weakness    Lower Extremity Assessment Lower Extremity Assessment: Generalized weakness    Cervical / Trunk Assessment Cervical / Trunk Assessment: Kyphotic  Communication   Communication: No difficulties  Cognition Arousal/Alertness: Awake/alert Behavior During Therapy: WFL for tasks assessed/performed Overall Cognitive Status: No family/caregiver present to determine baseline cognitive functioning                                 General Comments: slightly impulsive with movement, suspect decreased safety awareness; following simple commands and answering questions appropriately; very pleasant        General Comments General comments (skin integrity, edema, etc.): VSS on RA; pt requesting use of his RW over [facility] when offered both    Exercises     Assessment/Plan    PT Assessment Patient needs continued PT services  PT Problem List Decreased strength;Decreased activity tolerance;Decreased balance;Decreased mobility;Decreased cognition;Decreased knowledge of use of DME;Cardiopulmonary status limiting activity       PT Treatment Interventions DME instruction;Gait training;Stair training;Functional mobility training;Therapeutic activities;Therapeutic exercise;Balance training;Patient/family education    PT Goals (Current goals can be found in the Care Plan section)  Acute Rehab PT Goals Patient Stated Goal: return home PT Goal Formulation: With patient Time For Goal Achievement: 09/15/21 Potential to Achieve Goals: Good    Frequency Min 3X/week     Co-evaluation               AM-PAC PT "6 Clicks" Mobility  Outcome Measure Help needed turning from your back to your side while in a flat bed without using bedrails?: None Help needed moving from lying on your back to sitting on the side of a flat bed without using bedrails?: A Little Help needed moving to and from a bed to a chair  (including a wheelchair)?: A Little Help needed standing up from a chair using your arms (e.g., wheelchair or bedside chair)?: A Little Help needed to walk in hospital room?: A Little Help needed climbing 3-5 steps with a railing? : A Little 6 Click Score: 19    End of Session   Activity Tolerance: Patient tolerated treatment well;Patient limited by fatigue Patient left: in chair;with call bell/phone within reach;with chair alarm set;with nursing/sitter in room Nurse Communication: Mobility status PT Visit Diagnosis: Other abnormalities of gait and mobility (R26.89)    Time: 0912-0930 PT Time Calculation (min) (ACUTE ONLY): 18 min   Charges:   PT Evaluation $PT Eval Moderate Complexity: [place], PT, DPT Acute Rehabilitation Services  Pager [phone] Office [place] 09/01/2021, 10:39 AM

## 2021-09-02 DIAGNOSIS — I5033 Acute on chronic diastolic (congestive) heart failure: Secondary | ICD-10-CM | POA: Diagnosis not present

## 2021-09-02 DIAGNOSIS — R778 Other specified abnormalities of plasma proteins: Secondary | ICD-10-CM | POA: Diagnosis not present

## 2021-09-02 DIAGNOSIS — E1122 Type 2 diabetes mellitus with diabetic chronic kidney disease: Secondary | ICD-10-CM | POA: Diagnosis not present

## 2021-09-02 DIAGNOSIS — I4819 Other persistent atrial fibrillation: Secondary | ICD-10-CM | POA: Diagnosis not present

## 2021-09-02 LAB — BASIC METABOLIC PANEL
Anion gap: 9 (ref 5–15)
BUN: 74 mg/dL — ABNORMAL HIGH (ref 8–23)
CO2: 21 mmol/L — ABNORMAL LOW (ref 22–32)
Calcium: 10.3 mg/dL (ref 8.9–10.3)
Chloride: 111 mmol/L (ref 98–111)
Creatinine, Ser: 3.35 mg/dL — ABNORMAL HIGH (ref 0.61–1.24)
GFR, Estimated: 18 mL/min — ABNORMAL LOW (ref >60)
Glucose, Bld: 141 mg/dL — ABNORMAL HIGH (ref 70–99)
Potassium: 3.9 mmol/L (ref 3.5–5.1)
Sodium: 141 mmol/L (ref 135–145)

## 2021-09-02 MED ORDER — LABETALOL HCL 100 MG PO TABS
100.0000 mg | ORAL_TABLET | Freq: Two times a day (BID) | ORAL | Status: DC
  Administered 2021-09-03: 100 mg via ORAL
  Filled 2021-09-02: qty 1

## 2021-09-02 MED ORDER — FUROSEMIDE 10 MG/ML IJ SOLN
40.0000 mg | Freq: Every day | INTRAMUSCULAR | Status: DC
  Administered 2021-09-02 – 2021-09-03 (×2): 40 mg via INTRAVENOUS
  Filled 2021-09-02 (×2): qty 4

## 2021-09-02 NOTE — Evaluation (Signed)
Occupational Therapy Evaluation Patient Details Name: Roy [name] MRN: [id number] DOB: 09-Jul-1935 Today's Date: 09/02/2021   History of Present Illness Pt is an 86 y.o. male admitted 08/30/21 for HF exacerbation, uncontrolled HTN, elevated troponin likely demand ischemia; awaiting TTE. PMH includes afib on Eliquis, HF, DVT, asthma, COPD, DM, pulmonary HTN, GERD, HTN, BPH, CKD IV, OSA on CPAP.   Clinical Impression   Pt admitted for concerns listed above. PTA pt reported that he was independent with all ADL's and functional mobility using a RW. At this time, pt presents with cognitive deficits, mild weakness and balance deficits. Physically pt is able to complete ADL's and mobility with min guard, however due to cognition, he is requiring multimodal cuing for safety, impulsivity, and difficulty maintaining attention on tasks. Recommending SNF at this time, due to cognitive deficits affecting his safety at home alone. If pt is able to have all meals prepared for him, medications checked and managed daily, as well as well checks daily for hygiene, pt could go home with [place], [place], and HHPT. OT will continue to follow acutely.      Recommendations for follow up therapy are one component of a multi-disciplinary discharge planning process, led by the attending physician.  Recommendations may be updated based on patient status, additional functional criteria and insurance authorization.   Follow Up Recommendations  Skilled nursing-short term rehab (<3 hours/day)    Assistance Recommended at Discharge Intermittent Supervision/Assistance  Patient can return home with the following A little help with walking and/or transfers;A little help with bathing/dressing/bathroom;Assistance with cooking/housework;Direct supervision/assist for medications management;Direct supervision/assist for financial management;Assist for transportation;Help with stairs or ramp for entrance    Functional Status  Assessment  Patient has had a recent decline in their functional status and demonstrates the ability to make significant improvements in function in a reasonable and predictable amount of time.  Equipment Recommendations  BSC/3in1;Tub/shower bench    Recommendations for Other Services       Precautions / Restrictions Precautions Precautions: Fall Restrictions Weight Bearing Restrictions: No      Mobility Bed Mobility Overal bed mobility: Modified Independent             General bed mobility comments: HOB elevated    Transfers Overall transfer level: Needs assistance Equipment used: Rolling walker (2 wheels) Transfers: Sit to/from Stand Sit to Stand: Min guard           General transfer comment: Min gaurd for physical aspect of standing, min - mod A verbal cuing for sequencing and safety      Balance Overall balance assessment: Needs assistance Sitting-balance support: Single extremity supported, Feet supported Sitting balance-Leahy Scale: Fair     Standing balance support: Reliant on assistive device for balance Standing balance-Leahy Scale: Poor                             ADL either performed or assessed with clinical judgement   ADL Overall ADL's : Needs assistance/impaired Eating/Feeding: Set up;Sitting   Grooming: Set up;Sitting   Upper Body Bathing: Set up;Sitting   Lower Body Bathing: Minimal assistance;Sitting/lateral leans;Sit to/from stand   Upper Body Dressing : Set up;Sitting   Lower Body Dressing: Minimal assistance;Sitting/lateral leans;Sit to/from stand   Toilet Transfer: Minimal assistance;Stand-pivot   Toileting- Clothing Manipulation and Hygiene: Minimal assistance;Sitting/lateral lean;Sit to/from stand       Functional mobility during ADLs: Min guard;Minimal assistance;Rolling walker (2 wheels) General ADL Comments: Pt  limited mainly by cognition, requiring verbal cuing and tactile cuing for all tasks      Vision Baseline Vision/History: 1 Wears glasses Ability to See in Adequate Light: 0 Adequate Patient Visual Report: No change from baseline Vision Assessment?: No apparent visual deficits     Perception     Praxis      Pertinent Vitals/Pain Pain Assessment Pain Assessment: No/denies pain     Hand Dominance Right   Extremity/Trunk Assessment Upper Extremity Assessment Upper Extremity Assessment: Generalized weakness   Lower Extremity Assessment Lower Extremity Assessment: Generalized weakness   Cervical / Trunk Assessment Cervical / Trunk Assessment: Kyphotic   Communication Communication Communication: No difficulties   Cognition Arousal/Alertness: Awake/alert Behavior During Therapy: WFL for tasks assessed/performed Overall Cognitive Status: Impaired/Different from baseline Area of Impairment: Orientation, Memory, Following commands, Safety/judgement, Problem solving                 Orientation Level: Disoriented to, Situation, Place   Memory: Decreased short-term memory Following Commands: Follows one step commands with increased time, Follows multi-step commands inconsistently Safety/Judgement: Decreased awareness of safety, Decreased awareness of deficits   Problem Solving: Slow processing, Requires verbal cues, Requires tactile cues General Comments: Pt perseverating on his cell phone, requiring consistent redirection, unable to use his phone or state what he needed to do on his phone. With tactile and verbal cuing pt sat EOB, however he then started to ask where he was and why he was at the hospital. Pt with difficulty comprehending situation or needs. Additionally, once moving pt was impulsive with decreased safety awareness.     General Comments  VSS on RA, pt appearing more confused than in session with PT, informed RN, who also stated that he has not been O&A this morning for her either.    Exercises     Shoulder Instructions      Home  Living Family/patient expects to be discharged to:: Private residence Living Arrangements: Alone Available Help at Discharge: Family;Available PRN/intermittently Type of Home: House Home Access: Stairs to enter Entrance Stairs-Number of Steps: 1   Home Layout: One level     Bathroom Shower/Tub: Tub/shower unit;Walk-in shower   Bathroom Toilet: Handicapped height     Home Equipment: [place] - single Barista (2 wheels);Wheelchair - manual   Additional Comments: has life alert      Prior Functioning/Environment Prior Level of Function : Independent/Modified Independent;History of Falls (last six months)             Mobility Comments: Typically mod indep ambulating with SPC or RW; no longer drives ADLs Comments: Son checks on patient, assists with transportation, meals, med management        OT Problem List: Decreased strength;Decreased activity tolerance;Impaired balance (sitting and/or standing);Decreased cognition;Decreased safety awareness;Decreased knowledge of use of DME or AE;Cardiopulmonary status limiting activity      OT Treatment/Interventions: Self-care/ADL training;Therapeutic exercise;Energy conservation;DME and/or AE instruction;Therapeutic activities;Cognitive remediation/compensation;Patient/family education;Balance training    OT Goals(Current goals can be found in the care plan section) Acute Rehab OT Goals Patient Stated Goal: To go home OT Goal Formulation: With patient Time For Goal Achievement: 09/16/21 Potential to Achieve Goals: Good ADL Goals Pt Will Transfer to Toilet: with modified independence;ambulating Additional ADL Goal #1: Pt will complete a multistep pathfinding ADL task, independently. Additional ADL Goal #2: Pt will follow 2-3 step commands 100% of the session.  OT Frequency: Min 2X/week    Co-evaluation  AM-PAC OT "6 Clicks" Daily Activity     Outcome Measure Help from another person eating meals?: A  Little Help from another person taking care of personal grooming?: A Little Help from another person toileting, which includes using toliet, bedpan, or urinal?: A Little Help from another person bathing (including washing, rinsing, drying)?: A Little Help from another person to put on and taking off regular upper body clothing?: A Little Help from another person to put on and taking off regular lower body clothing?: A Little 6 Click Score: 18   End of Session Equipment Utilized During Treatment: Gait belt;Rolling walker (2 wheels) Nurse Communication: Mobility status  Activity Tolerance: Patient tolerated treatment well Patient left: in bed;with call bell/phone within reach;with bed alarm set  OT Visit Diagnosis: Unsteadiness on feet (R26.81);Other abnormalities of gait and mobility (R26.89);Muscle weakness (generalized) (M62.81)                Time: 3300-7622 OT Time Calculation (min): 23 min Charges:  OT General Charges $OT Visit: 1 Visit OT Evaluation $OT Eval Moderate Complexity: 1 Mod OT Treatments $Self Care/Home Management : 8-22 mins  Bion Todorov H., OTR/L Acute Rehabilitation  Male Minish Elane Leroy Trim 09/02/2021, 12:54 PM

## 2021-09-02 NOTE — Progress Notes (Signed)
HR has been 50-60's this evening.  BP 115/66.  Discussed with Dr. Fabio Neighbors.  Will add hold parameters for Labetalol and Isordil and hold both medications tonight. Ayesha Mohair BSN RN [place] 09/02/2021, 11:21 PM

## 2021-09-02 NOTE — Progress Notes (Signed)
Pharmacist Heart Failure Core Measure Documentation  Assessment: Roy [name] has an EF documented as 45-50% on 08/31/2021 by Dr. Aundra Dubin.  Rationale: Heart failure patients with left ventricular systolic dysfunction (LVSD) and an EF < 40% should be prescribed an angiotensin converting enzyme inhibitor (ACEI) or angiotensin receptor blocker (ARB) at discharge unless a contraindication is documented in the medical record.  This patient is not currently on an ACEI or ARB for HF.  This note is being placed in the record in order to provide documentation that a contraindication to the use of these agents is present for this encounter.  ACE Inhibitor or Angiotensin Receptor Blocker is contraindicated (specify all that apply)  '[]'$   ACEI allergy AND ARB allergy '[]'$   Angioedema '[]'$   Moderate or severe aortic stenosis '[]'$   Hyperkalemia '[]'$   Hypotension '[]'$   Renal artery stenosis '[x]'$   Worsening renal function, preexisting renal disease or dysfunction   Anderson Malta A Cicero Noy 09/02/2021 3:05 PM

## 2021-09-02 NOTE — Progress Notes (Addendum)
PROGRESS NOTE    Roy [name]  JIR:[id number] DOB: 1936/02/15 DOA: 08/30/2021 PCP: Rogers Blocker, MD   Brief Narrative: Roy [name] is a 86 y.o. male with a history of atrial fibrillation on Eliquis, chronic diastolic heart failure, DVT, asthma/COPD, diet controlled diabetes mellitus type 2, pulmonary hypertension, GERD, hypertension, BPH. Patient presented secondary to leg swelling with evidence of acute on chronic heart failure  Assessment and Plan:  Acute combined systolic and diastolic heart failure Chronic diastolic heart failure Mild symptoms per chart history. No hypoxia. No chest pain or dyspnea currently. Given a dose of Lasix on admission. No admission weight obtained. Transthoracic Echocardiogram significant for reduced LVEF of 45-50% and global hypokinesis. Weight of 85.9 kg (189.3 lbs) on admission. UOP of 2,200 mL in the last 24 hours. -Some soft blood pressure and creatinine trending up; decrease to Lasix 40 mg daily -Daily weights and strict in/out -PT/OT eval  Uncontrolled hypertension Primary hypertension Soft blood pressure overnight. Home Imdur held. -Continue diltiazem  Elevated troponin Mild elevation up to 103 and flat. No chest pain. In setting of diastolic heart failure. Likely demand ischemia.  Persistent atrial fibrillation -Continue Eliquis and diltiazem  Asthma COPD -Continue Breo Ellipta/Incruse Ellipta (substituted for home Trelegy)  Diabetes mellitus, type 2 Patient is on diet control as an outpatient. Hemoglobin A1C of 5.5% from 11/22. -Carb modified diet  BPH -Continue finasteride and tamsulosin  CKD stage IV Stable. Baseline creatinine is about 3.3.   OSA Noted.  Hypercalcemia Likely secondary hypercalcemia in setting of CKD. Patient is on Sensipar as an outpatient. -Continue Sensipar  QT prolongation Mildly elevated. Avoid QT prolonging medications as able   DVT prophylaxis: Eliquis Code Status:   Code Status: Full  Code Family Communication: None at bedside Disposition Plan: Discharge likely back home in 1-3 days pending continued Lasix IV diuresis   Consultants:  None  Procedures:  Transthoracic Echocardiogram (6/9)  Antimicrobials: None    Subjective: No chest pain or dyspnea overnight. Feels well this morning.  Objective: BP (!) 146/69 (BP Location: Left Arm)   Pulse 61   Temp (!) 97.4 F (36.3 C) (Oral)   Resp 18   Ht '5\' 10"'$  (1.778 m)   Wt 85.9 kg   SpO2 100%   BMI 27.16 kg/m   Examination:  General exam: Appears calm and comfortable Respiratory system: Clear to auscultation. Respiratory effort normal. Cardiovascular system: S1 & S2 heard, RRR. No murmurs, rubs, gallops or clicks. Gastrointestinal system: Abdomen is nondistended, soft and nontender. Normal bowel sounds heard. Central nervous system: Alert. No focal neurological deficits. Musculoskeletal: Significant pitting edema up to thighs bilaterally. No calf tenderness Skin: No cyanosis. No rashes Psychiatry: Judgement and insight appear normal. Mood & affect appropriate.     Data Reviewed: I have personally reviewed following labs and imaging studies  CBC Lab Results  Component Value Date   WBC 7.5 08/30/2021   RBC 3.60 (L) 08/30/2021   HGB 11.4 (L) 08/30/2021   HCT 35.8 (L) 08/30/2021   MCV 99.4 08/30/2021   MCH 31.7 08/30/2021   PLT 216 08/30/2021   MCHC 31.8 08/30/2021   RDW 14.5 08/30/2021   LYMPHSABS 0.6 (L) 08/30/2021   MONOABS 0.3 08/30/2021   EOSABS 0.0 08/30/2021   BASOSABS 0.0 75/12/2583     Last metabolic panel Lab Results  Component Value Date   NA 141 09/02/2021   K 3.9 09/02/2021   CL 111 09/02/2021   CO2 21 (L) 09/02/2021   BUN  74 (H) 09/02/2021   CREATININE 3.35 (H) 09/02/2021   GLUCOSE 141 (H) 09/02/2021   GFRNONAA 18 (L) 09/02/2021   GFRAA 38 (L) 11/18/2019   CALCIUM 10.3 09/02/2021   PHOS 2.6 02/24/2021   PROT 4.6 (L) 02/21/2021   ALBUMIN 2.8 (L) 02/24/2021   BILITOT 1.4  (H) 02/21/2021   ALKPHOS 70 02/21/2021   AST 37 02/21/2021   ALT 42 02/21/2021   ANIONGAP 9 09/02/2021    GFR: Estimated Creatinine Clearance: 17.3 mL/min (A) (by C-G formula based on SCr of 3.35 mg/dL (H)).  No results found for this or any previous visit (from the past 240 hour(s)).    Radiology Studies: No results found.    LOS: 3 days    Cordelia Poche, MD [facility] 09/02/2021, 1:16 PM   If 7PM-7AM, please contact night-coverage [email or web address]

## 2021-09-03 ENCOUNTER — Encounter (HOSPITAL_COMMUNITY): Payer: Self-pay | Admitting: Internal Medicine

## 2021-09-03 DIAGNOSIS — I5023 Acute on chronic systolic (congestive) heart failure: Secondary | ICD-10-CM

## 2021-09-03 LAB — BASIC METABOLIC PANEL
Anion gap: 9 (ref 5–15)
BUN: 71 mg/dL — ABNORMAL HIGH (ref 8–23)
CO2: 23 mmol/L (ref 22–32)
Calcium: 9.9 mg/dL (ref 8.9–10.3)
Chloride: 110 mmol/L (ref 98–111)
Creatinine, Ser: 3.5 mg/dL — ABNORMAL HIGH (ref 0.61–1.24)
GFR, Estimated: 17 mL/min — ABNORMAL LOW (ref >60)
Glucose, Bld: 150 mg/dL — ABNORMAL HIGH (ref 70–99)
Potassium: 3.9 mmol/L (ref 3.5–5.1)
Sodium: 142 mmol/L (ref 135–145)

## 2021-09-03 NOTE — TOC Progression Note (Addendum)
Transition of Care [facility]) - Progression Note    Patient Details  Name: Roy [name] MRN: [id number] Date of Birth: 18-Mar-1936  Transition of Care [facility]) CM/SW Contact  Zenon Mayo, RN Phone Number: 09/03/2021, 11:45 AM  Clinical Narrative:    NCM spoke with patient, he gave this NCM permission to call his son.  NCM spoke with son, he states patient has a walker, cane and cpap.  He has a Marine scientist that comes by once a month with [facility] -outpatient palliative services to check on him.  Son states the plan is for patient to go to his aunt's home to stay with her at [place], [place] [address].  NCM offered choice for [facility] son states he does not have a preference.  NCM made referral to [facility] with [facility].  Awaiting to hear back. Son states he will transport patient home at dc. NCM notified Shanita with [facility] patient maybe for possible dc today or tomorrow. TOC willl contine to follow. Per Anderson Malta she can not take referral.  NCM made referral to [facility] with [place], he states he can take referral.  Soc will begin 24 to 48 hrs post dc.    Expected Discharge Plan: [place] Barriers to Discharge: No Barriers Identified  Expected Discharge Plan and Services Expected Discharge Plan: [place]   Discharge Planning Services: CM Consult Post Acute Care Choice: [place] arrangements for the past 2 months: Single Family Home                   DME Agency: NA       HH Arranged: RN, Disease Management, PT, OT, Nurse's Aide [place] Date [place] Agency Contacted: 09/03/21 Time [place]: 1143 Representative spoke with at [place] ([place]) Interventions    Readmission Risk Interventions     No data to display

## 2021-09-03 NOTE — Progress Notes (Signed)
Heart Failure Nurse Navigator Progress Note  PCP: Rogers Blocker, MD PCP-Cardiologist:  Admission Diagnosis: Acute on chronic heart failure.  Admitted from: home  Presentation:   Herma Carson presented with leg swelling and irregular heart rate, has not been taking his medications for awhile. Lives alone, seen by a home health nurse who was worried about his leg swelling. BP 185/118, HR 101, 3+ bilateral lower extremity edema. BNP 1072, Troponin 103, IV lasix given, admitted.   Patient educated on heart failure the sign and symptoms, daily weights, diet/ fluid restrictions, spoke about better options when eating out, the importance of taking all his medication as prescribed, attending all his medical appointments. Patient voiced his understanding. Has a hospital follow up with HF TOC on 09/14/21.   ECHO/ LVEF: 45-50%  Clinical Course:  Past Medical History:  Diagnosis Date   A-fib ([place])    Abscess of external ear, left 01/11/2016   Acute deep vein thrombosis (DVT) of femoral vein of left lower extremity (HCC)    VQ scan negative/low probability   Arthritis HANDS/ THUMBS/ SHOULDERS   Bacteremia due to Enterococcus 10/09/2015   Bladder calculi    Chronic obstructive asthma, unspecified FOLLOWED BY DR Tarri Fuller YOUNG  VISIT 10-14-2011 IN [facility]IC   COPD (chronic obstructive pulmonary disease) (HCC)    Cyst on ear    left   Diabetes mellitus    DVT (deep venous thrombosis) (HCC)    Dyspnea on exertion    GERD (gastroesophageal reflux disease)    Hematuria    History of prostate cancer 2010-- S/P  EXTERNAL RADIATION   Hydronephrosis, left    Hypertension    Infected cyst of skin 01/25/2016   OSA on CPAP    wears CPAP   Sepsis ([place])    Sepsis secondary to UTI ([place]) 10/06/2015   UTI (lower urinary tract infection) 10/06/2015   Wears glasses      Social History   Socioeconomic History   Marital status: Widowed    Spouse name: Not on file   Number of children: 3   Years of  education: Not on file   Highest education level: Not on file  Occupational History   Occupation: Retired    Comment: formerly Production assistant, radio  Tobacco Use   Smoking status: Former    Packs/day: 0.50    Years: 25.00    Total pack years: 12.50    Types: Cigarettes    Quit date: 03/25/1998    Years since quitting: 23.4   Smokeless tobacco: Never  Vaping Use   Vaping Use: Never used  Substance and Sexual Activity   Alcohol use: Not Currently   Drug use: No   Sexual activity: Not on file  Other Topics Concern   Not on file  Social History Narrative   Not on file   Social Determinants of Health   Financial Resource Strain: Not on file  Food Insecurity: Not on file  Transportation Needs: Not on file  Physical Activity: Not on file  Stress: Not on file  Social Connections: Not on file    High Risk Criteria for Readmission and/or Poor Patient Outcomes: Heart failure hospital admissions (last 6 months):1 No Show rate: 7 Difficult social situation: no Demonstrates medication adherence: no Primary Language: english Literacy level: reading, writing, and comprehension.   Barriers of Care:   Diet/ fluid restrictions (eats out a lot) Medication compliance Daily weights  Considerations/Referrals:   Referral made to Heart Failure Pharmacist Stewardship: yes Referral made to Heart  Failure CSW/NCM TOC: no Referral made to Heart & Vascular TOC clinic: yes , 09/14/21  Items for Follow-up on DC/TOC: Medication compliance Diet/ fluid restrictions Daily weights    Earnestine Leys, BSN, RN Heart Failure Transport planner Only

## 2021-09-03 NOTE — Discharge Summary (Signed)
Physician Discharge Summary   Patient: Roy [name] MRN: [id number] DOB: 1935-03-26  Admit date:     08/30/2021  Discharge date: 09/03/21  Discharge Physician: Roy Poche, MD   PCP: Roy Blocker, MD   Recommendations at discharge:  Outpatient follow-up with PCP and cardiologist Continue palliative care services  Discharge Diagnoses: Principal Problem:   CHF exacerbation ([place]) Active Problems:   DM2 (diabetes mellitus, type 2) ([place])   OSA on CPAP   Uncontrolled hypertension   Persistent atrial fibrillation (HCC)   Elevated troponin  Resolved Problems:   * No resolved hospital problems. *  Hospital Course: Roy [name] is a 86 y.o. male with a history of atrial fibrillation on Eliquis, chronic diastolic heart failure, DVT, asthma/COPD, diet controlled diabetes mellitus type 2, pulmonary hypertension, GERD, hypertension, BPH. Patient presented secondary to leg swelling with evidence of acute on chronic heart failure. Patient diuresed with IV lasix which was stopped secondary to uptrending creatinine.  Acute combined systolic and diastolic heart failure Chronic diastolic heart failure Mild symptoms per chart history. No hypoxia. No chest pain or dyspnea currently. Given a dose of Lasix on admission. No admission weight obtained. Transthoracic Echocardiogram significant for reduced LVEF of 45-50% and global hypokinesis. Weight of 87.4 kg (192.68 lbs) on admission. Lasix IV continues until persistent uptrend of creatinine. Patient discharged on prior torsemide dose on day of discharge. UOP of 1,250 mL with two unmeasured occurrence in the last 24 hours. Weight of 86 kg (189.5 lbs) on day of discharge.   Uncontrolled hypertension Primary hypertension Continue diltiazem, labetalol and Imdur   Elevated troponin Mild elevation up to 103 and flat. No chest pain. In setting of diastolic heart failure. Likely demand ischemia.   Persistent atrial fibrillation Continue Eliquis,  labetalol and diltiazem   Asthma COPD Continue Trelegy   Diabetes mellitus, type 2 Patient is on diet control as an outpatient. Hemoglobin A1C of 5.5% from 11/22. Continue carb modified diet   BPH Continue finasteride and tamsulosin.   CKD stage IV Stable. Baseline creatinine is about 3.3.    OSA Noted.   Hypercalcemia Likely secondary hypercalcemia in setting of CKD. Patient is on Sensipar as an outpatient. Continue Sensipar.   QT prolongation Mildly elevated. Avoid QT prolonging medications as able.   Consultants: None Procedures performed: Transthoracic Echocardiogram  Disposition: Home health Diet recommendation: Cardiac/carb modified diet  DISCHARGE MEDICATION: Allergies as of 09/03/2021   No Known Allergies      Medication List     STOP taking these medications    methenamine 0.5 GM tablet Commonly known as: MANDELAMINE   zaleplon 5 MG capsule Commonly known as: Sonata       TAKE these medications    cinacalcet 30 MG tablet Commonly known as: SENSIPAR Take 30 mg by mouth daily.   Contour Next Test test strip Generic drug: glucose blood daily.   diltiazem 240 MG 24 hr capsule Commonly known as: CARDIZEM CD Take 240 mg by mouth daily.   Eliquis 2.5 MG Tabs tablet Generic drug: apixaban Take 2.5 mg by mouth 2 (two) times daily.   finasteride 5 MG tablet Commonly known as: PROSCAR Take 5 mg by mouth daily.   gabapentin 100 MG capsule Commonly known as: NEURONTIN Take 100 mg by mouth daily.   isosorbide dinitrate 30 MG tablet Commonly known as: ISORDIL TAKE 1 TABLET BY MOUTH THREE TIMES DAILY   labetalol 100 MG tablet Commonly known as: NORMODYNE Take 1 tablet (100 mg total) by  mouth 2 (two) times daily.   Microlet Lancets Misc   pantoprazole 40 MG tablet Commonly known as: PROTONIX Take 40 mg by mouth daily.   potassium chloride SA 20 MEQ tablet Commonly known as: KLOR-CON M Take 20 mEq by mouth daily.   tamsulosin 0.4 MG  Caps capsule Commonly known as: FLOMAX Take 1 capsule (0.4 mg total) by mouth daily.   torsemide 100 MG tablet Commonly known as: DEMADEX Take 0.5 tablets (50 mg total) by mouth daily.   Trelegy Ellipta 100-62.5-25 MCG/ACT Aepb Generic drug: Fluticasone-Umeclidin-Vilant Inhale 1 puff into the lungs daily.        Follow-up Information     [facility]. Go in 13 day(s).   Specialty: Cardiology Why: Hospital follow up PLEASE bring current medication list to appointment FREE valet parking, Entrance C, off of [employer] information: [address] [id number] [place], [facility] Follow up.   Specialty: [place] Why: HHRN,HHPT,HHOT, [facility] will contact you to set up apt times. Contact information: [place] [address] [phone]         [facility] Follow up.   Why: resume Contact information: [place]        Roy Blocker, MD. Schedule an appointment as soon as possible for a visit in 1 week(s).   Specialty: Internal Medicine Why: For hospital follow-up Contact information: [place] [address] [phone]                Discharge Exam: BP 126/67 (BP Location: Left Arm)   Pulse 60   Temp (!) 97.3 F (36.3 C) (Oral)   Resp 14   Ht '5\' 10"'$  (1.778 m)   Wt 86 kg   SpO2 96%   BMI 27.19 kg/m   General exam: Appears calm and comfortable Respiratory system: Clear to auscultation. Respiratory effort normal. Cardiovascular system: S1 & S2 heard, Normal rate with regular rhythm. Gastrointestinal system: Abdomen is nondistended, soft and nontender. Normal bowel sounds heard. Central nervous system: Alert and oriented. No focal neurological deficits. Musculoskeletal: Significant  BLE edema up to thighs. No calf tenderness Skin: No cyanosis. No rashes Psychiatry: Judgement and insight appear normal. Mood & affect appropriate.   Condition at discharge: stable  The results of significant diagnostics from this hospitalization (including imaging, microbiology, ancillary and laboratory) are listed below for reference.   Imaging Studies: ECHOCARDIOGRAM COMPLETE  Result Date: 08/31/2021    ECHOCARDIOGRAM REPORT   Patient Name:   Roy [name] Date of Exam: 08/31/2021 Medical Rec #:  [id number]         Height:       70.0 in Accession #:    [id number]        Weight:       202.6 lb Date of Birth:  04-28-1935         BSA:          2.099 m Patient Age:    40 years          BP:           121/84 mmHg Patient Gender: M                 HR:           72 bpm. Exam Location:  Inpatient Procedure: 2D Echo, Cardiac Doppler and Color Doppler Indications:    Congestive heart failure  History:        Patient has prior history of Echocardiogram examinations, most                 recent 02/21/2021. COPD and Pulmonary HTN, Arrythmias:Atrial                 Fibrillation; Risk Factors:Diabetes and Hypertension.  Sonographer:    Joette Catching RCS Referring Phys: [id number] [place]  1. Left ventricular ejection fraction, by estimation, is 45 to 50%. The left ventricle has mildly decreased function. The left ventricle demonstrates global hypokinesis. There is mild left ventricular hypertrophy. Left ventricular diastolic parameters are indeterminate.  2. Right ventricular systolic function is mildly reduced. The right ventricular size is normal. There is severely elevated pulmonary artery systolic pressure. The estimated right ventricular systolic pressure is 31.4 mmHg.  3. Left atrial size was severely dilated.  4. Right atrial size was moderately dilated.  5. The mitral valve is normal in structure. Mild to moderate mitral valve regurgitation. No evidence of mitral stenosis.  6. The aortic  valve is tricuspid. There is mild calcification of the aortic valve. Aortic valve regurgitation is not visualized. No aortic stenosis is present.  7. The inferior vena cava is dilated in size with <50% respiratory variability, suggesting right atrial pressure of 15 mmHg.  8. The patient is in atrial fibrillation. FINDINGS  Left Ventricle: Left ventricular ejection fraction, by estimation, is 45 to 50%. The left ventricle has mildly decreased function. The left ventricle demonstrates global hypokinesis. The left ventricular internal cavity size was normal in size. There is  mild left ventricular hypertrophy. Left ventricular diastolic parameters are indeterminate. Right Ventricle: The right ventricular size is normal. No increase in right ventricular wall thickness. Right ventricular systolic function is mildly reduced. There is severely elevated pulmonary artery systolic pressure. The tricuspid regurgitant velocity is 4.15 m/s, and with an assumed right atrial pressure of 15 mmHg, the estimated right ventricular systolic pressure is 97.0 mmHg. Left Atrium: Left atrial size was severely dilated. Right Atrium: Right atrial size was moderately dilated. Pericardium: There is no evidence of pericardial effusion. Mitral Valve: The mitral valve is normal in structure. Mild mitral annular calcification. Mild to moderate mitral valve regurgitation. No evidence of mitral valve stenosis. Tricuspid Valve: The tricuspid valve is normal in structure. Tricuspid valve regurgitation is mild. Aortic Valve: The aortic valve is tricuspid. There is mild calcification of the aortic valve. Aortic valve regurgitation is not visualized. No aortic stenosis is present. Aortic valve mean gradient measures 3.0 mmHg. Aortic valve peak gradient measures 5.6 mmHg. Aortic valve area, by VTI measures 1.29 cm. Pulmonic Valve: The pulmonic valve was normal in structure. Pulmonic valve regurgitation is trivial. Aorta: The aortic root is normal in size  and structure. Venous: The inferior vena cava is dilated in size with less than 50% respiratory variability, suggesting right atrial pressure of 15 mmHg. IAS/Shunts: No atrial level shunt detected by color flow Doppler.  LEFT VENTRICLE PLAX 2D LVIDd:         3.80 cm     Diastology LVIDs:         3.00 cm     LV e' medial:    7.61 cm/s LV PW:         1.20 cm     LV E/e' medial:  13.9 LV IVS:  1.30 cm     LV e' lateral:   9.57 cm/s LVOT diam:     1.80 cm     LV E/e' lateral: 11.1 LV SV:         32 LV SV Index:   15 LVOT Area:     2.54 cm  LV Volumes (MOD) LV vol d, MOD A2C: 94.9 ml LV vol d, MOD A4C: 83.7 ml LV vol s, MOD A2C: 48.9 ml LV vol s, MOD A4C: 45.4 ml LV SV MOD A2C:     46.0 ml LV SV MOD A4C:     83.7 ml LV SV MOD BP:      41.5 ml RIGHT VENTRICLE             IVC RV Basal diam:  3.80 cm     IVC diam: 2.30 cm RV Mid diam:    2.40 cm RV S prime:     15.30 cm/s TAPSE (M-mode): 1.8 cm LEFT ATRIUM              Index        RIGHT ATRIUM           Index LA diam:        5.00 cm  2.38 cm/m   RA Area:     26.50 cm LA Vol (A2C):   149.0 ml 70.99 ml/m  RA Volume:   93.10 ml  44.36 ml/m LA Vol (A4C):   139.0 ml 66.23 ml/m LA Biplane Vol: 151.0 ml 71.95 ml/m  AORTIC VALVE                    PULMONIC VALVE AV Area (Vmax):    1.55 cm     PV Vmax:          0.90 m/s AV Area (Vmean):   1.41 cm     PV Peak grad:     3.2 mmHg AV Area (VTI):     1.29 cm     PR End Diast Vel: 5.43 msec AV Vmax:           118.00 cm/s AV Vmean:          84.400 cm/s AV VTI:            0.248 m AV Peak Grad:      5.6 mmHg AV Mean Grad:      3.0 mmHg LVOT Vmax:         71.70 cm/s LVOT Vmean:        46.600 cm/s LVOT VTI:          0.126 m LVOT/AV VTI ratio: 0.51  AORTA Ao Root diam: 3.10 cm Ao Asc diam:  3.10 cm MITRAL VALVE                    TRICUSPID VALVE MV Area (PHT): 7.99 cm         TR Peak grad:   68.9 mmHg MV Decel Time: 95 msec          TR Vmax:        415.00 cm/s MR Peak grad:      136.4 mmHg MR Mean grad:      91.0 mmHg     SHUNTS MR Vmax:           584.00 cm/s  Systemic VTI:  0.13 m MR Vmean:          454.0 cm/s   Systemic Diam: 1.80 cm MR Vena Contracta:  0.30 cm MR PISA:           1.01 cm MR PISA Eff ROA:   6 mm MR PISA Radius:    0.40 cm MV E velocity: 106.00 cm/s MV A velocity: 50.30 cm/s MV E/A ratio:  2.11 Dalton McleanMD Electronically signed by Franki Monte Signature Date/Time: 08/31/2021/3:53:57 PM    Final    DG Chest 2 View  Result Date: 08/30/2021 CLINICAL DATA:  Leg swelling.  Shortness of breath EXAM: CHEST - 2 VIEW COMPARISON:  10/07/2015 FINDINGS: reverse apical lordotic frontal positioning. Midline trachea. Normal heart size. No pleural effusion or pneumothorax. Pulmonary interstitial prominence is favored to be due to AP portable technique and low lung volumes. No overt congestive failure. There may be mild subsegmental atelectasis at the lung bases. IMPRESSION: Low lung volumes, without convincing evidence of acute cardiopulmonary disease. Limited by reverse apical lordotic positioning AP portable technique. Electronically Signed   By: Abigail Miyamoto M.D.   On: 08/30/2021 19:37    Microbiology: Results for orders placed or performed during the hospital encounter of 02/20/21  Resp Panel by RT-PCR (Flu A&B, Covid) Nasopharyngeal Swab     Status: None   Collection Time: 02/20/21  3:14 PM   Specimen: Nasopharyngeal Swab; Nasopharyngeal(NP) swabs in vial transport medium  Result Value Ref Range Status   SARS Coronavirus 2 by RT PCR NEGATIVE NEGATIVE Final    Comment: (NOTE) SARS-CoV-2 target nucleic acids are NOT DETECTED.  The SARS-CoV-2 RNA is generally detectable in upper respiratory specimens during the acute phase of infection. The lowest concentration of SARS-CoV-2 viral copies this assay can detect is 138 copies/mL. A negative result does not preclude SARS-Cov-2 infection and should not be used as the sole basis for treatment or other patient management decisions. A negative result may occur  with  improper specimen collection/handling, submission of specimen other than nasopharyngeal swab, presence of viral mutation(s) within the areas targeted by this assay, and inadequate number of viral copies(<138 copies/mL). A negative result must be combined with clinical observations, patient history, and epidemiological information. The expected result is Negative.  Fact Sheet for Patients:  EntrepreneurPulse.com.au  Fact Sheet for Healthcare Providers:  IncredibleEmployment.be  This test is no t yet approved or cleared by the [place] FDA and  has been authorized for detection and/or diagnosis of SARS-CoV-2 by FDA under an Emergency Use Authorization (EUA). This EUA will remain  in effect (meaning this test can be used) for the duration of the COVID-19 declaration under Section 564(b)(1) of the Act, 21 U.S.C.section 360bbb-3(b)(1), unless the authorization is terminated  or revoked sooner.       Influenza A by PCR NEGATIVE NEGATIVE Final   Influenza B by PCR NEGATIVE NEGATIVE Final    Comment: (NOTE) The Xpert Xpress SARS-CoV-2/FLU/RSV plus assay is intended as an aid in the diagnosis of influenza from Nasopharyngeal swab specimens and should not be used as a sole basis for treatment. Nasal washings and aspirates are unacceptable for Xpert Xpress SARS-CoV-2/FLU/RSV testing.  Fact Sheet for Patients: EntrepreneurPulse.com.au  Fact Sheet for Healthcare Providers: IncredibleEmployment.be  This test is not yet approved or cleared by the [place] FDA and has been authorized for detection and/or diagnosis of SARS-CoV-2 by FDA under an Emergency Use Authorization (EUA). This EUA will remain in effect (meaning this test can be used) for the duration of the COVID-19 declaration under Section 564(b)(1) of the Act, 21 U.S.C. section 360bbb-3(b)(1), unless the authorization is terminated  or revoked.  Performed  at [facility], [place] [address] [place], [place] [address]   Urine Culture     Status: Abnormal   Collection Time: 02/22/21 10:41 AM   Specimen: Urine, Clean Catch  Result Value Ref Range Status   Specimen Description URINE, CLEAN CATCH  Final   Special Requests   Final    NONE Performed at [facility], [place] [address] [place], [place] [address]    Culture >=100,000 COLONIES/mL STAPHYLOCOCCUS EPIDERMIDIS (A)  Final   Report Status 02/25/2021 FINAL  Final   Organism ID, Bacteria STAPHYLOCOCCUS EPIDERMIDIS (A)  Final      Susceptibility   Staphylococcus epidermidis - MIC*    CIPROFLOXACIN >=8 RESISTANT Resistant     GENTAMICIN <=0.5 SENSITIVE Sensitive     NITROFURANTOIN <=16 SENSITIVE Sensitive     OXACILLIN 0.5 RESISTANT Resistant     TETRACYCLINE 2 SENSITIVE Sensitive     VANCOMYCIN 1 SENSITIVE Sensitive     TRIMETH/SULFA 80 RESISTANT Resistant     CLINDAMYCIN <=0.25 SENSITIVE Sensitive     RIFAMPIN <=0.5 SENSITIVE Sensitive     Inducible Clindamycin NEGATIVE Sensitive     * >=100,000 COLONIES/mL STAPHYLOCOCCUS EPIDERMIDIS  Resp Panel by RT-PCR (Flu A&B, Covid) Nasopharyngeal Swab     Status: None   Collection Time: 02/24/21 11:30 AM   Specimen: Nasopharyngeal Swab; Nasopharyngeal(NP) swabs in vial transport medium  Result Value Ref Range Status   SARS Coronavirus 2 by RT PCR NEGATIVE NEGATIVE Final    Comment: (NOTE) SARS-CoV-2 target nucleic acids are NOT DETECTED.  The SARS-CoV-2 RNA is generally detectable in upper respiratory specimens during the acute phase of infection. The lowest concentration of SARS-CoV-2 viral copies this assay can detect is 138 copies/mL. A negative result does not preclude SARS-Cov-2 infection and should not be used as the sole basis for treatment or other patient management decisions. A negative result may occur with  improper specimen collection/handling, submission of specimen other than  nasopharyngeal swab, presence of viral mutation(s) within the areas targeted by this assay, and inadequate number of viral copies(<138 copies/mL). A negative result must be combined with clinical observations, patient history, and epidemiological information. The expected result is Negative.  Fact Sheet for Patients:  EntrepreneurPulse.com.au  Fact Sheet for Healthcare Providers:  IncredibleEmployment.be  This test is no t yet approved or cleared by the [place] FDA and  has been authorized for detection and/or diagnosis of SARS-CoV-2 by FDA under an Emergency Use Authorization (EUA). This EUA will remain  in effect (meaning this test can be used) for the duration of the COVID-19 declaration under Section 564(b)(1) of the Act, 21 U.S.C.section 360bbb-3(b)(1), unless the authorization is terminated  or revoked sooner.       Influenza A by PCR NEGATIVE NEGATIVE Final   Influenza B by PCR NEGATIVE NEGATIVE Final    Comment: (NOTE) The Xpert Xpress SARS-CoV-2/FLU/RSV plus assay is intended as an aid in the diagnosis of influenza from Nasopharyngeal swab specimens and should not be used as a sole basis for treatment. Nasal washings and aspirates are unacceptable for Xpert Xpress SARS-CoV-2/FLU/RSV testing.  Fact Sheet for Patients: EntrepreneurPulse.com.au  Fact Sheet for Healthcare Providers: IncredibleEmployment.be  This test is not yet approved or cleared by the [place] FDA and has been authorized for detection and/or diagnosis of SARS-CoV-2 by FDA under an Emergency Use Authorization (EUA). This EUA will remain in effect (meaning this test can be used) for the duration of the COVID-19 declaration under Section 564(b)(1) of  the Act, 21 U.S.C. section 360bbb-3(b)(1), unless the authorization is terminated or revoked.  Performed at [facility], [place] [address], [place], [place] [address]      Labs: CBC: Recent Labs  Lab 08/30/21 1907  WBC 7.5  NEUTROABS 6.6  HGB 11.4*  HCT 35.8*  MCV 99.4  PLT 572   Basic Metabolic Panel: Recent Labs  Lab 08/30/21 1907 08/31/21 0404 09/01/21 0215 09/02/21 0330 09/03/21 0242  NA 141 140 141 141 142  K 4.1 4.1 4.6 3.9 3.9  CL 110 112* 112* 111 110  CO2 20* 18* 22 21* 23  GLUCOSE 80 78 99 141* 150*  BUN 70* 70* 74* 74* 71*  CREATININE 3.15* 3.08* 3.12* 3.35* 3.50*  CALCIUM 11.2* 11.2* 10.6* 10.3 9.9  MG  --  2.2  --   --   --    Discharge time spent: 35 minutes.  Signed: Cordelia Poche, MD [facility] 09/03/2021

## 2021-09-03 NOTE — Progress Notes (Signed)
Heart Failure Stewardship Pharmacist Progress Note   PCP: Rogers Blocker, MD PCP-Cardiologist: None    HPI:  86 yo M with PMH of HF, afb, DVT, asthma/COPD, T2DM, CKD IV, OSA on CPAP, pulmonary HTN, GERD, and HTN. He presented to the ED on 6/8 with LEE and shortness of breath. Notably noncompliant with fluid restrictions and taking medications regularly. CXR without evidence of acute cardiopulmonary disease. ECHO 6/9 with LVEF 45-50%, mildly reduced RV, and mild-moderate MR.  Current HF Medications: Diuretic: furosemide 40 mg IV daily Other: Isoordil 30 mg TID  Prior to admission HF Medications: Diuretic: torsemide 50 mg daily Other: Isordil 30 mg TID  Pertinent Lab Values: Serum creatinine 3.50, BUN 71, Potassium 3.9, Sodium 142, BNP 1072.4, Magnesium 2.2, A1c 5.5 (02/20/21)   Vital Signs: Weight: 189 lbs (admission weight: 192 lbs) Blood pressure: 140/70s  Heart rate: 60s - aflutter  I/O: -1.5L yesterday; net -4.8L   Medication Assistance / Insurance Benefits Check: Does the patient have prescription insurance?  Yes Type of insurance plan: [place]:  Prior to admission outpatient pharmacy: [facility] Is the patient willing to use [facility] pharmacy at discharge? Yes Is the patient willing to transition their outpatient pharmacy to utilize a [facility] outpatient pharmacy?   Pending    Assessment: 1. Acute on chronic diastolic CHF (LVEF 06-30%). NYHA class III symptoms. - Continue furosemide 40 mg IV daily  - No ACE/ARB/ARNI, MRA, or SGLT2i with advanced CKD - Continue Isordil 30 mg TID - Consider adding hydralazine 25 mg TID for additional BP reduction   Plan: 1) Medication changes recommended at this time: - Add hydralazine 25 mg TID  2) Patient assistance: - None pending  3)  Education  - To be completed prior to discharge  Kerby Nora, PharmD, BCPS Heart Failure Cytogeneticist Phone [phone]

## 2021-09-03 NOTE — Care Management Important Message (Signed)
Important Message  Patient Details  Name: Roy [name] MRN: [id number] Date of Birth: 1935/08/14   Medicare Important Message Given:  Yes     Shelda Altes 09/03/2021, 7:45 AM

## 2021-09-03 NOTE — Discharge Instructions (Signed)
Roy [name],  You were in the hospital with increased leg swelling, concerning for heart failure. You were managed with IV Lasix with some improvement. IV lasix had to be stopped because your kidney function was trending too high. Please resume your home medications and follow-up with your PCP and cardiologist.

## 2021-09-03 NOTE — Progress Notes (Signed)
Went over discharge paper work with Son. All questions answered. PIV removed. All belongings at beside.

## 2021-09-04 ENCOUNTER — Telehealth: Payer: Self-pay

## 2021-09-04 NOTE — Telephone Encounter (Signed)
Attempted to contact patient and patient's son [name] to schedule a Palliative Care follow up appointment. No answer left a message for patient to return call. [name]'s number is invalid.

## 2021-09-05 ENCOUNTER — Inpatient Hospital Stay (HOSPITAL_COMMUNITY): Admission: RE | Admit: 2021-09-05 | Payer: Medicare Other | Source: Ambulatory Visit

## 2021-09-11 ENCOUNTER — Other Ambulatory Visit: Payer: Self-pay | Admitting: Cardiology

## 2021-09-11 DIAGNOSIS — I10 Essential (primary) hypertension: Secondary | ICD-10-CM

## 2021-09-12 ENCOUNTER — Inpatient Hospital Stay (HOSPITAL_COMMUNITY): Admission: RE | Admit: 2021-09-12 | Payer: Medicare Other | Source: Ambulatory Visit

## 2021-09-14 ENCOUNTER — Telehealth (HOSPITAL_COMMUNITY): Payer: Self-pay | Admitting: *Deleted

## 2021-09-14 ENCOUNTER — Encounter (HOSPITAL_COMMUNITY): Payer: Medicare Other

## 2021-09-14 NOTE — Telephone Encounter (Signed)
Heart Failure Nurse Navigator Progress Note   Spoke with son, Ules Benson, need to cancel appointment, will call this afternoon and reschedule.   Rhae Hammock, BSN, Scientist, clinical (histocompatibility and immunogenetics) Only

## 2021-09-18 ENCOUNTER — Inpatient Hospital Stay (HOSPITAL_COMMUNITY)
Admission: EM | Admit: 2021-09-18 | Discharge: 2021-09-24 | DRG: 291 | Disposition: A | Discharge status: Skilled Nursing Facility | Payer: Medicare Other | Source: Ambulatory Visit | Attending: Internal Medicine | Admitting: Internal Medicine

## 2021-09-18 ENCOUNTER — Emergency Department (HOSPITAL_COMMUNITY): Payer: Medicare Other

## 2021-09-18 ENCOUNTER — Other Ambulatory Visit: Payer: Self-pay

## 2021-09-18 DIAGNOSIS — N179 Acute kidney failure, unspecified: Secondary | ICD-10-CM | POA: Diagnosis present

## 2021-09-18 DIAGNOSIS — G4733 Obstructive sleep apnea (adult) (pediatric): Secondary | ICD-10-CM

## 2021-09-18 DIAGNOSIS — I10 Essential (primary) hypertension: Secondary | ICD-10-CM | POA: Diagnosis present

## 2021-09-18 DIAGNOSIS — J4489 Other specified chronic obstructive pulmonary disease: Secondary | ICD-10-CM | POA: Diagnosis present

## 2021-09-18 DIAGNOSIS — E1122 Type 2 diabetes mellitus with diabetic chronic kidney disease: Secondary | ICD-10-CM | POA: Diagnosis present

## 2021-09-18 DIAGNOSIS — J449 Chronic obstructive pulmonary disease, unspecified: Secondary | ICD-10-CM | POA: Diagnosis present

## 2021-09-18 DIAGNOSIS — Z8546 Personal history of malignant neoplasm of prostate: Secondary | ICD-10-CM | POA: Diagnosis not present

## 2021-09-18 DIAGNOSIS — Z86718 Personal history of other venous thrombosis and embolism: Secondary | ICD-10-CM | POA: Diagnosis not present

## 2021-09-18 DIAGNOSIS — K219 Gastro-esophageal reflux disease without esophagitis: Secondary | ICD-10-CM | POA: Diagnosis present

## 2021-09-18 DIAGNOSIS — G3184 Mild cognitive impairment, so stated: Secondary | ICD-10-CM | POA: Diagnosis present

## 2021-09-18 DIAGNOSIS — N184 Chronic kidney disease, stage 4 (severe): Secondary | ICD-10-CM | POA: Diagnosis present

## 2021-09-18 DIAGNOSIS — T45516A Underdosing of anticoagulants, initial encounter: Secondary | ICD-10-CM | POA: Diagnosis present

## 2021-09-18 DIAGNOSIS — Z79899 Other long term (current) drug therapy: Secondary | ICD-10-CM | POA: Diagnosis not present

## 2021-09-18 DIAGNOSIS — R778 Other specified abnormalities of plasma proteins: Secondary | ICD-10-CM | POA: Diagnosis present

## 2021-09-18 DIAGNOSIS — R7989 Other specified abnormal findings of blood chemistry: Secondary | ICD-10-CM | POA: Diagnosis present

## 2021-09-18 DIAGNOSIS — Z87440 Personal history of urinary (tract) infections: Secondary | ICD-10-CM | POA: Diagnosis not present

## 2021-09-18 DIAGNOSIS — I2729 Other secondary pulmonary hypertension: Secondary | ICD-10-CM

## 2021-09-18 DIAGNOSIS — Z9989 Dependence on other enabling machines and devices: Secondary | ICD-10-CM

## 2021-09-18 DIAGNOSIS — Z87891 Personal history of nicotine dependence: Secondary | ICD-10-CM | POA: Diagnosis not present

## 2021-09-18 DIAGNOSIS — I4819 Other persistent atrial fibrillation: Secondary | ICD-10-CM | POA: Diagnosis present

## 2021-09-18 DIAGNOSIS — R5381 Other malaise: Secondary | ICD-10-CM | POA: Diagnosis not present

## 2021-09-18 DIAGNOSIS — D696 Thrombocytopenia, unspecified: Secondary | ICD-10-CM | POA: Diagnosis present

## 2021-09-18 DIAGNOSIS — Z7951 Long term (current) use of inhaled steroids: Secondary | ICD-10-CM | POA: Diagnosis not present

## 2021-09-18 DIAGNOSIS — D649 Anemia, unspecified: Secondary | ICD-10-CM | POA: Diagnosis present

## 2021-09-18 DIAGNOSIS — Z7189 Other specified counseling: Secondary | ICD-10-CM | POA: Diagnosis not present

## 2021-09-18 DIAGNOSIS — Z833 Family history of diabetes mellitus: Secondary | ICD-10-CM

## 2021-09-18 DIAGNOSIS — E119 Type 2 diabetes mellitus without complications: Secondary | ICD-10-CM

## 2021-09-18 DIAGNOSIS — I509 Heart failure, unspecified: Secondary | ICD-10-CM | POA: Diagnosis not present

## 2021-09-18 DIAGNOSIS — R6889 Other general symptoms and signs: Secondary | ICD-10-CM | POA: Diagnosis not present

## 2021-09-18 DIAGNOSIS — Z515 Encounter for palliative care: Secondary | ICD-10-CM | POA: Diagnosis not present

## 2021-09-18 DIAGNOSIS — Z91130 Patient's unintentional underdosing of medication regimen due to age-related debility: Secondary | ICD-10-CM

## 2021-09-18 DIAGNOSIS — I130 Hypertensive heart and chronic kidney disease with heart failure and stage 1 through stage 4 chronic kidney disease, or unspecified chronic kidney disease: Principal | ICD-10-CM | POA: Diagnosis present

## 2021-09-18 DIAGNOSIS — I5033 Acute on chronic diastolic (congestive) heart failure: Secondary | ICD-10-CM | POA: Diagnosis present

## 2021-09-18 DIAGNOSIS — Z789 Other specified health status: Secondary | ICD-10-CM | POA: Diagnosis not present

## 2021-09-18 LAB — COMPREHENSIVE METABOLIC PANEL
ALT: 26 U/L (ref 0–44)
AST: 20 U/L (ref 15–41)
Albumin: 3 g/dL — ABNORMAL LOW (ref 3.5–5.0)
Alkaline Phosphatase: 110 U/L (ref 38–126)
Anion gap: 10 (ref 5–15)
BUN: 63 mg/dL — ABNORMAL HIGH (ref 8–23)
CO2: 22 mmol/L (ref 22–32)
Calcium: 8.7 mg/dL — ABNORMAL LOW (ref 8.9–10.3)
Chloride: 107 mmol/L (ref 98–111)
Creatinine, Ser: 3.04 mg/dL — ABNORMAL HIGH (ref 0.61–1.24)
GFR, Estimated: 20 mL/min — ABNORMAL LOW (ref >60)
Glucose, Bld: 93 mg/dL (ref 70–99)
Potassium: 4.1 mmol/L (ref 3.5–5.1)
Sodium: 139 mmol/L (ref 135–145)
Total Bilirubin: 1.1 mg/dL (ref 0.3–1.2)
Total Protein: 5.7 g/dL — ABNORMAL LOW (ref 6.5–8.1)

## 2021-09-18 LAB — CBC WITH DIFFERENTIAL/PLATELET
Abs Immature Granulocytes: 0.02 10*3/uL (ref 0.00–0.07)
Basophils Absolute: 0 10*3/uL (ref 0.0–0.1)
Basophils Relative: 1 %
Eosinophils Absolute: 0.1 10*3/uL (ref 0.0–0.5)
Eosinophils Relative: 2 %
HCT: 31 % — ABNORMAL LOW (ref 39.0–52.0)
Hemoglobin: 10.1 g/dL — ABNORMAL LOW (ref 13.0–17.0)
Immature Granulocytes: 1 %
Lymphocytes Relative: 18 %
Lymphs Abs: 0.8 10*3/uL (ref 0.7–4.0)
MCH: 32.1 pg (ref 26.0–34.0)
MCHC: 32.6 g/dL (ref 30.0–36.0)
MCV: 98.4 fL (ref 80.0–100.0)
Monocytes Absolute: 0.3 10*3/uL (ref 0.1–1.0)
Monocytes Relative: 6 %
Neutro Abs: 3.3 10*3/uL (ref 1.7–7.7)
Neutrophils Relative %: 72 %
Platelets: 113 10*3/uL — ABNORMAL LOW (ref 150–400)
RBC: 3.15 MIL/uL — ABNORMAL LOW (ref 4.22–5.81)
RDW: 14.6 % (ref 11.5–15.5)
WBC: 4.4 10*3/uL (ref 4.0–10.5)
nRBC: 0 % (ref 0.0–0.2)

## 2021-09-18 LAB — TROPONIN I (HIGH SENSITIVITY)
Troponin I (High Sensitivity): 70 ng/L — ABNORMAL HIGH (ref <18)
Troponin I (High Sensitivity): 67 ng/L — ABNORMAL HIGH (ref <18)

## 2021-09-18 LAB — D-DIMER, QUANTITATIVE: D-Dimer, Quant: 2.27 ug/mL-FEU — ABNORMAL HIGH (ref 0.00–0.50)

## 2021-09-18 LAB — LACTIC ACID, PLASMA
Lactic Acid, Venous: 1.2 mmol/L (ref 0.5–1.9)
Lactic Acid, Venous: 1.3 mmol/L (ref 0.5–1.9)

## 2021-09-18 MED ORDER — FUROSEMIDE 10 MG/ML IJ SOLN
40.0000 mg | Freq: Once | INTRAMUSCULAR | Status: AC
  Administered 2021-09-18: 40 mg via INTRAVENOUS
  Filled 2021-09-18: qty 4

## 2021-09-18 MED ORDER — FUROSEMIDE 10 MG/ML IJ SOLN: 60.0000 mg | Freq: Once | INTRAMUSCULAR | Status: DC

## 2021-09-18 NOTE — ED Triage Notes (Signed)
Pt bib [facility] from PCP office for follow up. PCP feels like pt is more weak and confused compared to baseline. EMS was called out d/t pt being hypotensive and bradycardic. 96 systolic BP per PCP. No medication changes. Pt PCP was concerned for infection. Pt has hx CHF, UTI's, and CKD.   EMS vitals 116/62 BP 42-60s HR a-flutter 20 RR 250 cc NS

## 2021-09-18 NOTE — ED Provider Notes (Signed)
[facility] EMERGENCY DEPARTMENT Provider Note   CSN: [id number] Arrival date & time: 09/18/21  1709     History  Chief Complaint  Patient presents with   Hypotension    Roy [name] is a 86 y.o. male.  HPI  86 year old male with medical history significant for DM 2, HTN, GERD, COPD, DVT, atrial fibrillation on Eliquis, OSA on CPAP, CKD, CHF (last EF September 12 2343 to 50%), sepsis secondary to enterococcal UTI, who presents to the emergency department with hypotension and bradycardia at his PCP office.  The patient is unsure if he is taking his torsemide.  He has reportedly been confused over the past few days. The patient denies any chest pain or shortness of breath. He states that he has had worsening bilateral lower extremity swelling over the past few days. The patient reportedly had SBP of 96 with his PCP. PCP was concerned with a potential infectious etiology. Pt has a hx of UTIs in the past. Denies urinary symptoms. EMS vitals 116/62, HR 42-60s in atrial flutter, RR 20. He received a 250cc NS bolus with EMS.   Home Medications Prior to Admission medications   Medication Sig Start Date End Date Taking? Authorizing Provider  cinacalcet (SENSIPAR) 30 MG tablet Take 30 mg by mouth daily with supper. 06/06/19  Yes [provider]  diltiazem (CARDIZEM CD) 240 MG 24 hr capsule Take 240 mg by mouth daily.   Yes [provider]  ELIQUIS 2.5 MG TABS tablet Take 2.5 mg by mouth 2 (two) times daily. 04/14/20  Yes [provider]  finasteride (PROSCAR) 5 MG tablet Take 5 mg by mouth daily.   Yes [provider]  gabapentin (NEURONTIN) 100 MG capsule Take 100 mg by mouth at bedtime.   Yes [provider]  isosorbide dinitrate (ISORDIL) 30 MG tablet TAKE 1 TABLET BY MOUTH THREE TIMES DAILY Patient taking differently: Take 30 mg by mouth 3 (three) times daily. 09/11/21  Yes Patwardhan, Manish J, MD  labetalol (NORMODYNE) 100 MG  tablet Take 1 tablet (100 mg total) by mouth 2 (two) times daily. 06/21/20  Yes Patwardhan, Manish J, MD  pantoprazole (PROTONIX) 40 MG tablet Take 40 mg by mouth daily.   Yes [provider]  potassium chloride SA (KLOR-CON M) 20 MEQ tablet Take 20 mEq by mouth daily.   Yes [provider]  tamsulosin (FLOMAX) 0.4 MG CAPS capsule Take 1 capsule (0.4 mg total) by mouth daily. Patient taking differently: Take 0.4 mg by mouth in the morning and at bedtime. 10/18/15  Yes Dana Allan I, MD  torsemide (DEMADEX) 100 MG tablet Take 0.5 tablets (50 mg total) by mouth daily. 02/24/21  Yes Regalado, Cassie Freer, MD  CONTOUR NEXT TEST test strip daily. 12/10/19   [provider]  Fluticasone-Umeclidin-Vilant (TRELEGY ELLIPTA) 100-62.5-25 MCG/INH AEPB Inhale 1 puff into the lungs daily. Patient not taking: Reported on 09/19/2021 02/29/20   Deneise Lever, MD  Microlet Lancets MISC  12/10/19   [provider]      Allergies    Patient has no known allergies.    Review of Systems   Review of Systems  All other systems reviewed and are negative.   Physical Exam Updated Vital Signs BP 133/69   Pulse 63   Temp 98 F (36.7 C)   Resp 14   SpO2 99%  Physical Exam Vitals and nursing note reviewed.  Constitutional:      General: He is not in  acute distress.    Appearance: He is well-developed. He is obese. He is not ill-appearing.  HENT:     Head: Normocephalic and atraumatic.  Eyes:     Conjunctiva/sclera: Conjunctivae normal.  Cardiovascular:     Rate and Rhythm: Normal rate and regular rhythm.  Pulmonary:     Effort: Pulmonary effort is normal. No respiratory distress.     Breath sounds: Rales present.  Abdominal:     Palpations: Abdomen is soft.     Tenderness: There is no abdominal tenderness.  Musculoskeletal:     Cervical back: Neck supple.     Right lower leg: Edema present.     Left lower leg: Edema present.     Comments: 3+ pitting edema  bilaterally.  Skin:    General: Skin is warm and dry.     Capillary Refill: Capillary refill takes less than 2 seconds.  Neurological:     General: No focal deficit present.     Mental Status: He is alert and oriented to person, place, and time. Mental status is at baseline.     Cranial Nerves: No cranial nerve deficit.     Sensory: No sensory deficit.     Motor: No weakness.     Coordination: Coordination normal.  Psychiatric:        Mood and Affect: Mood normal.     ED Results / Procedures / Treatments   Labs (all labs ordered are listed, but only abnormal results are displayed) Labs Reviewed  COMPREHENSIVE METABOLIC PANEL - Abnormal; Notable for the following components:      Result Value   BUN 63 (*)    Creatinine, Ser 3.04 (*)    Calcium 8.7 (*)    Total Protein 5.7 (*)    Albumin 3.0 (*)    GFR, Estimated 20 (*)    All other components within normal limits  BRAIN NATRIURETIC PEPTIDE - Abnormal; Notable for the following components:   B Natriuretic Peptide 304.9 (*)    All other components within normal limits  CBC WITH DIFFERENTIAL/PLATELET - Abnormal; Notable for the following components:   RBC 3.15 (*)    Hemoglobin 10.1 (*)    HCT 31.0 (*)    Platelets 113 (*)    All other components within normal limits  D-DIMER, QUANTITATIVE - Abnormal; Notable for the following components:   D-Dimer, Quant 2.27 (*)    All other components within normal limits  URINALYSIS, ROUTINE W REFLEX MICROSCOPIC - Abnormal; Notable for the following components:   Hgb urine dipstick SMALL (*)    Protein, ur 100 (*)    Leukocytes,Ua MODERATE (*)    Bacteria, UA FEW (*)    All other components within normal limits  BASIC METABOLIC PANEL - Abnormal; Notable for the following components:   BUN 61 (*)    Creatinine, Ser 3.11 (*)    GFR, Estimated 19 (*)    All other components within normal limits  CBC - Abnormal; Notable for the following components:   RBC 3.13 (*)    Hemoglobin 9.9  (*)    HCT 30.5 (*)    Platelets 111 (*)    All other components within normal limits  TROPONIN I (HIGH SENSITIVITY) - Abnormal; Notable for the following components:   Troponin I (High Sensitivity) 70 (*)    All other components within normal limits  TROPONIN I (HIGH SENSITIVITY) - Abnormal; Notable for the following components:   Troponin I (High Sensitivity) 67 (*)    All  other components within normal limits  CULTURE, BLOOD (ROUTINE X 2)  CULTURE, BLOOD (ROUTINE X 2)  URINE CULTURE  LACTIC ACID, PLASMA  LACTIC ACID, PLASMA  MAGNESIUM  HEMOGLOBIN A1C  CBG MONITORING, ED    EKG EKG Interpretation  Date/Time:  Tuesday September 18 2021 17:25:35 EDT Ventricular Rate:  63 PR Interval:  57 QRS Duration: 97 QT Interval:  462 QTC Calculation: 473 R Axis:   -37 Text Interpretation: Atrial fibrillation Left axis deviation Confirmed by Regan Lemming ([id number]) on 09/18/2021 5:59:34 PM  Radiology CT HEAD WO CONTRAST (5MM)  Result Date: 09/18/2021 CLINICAL DATA:  Mental status change, unknown cause EXAM: CT HEAD WITHOUT CONTRAST TECHNIQUE: Contiguous axial images were obtained from the base of the skull through the vertex without intravenous contrast. RADIATION DOSE REDUCTION: This exam was performed according to the departmental dose-optimization program which includes automated exposure control, adjustment of the mA and/or kV according to patient size and/or use of iterative reconstruction technique. COMPARISON:  CT head 02/20/2021 BRAIN: BRAIN Cerebral ventricle sizes are concordant with the degree of cerebral volume loss. Patchy and confluent areas of decreased attenuation are noted throughout the deep and periventricular white matter of the cerebral hemispheres bilaterally, compatible with chronic microvascular ischemic disease. No evidence of large-territorial acute infarction. No parenchymal hemorrhage. No mass lesion. No extra-axial collection. No mass effect or midline shift. No  hydrocephalus. Basilar cisterns are patent. Vascular: No hyperdense vessel. Atherosclerotic calcifications are present within the cavernous internal carotid arteries. Skull: No acute fracture or focal lesion. Sinuses/Orbits: Paranasal sinuses and mastoid air cells are clear. The orbits are unremarkable. Other: None. IMPRESSION: No acute intracranial abnormality. Electronically Signed   By: Iven Finn M.D.   On: 09/18/2021 21:40   DG Chest Portable 1 View  Result Date: 09/18/2021 CLINICAL DATA:  Hypotension chest pain EXAM: PORTABLE CHEST 1 VIEW COMPARISON:  08/30/2021, 10/07/2015 FINDINGS: Cardiomegaly with vascular congestion and mild diffuse interstitial opacities suspicious for mild pulmonary edema. No pleural effusion or pneumothorax. Mild ground-glass opacity at the bases. Aortic atherosclerosis. IMPRESSION: 1. Cardiomegaly with vascular congestion and mild interstitial edema 2. Hazy lung base opacities could reflect atelectasis, edema or mild pneumonia Electronically Signed   By: Donavan Foil M.D.   On: 09/18/2021 17:50    Procedures Procedures    Medications Ordered in ED Medications  diltiazem (CARDIZEM CD) 24 hr capsule 240 mg (240 mg Oral Given 09/19/21 1054)  isosorbide dinitrate (ISORDIL) tablet 30 mg (30 mg Oral Given 09/19/21 1054)  labetalol (NORMODYNE) tablet 100 mg (0 mg Oral Hold 09/19/21 1103)  cinacalcet (SENSIPAR) tablet 30 mg (30 mg Oral Given 09/19/21 1007)  pantoprazole (PROTONIX) EC tablet 40 mg (40 mg Oral Given 09/19/21 1001)  finasteride (PROSCAR) tablet 5 mg (5 mg Oral Given 09/19/21 1056)  tamsulosin (FLOMAX) capsule 0.4 mg (0.4 mg Oral Given 09/19/21 1001)  apixaban (ELIQUIS) tablet 2.5 mg (2.5 mg Oral Given 09/19/21 1001)  fluticasone furoate-vilanterol (BREO ELLIPTA) 100-25 MCG/ACT 1 puff (1 puff Inhalation Not Given 09/19/21 0832)    And  umeclidinium bromide (INCRUSE ELLIPTA) 62.5 MCG/ACT 1 puff (1 puff Inhalation Not Given 09/19/21 0832)  insulin aspart  (novoLOG) injection 0-6 Units ( Subcutaneous Not Given 09/19/21 0832)  sodium chloride flush (NS) 0.9 % injection 3 mL (3 mLs Intravenous Given 09/19/21 1100)  acetaminophen (TYLENOL) tablet 650 mg (has no administration in time range)    Or  acetaminophen (TYLENOL) suppository 650 mg (has no administration in time range)  furosemide (LASIX) injection 40 mg (  40 mg Intravenous Given 09/19/21 0620)  furosemide (LASIX) injection 40 mg (40 mg Intravenous Given 09/18/21 1857)    ED Course/ Medical Decision Making/ A&P                           Medical Decision Making Amount and/or Complexity of Data Reviewed Labs: ordered. Radiology: ordered.  Risk Prescription drug management. Decision regarding hospitalization.   86 year old male with medical history significant for DM 2, HTN, GERD, COPD, DVT, atrial fibrillation on Eliquis, OSA on CPAP, CKD, CHF (last EF September 12 2343 to 50%), sepsis secondary to enterococcal UTI, who presents to the emergency department with hypotension and bradycardia at his PCP office.  The patient is unsure if he is taking his torsemide.  He has reportedly been confused over the past few days. The patient denies any chest pain or shortness of breath. He states that he has had worsening bilateral lower extremity swelling over the past few days. The patient reportedly had SBP of 96 with his PCP. PCP was concerned with a potential infectious etiology. Pt has a hx of UTIs in the past. Denies urinary symptoms. EMS vitals 116/62, HR 42-60s in atrial flutter, RR 20. He received a 250cc NS bolus with EMS.   On arrival, the patient was afebrile, not bradycardic, hemodynamically stable, pulse 63, BP 121/70, MAP 83, RR 14, saturating 98% on room air.  3+ pitting edema noted bilaterally in a patient with known CHF history with some rales noted.  Pulmonary edema noted on chest x-ray.  EKG was performed and revealed atrial fibrillation, rate controlled.  Given the patient's history of CHF,  confirmed for CHF exacerbation, additionally considered ACS, PE although the patient is on anticoagulation, less likely dissection, less likely pneumonia or pneumothorax. Low concern for CVA with no focal deficit. Unclear if the patient has been compliant with his diruetic.   Despite the patient's initial report of bradycardia and hypotension, I am concerned for hypervolemia as etiology of the patient's presentation due to decompensated CHF.  He is not in respiratory distress and does not need BiPAP at this time.  He has no chest pain or shortness of breath, but he has significant lower extremity edema and evidence of pulmonary edema on chest x-ray.  Due to this, will administer 40 mg of IV Lasix.  EKG revealed rate controlled atrial fibrillation. Pt troponins mildly elevated at 70 and 67 respectively. Dimer elevated at 2.27 but given patient renal function will defer CTA imaging to evaluate for PE in the setting of hypotenisve and bradycardic episode. CT Head was performed without acute intracranial abnormality. The patient is neurologically without focal deficit. Hospitalist medicine was consulted for admission for CHF exacerbation given the patient's 3+ LE edema and evidence of pulmonary edema, shortness of breath. Dr. Myna Hidalgo evaluated the patient and accepted the patient in admission.  Final Clinical Impression(s) / ED Diagnoses Final diagnoses:  Acute on chronic congestive heart failure, unspecified heart failure type [facility])    Rx / DC Orders ED Discharge Orders     None         Regan Lemming, MD 09/19/21 1206

## 2021-09-19 ENCOUNTER — Encounter (HOSPITAL_COMMUNITY): Payer: Self-pay | Admitting: Family Medicine

## 2021-09-19 ENCOUNTER — Encounter (HOSPITAL_COMMUNITY): Payer: Medicare Other

## 2021-09-19 ENCOUNTER — Inpatient Hospital Stay (HOSPITAL_COMMUNITY): Payer: Medicare Other

## 2021-09-19 DIAGNOSIS — I5033 Acute on chronic diastolic (congestive) heart failure: Secondary | ICD-10-CM | POA: Diagnosis not present

## 2021-09-19 DIAGNOSIS — R7989 Other specified abnormal findings of blood chemistry: Secondary | ICD-10-CM | POA: Diagnosis present

## 2021-09-19 DIAGNOSIS — D696 Thrombocytopenia, unspecified: Secondary | ICD-10-CM | POA: Diagnosis present

## 2021-09-19 LAB — URINALYSIS, ROUTINE W REFLEX MICROSCOPIC
Bilirubin Urine: NEGATIVE
Glucose, UA: NEGATIVE mg/dL
Ketones, ur: NEGATIVE mg/dL
Nitrite: NEGATIVE
Protein, ur: 100 mg/dL — AB
Specific Gravity, Urine: 1.008 (ref 1.005–1.030)
pH: 6 (ref 5.0–8.0)

## 2021-09-19 LAB — BASIC METABOLIC PANEL
Anion gap: 12 (ref 5–15)
BUN: 61 mg/dL — ABNORMAL HIGH (ref 8–23)
CO2: 25 mmol/L (ref 22–32)
Calcium: 9 mg/dL (ref 8.9–10.3)
Chloride: 106 mmol/L (ref 98–111)
Creatinine, Ser: 3.11 mg/dL — ABNORMAL HIGH (ref 0.61–1.24)
GFR, Estimated: 19 mL/min — ABNORMAL LOW (ref >60)
Glucose, Bld: 78 mg/dL (ref 70–99)
Potassium: 4 mmol/L (ref 3.5–5.1)
Sodium: 143 mmol/L (ref 135–145)

## 2021-09-19 LAB — URINE CULTURE: Culture: NO GROWTH

## 2021-09-19 LAB — GLUCOSE, CAPILLARY
Glucose-Capillary: 135 mg/dL — ABNORMAL HIGH (ref 70–99)
Glucose-Capillary: 160 mg/dL — ABNORMAL HIGH (ref 70–99)

## 2021-09-19 LAB — CBC
HCT: 30.5 % — ABNORMAL LOW (ref 39.0–52.0)
Hemoglobin: 9.9 g/dL — ABNORMAL LOW (ref 13.0–17.0)
MCH: 31.6 pg (ref 26.0–34.0)
MCHC: 32.5 g/dL (ref 30.0–36.0)
MCV: 97.4 fL (ref 80.0–100.0)
Platelets: 111 10*3/uL — ABNORMAL LOW (ref 150–400)
RBC: 3.13 MIL/uL — ABNORMAL LOW (ref 4.22–5.81)
RDW: 14.6 % (ref 11.5–15.5)
WBC: 4.8 10*3/uL (ref 4.0–10.5)
nRBC: 0 % (ref 0.0–0.2)

## 2021-09-19 LAB — CBG MONITORING, ED
Glucose-Capillary: 76 mg/dL (ref 70–99)
Glucose-Capillary: 88 mg/dL (ref 70–99)

## 2021-09-19 LAB — MAGNESIUM: Magnesium: 1.7 mg/dL (ref 1.7–2.4)

## 2021-09-19 LAB — BRAIN NATRIURETIC PEPTIDE: B Natriuretic Peptide: 304.9 pg/mL — ABNORMAL HIGH (ref 0.0–100.0)

## 2021-09-19 MED ORDER — PANTOPRAZOLE SODIUM 40 MG PO TBEC
40.0000 mg | DELAYED_RELEASE_TABLET | Freq: Every day | ORAL | Status: DC
  Administered 2021-09-19 – 2021-09-24 (×6): 40 mg via ORAL
  Filled 2021-09-19 (×6): qty 1

## 2021-09-19 MED ORDER — SODIUM CHLORIDE 0.9% FLUSH
3.0000 mL | Freq: Two times a day (BID) | INTRAVENOUS | Status: DC
  Administered 2021-09-19 – 2021-09-24 (×11): 3 mL via INTRAVENOUS

## 2021-09-19 MED ORDER — ACETAMINOPHEN 325 MG PO TABS
650.0000 mg | ORAL_TABLET | Freq: Four times a day (QID) | ORAL | Status: DC | PRN
  Administered 2021-09-21 – 2021-09-22 (×2): 650 mg via ORAL
  Filled 2021-09-19 (×2): qty 2

## 2021-09-19 MED ORDER — FINASTERIDE 5 MG PO TABS
5.0000 mg | ORAL_TABLET | Freq: Every day | ORAL | Status: DC
  Administered 2021-09-19 – 2021-09-24 (×6): 5 mg via ORAL
  Filled 2021-09-19 (×6): qty 1

## 2021-09-19 MED ORDER — DILTIAZEM HCL ER COATED BEADS 240 MG PO CP24
240.0000 mg | ORAL_CAPSULE | Freq: Every day | ORAL | Status: DC
  Administered 2021-09-19 – 2021-09-24 (×6): 240 mg via ORAL
  Filled 2021-09-19 (×6): qty 1

## 2021-09-19 MED ORDER — ISOSORBIDE DINITRATE 10 MG PO TABS
30.0000 mg | ORAL_TABLET | Freq: Three times a day (TID) | ORAL | Status: DC
  Administered 2021-09-19 – 2021-09-20 (×6): 30 mg via ORAL
  Filled 2021-09-19 (×2): qty 3
  Filled 2021-09-19: qty 1
  Filled 2021-09-19: qty 3
  Filled 2021-09-19: qty 1
  Filled 2021-09-19: qty 3
  Filled 2021-09-19: qty 1
  Filled 2021-09-19: qty 3

## 2021-09-19 MED ORDER — FUROSEMIDE 10 MG/ML IJ SOLN
40.0000 mg | Freq: Two times a day (BID) | INTRAMUSCULAR | Status: DC
  Administered 2021-09-19 – 2021-09-21 (×5): 40 mg via INTRAVENOUS
  Filled 2021-09-19 (×5): qty 4

## 2021-09-19 MED ORDER — TAMSULOSIN HCL 0.4 MG PO CAPS
0.4000 mg | ORAL_CAPSULE | Freq: Every day | ORAL | Status: DC
  Administered 2021-09-19 – 2021-09-24 (×6): 0.4 mg via ORAL
  Filled 2021-09-19 (×6): qty 1

## 2021-09-19 MED ORDER — APIXABAN 2.5 MG PO TABS
2.5000 mg | ORAL_TABLET | Freq: Two times a day (BID) | ORAL | Status: DC
  Administered 2021-09-19 – 2021-09-24 (×12): 2.5 mg via ORAL
  Filled 2021-09-19 (×12): qty 1

## 2021-09-19 MED ORDER — CINACALCET HCL 30 MG PO TABS
30.0000 mg | ORAL_TABLET | Freq: Every day | ORAL | Status: DC
  Administered 2021-09-19 – 2021-09-24 (×6): 30 mg via ORAL
  Filled 2021-09-19 (×6): qty 1

## 2021-09-19 MED ORDER — FLUTICASONE FUROATE-VILANTEROL 100-25 MCG/ACT IN AEPB
1.0000 | INHALATION_SPRAY | Freq: Every day | RESPIRATORY_TRACT | Status: DC
  Administered 2021-09-20 – 2021-09-24 (×5): 1 via RESPIRATORY_TRACT
  Filled 2021-09-19: qty 28

## 2021-09-19 MED ORDER — ACETAMINOPHEN 650 MG RE SUPP: 650.0000 mg | Freq: Four times a day (QID) | RECTAL | Status: DC | PRN

## 2021-09-19 MED ORDER — INSULIN ASPART 100 UNIT/ML IJ SOLN
0.0000 [IU] | Freq: Three times a day (TID) | INTRAMUSCULAR | Status: DC
  Administered 2021-09-20: 1 [IU] via SUBCUTANEOUS

## 2021-09-19 MED ORDER — LABETALOL HCL 100 MG PO TABS
100.0000 mg | ORAL_TABLET | Freq: Two times a day (BID) | ORAL | Status: DC
  Filled 2021-09-19: qty 1

## 2021-09-19 MED ORDER — UMECLIDINIUM BROMIDE 62.5 MCG/ACT IN AEPB
1.0000 | INHALATION_SPRAY | Freq: Every day | RESPIRATORY_TRACT | Status: DC
  Administered 2021-09-20 – 2021-09-24 (×5): 1 via RESPIRATORY_TRACT
  Filled 2021-09-19: qty 7

## 2021-09-19 MED ORDER — TECHNETIUM TO 99M ALBUMIN AGGREGATED
4.1000 | Freq: Once | INTRAVENOUS | Status: AC | PRN
Start: 2021-09-19 — End: 2021-09-19
  Administered 2021-09-19: 4.1 via INTRAVENOUS

## 2021-09-19 NOTE — Progress Notes (Signed)
Patient states that he is not sure if he uses a CPAP at home or not.  He does not want to use one tonight.  RT will continue to monitor.

## 2021-09-19 NOTE — Progress Notes (Signed)
PROGRESS NOTE    LARUE LIGHTNER  FKC:[id number] DOB: 01-05-1936 DOA: 09/18/2021 PCP: Rogers Blocker, MD    Brief Narrative:  86 year old gentleman with history of cognitive dysfunction, chronic atrial fibrillation on Eliquis, CKD stage IV, history of DVT, sleep apnea, hypertension lives alone at home brought to ER for evaluation of shortness of breath, low blood pressures and low heart rate and more confusion found when he went to PCP office.  Patient lives alone.  His son helps him to go to doctors.  He took him to routine checkup yesterday, where he was found with blood pressure 96 and heart rate in 40s and patient looked confused and weak so sent to ER for work-up.  In the emergency room he was given 250 mL of saline before arrival to ER and he was hemodynamically stable since then. Patient was on room air.  EKG with A-fib with heart rate 63.  CT head was normal.  BUN and creatinine 63/3.04 at about his levels.  Spurious platelet counts that ultimately showed adequate.  Lactic acid normal.  D-dimer 2.2.  Blood cultures were collected, he was given 1 dose of IV Lasix.  Admitted to hospital due to significant symptoms.   Assessment & Plan  Acute on chronic combined heart failure, leg edema, unexplained shortness of breath: Suspect heart failure exacerbation.  Patient has difficulty compliance with medications at home.  He does have significant bilateral leg swelling. Currently remains on IV Lasix 40 mg twice daily and diuresing well.  Continue to monitor intake and output monitoring.  Closely monitor renal functions.  With the patient's cognitive decline, it will be difficult to maintain his cardiac status, he will need more supervised living.  See discussion below.  Elevated D-dimer, history of DVT and noncompliance to Eliquis: The patient is on renally dosed Eliquis, angiogram is not possible.  A VQ scan was done that showed low probability of PE. No clinical evidence of tachycardia or  dyspnea.  Chronic A-fib, reported bradycardia: Rate controlled.  Therapeutic on Eliquis.  No evidence of bradycardia in the hospital.  We will continue diltiazem and labetalol for now and monitor while he is in the hospital.  Obstructive sleep apnea: Using CPAP at night.  Weakness and confusion: Deconditioned, cognitive decline.  No acute neurological findings of focal deficits. CT head was essentially normal. Fall precautions.  Delirium precautions.  PT/OT. Palliative care consultation, coordination of care he will probably need skilled nursing rehab as well assisted living facility level of care after discharge.  I discussed this with patient's son.  He agrees.  Type 2 diabetes: Well-controlled.  On diet controlled at home.  No indication to treat at this time.  CKD stage IV: Renal functions at about his baseline.  High risk of decompensation.  Continue monitoring closely.    DVT prophylaxis: apixaban (ELIQUIS) tablet 2.5 mg Start: 09/19/21 1000 apixaban (ELIQUIS) tablet 2.5 mg   Code Status: Full code, will consult palliative care.  Will need goals of care discussions. Family Communication: Son on the phone Disposition Plan: Status is: Inpatient Remains inpatient appropriate because: IV diuresis, significant debility.  Unsafe discharge.     Consultants:  None Palliative  Procedures:  None  Antimicrobials:  None   Subjective: Patient seen and examined.  Still in the emergency room.  Denies any complaints.  Pleasant and confused.  He is oriented to himself but not oriented to time and place.  He is not sure how many medications he takes.  Called and  discussed with patient's son.  Objective: Vitals:   09/19/21 1103 09/19/21 1115 09/19/21 1130 09/19/21 1145  BP:  128/69 124/69 123/64  Pulse: 63 63 64 60  Resp:  '13 13 13  '$ Temp:      SpO2:  100% 100% 100%    Intake/Output Summary (Last 24 hours) at 09/19/2021 1321 Last data filed at 09/19/2021 1100 Gross per 24 hour   Intake 3 ml  Output 1400 ml  Net -1397 ml   There were no vitals filed for this visit.  Examination:  General exam: Appears calm and comfortable  Alert and awake, oriented to self.  Flat affect. Respiratory system: Mostly clear.  He does have some basal crackles on posterior bases.   Cardiovascular system: S1 & S2 heard, irregularly irregular.  2+ bilateral leg edema. Gastrointestinal system: Abdomen is nondistended, soft and nontender. No organomegaly or masses felt. Normal bowel sounds heard    Data Reviewed: I have personally reviewed following labs and imaging studies  CBC: Recent Labs  Lab 09/18/21 1805 09/19/21 0448  WBC 4.4 4.8  NEUTROABS 3.3  --   HGB 10.1* 9.9*  HCT 31.0* 30.5*  MCV 98.4 97.4  PLT 113* 027*   Basic Metabolic Panel: Recent Labs  Lab 09/18/21 1805 09/19/21 0448  NA 139 143  K 4.1 4.0  CL 107 106  CO2 22 25  GLUCOSE 93 78  BUN 63* 61*  CREATININE 3.04* 3.11*  CALCIUM 8.7* 9.0  MG  --  1.7   GFR: CrCl cannot be calculated (Unknown ideal weight.). Liver Function Tests: Recent Labs  Lab 09/18/21 1805  AST 20  ALT 26  ALKPHOS 110  BILITOT 1.1  PROT 5.7*  ALBUMIN 3.0*   No results for input(s): "LIPASE", "AMYLASE" in the last 168 hours. No results for input(s): "AMMONIA" in the last 168 hours. Coagulation Profile: No results for input(s): "INR", "PROTIME" in the last 168 hours. Cardiac Enzymes: No results for input(s): "CKTOTAL", "CKMB", "CKMBINDEX", "TROPONINI" in the last 168 hours. BNP (last 3 results) No results for input(s): "PROBNP" in the last 8760 hours. HbA1C: No results for input(s): "HGBA1C" in the last 72 hours. CBG: Recent Labs  Lab 09/19/21 0820 09/19/21 1308  GLUCAP 76 88   Lipid Profile: No results for input(s): "CHOL", "HDL", "LDLCALC", "TRIG", "CHOLHDL", "LDLDIRECT" in the last 72 hours. Thyroid Function Tests: No results for input(s): "TSH", "T4TOTAL", "FREET4", "T3FREE", "THYROIDAB" in the last 72  hours. Anemia Panel: No results for input(s): "VITAMINB12", "FOLATE", "FERRITIN", "TIBC", "IRON", "RETICCTPCT" in the last 72 hours. Sepsis Labs: Recent Labs  Lab 09/18/21 1805 09/18/21 2000  LATICACIDVEN 1.2 1.3    Recent Results (from the past 240 hour(s))  Culture, blood (routine x 2)     Status: None (Preliminary result)   Collection Time: 09/18/21 12:05 PM   Specimen: BLOOD  Result Value Ref Range Status   Specimen Description BLOOD RIGHT UPPER ARM  Final   Special Requests   Final    BOTTLES DRAWN AEROBIC AND ANAEROBIC Blood Culture adequate volume   Culture   Final    NO GROWTH < 12 HOURS Performed at [facility], [place] [address], [place], [place] [address]    Report Status PENDING  Incomplete  Culture, blood (routine x 2)     Status: None (Preliminary result)   Collection Time: 09/18/21  5:57 PM   Specimen: BLOOD  Result Value Ref Range Status   Specimen Description BLOOD BLOOD RIGHT FOREARM  Final  Special Requests   Final    BOTTLES DRAWN AEROBIC AND ANAEROBIC Blood Culture adequate volume   Culture   Final    NO GROWTH < 12 HOURS Performed at [facility], [place] [address] [place], [place] [address]    Report Status PENDING  Incomplete         Radiology Studies: NM Pulmonary Perfusion  Result Date: 09/19/2021 CLINICAL DATA:  Positive D-dimer, clinical suspicion of pulmonary embolism EXAM: NUCLEAR MEDICINE PERFUSION LUNG SCAN TECHNIQUE: Perfusion images were obtained in multiple projections after intravenous injection of radiopharmaceutical. Ventilation scans intentionally deferred if perfusion scan and chest x-ray adequate for interpretation during COVID 19 epidemic. RADIOPHARMACEUTICALS:  4.1 mCi Tc-81mMAA IV COMPARISON:  Chest radiograph done on 09/19/2018 FINDINGS: There are no wedge-shaped perfusion defects. There is subtle decrease in activity in the posterior mid and lower lung fields. IMPRESSION: Imaging findings suggest low probability for  pulmonary embolism. Electronically Signed   By: PElmer PickerM.D.   On: 09/19/2021 12:53   CT HEAD WO CONTRAST (5MM)  Result Date: 09/18/2021 CLINICAL DATA:  Mental status change, unknown cause EXAM: CT HEAD WITHOUT CONTRAST TECHNIQUE: Contiguous axial images were obtained from the base of the skull through the vertex without intravenous contrast. RADIATION DOSE REDUCTION: This exam was performed according to the departmental dose-optimization program which includes automated exposure control, adjustment of the mA and/or kV according to patient size and/or use of iterative reconstruction technique. COMPARISON:  CT head 02/20/2021 BRAIN: BRAIN Cerebral ventricle sizes are concordant with the degree of cerebral volume loss. Patchy and confluent areas of decreased attenuation are noted throughout the deep and periventricular white matter of the cerebral hemispheres bilaterally, compatible with chronic microvascular ischemic disease. No evidence of large-territorial acute infarction. No parenchymal hemorrhage. No mass lesion. No extra-axial collection. No mass effect or midline shift. No hydrocephalus. Basilar cisterns are patent. Vascular: No hyperdense vessel. Atherosclerotic calcifications are present within the cavernous internal carotid arteries. Skull: No acute fracture or focal lesion. Sinuses/Orbits: Paranasal sinuses and mastoid air cells are clear. The orbits are unremarkable. Other: None. IMPRESSION: No acute intracranial abnormality. Electronically Signed   By: MIven FinnM.D.   On: 09/18/2021 21:40   DG Chest Portable 1 View  Result Date: 09/18/2021 CLINICAL DATA:  Hypotension chest pain EXAM: PORTABLE CHEST 1 VIEW COMPARISON:  08/30/2021, 10/07/2015 FINDINGS: Cardiomegaly with vascular congestion and mild diffuse interstitial opacities suspicious for mild pulmonary edema. No pleural effusion or pneumothorax. Mild ground-glass opacity at the bases. Aortic atherosclerosis. IMPRESSION: 1.  Cardiomegaly with vascular congestion and mild interstitial edema 2. Hazy lung base opacities could reflect atelectasis, edema or mild pneumonia Electronically Signed   By: KDonavan FoilM.D.   On: 09/18/2021 17:50        Scheduled Meds:  apixaban  2.5 mg Oral BID   cinacalcet  30 mg Oral Q breakfast   diltiazem  240 mg Oral Daily   finasteride  5 mg Oral Daily   fluticasone furoate-vilanterol  1 puff Inhalation Daily   And   umeclidinium bromide  1 puff Inhalation Daily   furosemide  40 mg Intravenous Q12H   insulin aspart  0-6 Units Subcutaneous TID WC   isosorbide dinitrate  30 mg Oral TID   labetalol  100 mg Oral BID   pantoprazole  40 mg Oral Daily   sodium chloride flush  3 mL Intravenous Q12H   tamsulosin  0.4 mg Oral Daily   Continuous Infusions:   LOS: 1  day    Time spent: 35 minutes    Barb Merino, MD [facility] Pager [phone]

## 2021-09-19 NOTE — Evaluation (Signed)
Physical Therapy Evaluation Patient Details Name: Roy [name] MRN: [id number] DOB: 25-Jul-1935 Today's Date: 09/19/2021  History of Present Illness  Pt is an 86 y/o male admitted 6/28 secondary to hypotension and weakness. Thought to be secondary to CHF. PMH includes afib on Eliquis, HF, DVT, asthma, COPD, DM, pulmonary HTN, GERD, HTN, BPH, CKD IV, OSA on CPAP.  Clinical Impression  Pt admitted secondary to problem above with deficits below. Pt requiring mod A for bed mobility, transfers, and to take side steps at EOB. Pt with increased difficulty taking steps this session. Feel pt is at increased risk for falls and will have difficulty performing mobility/ADL tasks at home. Recommending SNF level therapies at d/c to address deficits. Will continue to follow acutely.      Recommendations for follow up therapy are one component of a multi-disciplinary discharge planning process, led by the attending physician.  Recommendations may be updated based on patient status, additional functional criteria and insurance authorization.  Follow Up Recommendations Skilled nursing-short term rehab (<3 hours/day) Can patient physically be transported by private vehicle: No    Assistance Recommended at Discharge Frequent or constant Supervision/Assistance  Patient can return home with the following  Assistance with cooking/housework;Direct supervision/assist for medications management;Direct supervision/assist for financial management;Assist for transportation;Help with stairs or ramp for entrance;A lot of help with walking and/or transfers;A lot of help with bathing/dressing/bathroom    Equipment Recommendations None recommended by PT  Recommendations for Other Services       Functional Status Assessment Patient has had a recent decline in their functional status and demonstrates the ability to make significant improvements in function in a reasonable and predictable amount of time.     Precautions  / Restrictions Precautions Precautions: Fall Restrictions Weight Bearing Restrictions: No      Mobility  Bed Mobility Overal bed mobility: Needs Assistance Bed Mobility: Supine to Sit, Sit to Supine     Supine to sit: Mod assist Sit to supine: Mod assist   General bed mobility comments: assist for trunk and LEs. Increased time required and cues for sequencing.    Transfers Overall transfer level: Needs assistance Equipment used: 1 person hand held assist Transfers: Sit to/from Stand Sit to Stand: Mod assist           General transfer comment: Mod A for lift assist and steadying to stand. Pt holding to PT arms for support    Ambulation/Gait Ambulation/Gait assistance: Min assist, Mod assist   Assistive device: 1 person hand held assist         General Gait Details: Was able to take a few side steps at EOB, however, steps were very labored and pt unsteady requiring min to mod A.  Stairs            Wheelchair Mobility    Modified Rankin (Stroke Patients Only)       Balance Overall balance assessment: Needs assistance Sitting-balance support: No upper extremity supported Sitting balance-Leahy Scale: Fair     Standing balance support: Bilateral upper extremity supported Standing balance-Leahy Scale: Poor Standing balance comment: REliant on UE and external support                             Pertinent Vitals/Pain Pain Assessment Pain Assessment: No/denies pain    Home Living Family/patient expects to be discharged to:: Private residence Living Arrangements: Alone Available Help at Discharge: Family;Available PRN/intermittently Type of Home: House Home Access: Stairs to  enter Entrance Stairs-Rails: None Entrance Stairs-Number of Steps: 1   Home Layout: One level Home Equipment: Cane - single Barista (2 wheels);Wheelchair - manual      Prior Function Prior Level of Function : Independent/Modified Independent;History  of Falls (last six months)             Mobility Comments: Typically mod indep ambulating with SPC or RW; no longer drives ADLs Comments: Son checks on patient, assists with transportation, meals, med management     Hand Dominance        Extremity/Trunk Assessment   Upper Extremity Assessment Upper Extremity Assessment: Defer to OT evaluation    Lower Extremity Assessment Lower Extremity Assessment: Generalized weakness (increased swelling bilaterally)    Cervical / Trunk Assessment Cervical / Trunk Assessment: Kyphotic  Communication   Communication: No difficulties  Cognition Arousal/Alertness: Awake/alert Behavior During Therapy: WFL for tasks assessed/performed Overall Cognitive Status: No family/caregiver present to determine baseline cognitive functioning                                 General Comments: Slow processing noted.        General Comments General comments (skin integrity, edema, etc.): No family present    Exercises     Assessment/Plan    PT Assessment Patient needs continued PT services  PT Problem List Decreased strength;Decreased activity tolerance;Decreased balance;Decreased mobility;Decreased cognition;Decreased knowledge of use of DME;Cardiopulmonary status limiting activity       PT Treatment Interventions DME instruction;Gait training;Stair training;Functional mobility training;Therapeutic activities;Therapeutic exercise;Balance training;Patient/family education    PT Goals (Current goals can be found in the Care Plan section)  Acute Rehab PT Goals Patient Stated Goal: to go home PT Goal Formulation: With patient Time For Goal Achievement: 10/03/21 Potential to Achieve Goals: Good    Frequency Min 2X/week     Co-evaluation               AM-PAC PT "6 Clicks" Mobility  Outcome Measure Help needed turning from your back to your side while in a flat bed without using bedrails?: A Little Help needed moving  from lying on your back to sitting on the side of a flat bed without using bedrails?: A Lot Help needed moving to and from a bed to a chair (including a wheelchair)?: A Lot Help needed standing up from a chair using your arms (e.g., wheelchair or bedside chair)?: A Lot Help needed to walk in hospital room?: A Lot Help needed climbing 3-5 steps with a railing? : A Lot 6 Click Score: 13    End of Session Equipment Utilized During Treatment: Gait belt Activity Tolerance: Patient tolerated treatment well Patient left: in bed;with call bell/phone within reach (on stretcher in ED with transport staff present) Nurse Communication: Mobility status PT Visit Diagnosis: Unsteadiness on feet (R26.81);Muscle weakness (generalized) (M62.81);Difficulty in walking, not elsewhere classified (R26.2)    Time: 1219-7588 PT Time Calculation (min) (ACUTE ONLY): 18 min   Charges:   PT Evaluation $PT Eval Moderate Complexity: 1 Mod          Reuel Derby, PT, DPT  Acute Rehabilitation Services  Office: [phone]   Rudean Hitt 09/19/2021, 3:26 PM

## 2021-09-19 NOTE — Progress Notes (Signed)
Heart Failure Navigator Progress Note  Assessed for Heart & Vascular TOC clinic readiness.  Patient does not meet criteria due to is a [facility] patient. Earnestine Leys, BSN, RN Heart Failure Transport planner Only

## 2021-09-19 NOTE — ED Notes (Signed)
Pt to VQ scan.

## 2021-09-19 NOTE — ED Notes (Signed)
Nuclear medicine called this RN, stated VQ scan should likely occur at around 1200 today.

## 2021-09-19 NOTE — H&P (Addendum)
History and Physical    Roy [name] PPJ:[id number] DOB: January 09, 1936 DOA: 09/18/2021  PCP: Rogers Blocker, MD   Patient coming from: Home   Chief Complaint: SOB, low BP and HR, increased weakness and confusion  HPI: Roy [name] is a pleasant 86 y.o. male with medical history significant for atrial fibrillation on Eliquis, CKD stage IV, HFmrEF, history of DVT, OSA on BiPAP, and hypertension, who presented to the emergency department for evaluation of shortness of breath, low blood pressure and low heart rate with his PCP, and increased weakness and confusion.  Patient has no complaints when interviewed in the ED, reports that he lives alone and takes his medications that his son lives out for him.  He saw his PCP today who thought the patient was more generally weak and confused than usual, noted him to be hypotensive and bradycardic, and called EMS for transport to the hospital.  SBP was reportedly 96 with heart rate in the 40s.  For EMS, SBP was 110s and heart rate 42 to 60s.  He was given 250 cc of saline prior to arrival in the ED.  ED Course: Upon arrival to the ED, patient is found to be afebrile and saturating well on room air with normal heart rate and stable blood pressure.  EKG features atrial fibrillation with rate 63 and LAD.  Head CT was negative for acute intracranial abnormality.  Chest x-ray features cardiomegaly, vascular congestion, and mild interstitial edema.  Chemistry panel notable for BUN 63 and creatinine 3.04.  CBC with stable normocytic anemia and a new thrombocytopenia with platelets around 13,000.  Troponin was mildly elevated and decreasing.  Lactic acid normal x2.  D-dimer elevated to 2.27.  Blood cultures were collected on arrival to the ED, patient was given 40 mg IV Lasix, BNP was ordered, and VQ scan was ordered.  Review of Systems:  All other systems reviewed and apart from HPI, are negative.  Past Medical History:  Diagnosis Date   A-fib [facility])    Abscess  of external ear, left 01/11/2016   Acute deep vein thrombosis (DVT) of femoral vein of left lower extremity (HCC)    VQ scan negative/low probability   Arthritis HANDS/ THUMBS/ SHOULDERS   Bacteremia due to Enterococcus 10/09/2015   Bladder calculi    Chronic obstructive asthma, unspecified FOLLOWED BY DR Tarri Fuller YOUNG  VISIT 10-14-2011 IN [facility]IC   COPD (chronic obstructive pulmonary disease) (HCC)    Cyst on ear    left   Diabetes mellitus    DVT (deep venous thrombosis) (HCC)    Dyspnea on exertion    GERD (gastroesophageal reflux disease)    Hematuria    History of prostate cancer 2010-- S/P  EXTERNAL RADIATION   Hydronephrosis, left    Hypertension    Infected cyst of skin 01/25/2016   OSA on CPAP    wears CPAP   Sepsis ([place])    Sepsis secondary to UTI ([place]) 10/06/2015   UTI (lower urinary tract infection) 10/06/2015   Wears glasses     Past Surgical History:  Procedure Laterality Date   CARPAL TUNNEL RELEASE  05-28-2005   LEFT   CYSTOSCOPY N/A 09/26/2012   Procedure: CYSTOSCOPY, clot evacuation,fulgeration of bladder, bladder biopsy, transurethreal vaporation of prostat;  Surgeon: Alexis Frock, MD;  Location: [facility];  Service: Urology;  Laterality: N/A;   CYSTOSCOPY W/ RETROGRADES Bilateral 10/14/2015   Procedure: CYSTOSCOPY WITH BILATERAL RETROGRADE PYELOGRAM/ FULGERATION WITH LITHOLAPAXY;  Surgeon: Festus Aloe, MD;  Location:  [facility];  Service: Urology;  Laterality: Bilateral;   CYSTOSCOPY WITH LITHOLAPAXY  10/18/2011   Procedure: CYSTOSCOPY WITH LITHOLAPAXY;  Surgeon: Hanley Ben, MD;  Location: [place];  Service: Urology;  Laterality: N/A;  1 HOUR NO STENT H# [id number] CELL# [phone]  MEDICARE BCBS   EAR CYST EXCISION Left 02/23/2016   Procedure: EXCISION LEFT EAR CYST;  Surgeon: Izora Gala, MD;  Location: [place];  Service: ENT;  Laterality: Left;   GANGLION CYST EXCISION  1988  (APPROX)   [place]   TEE WITHOUT  CARDIOVERSION N/A 10/11/2015   Procedure: TRANSESOPHAGEAL ECHOCARDIOGRAM (TEE);  Surgeon: Sanda Klein, MD;  Location: [facility] ENDOSCOPY;  Service: Cardiovascular;  Laterality: N/A;    Social History:   reports that he quit smoking about 23 years ago. His smoking use included cigarettes. He has a 12.50 pack-year smoking history. He has never used smokeless tobacco. He reports that he does not currently use alcohol. He reports that he does not use drugs.  No Known Allergies  Family History  Problem Relation Age of Onset   Diabetes Father      Prior to Admission medications   Medication Sig Start Date End Date Taking? Authorizing Provider  cinacalcet (SENSIPAR) 30 MG tablet Take 30 mg by mouth daily. 06/06/19   [provider]  CONTOUR NEXT TEST test strip daily. 12/10/19   [provider]  diltiazem (CARDIZEM CD) 240 MG 24 hr capsule Take 240 mg by mouth daily.    [provider]  ELIQUIS 2.5 MG TABS tablet Take 2.5 mg by mouth 2 (two) times daily. 04/14/20   [provider]  finasteride (PROSCAR) 5 MG tablet Take 5 mg by mouth daily.    [provider]  Fluticasone-Umeclidin-Vilant (TRELEGY ELLIPTA) 100-62.5-25 MCG/INH AEPB Inhale 1 puff into the lungs daily. 02/29/20   Baird Lyons D, MD  gabapentin (NEURONTIN) 100 MG capsule Take 100 mg by mouth daily.    [provider]  isosorbide dinitrate (ISORDIL) 30 MG tablet TAKE 1 TABLET BY MOUTH THREE TIMES DAILY Patient taking differently: Take 30 mg by mouth 3 (three) times daily. 09/11/21   Patwardhan, Reynold Bowen, MD  labetalol (NORMODYNE) 100 MG tablet Take 1 tablet (100 mg total) by mouth 2 (two) times daily. 06/21/20   [name], MD  Microlet Lancets MISC  12/10/19   [provider]  pantoprazole (PROTONIX) 40 MG tablet Take 40 mg by mouth daily.    [provider]  potassium chloride SA (KLOR-CON M) 20 MEQ tablet Take 20 mEq by mouth daily.    [provider]  tamsulosin (FLOMAX) 0.4 MG CAPS capsule Take 1 capsule (0.4 mg total) by mouth daily. 10/18/15   Bonnell Public, MD  torsemide (DEMADEX) 100 MG tablet Take 0.5 tablets (50 mg total) by mouth daily. 02/24/21   Elmarie Shiley, MD    Physical Exam: Vitals:   09/19/21 0245 09/19/21 0300 09/19/21 0315 09/19/21 0330  BP: (!) 145/92 (!) 148/82 (!) 152/83 (!) 146/86  Pulse: 63 63 62 63  Resp: '20 12 15 15  '$ Temp:      SpO2: 100% 100% 100% 100%    Constitutional: NAD, calm  Eyes: PERTLA, lids and conjunctivae normal ENMT: Mucous membranes are moist. Posterior pharynx clear of any exudate or lesions.   Neck: supple, no masses  Respiratory:  no wheezing. Speaking full sentences. No accessory muscle use.  Cardiovascular: S1 & S2  heard, regular rate and rhythm. Pitting edema into thighs.  Abdomen: No distension, no tenderness, soft. Bowel sounds active.  Musculoskeletal: no clubbing / cyanosis. No joint deformity upper and lower extremities.   Skin: no significant rashes, lesions, ulcers. Warm, dry, well-perfused. Neurologic: CN 2-12 grossly intact, mild dysarthria. Sensation to light touch intact. Moving all extremities. Alert and oriented to person, place, and situation.  Psychiatric: Pleasant. Cooperative.    Labs and Imaging on Admission: I have personally reviewed following labs and imaging studies  CBC: Recent Labs  Lab 09/18/21 1805  WBC 4.4  NEUTROABS 3.3  HGB 10.1*  HCT 31.0*  MCV 98.4  PLT 009*   Basic Metabolic Panel: Recent Labs  Lab 09/18/21 1805  NA 139  K 4.1  CL 107  CO2 22  GLUCOSE 93  BUN 63*  CREATININE 3.04*  CALCIUM 8.7*   GFR: CrCl cannot be calculated (Unknown ideal weight.). Liver Function Tests: Recent Labs  Lab 09/18/21 1805  AST 20  ALT 26  ALKPHOS 110  BILITOT 1.1  PROT 5.7*  ALBUMIN 3.0*   No results for input(s): "LIPASE", "AMYLASE" in the last 168 hours. No results for input(s): "AMMONIA" in the last 168  hours. Coagulation Profile: No results for input(s): "INR", "PROTIME" in the last 168 hours. Cardiac Enzymes: No results for input(s): "CKTOTAL", "CKMB", "CKMBINDEX", "TROPONINI" in the last 168 hours. BNP (last 3 results) No results for input(s): "PROBNP" in the last 8760 hours. HbA1C: No results for input(s): "HGBA1C" in the last 72 hours. CBG: No results for input(s): "GLUCAP" in the last 168 hours. Lipid Profile: No results for input(s): "CHOL", "HDL", "LDLCALC", "TRIG", "CHOLHDL", "LDLDIRECT" in the last 72 hours. Thyroid Function Tests: No results for input(s): "TSH", "T4TOTAL", "FREET4", "T3FREE", "THYROIDAB" in the last 72 hours. Anemia Panel: No results for input(s): "VITAMINB12", "FOLATE", "FERRITIN", "TIBC", "IRON", "RETICCTPCT" in the last 72 hours. Urine analysis:    Component Value Date/Time   COLORURINE YELLOW 09/18/2021 0008   APPEARANCEUR CLEAR 09/18/2021 0008   LABSPEC 1.008 09/18/2021 0008   PHURINE 6.0 09/18/2021 0008   GLUCOSEU NEGATIVE 09/18/2021 0008   HGBUR SMALL (A) 09/18/2021 0008   BILIRUBINUR NEGATIVE 09/18/2021 0008   KETONESUR NEGATIVE 09/18/2021 0008   PROTEINUR 100 (A) 09/18/2021 0008   UROBILINOGEN 0.2 09/26/2012 1222   NITRITE NEGATIVE 09/18/2021 0008   LEUKOCYTESUR MODERATE (A) 09/18/2021 0008   Sepsis Labs: '@LABRCNTIP'$ (procalcitonin:4,lacticidven:4) )No results found for this or any previous visit (from the past 240 hour(s)).   Radiological Exams on Admission: CT HEAD WO CONTRAST (5MM)  Result Date: 09/18/2021 CLINICAL DATA:  Mental status change, unknown cause EXAM: CT HEAD WITHOUT CONTRAST TECHNIQUE: Contiguous axial images were obtained from the base of the skull through the vertex without intravenous contrast. RADIATION DOSE REDUCTION: This exam was performed according to the departmental dose-optimization program which includes automated exposure control, adjustment of the mA and/or kV according to patient size and/or use of iterative  reconstruction technique. COMPARISON:  CT head 02/20/2021 BRAIN: BRAIN Cerebral ventricle sizes are concordant with the degree of cerebral volume loss. Patchy and confluent areas of decreased attenuation are noted throughout the deep and periventricular white matter of the cerebral hemispheres bilaterally, compatible with chronic microvascular ischemic disease. No evidence of large-territorial acute infarction. No parenchymal hemorrhage. No mass lesion. No extra-axial collection. No mass effect or midline shift. No hydrocephalus. Basilar cisterns are patent. Vascular: No hyperdense vessel. Atherosclerotic calcifications are present within the cavernous internal carotid arteries. Skull: No acute fracture or focal  lesion. Sinuses/Orbits: Paranasal sinuses and mastoid air cells are clear. The orbits are unremarkable. Other: None. IMPRESSION: No acute intracranial abnormality. Electronically Signed   By: Iven Finn M.D.   On: 09/18/2021 21:40   DG Chest Portable 1 View  Result Date: 09/18/2021 CLINICAL DATA:  Hypotension chest pain EXAM: PORTABLE CHEST 1 VIEW COMPARISON:  08/30/2021, 10/07/2015 FINDINGS: Cardiomegaly with vascular congestion and mild diffuse interstitial opacities suspicious for mild pulmonary edema. No pleural effusion or pneumothorax. Mild ground-glass opacity at the bases. Aortic atherosclerosis. IMPRESSION: 1. Cardiomegaly with vascular congestion and mild interstitial edema 2. Hazy lung base opacities could reflect atelectasis, edema or mild pneumonia Electronically Signed   By: Donavan Foil M.D.   On: 09/18/2021 17:50    EKG: Independently reviewed. Atrial fibrillation, rate 63, LAD.   Assessment/Plan   1. Acute on chronic HFmrEF  - Presents after a transient episode of bradycardia and hypotension with PCP, has CHF changes on CXR and peripheral edema, and was given 40 mg IV Lasix in ED  - BNP ordered in ED but never drawn  - EF was 45-50% on TTE earlier this month  - Continue  diuresis with 40 mg IV Lasix q12h, monitor wt and I/Os, monitor renal function and electrolytes    2. Elevated d-dimer  - D-dimer elevated to 2.27 in ED  - He has hx of DVT, reports adherence with Eliquis but there has been recent concern that he often forgets to take his medications that his son lays out for hime  - Check V/Q scan    3. Atrial fibrillation  - Continue Eliquis and diltiazem   4. CKD IV  - SCr is 3.04 on admission; appears to be at baseline  - Renally-dose medications, monitor while diuresing    5. Hypertension  - Was reported to be hypotensive with PCP but normotensive with EMS and hypertensive in ED  - Continue labetalol as tolerated   6. OSA  - BiPAP qHS   7. Type II DM  - Diet-controlled at home  - Monitor CBGs and treat if needed    8. Thrombocytopenia  - Platelets 113,000 in ED, was normal three wks ago  - No infectious s/s  - Repeat CBC in am   9. ?Weakness and confusion  - Pt seemed more weak and confused to PCP per report  - Unclear baseline, unable to reach his son on admission  - No acute findings on head CT, pt alert and oriented in ED with mild dysarthria  - Continue supportive care, clarify baseline    DVT prophylaxis: Eliquis  Code Status: Full, confirmed on admission  Level of Care: Level of care: Telemetry Cardiac Family Communication: Unable to reach son on admission   Disposition Plan:  Patient is from: Home  Anticipated d/c is to: TBD Anticipated d/c date is: 09/21/21  Patient currently: pending diuresis, V/Q scan, likely needs PT eval  Consults called: none  Admission status: Inpatient     Vianne Bulls, MD [facility]  09/19/2021, 3:56 AM

## 2021-09-19 NOTE — Progress Notes (Signed)
Heart Failure Stewardship Pharmacist Progress Note   PCP: Rogers Blocker, MD PCP-Cardiologist: None    HPI:  86 yo M with PMH of HF, afb, DVT, asthma/COPD, T2DM, CKD IV, OSA on CPAP, pulmonary HTN, GERD, prior sepsis from UTI, and HTN.  He was last admitted from 6/8-6/12 for acute CHF. He was noncompliant with diet and with taking medications. ECHO 6/9 with LVEF 45-50%, mildly reduced RV, and mild-moderate MR. Discharged on torsemide 50 mg daily (same as PTA dose).   He was scheduled for follow up in HF [facility] clinic on 6/23 but needed to cancel appt.  He returned to the ED on 6/27 with confusion, hypotension, and bradycardia. Also with LE edema. He is unsure if he was taking PTA torsemide. CXR with cardiomegaly, vascular congestion, and mild interstitial edema.  Current HF Medications: Diuretic: furosemide 40 mg IV q12h Other: Isordil 30 mg TID  Prior to admission HF Medications: Diuretic: torsemide 50 mg daily Other: Isordil 30 mg TID  Pertinent Lab Values: Serum creatinine 3.11, BUN 61, Potassium 4.0, Sodium 143, BNP 304.9, Magnesium 1.7, A1c 5.5 (02/20/21)   Vital Signs: Weight: not recorded Blood pressure: 130/60s  Heart rate: 60s  I/O: not recorded yesterday; net -1.4L  Medication Assistance / Insurance Benefits Check: Does the patient have prescription insurance?  Yes Type of insurance plan: [place]:  Prior to admission outpatient pharmacy: [facility] Is the patient willing to use [facility] pharmacy at discharge? Yes Is the patient willing to transition their outpatient pharmacy to utilize a [facility] outpatient pharmacy?   Pending    Assessment: 1. Acute on chronic diastolic CHF (LVEF 42-68%). NYHA class III symptoms. - Continue furosemide 40 mg IV q12h. Trend I/O. Collect daily weights. Keep K>4 and Mag>2. - No ACE/ARB/ARNI, MRA, or SGLT2i with advanced CKD - Continue Isordil 30 mg TID - Can consider adding hydralazine  for additional BP reduction if needed - hold off for now since he was hypotensive on admission. Likely requiring higher BP for renal perfusion.   Plan: 1) Medication changes recommended at this time: - Continue IV diuresis   2) Patient assistance: - None pending  3)  Education  - To be completed prior to discharge  Kerby Nora, PharmD, BCPS Heart Failure Stewardship Pharmacist Phone [phone]

## 2021-09-20 DIAGNOSIS — J449 Chronic obstructive pulmonary disease, unspecified: Secondary | ICD-10-CM

## 2021-09-20 DIAGNOSIS — N184 Chronic kidney disease, stage 4 (severe): Secondary | ICD-10-CM | POA: Diagnosis not present

## 2021-09-20 DIAGNOSIS — D696 Thrombocytopenia, unspecified: Secondary | ICD-10-CM

## 2021-09-20 DIAGNOSIS — Z789 Other specified health status: Secondary | ICD-10-CM

## 2021-09-20 DIAGNOSIS — R778 Other specified abnormalities of plasma proteins: Secondary | ICD-10-CM | POA: Diagnosis not present

## 2021-09-20 DIAGNOSIS — Z515 Encounter for palliative care: Secondary | ICD-10-CM

## 2021-09-20 DIAGNOSIS — I5033 Acute on chronic diastolic (congestive) heart failure: Secondary | ICD-10-CM | POA: Diagnosis not present

## 2021-09-20 DIAGNOSIS — Z7189 Other specified counseling: Secondary | ICD-10-CM

## 2021-09-20 LAB — BASIC METABOLIC PANEL
Anion gap: 9 (ref 5–15)
BUN: 61 mg/dL — ABNORMAL HIGH (ref 8–23)
CO2: 26 mmol/L (ref 22–32)
Calcium: 8.8 mg/dL — ABNORMAL LOW (ref 8.9–10.3)
Chloride: 105 mmol/L (ref 98–111)
Creatinine, Ser: 3.18 mg/dL — ABNORMAL HIGH (ref 0.61–1.24)
GFR, Estimated: 19 mL/min — ABNORMAL LOW (ref >60)
Glucose, Bld: 90 mg/dL (ref 70–99)
Potassium: 3.8 mmol/L (ref 3.5–5.1)
Sodium: 140 mmol/L (ref 135–145)

## 2021-09-20 LAB — GLUCOSE, CAPILLARY
Glucose-Capillary: 91 mg/dL (ref 70–99)
Glucose-Capillary: 148 mg/dL — ABNORMAL HIGH (ref 70–99)
Glucose-Capillary: 203 mg/dL — ABNORMAL HIGH (ref 70–99)
Glucose-Capillary: 119 mg/dL — ABNORMAL HIGH (ref 70–99)

## 2021-09-20 LAB — HEMOGLOBIN A1C
Hgb A1c MFr Bld: 5.4 % (ref 4.8–5.6)
Mean Plasma Glucose: 108 mg/dL

## 2021-09-20 LAB — CBC
HCT: 30.3 % — ABNORMAL LOW (ref 39.0–52.0)
Hemoglobin: 10 g/dL — ABNORMAL LOW (ref 13.0–17.0)
MCH: 31.5 pg (ref 26.0–34.0)
MCHC: 33 g/dL (ref 30.0–36.0)
MCV: 95.6 fL (ref 80.0–100.0)
Platelets: 116 10*3/uL — ABNORMAL LOW (ref 150–400)
RBC: 3.17 MIL/uL — ABNORMAL LOW (ref 4.22–5.81)
RDW: 14.4 % (ref 11.5–15.5)
WBC: 6 10*3/uL (ref 4.0–10.5)
nRBC: 0 % (ref 0.0–0.2)

## 2021-09-20 MED ORDER — SILDENAFIL CITRATE 20 MG PO TABS
20.0000 mg | ORAL_TABLET | Freq: Three times a day (TID) | ORAL | Status: DC
  Administered 2021-09-20 – 2021-09-22 (×5): 20 mg via ORAL
  Filled 2021-09-20 (×5): qty 1

## 2021-09-20 NOTE — Evaluation (Signed)
Occupational Therapy Evaluation Patient Details Name: Roy [name] MRN: [id number] DOB: November 06, 1935 Today's Date: 09/20/2021   History of Present Illness Pt is an 86 y/o male admitted 6/28 secondary to hypotension and weakness. Thought to be secondary to CHF. PMH includes afib on Eliquis, HF, DVT, asthma, COPD, DM, pulmonary HTN, GERD, HTN, BPH, CKD IV, OSA on CPAP.   Clinical Impression   Patient admitted for the diagnosis above.  PTA the patient is stating he has assist from family on a PRN basis, and generally walks with a RW, and performs sponge baths.  Currently he is presenting with significant declines compared to his stated baseline.  He is needing Mod to Max A for basic transfers, and Max A for lower body ADL.  Deficits are listed below, but OT recommends SNF level post acute rehab to maximize his functional status.  OT will continue efforts in the acute setting.        Recommendations for follow up therapy are one component of a multi-disciplinary discharge planning process, led by the attending physician.  Recommendations may be updated based on patient status, additional functional criteria and insurance authorization.   Follow Up Recommendations  Skilled nursing-short term rehab (<3 hours/day)    Assistance Recommended at Discharge Frequent or constant Supervision/Assistance  Patient can return home with the following A lot of help with walking and/or transfers;A lot of help with bathing/dressing/bathroom;Assistance with cooking/housework;Help with stairs or ramp for entrance;Assist for transportation;Direct supervision/assist for financial management;Direct supervision/assist for medications management    Functional Status Assessment  Patient has had a recent decline in their functional status and demonstrates the ability to make significant improvements in function in a reasonable and predictable amount of time.  Equipment Recommendations  None recommended by OT     Recommendations for Other Services       Precautions / Restrictions Precautions Precautions: Fall Restrictions Weight Bearing Restrictions: No      Mobility Bed Mobility Overal bed mobility: Needs Assistance Bed Mobility: Supine to Sit, Sit to Supine     Supine to sit: Mod assist, HOB elevated Sit to supine: Max assist        Transfers Overall transfer level: Needs assistance   Transfers: Sit to/from Stand Sit to Stand: Mod assist, From elevated surface                  Balance Overall balance assessment: Needs assistance Sitting-balance support: No upper extremity supported Sitting balance-Leahy Scale: Fair     Standing balance support: Bilateral upper extremity supported Standing balance-Leahy Scale: Poor                             ADL either performed or assessed with clinical judgement   ADL   Eating/Feeding: Set up;Bed level   Grooming: Wash/dry hands;Wash/dry face;Set up;Sitting   Upper Body Bathing: Sitting;Minimal assistance   Lower Body Bathing: Maximal assistance;Sit to/from stand   Upper Body Dressing : Min guard;Sitting   Lower Body Dressing: Maximal assistance;Sit to/from stand   Toilet Transfer: Moderate assistance;BSC/3in1;Stand-pivot   Toileting- Clothing Manipulation and Hygiene: Maximal assistance;Sit to/from stand         General ADL Comments: patient unable to let go of the RW to static stand.     Vision Baseline Vision/History: 1 Wears glasses Patient Visual Report: No change from baseline       Perception Perception Perception: Not tested   Praxis Praxis Praxis: Not tested  Pertinent Vitals/Pain Pain Assessment Pain Assessment: Faces Faces Pain Scale: Hurts little more Pain Location: R ankle Pain Descriptors / Indicators: Tender, Grimacing Pain Intervention(s): Monitored during session     Hand Dominance Right   Extremity/Trunk Assessment Upper Extremity Assessment Upper Extremity  Assessment: Generalized weakness;RUE deficits/detail;LUE deficits/detail RUE Deficits / Details: decreased end range shoulder flexion LUE Deficits / Details: decreased end range shoulder flexion   Lower Extremity Assessment Lower Extremity Assessment: Defer to PT evaluation   Cervical / Trunk Assessment Cervical / Trunk Assessment: Kyphotic   Communication Communication Communication: No difficulties   Cognition Arousal/Alertness: Awake/alert Behavior During Therapy: WFL for tasks assessed/performed Overall Cognitive Status: No family/caregiver present to determine baseline cognitive functioning                   Orientation Level: Situation   Memory: Decreased short-term memory Following Commands: Follows one step commands with increased time, Follows multi-step commands inconsistently     Problem Solving: Slow processing, Decreased initiation, Requires verbal cues General Comments: patient admits to not being able to remember "things"     General Comments   VSS on RA    Exercises     Shoulder Instructions      Home Living Family/patient expects to be discharged to:: Private residence Living Arrangements: Alone Available Help at Discharge: Family;Available PRN/intermittently Type of Home: House Home Access: Stairs to enter [employer] of Steps: 1 Entrance Stairs-Rails: None Home Layout: One level     Bathroom Shower/Tub: Tub/shower unit;Walk-in shower;Sponge bathes at baseline   Bathroom Toilet: Handicapped height Bathroom Accessibility: Yes How Accessible: Accessible via walker Home Equipment: [place] - single point;Rolling Walker (2 wheels);Wheelchair - manual;Shower seat          Prior Functioning/Environment Prior Level of Function : Independent/Modified Independent               ADLs Comments: Son checks on patient, assists with transportation, meals, med management.  States he takes sponge bath and typically eats microwavable  meals.        OT Problem List: Decreased strength;Decreased activity tolerance;Impaired balance (sitting and/or standing);Decreased cognition;Decreased safety awareness;Decreased knowledge of use of DME or AE;Cardiopulmonary status limiting activity;Decreased range of motion;Pain      OT Treatment/Interventions: Self-care/ADL training;Therapeutic exercise;Energy conservation;DME and/or AE instruction;Therapeutic activities;Patient/family education;Balance training    OT Goals(Current goals can be found in the care plan section) Acute Rehab OT Goals Patient Stated Goal: None stated OT Goal Formulation: With patient Time For Goal Achievement: 10/04/21 Potential to Achieve Goals: Good  OT Frequency: Min 2X/week    Co-evaluation              AM-PAC OT "6 Clicks" Daily Activity     Outcome Measure Help from another person eating meals?: None Help from another person taking care of personal grooming?: None Help from another person toileting, which includes using toliet, bedpan, or urinal?: A Lot Help from another person bathing (including washing, rinsing, drying)?: A Lot Help from another person to put on and taking off regular upper body clothing?: A Lot Help from another person to put on and taking off regular lower body clothing?: A Lot 6 Click Score: 16   End of Session Equipment Utilized During Treatment: Rolling walker (2 wheels) Nurse Communication: Mobility status  Activity Tolerance: Patient limited by fatigue Patient left: in bed;with call bell/phone within reach;with bed alarm set  OT Visit Diagnosis: Unsteadiness on feet (R26.81);Other abnormalities of gait and mobility (R26.89);Muscle weakness (generalized) (M62.81);Pain Pain -  Right/Left: Right Pain - part of body: Leg                Time: 1916-6060 OT Time Calculation (min): 19 min Charges:  OT General Charges $OT Visit: 1 Visit OT Evaluation $OT Eval Moderate Complexity: 1 Mod  09/20/2021  RP,  OTR/L  Acute Rehabilitation Services  Office:  [phone]   Metta Clines 09/20/2021, 9:14 AM

## 2021-09-20 NOTE — Progress Notes (Signed)
PROGRESS NOTE    Roy [name]  UKG:[id number] DOB: 12/16/1935 DOA: 09/18/2021 PCP: Rogers Blocker, MD    Brief Narrative:  86 year old gentleman with history of cognitive dysfunction, chronic atrial fibrillation on Eliquis, CKD stage IV, history of DVT, sleep apnea, hypertension lives alone at home brought to ER for evaluation of shortness of breath, low blood pressures and low heart rate and more confusion found when he went to PCP office.  Patient lives alone.  His son helps him to go to doctors.  He took him to routine checkup yesterday, where he was found with blood pressure 96 and heart rate in 40s and patient looked confused and weak so sent to ER for work-up.  In the emergency room he was given 250 mL of saline before arrival to ER and he was hemodynamically stable since then. Patient was on room air.  EKG with A-fib with heart rate 63.  CT head was normal.  BUN and creatinine 63/3.04 at about his levels.  Spurious platelet counts that ultimately showed adequate.  Lactic acid normal.  D-dimer 2.2.  Blood cultures were collected, he was given 1 dose of IV Lasix.  Admitted to hospital due to significant symptoms.   Assessment & Plan  Acute on chronic combined heart failure, leg edema, unexplained shortness of breath: Suspect heart failure exacerbation.  Patient has difficulty compliance with medications at home. Presented with significant bilateral leg swelling.  Currently remains on IV Lasix 40 mg twice daily and diuresing well.  Continue to monitor intake and output monitoring.  Closely monitor renal functions.  With the patient's cognitive decline, it will be difficult to maintain his cardiac status, he will need more supervised living.  See discussion below.  Elevated D-dimer, history of DVT and noncompliance to Eliquis: The patient is on renally dosed Eliquis, angiogram is not possible.  A VQ scan was done that showed low probability of PE. No clinical evidence of tachycardia or  dyspnea.  Chronic A-fib, reported bradycardia: Rate controlled.  Therapeutic on Eliquis.  His heart rate has remained less than 55 at times.   Reported heart rate of 40 at PCP office.  We will discontinue labetalol.  Continue Cardizem today and monitor on the telemetry.  I discussed with his cardiologist about this.    Obstructive sleep apnea: Using CPAP at night.  Unsure if he uses it at home.  Weakness and confusion: Deconditioned, cognitive decline.  No acute neurological findings of focal deficits. CT head was essentially normal. Fall precautions.  Delirium precautions.  PT/OT. Palliative care consultation, coordination of care he will probably need skilled nursing rehab as well assisted living facility level of care after discharge.   Type 2 diabetes: Well-controlled.  On diet controlled at home.  No indication to treat at this time.  CKD stage IV: Creatinine elevated today from baseline.  High risk of decompensation.  Continue monitoring closely.  We will try diuresis today and recheck tomorrow.  Case discussed with cardiology.  DVT prophylaxis: apixaban (ELIQUIS) tablet 2.5 mg Start: 09/19/21 1000 apixaban (ELIQUIS) tablet 2.5 mg   Code Status: Full code, will consult palliative care.  Will need goals of care discussions. Family Communication: Son on the phone called and updated. Disposition Plan: Status is: Inpatient Remains inpatient appropriate because: IV diuresis, significant debility.  Unsafe discharge.     Consultants:  Cardiology Palliative  Procedures:  None  Antimicrobials:  None   Subjective:  Patient seen and examined.  No overnight events.  He tells  me that he is doing fine in the room alone.  He thinks he is at home. Urine output reported 1600 mL last 24 hours.  Leg swelling is slightly improved. Telemetry shows occasional heart rate less than 55.  Blood pressures been pretty stable.  Objective: Vitals:   09/20/21 0106 09/20/21 0729 09/20/21 0850  09/20/21 1027  BP: 129/68 (!) 152/84  (!) 123/58  Pulse: 64 86 85 63  Resp: 15 19 (!) 21 17  Temp: 98.2 F (36.8 C) 97.6 F (36.4 C)  98.4 F (36.9 C)  TempSrc: Oral Oral  Oral  SpO2: 98% 100% 100% 95%  Weight: 84.2 kg       Intake/Output Summary (Last 24 hours) at 09/20/2021 1039 Last data filed at 09/20/2021 1028 Gross per 24 hour  Intake 783 ml  Output 1675 ml  Net -892 ml   Filed Weights   09/20/21 0106  Weight: 84.2 kg    Examination:  General exam: Appears calm and comfortable  Flat affect.  Oriented to self.  Not oriented to situation and place. Respiratory system: Mostly clear.  No added sounds. Cardiovascular system: S1 & S2 heard, irregularly irregular.  2+ bilateral leg edema, slightly improved than yesterday. Gastrointestinal system: Abdomen is nondistended, soft and nontender. No organomegaly or masses felt. Normal bowel sounds heard    Data Reviewed: I have personally reviewed following labs and imaging studies  CBC: Recent Labs  Lab 09/18/21 1805 09/19/21 0448 09/20/21 0625  WBC 4.4 4.8 6.0  NEUTROABS 3.3  --   --   HGB 10.1* 9.9* 10.0*  HCT 31.0* 30.5* 30.3*  MCV 98.4 97.4 95.6  PLT 113* 111* 672*   Basic Metabolic Panel: Recent Labs  Lab 09/18/21 1805 09/19/21 0448 09/20/21 0625  NA 139 143 140  K 4.1 4.0 3.8  CL 107 106 105  CO2 '22 25 26  '$ GLUCOSE 93 78 90  BUN 63* 61* 61*  CREATININE 3.04* 3.11* 3.18*  CALCIUM 8.7* 9.0 8.8*  MG  --  1.7  --    GFR: Estimated Creatinine Clearance: 17.9 mL/min (A) (by C-G formula based on SCr of 3.18 mg/dL (H)). Liver Function Tests: Recent Labs  Lab 09/18/21 1805  AST 20  ALT 26  ALKPHOS 110  BILITOT 1.1  PROT 5.7*  ALBUMIN 3.0*   No results for input(s): "LIPASE", "AMYLASE" in the last 168 hours. No results for input(s): "AMMONIA" in the last 168 hours. Coagulation Profile: No results for input(s): "INR", "PROTIME" in the last 168 hours. Cardiac Enzymes: No results for input(s):  "CKTOTAL", "CKMB", "CKMBINDEX", "TROPONINI" in the last 168 hours. BNP (last 3 results) No results for input(s): "PROBNP" in the last 8760 hours. HbA1C: Recent Labs    09/19/21 0448  HGBA1C 5.4   CBG: Recent Labs  Lab 09/19/21 0820 09/19/21 1308 09/19/21 1547 09/19/21 2112 09/20/21 0608  GLUCAP 76 88 135* 160* 91   Lipid Profile: No results for input(s): "CHOL", "HDL", "LDLCALC", "TRIG", "CHOLHDL", "LDLDIRECT" in the last 72 hours. Thyroid Function Tests: No results for input(s): "TSH", "T4TOTAL", "FREET4", "T3FREE", "THYROIDAB" in the last 72 hours. Anemia Panel: No results for input(s): "VITAMINB12", "FOLATE", "FERRITIN", "TIBC", "IRON", "RETICCTPCT" in the last 72 hours. Sepsis Labs: Recent Labs  Lab 09/18/21 1805 09/18/21 2000  LATICACIDVEN 1.2 1.3    Recent Results (from the past 240 hour(s))  Culture, blood (routine x 2)     Status: None (Preliminary result)   Collection Time: 09/18/21 12:05 PM   Specimen: BLOOD  Result Value Ref Range Status   Specimen Description BLOOD RIGHT UPPER ARM  Final   Special Requests   Final    BOTTLES DRAWN AEROBIC AND ANAEROBIC Blood Culture adequate volume   Culture   Final    NO GROWTH 2 DAYS Performed at [facility], [address] [place], [place] [address]    Report Status PENDING  Incomplete  Culture, blood (routine x 2)     Status: None (Preliminary result)   Collection Time: 09/18/21  5:57 PM   Specimen: BLOOD  Result Value Ref Range Status   Specimen Description BLOOD BLOOD RIGHT FOREARM  Final   Special Requests   Final    BOTTLES DRAWN AEROBIC AND ANAEROBIC Blood Culture adequate volume   Culture   Final    NO GROWTH 2 DAYS Performed at [facility], [place] [address] [place], [place] [address]    Report Status PENDING  Incomplete  Urine Culture     Status: None   Collection Time: 09/19/21 12:36 AM   Specimen: Urine, Clean Catch  Result Value Ref Range Status   Specimen Description URINE, CLEAN  CATCH  Final   Special Requests NONE  Final   Culture   Final    NO GROWTH Performed at [facility], [place] [address] [place],  [address]    Report Status 09/19/2021 FINAL  Final         Radiology Studies: NM Pulmonary Perfusion  Result Date: 09/19/2021 CLINICAL DATA:  Positive D-dimer, clinical suspicion of pulmonary embolism EXAM: NUCLEAR MEDICINE PERFUSION LUNG SCAN TECHNIQUE: Perfusion images were obtained in multiple projections after intravenous injection of radiopharmaceutical. Ventilation scans intentionally deferred if perfusion scan and chest x-ray adequate for interpretation during COVID 19 epidemic. RADIOPHARMACEUTICALS:  4.1 mCi Tc-35mMAA IV COMPARISON:  Chest radiograph done on 09/19/2018 FINDINGS: There are no wedge-shaped perfusion defects. There is subtle decrease in activity in the posterior mid and lower lung fields. IMPRESSION: Imaging findings suggest low probability for pulmonary embolism. Electronically Signed   By: PElmer PickerM.D.   On: 09/19/2021 12:53   CT HEAD WO CONTRAST (5MM)  Result Date: 09/18/2021 CLINICAL DATA:  Mental status change, unknown cause EXAM: CT HEAD WITHOUT CONTRAST TECHNIQUE: Contiguous axial images were obtained from the base of the skull through the vertex without intravenous contrast. RADIATION DOSE REDUCTION: This exam was performed according to the departmental dose-optimization program which includes automated exposure control, adjustment of the mA and/or kV according to patient size and/or use of iterative reconstruction technique. COMPARISON:  CT head 02/20/2021 BRAIN: BRAIN Cerebral ventricle sizes are concordant with the degree of cerebral volume loss. Patchy and confluent areas of decreased attenuation are noted throughout the deep and periventricular white matter of the cerebral hemispheres bilaterally, compatible with chronic microvascular ischemic disease. No evidence of large-territorial acute infarction. No  parenchymal hemorrhage. No mass lesion. No extra-axial collection. No mass effect or midline shift. No hydrocephalus. Basilar cisterns are patent. Vascular: No hyperdense vessel. Atherosclerotic calcifications are present within the cavernous internal carotid arteries. Skull: No acute fracture or focal lesion. Sinuses/Orbits: Paranasal sinuses and mastoid air cells are clear. The orbits are unremarkable. Other: None. IMPRESSION: No acute intracranial abnormality. Electronically Signed   By: MIven FinnM.D.   On: 09/18/2021 21:40   DG Chest Portable 1 View  Result Date: 09/18/2021 CLINICAL DATA:  Hypotension chest pain EXAM: PORTABLE CHEST 1 VIEW COMPARISON:  08/30/2021, 10/07/2015 FINDINGS: Cardiomegaly with vascular congestion and mild  diffuse interstitial opacities suspicious for mild pulmonary edema. No pleural effusion or pneumothorax. Mild ground-glass opacity at the bases. Aortic atherosclerosis. IMPRESSION: 1. Cardiomegaly with vascular congestion and mild interstitial edema 2. Hazy lung base opacities could reflect atelectasis, edema or mild pneumonia Electronically Signed   By: Donavan Foil M.D.   On: 09/18/2021 17:50        Scheduled Meds:  apixaban  2.5 mg Oral BID   cinacalcet  30 mg Oral Q breakfast   diltiazem  240 mg Oral Daily   finasteride  5 mg Oral Daily   fluticasone furoate-vilanterol  1 puff Inhalation Daily   And   umeclidinium bromide  1 puff Inhalation Daily   furosemide  40 mg Intravenous Q12H   insulin aspart  0-6 Units Subcutaneous TID WC   isosorbide dinitrate  30 mg Oral TID   pantoprazole  40 mg Oral Daily   sodium chloride flush  3 mL Intravenous Q12H   tamsulosin  0.4 mg Oral Daily   Continuous Infusions:   LOS: 2 days    Time spent: 35 minutes    Barb Merino, MD [facility] Pager [phone]

## 2021-09-20 NOTE — Progress Notes (Signed)
Heart Failure Stewardship Pharmacist Progress Note   PCP: Rogers Blocker, MD PCP-Cardiologist: None    HPI:  86 yo M with PMH of HF, afb, DVT, asthma/COPD, T2DM, CKD IV, OSA on CPAP, pulmonary HTN, GERD, prior sepsis from UTI, and HTN.  He was last admitted from 6/8-6/12 for acute CHF. He was noncompliant with diet and with taking medications. ECHO 6/9 with LVEF 45-50%, mildly reduced RV, and mild-moderate MR. Discharged on torsemide 50 mg daily (same as PTA dose).   He was scheduled for follow up in HF [facility] clinic on 6/23 but needed to cancel appt.  He returned to the ED on 6/27 with confusion, hypotension, and bradycardia. Also with LE edema. He is unsure if he was taking PTA torsemide. CXR with cardiomegaly, vascular congestion, and mild interstitial edema.  Current HF Medications: Diuretic: furosemide 40 mg IV q12h Other: Isordil 30 mg TID  Prior to admission HF Medications: Diuretic: torsemide 50 mg daily Other: Isordil 30 mg TID  Pertinent Lab Values: Serum creatinine 3.18, BUN 61, Potassium 3.8, Sodium 140, BNP 304.9, Magnesium 1.7, A1c 5.5 (02/20/21)   Vital Signs: Weight: 185 lbs (discharge weight on 6/12 189 lbs) Blood pressure: 130/80s  Heart rate: 60s  I/O: -1.7L yesterday; net -3.1L  Medication Assistance / Insurance Benefits Check: Does the patient have prescription insurance?  Yes Type of insurance plan: [place]:  Prior to admission outpatient pharmacy: [facility] Is the patient willing to use [facility] pharmacy at discharge? Yes Is the patient willing to transition their outpatient pharmacy to utilize a [facility] outpatient pharmacy?   Pending    Assessment: 1. Acute on chronic diastolic CHF (LVEF 62-03%). NYHA class III symptoms. - Continue furosemide 40 mg IV q12h. Modest urine output. May need higher doses if output does not pick up. Collect daily weights. Keep K>4 and Mag>2. - No ACE/ARB/ARNI, MRA, or SGLT2i  with advanced CKD - Continue Isordil 30 mg TID - Can consider adding hydralazine for additional BP reduction if needed - hold off for now since he was hypotensive on admission. Likely requiring higher BP for renal perfusion.   Plan: 1) Medication changes recommended at this time: - Continue IV diuresis - may require higher doses  2) Patient assistance: - None pending - Possible SNF at discharge  3)  Education  - To be completed prior to discharge  Kerby Nora, PharmD, BCPS Heart Failure Stewardship Pharmacist Phone [phone]

## 2021-09-20 NOTE — Progress Notes (Signed)
Pt not using CPAP during his hospital stay.

## 2021-09-20 NOTE — TOC Initial Note (Signed)
Transition of Care [facility]) - Initial/Assessment Note    Patient Details  Name: Roy [name] MRN: [id number] Date of Birth: 1936-03-08  Transition of Care [facility]) CM/SW Contact:    Bethann Berkshire, [place] Phone Number: 09/20/2021, 10:29 AM  Clinical Narrative:                  CSW spoke with pt's son who requests CSW speak with son's wife, Margretta Sidle, as she is an Therapist, sports. CSW spoke with Varnita and explained SNF recommendations. Margretta Sidle explains that family was hoping pt could get STR at [facility]. Pt lives home alone in a single story townhouse. He has been to [facility] in Dec 2022. Margretta Sidle explains that pt preferred [facility] and has a sister who lives near by to the facility. CSW will complete fl2 and fax bed requests in hub.   Expected Discharge Plan: Skilled Nursing Facility Barriers to Discharge: Continued Medical Work up, Ship broker   Patient Goals and CMS Choice        Expected Discharge Plan and Services Expected Discharge Plan: [place] arrangements for the past 2 months: [place]                                      Prior Living Arrangements/Services Living arrangements for the past 2 months: Single Family Home Lives with:: Self Patient language and need for interpreter reviewed:: Yes        Need for Family Participation in Patient Care: Yes (Comment) Care giver support system in place?: Yes (comment)   Criminal Activity/Legal Involvement Pertinent to Current Situation/Hospitalization: No - Comment as needed  Activities of Daily Living      Permission Sought/Granted                  Emotional Assessment              Admission diagnosis:  Acute on chronic diastolic CHF (congestive heart failure) (HCC) [I50.33] Acute on chronic congestive heart failure, unspecified heart failure type ([place]) [I50.9] Patient Active Problem List   Diagnosis Date Noted   Positive D dimer 09/19/2021    Thrombocytopenia ([place]) 09/19/2021   Acute on chronic diastolic CHF (congestive heart failure) ([place]) 09/18/2021   Acute on chronic diastolic heart failure ([place]) 08/30/2021   Weakness 08/30/2021   CHF exacerbation ([place]) 08/30/2021   Elevated troponin 08/30/2021   Palliative care encounter 07/28/2021   Needs assistance with community resources 07/10/2021   Syncope and collapse 02/20/2021   Elevated LFTs 02/20/2021   Leukocytosis 02/20/2021   Chronic diastolic CHF (congestive heart failure) ([place]) 02/20/2021   Pulmonary hypertension, unspecified ([place]) 12/17/2019   Persistent atrial fibrillation ([place]) 12/17/2019   Leg edema 11/18/2019   Exertional dyspnea 11/18/2019   CKD (chronic kidney disease), stage IV ([place]) 10/06/2015   Benign prostatic hyperplasia with urinary obstruction 07/24/2015   DM2 (diabetes mellitus, type 2) ([place]) 01/06/2007   EXOGENOUS OBESITY 01/06/2007   OSA on CPAP 01/06/2007   Uncontrolled hypertension 01/06/2007   Chronic obstructive airway disease with asthma ([place]) 01/06/2007   PCP:  Rogers Blocker, MD Pharmacy:   [facility] [address], [place] [address] Phone: [phone] Fax: [phone]  [facility] [address] [place] [address] Phone: [phone] Fax: [phone]  Social Determinants of Health (SDOH) Interventions    Readmission Risk Interventions     No data to display           

## 2021-09-20 NOTE — Consult Note (Signed)
Consultation Note Date: 09/20/2021   Patient Name: Roy [name]  DOB: 1936-01-19  MRN: [id number]  Age / Sex: 86 y.o., male  PCP: Rogers Blocker, MD Referring Physician: Barb Merino, MD  Reason for Consultation: Establishing goals of care, "Goal of care, care coordination"  HPI/Patient Profile: 86 y.o. male  with past medical history of cognitive dysfunction, chronic atrial fibrillation on Eliquis, CKD stage IV, history of DVT, sleep apnea, hypertension, COPD, prostate cancer, HFmrEF, OSA on Bipap presented to ED on 09/18/21 after PCP evaluation where concerned patient was more weak and confused from baseline - found to be hypotensive and bradycardic. On arrival to ED patient was afebrile and saturating well on room air with normal heart rate and stable blood pressure. Head CT was negative for acute intracranial abnormality.  Chest x-ray features cardiomegaly, vascular congestion, and mild interstitial edema. Patient was admitted on 09/18/2021 with acute on chronic HRmrEF, elevated d-dimer, thrombocytopenia, weakness and confusion.  Of note, patient was recently hospitalized from 6/8-6/12/23 for CHF exacerbation. He is also followed by [facility] outpatient Palliative Care.    Clinical Assessment and Goals of Care: I have reviewed medical records including EPIC notes, labs, and imaging. Received report from primary RN - no acute concerns. Per RN, patient is forgetful with intermittent confusion, weak and unable to get up without assistance. RN reports patient has a good appetite.   Went to visit patient at bedside - no family/visitors present. Patient was lying in bed awake, alert, oriented to self and place, and able to participate in conversation. No signs or non-verbal gestures of pain or discomfort noted. No respiratory distress, increased work of breathing, or secretions noted. Patient denies pain. He is feeding  himself dinner during my visit. He reports feeling "good."  Met with patient's son/Roy [name] via phone  to discuss diagnosis, prognosis, GOC, EOL wishes, disposition, and options.  I introduced Palliative Medicine as specialized medical care for people living with serious illness. It focuses on providing relief from the symptoms and stress of a serious illness. The goal is to improve quality of life for both the patient and the family.  We discussed a brief life review of the patient as well as functional and nutritional status. Patient has three sons - two live out of state. Prior to hospitalization, patient lived in a private residence alone with his son checking on him often. Roy [name] assisted patient will pill box; however, due to patient's forgetfulness, would miss doses of medication. Roy [name] tells me, "when dad takes his meds properly there are no issues." Roy [name] can tell when he misses medication doses by pills left in pill box. Roy [name] and his brothers understand patient will need additional assistance after discharge - he is very interested in discussing options and GOC. He is hopeful PMT can meet with him, his wife, and brothers (if available). Family meeting scheduled for tomorrow 6/30 Roy [name] to call PMT with time.    Questions and concerns were addressed. The patient/family was encouraged to call with questions and/or concerns. PMT number was provided.   Primary Decision Maker: NEXT OF KIN    SUMMARY OF RECOMMENDATIONS   Continue full code/full scope for now Family meeting scheduled for tomorrow 6/30 for full GOC - son to call PMT with time PMT will continue to follow and support holistically   Code Status/Advance Care Planning: Full code  Palliative Prophylaxis:  Aspiration, Bowel Regimen, Delirium Protocol, Frequent Pain Assessment, Oral Care, and Turn Reposition  Additional Recommendations (Limitations, Scope, Preferences):  Full Scope Treatment  Psycho-social/Spiritual:  Desire  for further Chaplaincy support:no Created space and opportunity for patient and family to express thoughts and feelings regarding patient's current medical situation.  Emotional support and therapeutic listening provided.  Prognosis:  Poor in the setting of advanced age, cognitive dysfunction, recurrent hospitalizations for CHF, and multiple comorbidities  Discharge Planning: To Be Determined      Primary Diagnoses: Present on Admission:  Acute on chronic diastolic CHF (congestive heart failure) (HCC)  Persistent atrial fibrillation (HCC)  Elevated troponin  Chronic obstructive airway disease with asthma (HCC)  Uncontrolled hypertension  CKD (chronic kidney disease), stage IV (HCC)  Positive D dimer  Thrombocytopenia ([place])   I have reviewed the medical record, interviewed the patient and family, and examined the patient. The following aspects are pertinent.  Past Medical History:  Diagnosis Date   A-fib [name])    Abscess of external ear, left 01/11/2016   Acute deep vein thrombosis (DVT) of femoral vein of left lower extremity (HCC)    VQ scan negative/low probability   Arthritis HANDS/ THUMBS/ SHOULDERS   Bacteremia due to Enterococcus 10/09/2015   Bladder calculi    Chronic obstructive asthma, unspecified FOLLOWED BY DR Tarri Fuller YOUNG  VISIT 10-14-2011 IN EPIC   COPD (chronic obstructive pulmonary disease) (HCC)    Cyst on ear    left   Diabetes mellitus    DVT (deep venous thrombosis) (HCC)    Dyspnea on exertion    GERD (gastroesophageal reflux disease)    Hematuria    History of prostate cancer 2010-- S/P  EXTERNAL RADIATION   Hydronephrosis, left    Hypertension    Infected cyst of skin 01/25/2016   OSA on CPAP    wears CPAP   Sepsis ([place])    Sepsis secondary to UTI ([place]) 10/06/2015   UTI (lower urinary tract infection) 10/06/2015   Wears glasses    Social History   Socioeconomic History   Marital status: Widowed    Spouse name: Not on file   Number of  children: 3   Years of education: Not on file   Highest education level: Associate degree: occupational, Hotel manager, or vocational program  Occupational History   Occupation: Retired    Comment: formerly Production assistant, radio  Tobacco Use   Smoking status: Former    Packs/day: 0.50    Years: 25.00    Total pack years: 12.50    Types: Cigarettes    Quit date: 03/25/1998    Years since quitting: 23.5   Smokeless tobacco: Never  Vaping Use   Vaping Use: Never used  Substance and Sexual Activity   Alcohol use: Not Currently   Drug use: No   Sexual activity: Not on file  Other Topics Concern   Not on file  Social History Narrative   Not on file   Social Determinants of Health   Financial Resource Strain: Low Risk  (09/03/2021)   Overall Financial Resource Strain (CARDIA)    Difficulty of Paying Living Expenses: Not very hard  Food Insecurity: No Food Insecurity (09/03/2021)   Hunger Vital Sign    Worried About Running Out of Food in the Last Year: Never true    [name] in the Last Year: Never true  Transportation Needs: No Transportation Needs (09/03/2021)   PRAPARE - Hydrologist (Medical): No    Lack of Transportation (Non-Medical): No  Physical Activity: Not on file  Stress: Not on file  Social Connections: Not  on file   Family History  Problem Relation Age of Onset   Diabetes Father    Scheduled Meds:  apixaban  2.5 mg Oral BID   cinacalcet  30 mg Oral Q breakfast   diltiazem  240 mg Oral Daily   finasteride  5 mg Oral Daily   fluticasone furoate-vilanterol  1 puff Inhalation Daily   And   umeclidinium bromide  1 puff Inhalation Daily   furosemide  40 mg Intravenous Q12H   insulin aspart  0-6 Units Subcutaneous TID WC   isosorbide dinitrate  30 mg Oral TID   pantoprazole  40 mg Oral Daily   sildenafil  20 mg Oral TID   sodium chloride flush  3 mL Intravenous Q12H   tamsulosin  0.4 mg Oral Daily   Continuous Infusions: PRN  Meds:.acetaminophen **OR** acetaminophen Medications Prior to Admission:  Prior to Admission medications   Medication Sig Start Date End Date Taking? Authorizing Provider  cinacalcet (SENSIPAR) 30 MG tablet Take 30 mg by mouth daily with supper. 06/06/19  Yes [provider]  diltiazem (CARDIZEM CD) 240 MG 24 hr capsule Take 240 mg by mouth daily.   Yes [provider]  ELIQUIS 2.5 MG TABS tablet Take 2.5 mg by mouth 2 (two) times daily. 04/14/20  Yes [provider]  finasteride (PROSCAR) 5 MG tablet Take 5 mg by mouth daily.   Yes [provider]  gabapentin (NEURONTIN) 100 MG capsule Take 100 mg by mouth at bedtime.   Yes [provider]  isosorbide dinitrate (ISORDIL) 30 MG tablet TAKE 1 TABLET BY MOUTH THREE TIMES DAILY Patient taking differently: Take 30 mg by mouth 3 (three) times daily. 09/11/21  Yes Patwardhan, Manish J, MD  labetalol (NORMODYNE) 100 MG tablet Take 1 tablet (100 mg total) by mouth 2 (two) times daily. 06/21/20  Yes Patwardhan, Manish J, MD  pantoprazole (PROTONIX) 40 MG tablet Take 40 mg by mouth daily.   Yes [provider]  potassium chloride SA (KLOR-CON M) 20 MEQ tablet Take 20 mEq by mouth daily.   Yes [provider]  tamsulosin (FLOMAX) 0.4 MG CAPS capsule Take 1 capsule (0.4 mg total) by mouth daily. Patient taking differently: Take 0.4 mg by mouth in the morning and at bedtime. 10/18/15  Yes Dana Allan I, MD  torsemide (DEMADEX) 100 MG tablet Take 0.5 tablets (50 mg total) by mouth daily. 02/24/21  Yes Regalado, Cassie Freer, MD  CONTOUR NEXT TEST test strip daily. 12/10/19   [provider]  Fluticasone-Umeclidin-Vilant (TRELEGY ELLIPTA) 100-62.5-25 MCG/INH AEPB Inhale 1 puff into the lungs daily. Patient not taking: Reported on 09/19/2021 02/29/20   Deneise Lever, MD  Microlet Lancets MISC  12/10/19   [provider]   No Known Allergies Review of Systems  Unable to perform ROS:  Mental status change    Physical Exam Vitals and nursing note reviewed.  Constitutional:      General: He is not in acute distress. Pulmonary:     Effort: No respiratory distress.  Skin:    General: Skin is warm and dry.  Neurological:     Mental Status: He is alert. He is confused.     Motor: Weakness present.  Psychiatric:        Attention and Perception: Attention normal.        Behavior: Behavior is cooperative.        Cognition and Memory: Cognition is impaired. Memory is impaired.     Vital Signs:  BP 133/68 (BP Location: Right Arm)   Pulse 64   Temp 98.4 F (36.9 C) (Oral)   Resp 20   Wt 84.2 kg   SpO2 99%   BMI 26.63 kg/m  Pain Scale: 0-10   Pain Score: 0-No pain   SpO2: SpO2: 99 % O2 Device:SpO2: 99 % O2 Flow Rate: .   IO: Intake/output summary:  Intake/Output Summary (Last 24 hours) at 09/20/2021 1732 Last data filed at 09/20/2021 1449 Gross per 24 hour  Intake 960 ml  Output 1925 ml  Net -965 ml    LBM: Last BM Date : 09/19/21 Baseline Weight: Weight: 84.2 kg Most recent weight: Weight: 84.2 kg     Palliative Assessment/Data: PPS 50%     Time In: 1630 Time Out: 1735 Time Total: 65 minutes  Greater than 50%  of this time was spent counseling and coordinating care related to the above assessment and plan.  Signed by: Lin Landsman, NP   Please contact Palliative Medicine Team phone at [phone] for questions and concerns.  For individual provider: See Amion  *Portions of this note are a verbal dictation therefore any spelling and/or grammatical errors are due to the "[employer]" system interpretation.

## 2021-09-20 NOTE — Consult Note (Addendum)
CARDIOLOGY CONSULT NOTE  Patient ID: Roy [name] MRN: [id number] DOB/AGE: 84/08/1935 84 y.o.  Admit date: 09/18/2021 Referring Physician: [facility] hospital[facility] Reason for Consultation:  Heart failure  HPI:   86 y.o. African American male with hypertension, type 2 DM, persistent Afib, OSA, CKD IV, acute on chronic HFpEF, pulmonary hypertension  Patient has been in and out of hospital throughout this month.  This is a second hospitalization.  He was admitted 2 days ago, but patient tells me that he has been in the hospital for 2 months.  He denies any complaints at this time and says suggest that his breathing is comfortable.  He denies any chest pain.  He does endorse bilateral leg edema.  Additional information also obtained from his son over the phone.  I personally reviewed labs as well as echocardiogram through his hospitalization.  Creatinine has been rising.  He is on IV Lasix 40 mg twice daily.    Past Medical History:  Diagnosis Date   A-fib [facility])    Abscess of external ear, left 01/11/2016   Acute deep vein thrombosis (DVT) of femoral vein of left lower extremity (HCC)    VQ scan negative/low probability   Arthritis HANDS/ THUMBS/ SHOULDERS   Bacteremia due to Enterococcus 10/09/2015   Bladder calculi    Chronic obstructive asthma, unspecified FOLLOWED BY DR Tarri Fuller YOUNG  VISIT 10-14-2011 IN EPIC   COPD (chronic obstructive pulmonary disease) (HCC)    Cyst on ear    left   Diabetes mellitus    DVT (deep venous thrombosis) (HCC)    Dyspnea on exertion    GERD (gastroesophageal reflux disease)    Hematuria    History of prostate cancer 2010-- S/P  EXTERNAL RADIATION   Hydronephrosis, left    Hypertension    Infected cyst of skin 01/25/2016   OSA on CPAP    wears CPAP   Sepsis ([place])    Sepsis secondary to UTI ([place]) 10/06/2015   UTI (lower urinary tract infection) 10/06/2015   Wears glasses      Past Surgical History:  Procedure Laterality Date   CARPAL TUNNEL  RELEASE  05-28-2005   LEFT   CYSTOSCOPY N/A 09/26/2012   Procedure: CYSTOSCOPY, clot evacuation,fulgeration of bladder, bladder biopsy, transurethreal vaporation of prostat;  Surgeon: Alexis Frock, MD;  Location: [facility];  Service: Urology;  Laterality: N/A;   CYSTOSCOPY W/ RETROGRADES Bilateral 10/14/2015   Procedure: CYSTOSCOPY WITH BILATERAL RETROGRADE PYELOGRAM/ FULGERATION WITH LITHOLAPAXY;  Surgeon: Festus Aloe, MD;  Location: [facility];  Service: Urology;  Laterality: Bilateral;   CYSTOSCOPY WITH LITHOLAPAXY  10/18/2011   Procedure: CYSTOSCOPY WITH LITHOLAPAXY;  Surgeon: Hanley Ben, MD;  Location: [place];  Service: Urology;  Laterality: N/A;  1 HOUR NO STENT H# [id number] CELL# [phone]  MEDICARE BCBS   EAR CYST EXCISION Left 02/23/2016   Procedure: EXCISION LEFT EAR CYST;  Surgeon: Izora Gala, MD;  Location: [place];  Service: ENT;  Laterality: Left;   GANGLION CYST EXCISION  1988  (APPROX)   [place]   TEE WITHOUT CARDIOVERSION N/A 10/11/2015   Procedure: TRANSESOPHAGEAL ECHOCARDIOGRAM (TEE);  Surgeon: Sanda Klein, MD;  Location: [facility] ENDOSCOPY;  Service: Cardiovascular;  Laterality: N/A;      Family History  Problem Relation Age of Onset   Diabetes Father      Social History: Social History   Socioeconomic History   Marital status: Widowed    Spouse name: Not on file  Number of children: 3   Years of education: Not on file   Highest education level: Associate degree: occupational, Hotel manager, or vocational program  Occupational History   Occupation: Retired    Comment: formerly Production assistant, radio  Tobacco Use   Smoking status: Former    Packs/day: 0.50    Years: 25.00    Total pack years: 12.50    Types: Cigarettes    Quit date: 03/25/1998    Years since quitting: 23.5   Smokeless tobacco: Never  Vaping Use   Vaping Use: Never used  Substance and Sexual Activity   Alcohol use: Not Currently   Drug use: No    Sexual activity: Not on file  Other Topics Concern   Not on file  Social History Narrative   Not on file   Social Determinants of Health   Financial Resource Strain: Low Risk  (09/03/2021)   Overall Financial Resource Strain (CARDIA)    Difficulty of Paying Living Expenses: Not very hard  Food Insecurity: No Food Insecurity (09/03/2021)   Hunger Vital Sign    Worried About Running Out of Food in the Last Year: Never true    [place] in the Last Year: Never true  Transportation Needs: No Transportation Needs (09/03/2021)   PRAPARE - Hydrologist (Medical): No    Lack of Transportation (Non-Medical): No  Physical Activity: Not on file  Stress: Not on file  Social Connections: Not on file  Intimate Partner Violence: Not on file     Medications Prior to Admission  Medication Sig Dispense Refill Last Dose   cinacalcet (SENSIPAR) 30 MG tablet Take 30 mg by mouth daily with supper.   09/17/2021   diltiazem (CARDIZEM CD) 240 MG 24 hr capsule Take 240 mg by mouth daily.   09/18/2021   ELIQUIS 2.5 MG TABS tablet Take 2.5 mg by mouth 2 (two) times daily.   09/18/2021 at 1200   finasteride (PROSCAR) 5 MG tablet Take 5 mg by mouth daily.   09/18/2021   gabapentin (NEURONTIN) 100 MG capsule Take 100 mg by mouth at bedtime.   09/17/2021   isosorbide dinitrate (ISORDIL) 30 MG tablet TAKE 1 TABLET BY MOUTH THREE TIMES DAILY (Patient taking differently: Take 30 mg by mouth 3 (three) times daily.) 90 tablet 0 09/18/2021   labetalol (NORMODYNE) 100 MG tablet Take 1 tablet (100 mg total) by mouth 2 (two) times daily. 60 tablet 5 09/18/2021 at 1200   pantoprazole (PROTONIX) 40 MG tablet Take 40 mg by mouth daily.   09/18/2021   potassium chloride SA (KLOR-CON M) 20 MEQ tablet Take 20 mEq by mouth daily.   09/18/2021   tamsulosin (FLOMAX) 0.4 MG CAPS capsule Take 1 capsule (0.4 mg total) by mouth daily. (Patient taking differently: Take 0.4 mg by mouth in the morning and at  bedtime.) 30 capsule 1 09/18/2021   torsemide (DEMADEX) 100 MG tablet Take 0.5 tablets (50 mg total) by mouth daily. 30 tablet 0 09/18/2021   CONTOUR NEXT TEST test strip daily.      Fluticasone-Umeclidin-Vilant (TRELEGY ELLIPTA) 100-62.5-25 MCG/INH AEPB Inhale 1 puff into the lungs daily. (Patient not taking: Reported on 09/19/2021) 14 each 0 Not Taking   Microlet Lancets MISC        Review of Systems  Cardiovascular:  Negative for chest pain, dyspnea on exertion, leg swelling, palpitations and syncope.      Physical Exam: Physical Exam Vitals and nursing note reviewed.  Constitutional:  General: He is not in acute distress. Neck:     Vascular: JVD present.  Cardiovascular:     Rate and Rhythm: Normal rate. Rhythm irregular.     Heart sounds: Normal heart sounds. No murmur heard. Pulmonary:     Effort: Pulmonary effort is normal.     Breath sounds: Normal breath sounds. No wheezing or rales.  Musculoskeletal:     Right lower leg: Edema (2+) present.     Left lower leg: Edema (2+) present.        Imaging/tests reviewed and independently interpreted: Lab Results: CBC, BMP, BNP, trop HS  Cardiac Studies:  EKG 09/18/2021: Afib 67 bpm  VQ scan 09/19/2021: Imaging findings suggest low probability for pulmonary embolism.     Echocardiogram 08/31/2021:  1. Left ventricular ejection fraction, by estimation, is 45 to 50%. The  left ventricle has mildly decreased function. The left ventricle  demonstrates global hypokinesis. There is mild left ventricular  hypertrophy. Left ventricular diastolic parameters are indeterminate.   2. Right ventricular systolic function is mildly reduced. The right  ventricular size is normal. There is severely elevated pulmonary artery  systolic pressure. The estimated right ventricular systolic pressure is  22.9 mmHg.   3. Left atrial size was severely dilated.   4. Right atrial size was moderately dilated.   5. The mitral valve is normal in  structure. Mild to moderate mitral valve  regurgitation. No evidence of mitral stenosis.   6. The aortic valve is tricuspid. There is mild calcification of the  aortic valve. Aortic valve regurgitation is not visualized. No aortic  stenosis is present.   7. The inferior vena cava is dilated in size with <50% respiratory  variability, suggesting right atrial pressure of 15 mmHg.   8. The patient is in atrial fibrillation.     Assessment & Recommendations:   86 y.o. 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 Continue IV diuresis with Lasix 40 mg twice daily. Unfortunately, not a candidate for initial due to inhibitor/ARNI/MRA. If blood pressures will tolerate, could consider switching isosorbide dinitrate to BiDil. In addition, I recommend adding sildenafil 20 mg 3 times daily.  My clinical suspicion for any intrapulmonary shunting that could worsen with sildenafil is very low.  Therefore, sildenafil is appropriate for palliative management of advanced pulmonary hypertension.  Persistent A-fib: Rate controlled.  High CHA2DS2-VASc 4.  Continue Eliquis 2.5 mg twice daily.  Trop elevation: Secondary to above  I had a long conversation with the patient in person, and patient's son over the phone.  Patient has advanced age, multiple comorbidities with deterioration over the past few months.  I am concerned about his home safety.  Overall, prognosis is poor.  I do think would benefit from evaluation for palliative care.  Placement in nursing home or rehab is perfectly reasonable.  Also encouraged patient and his son to have serious discussions regarding CODE STATUS.  I do not think he will do well after any potential  cardiac arrest and resuscitation measures.  Discussed interpretation of tests and management recommendations with the primary team     [name], MD Pager: [phone] Office: [phone]

## 2021-09-20 NOTE — Progress Notes (Signed)
RT at bedside for morning breathing tx. RT found pt on room air. No BiPAP or CPAP in room.

## 2021-09-20 NOTE — NC FL2 (Signed)
[place] LEVEL OF CARE SCREENING TOOL     IDENTIFICATION  Patient Name: Roy [name] Birthdate: 11/14/1935 Sex: male Admission Date (Current Location): 09/18/2021  [facility] and [place] Number:  Herbalist and Address:  [facility], [place] [address], [place], [place] [address]      Provider Number: [id number]  Attending Physician Name and Address:  Barb Merino, MD  Relative Name and Phone Number:  Jerrett, [place] [phone])   [phone] [facility])    Current Level of Care: Hospital Recommended Level of Care: [place] Prior Approval Number:    Date Approved/Denied:   PASRR Number: [id number]  Discharge Plan: SNF    Current Diagnoses: Patient Active Problem List   Diagnosis Date Noted   Positive D dimer 09/19/2021   Thrombocytopenia ([place]) 09/19/2021   Acute on chronic diastolic CHF (congestive heart failure) ([place]) 09/18/2021   Acute on chronic diastolic heart failure ([place]) 08/30/2021   Weakness 08/30/2021   CHF exacerbation ([place]) 08/30/2021   Elevated troponin 08/30/2021   Palliative care encounter 07/28/2021   Needs assistance with community resources 07/10/2021   Syncope and collapse 02/20/2021   Elevated LFTs 02/20/2021   Leukocytosis 02/20/2021   Chronic diastolic CHF (congestive heart failure) ([place]) 02/20/2021   Pulmonary hypertension, unspecified ([place]) 12/17/2019   Persistent atrial fibrillation ([place]) 12/17/2019   Leg edema 11/18/2019   Exertional dyspnea 11/18/2019   CKD (chronic kidney disease), stage IV ([place]) 10/06/2015   Benign prostatic hyperplasia with urinary obstruction 07/24/2015   DM2 (diabetes mellitus, type 2) ([place]) 01/06/2007   EXOGENOUS OBESITY 01/06/2007   OSA on CPAP 01/06/2007   Uncontrolled hypertension 01/06/2007   Chronic obstructive airway disease with asthma ([place]) 01/06/2007    Orientation RESPIRATION BLADDER Height & Weight     Self, Place  Normal External catheter  Weight: 185 lb 10 oz (84.2 kg) Height:     BEHAVIORAL SYMPTOMS/MOOD NEUROLOGICAL BOWEL NUTRITION STATUS      Continent Diet (See d/c summary)  AMBULATORY STATUS COMMUNICATION OF NEEDS Skin   Extensive Assist Verbally Normal                       Personal Care Assistance Level of Assistance  Bathing, Feeding, Dressing Bathing Assistance: Limited assistance Feeding assistance: Independent Dressing Assistance: Limited assistance     Functional Limitations Info  Sight, Speech, Hearing Sight Info: Impaired Hearing Info: Impaired Speech Info: Adequate    SPECIAL CARE FACTORS FREQUENCY  OT (By licensed OT), PT (By licensed PT)     PT Frequency: 5x/week OT Frequency: 5x/week            Contractures Contractures Info: Not present    Additional Factors Info  Code Status, Allergies Code Status Info: Full code Allergies Info: no known allergies           Current Medications (09/20/2021):  This is the current hospital active medication list Current Facility-Administered Medications  Medication Dose Route Frequency Provider Last Rate Last Admin   acetaminophen (TYLENOL) tablet 650 mg  650 mg Oral Q6H PRN Opyd, Ilene Qua, MD       Or   acetaminophen (TYLENOL) suppository 650 mg  650 mg Rectal Q6H PRN Opyd, Ilene Qua, MD       apixaban (ELIQUIS) tablet 2.5 mg  2.5 mg Oral BID Opyd, Ilene Qua, MD   2.5 mg at 09/20/21 1005   cinacalcet (SENSIPAR) tablet 30 mg  30 mg Oral Q breakfast Opyd,  Ilene Qua, MD   30 mg at 09/20/21 1006   diltiazem (CARDIZEM CD) 24 hr capsule 240 mg  240 mg Oral Daily Opyd, Ilene Qua, MD   240 mg at 09/20/21 1005   finasteride (PROSCAR) tablet 5 mg  5 mg Oral Daily Opyd, Ilene Qua, MD   5 mg at 09/20/21 1005   fluticasone furoate-vilanterol (BREO ELLIPTA) 100-25 MCG/ACT 1 puff  1 puff Inhalation Daily Opyd, Ilene Qua, MD   1 puff at 09/20/21 0849   And   umeclidinium bromide (INCRUSE ELLIPTA) 62.5 MCG/ACT 1 puff  1 puff Inhalation Daily Opyd, Ilene Qua, MD   1 puff at 09/20/21 0850   furosemide (LASIX) injection 40 mg  40 mg Intravenous Q12H Opyd, Ilene Qua, MD   40 mg at 09/20/21 0538   insulin aspart (novoLOG) injection 0-6 Units  0-6 Units Subcutaneous TID WC Opyd, Ilene Qua, MD       isosorbide dinitrate (ISORDIL) tablet 30 mg  30 mg Oral TID Vianne Bulls, MD   30 mg at 09/20/21 1006   labetalol (NORMODYNE) tablet 100 mg  100 mg Oral BID Opyd, Ilene Qua, MD       pantoprazole (PROTONIX) EC tablet 40 mg  40 mg Oral Daily Opyd, Ilene Qua, MD   40 mg at 09/20/21 1005   sodium chloride flush (NS) 0.9 % injection 3 mL  3 mL Intravenous Q12H Opyd, Ilene Qua, MD   3 mL at 09/20/21 1007   tamsulosin (FLOMAX) capsule 0.4 mg  0.4 mg Oral Daily Opyd, Ilene Qua, MD   0.4 mg at 09/20/21 1006     Discharge Medications: Please see discharge summary for a list of discharge medications.  Relevant Imaging Results:  Relevant Lab Results:   Additional Information SSN: [id number]; Moderna COVID-19 Vaccine 01/18/2020 , 06/04/2019 , 05/07/2019  [place], LCSW

## 2021-09-21 DIAGNOSIS — I5033 Acute on chronic diastolic (congestive) heart failure: Secondary | ICD-10-CM | POA: Diagnosis not present

## 2021-09-21 DIAGNOSIS — J449 Chronic obstructive pulmonary disease, unspecified: Secondary | ICD-10-CM | POA: Diagnosis not present

## 2021-09-21 DIAGNOSIS — R5381 Other malaise: Secondary | ICD-10-CM

## 2021-09-21 DIAGNOSIS — R6889 Other general symptoms and signs: Secondary | ICD-10-CM

## 2021-09-21 DIAGNOSIS — R778 Other specified abnormalities of plasma proteins: Secondary | ICD-10-CM | POA: Diagnosis not present

## 2021-09-21 DIAGNOSIS — N184 Chronic kidney disease, stage 4 (severe): Secondary | ICD-10-CM | POA: Diagnosis not present

## 2021-09-21 LAB — BASIC METABOLIC PANEL
Anion gap: 8 (ref 5–15)
BUN: 60 mg/dL — ABNORMAL HIGH (ref 8–23)
CO2: 26 mmol/L (ref 22–32)
Calcium: 8.4 mg/dL — ABNORMAL LOW (ref 8.9–10.3)
Chloride: 104 mmol/L (ref 98–111)
Creatinine, Ser: 3.29 mg/dL — ABNORMAL HIGH (ref 0.61–1.24)
GFR, Estimated: 18 mL/min — ABNORMAL LOW (ref >60)
Glucose, Bld: 103 mg/dL — ABNORMAL HIGH (ref 70–99)
Potassium: 3.6 mmol/L (ref 3.5–5.1)
Sodium: 138 mmol/L (ref 135–145)

## 2021-09-21 LAB — GLUCOSE, CAPILLARY
Glucose-Capillary: 93 mg/dL (ref 70–99)
Glucose-Capillary: 167 mg/dL — ABNORMAL HIGH (ref 70–99)
Glucose-Capillary: 92 mg/dL (ref 70–99)
Glucose-Capillary: 113 mg/dL — ABNORMAL HIGH (ref 70–99)

## 2021-09-21 MED ORDER — ISOSORB DINITRATE-HYDRALAZINE 20-37.5 MG PO TABS
1.0000 | ORAL_TABLET | Freq: Three times a day (TID) | ORAL | Status: DC
  Administered 2021-09-21 – 2021-09-24 (×12): 1 via ORAL
  Filled 2021-09-21 (×12): qty 1

## 2021-09-21 MED ORDER — OXYCODONE HCL 5 MG PO TABS
2.5000 mg | ORAL_TABLET | Freq: Once | ORAL | Status: AC
  Administered 2021-09-21: 2.5 mg via ORAL
  Filled 2021-09-21: qty 1

## 2021-09-21 NOTE — Progress Notes (Signed)
PROGRESS NOTE    Roy [name]  GEX:[id number] DOB: [birth year] DOA: 09/18/2021 PCP: Rogers Blocker, MD    Brief Narrative:  86 year old gentleman with history of cognitive dysfunction, chronic atrial fibrillation on Eliquis, CKD stage IV, history of DVT, sleep apnea, hypertension lives alone at home brought to ER for evaluation of shortness of breath, low blood pressures and low heart rate and more confusion found when he went to PCP office.  Patient lives alone.  His son helps him to go to doctors.  He took him to routine checkup yesterday, where he was found with blood pressure 96 and heart rate in 40s and patient looked confused and weak so sent to ER for work-up.  In the emergency room he was given 250 mL of saline before arrival to ER and he was hemodynamically stable since then. Patient was on room air.  EKG with A-fib with heart rate 63.  CT head was normal.  BUN and creatinine 63/3.04 at about his levels.  Spurious platelet counts that ultimately showed adequate.  Lactic acid normal.  D-dimer 2.2.  Blood cultures were collected, he was given 1 dose of IV Lasix.  Admitted to hospital due to significant symptoms. Remains in the hospital.  Waiting for SNF bed.   Assessment & Plan  Acute on chronic combined heart failure, leg edema, shortness of breath: Patient has difficulty compliance with medications at home. Presented with significant bilateral leg swelling and ambulatory shortness of breath. Currently remains on IV Lasix 40 mg twice daily and diuresing well.  More than 2 L urine output last 24 hours.  Creatinine continues to worsen.  Diuretic holiday today. Closely monitor renal functions.  With the patient's cognitive decline, it will be difficult to maintain his cardiac status, he will need more supervised living.  See discussion below.  Elevated D-dimer, history of DVT and noncompliance to Eliquis: The patient is on renally dosed Eliquis, angiogram is not possible.  A VQ scan was  done that showed low probability of PE.  Chronic A-fib, reported bradycardia: Rate controlled.  Therapeutic on Eliquis.   Heart rate improved after discontinuing labetalol.  Continued Cardizem.   Followed by cardiology.    Obstructive sleep apnea: Do not use CPAP.  Weakness and confusion: Deconditioned, cognitive decline.  No acute neurological findings of focal deficits. CT head was essentially normal. Fall precautions.  Delirium precautions.  PT/OT. Palliative care consultation,he will probably need skilled nursing rehab as well assisted living facility level of care after discharge.  His children are exploring [facility] benefits.  Type 2 diabetes: Well-controlled.  On diet controlled at home.  No indication to treat at this time.  Acute kidney injury on CKD stage IV: Creatinine elevated today from baseline.  High risk of decompensation.  Continue monitoring closely.  Diuretic holiday today.  Recheck tomorrow.  Case discussed with cardiology.  DVT prophylaxis: apixaban (ELIQUIS) tablet 2.5 mg Start: 09/19/21 1000 apixaban (ELIQUIS) tablet 2.5 mg   Code Status: Full code, palliative following.  Family meeting today. Family Communication: Son Lennette Bihari at the bedside.  Updated. Disposition Plan: Status is: Inpatient Remains inpatient appropriate because: IV diuresis, significant debility.  Unsafe discharge.     Consultants:  Cardiology Palliative  Procedures:  None  Antimicrobials:  None   Subjective:  Patient seen and examined.  Awake and eating breakfast.  Looks fairly comfortable and interactive today.  Forgetful but interactive.  Objective: Vitals:   09/20/21 1951 09/21/21 0503 09/21/21 0837 09/21/21 0846  BP: 131/68 137/74  125/65  Pulse: 65 64 87 75  Resp: '20 20 18 16  '$ Temp: 99 F (37.2 C) 98.4 F (36.9 C)  98.2 F (36.8 C)  TempSrc: Oral Oral  Oral  SpO2: 100% 98% 100% 96%  Weight:  83.8 kg      Intake/Output Summary (Last 24 hours) at 09/21/2021 1326 Last data  filed at 09/21/2021 0508 Gross per 24 hour  Intake 333 ml  Output 950 ml  Net -617 ml   Filed Weights   09/20/21 0106 09/21/21 0503  Weight: 84.2 kg 83.8 kg    Examination:  General exam: Appears calm and comfortable  Oriented to self and family.  Eating breakfast.  Looks comfortable.  On room air. Respiratory system: Mostly clear.  No added sounds. Cardiovascular system: S1 & S2 heard, irregularly irregular.  3+ bilateral leg edema, slightly improved than yesterday. Gastrointestinal system: Abdomen is nondistended, soft and nontender. No organomegaly or masses felt. Normal bowel sounds heard    Data Reviewed: I have personally reviewed following labs and imaging studies  CBC: Recent Labs  Lab 09/18/21 1805 09/19/21 0448 09/20/21 0625  WBC 4.4 4.8 6.0  NEUTROABS 3.3  --   --   HGB 10.1* 9.9* 10.0*  HCT 31.0* 30.5* 30.3*  MCV 98.4 97.4 95.6  PLT 113* 111* 952*   Basic Metabolic Panel: Recent Labs  Lab 09/18/21 1805 09/19/21 0448 09/20/21 0625 09/21/21 0249  NA 139 143 140 138  K 4.1 4.0 3.8 3.6  CL 107 106 105 104  CO2 '22 25 26 26  '$ GLUCOSE 93 78 90 103*  BUN 63* 61* 61* 60*  CREATININE 3.04* 3.11* 3.18* 3.29*  CALCIUM 8.7* 9.0 8.8* 8.4*  MG  --  1.7  --   --    GFR: Estimated Creatinine Clearance: 17.3 mL/min (A) (by C-G formula based on SCr of 3.29 mg/dL (H)). Liver Function Tests: Recent Labs  Lab 09/18/21 1805  AST 20  ALT 26  ALKPHOS 110  BILITOT 1.1  PROT 5.7*  ALBUMIN 3.0*   No results for input(s): "LIPASE", "AMYLASE" in the last 168 hours. No results for input(s): "AMMONIA" in the last 168 hours. Coagulation Profile: No results for input(s): "INR", "PROTIME" in the last 168 hours. Cardiac Enzymes: No results for input(s): "CKTOTAL", "CKMB", "CKMBINDEX", "TROPONINI" in the last 168 hours. BNP (last 3 results) No results for input(s): "PROBNP" in the last 8760 hours. HbA1C: Recent Labs    09/19/21 0448  HGBA1C 5.4   CBG: Recent Labs   Lab 09/20/21 1057 09/20/21 1629 09/20/21 2121 09/21/21 0616 09/21/21 1040  GLUCAP 148* 203* 119* 93 167*   Lipid Profile: No results for input(s): "CHOL", "HDL", "LDLCALC", "TRIG", "CHOLHDL", "LDLDIRECT" in the last 72 hours. Thyroid Function Tests: No results for input(s): "TSH", "T4TOTAL", "FREET4", "T3FREE", "THYROIDAB" in the last 72 hours. Anemia Panel: No results for input(s): "VITAMINB12", "FOLATE", "FERRITIN", "TIBC", "IRON", "RETICCTPCT" in the last 72 hours. Sepsis Labs: Recent Labs  Lab 09/18/21 1805 09/18/21 2000  LATICACIDVEN 1.2 1.3    Recent Results (from the past 240 hour(s))  Culture, blood (routine x 2)     Status: None (Preliminary result)   Collection Time: 09/18/21 12:05 PM   Specimen: BLOOD  Result Value Ref Range Status   Specimen Description BLOOD RIGHT UPPER ARM  Final   Special Requests   Final    BOTTLES DRAWN AEROBIC AND ANAEROBIC Blood Culture adequate volume   Culture   Final    NO GROWTH 2 DAYS  Performed at [facility], [place] [address] [place], [place] [address]    Report Status PENDING  Incomplete  Culture, blood (routine x 2)     Status: None (Preliminary result)   Collection Time: 09/18/21  5:57 PM   Specimen: BLOOD  Result Value Ref Range Status   Specimen Description BLOOD BLOOD RIGHT FOREARM  Final   Special Requests   Final    BOTTLES DRAWN AEROBIC AND ANAEROBIC Blood Culture adequate volume   Culture   Final    NO GROWTH 2 DAYS Performed at [facility], [place] [address], [place], [place] [address]    Report Status PENDING  Incomplete  Urine Culture     Status: None   Collection Time: 09/19/21 12:36 AM   Specimen: Urine, Clean Catch  Result Value Ref Range Status   Specimen Description URINE, CLEAN CATCH  Final   Special Requests NONE  Final   Culture   Final    NO GROWTH Performed at [facility], [place] [address] [place], [place] [address]    Report Status 09/19/2021 FINAL  Final          Radiology Studies: No results found.      Scheduled Meds:  apixaban  2.5 mg Oral BID   cinacalcet  30 mg Oral Q breakfast   diltiazem  240 mg Oral Daily   finasteride  5 mg Oral Daily   fluticasone furoate-vilanterol  1 puff Inhalation Daily   And   umeclidinium bromide  1 puff Inhalation Daily   insulin aspart  0-6 Units Subcutaneous TID WC   isosorbide-hydrALAZINE  1 tablet Oral TID   pantoprazole  40 mg Oral Daily   sildenafil  20 mg Oral TID   sodium chloride flush  3 mL Intravenous Q12H   tamsulosin  0.4 mg Oral Daily   Continuous Infusions:   LOS: 3 days    Time spent: 35 minutes    Barb Merino, MD [facility] Pager [phone]

## 2021-09-21 NOTE — TOC Progression Note (Signed)
Transition of Care [facility]) - Progression Note    Patient Details  Name: Roy [name] MRN: [id number] Date of Birth: 21-May-1935  Transition of Care [facility]) CM/SW [place], [place] Phone Number: 09/21/2021, 4:17 PM  Clinical Narrative:     Family wanting [facility]. [place] may have beds opening this weekend but if not would likely be Monday. Pt's other offers cannot accept until Monday. CSW will submit insurance auth and [facility] has bed this weekend, pt could DC. MD notified. TOC will follow for possible bed availability at [facility] this weekend.   CSW called and updated pt's son [facility] auth pending.   Expected Discharge Plan: [place] Barriers to Discharge: Continued Medical Work up, Ship broker  Expected Discharge Plan and Services Expected Discharge Plan: [place]       Living arrangements for the past 2 months: Single Family Home                                       Social Determinants of Health (SDOH) Interventions    Readmission Risk Interventions     No data to display

## 2021-09-21 NOTE — Progress Notes (Signed)
Physical Therapy Treatment Patient Details Name: Roy [name] MRN: [id number] DOB: Apr 19, 1935 Today's Date: 09/21/2021   History of Present Illness Pt is an 86 y/o male admitted 6/28 secondary to hypotension and weakness. Thought to be secondary to CHF. PMH includes afib on Eliquis, HF, DVT, asthma, COPD, DM, pulmonary HTN, GERD, HTN, BPH, CKD IV, OSA on CPAP.    PT Comments    Patient is agreeable to PT with encouragement. He declined standing but did sit on the edge of bed for ~ 8 minutes with assistance. No significant change in vitals noted with activity. Patient fatigue with minimal activity. Recommend to continue PT to maximize independence and facilitate return to prior level of function.    Recommendations for follow up therapy are one component of a multi-disciplinary discharge planning process, led by the attending physician.  Recommendations may be updated based on patient status, additional functional criteria and insurance authorization.  Follow Up Recommendations  Skilled nursing-short term rehab (<3 hours/day) Can patient physically be transported by private vehicle: No   Assistance Recommended at Discharge Frequent or constant Supervision/Assistance  Patient can return home with the following Assistance with cooking/housework;Direct supervision/assist for medications management;Direct supervision/assist for financial management;Assist for transportation;Help with stairs or ramp for entrance;A lot of help with walking and/or transfers;A lot of help with bathing/dressing/bathroom   Equipment Recommendations  None recommended by PT    Recommendations for Other Services       Precautions / Restrictions Precautions Precautions: Fall Restrictions Weight Bearing Restrictions: No     Mobility  Bed Mobility Overal bed mobility: Needs Assistance Bed Mobility: Supine to Sit, Sit to Supine     Supine to sit: Max assist, HOB elevated Sit to supine: Mod assist    General bed mobility comments: assistance for trunk and BLE support to sit upright. assistance for LE support to return to bed. cues for technique and sequencing. increased time and effort required    Transfers Overall transfer level: Needs assistance                 General transfer comment: moderate to maximal assistance for incremetal scooting to the right with cues for hand placement and positoning. patient fatigued with activity. he refused to stand despite offering multiple times    Ambulation/Gait                   Stairs             Wheelchair Mobility    Modified Rankin (Stroke Patients Only)       Balance Overall balance assessment: Needs assistance Sitting-balance support: No upper extremity supported Sitting balance-Leahy Scale: Fair                                      Cognition Arousal/Alertness: Awake/alert Behavior During Therapy: WFL for tasks assessed/performed Overall Cognitive Status: No family/caregiver present to determine baseline cognitive functioning                         Following Commands: Follows one step commands consistently Safety/Judgement: Decreased awareness of deficits, Decreased awareness of safety   Problem Solving: Slow processing, Requires verbal cues, Requires tactile cues          Exercises General Exercises - Lower Extremity Ankle Circles/Pumps: AROM, Strengthening, Both, 10 reps, Supine Long Arc Quad: AROM, Strengthening, Both, 10 reps, Seated Other Exercises Other  Exercises: verbal and visual cues for exercise technique for strengthening    General Comments        Pertinent Vitals/Pain Pain Assessment Pain Assessment: Faces Faces Pain Scale: Hurts little more Pain Location: bilateral distal legs Pain Descriptors / Indicators: Tender, Grimacing Pain Intervention(s): Limited activity within patient's tolerance, Monitored during session    Home Living                           Prior Function            PT Goals (current goals can now be found in the care plan section) Acute Rehab PT Goals Patient Stated Goal: to go home PT Goal Formulation: With patient Time For Goal Achievement: 10/03/21 Potential to Achieve Goals: Good Progress towards PT goals: Progressing toward goals    Frequency    Min 2X/week      PT Plan Current plan remains appropriate    Co-evaluation              AM-PAC PT "6 Clicks" Mobility   Outcome Measure  Help needed turning from your back to your side while in a flat bed without using bedrails?: A Little Help needed moving from lying on your back to sitting on the side of a flat bed without using bedrails?: A Lot Help needed moving to and from a bed to a chair (including a wheelchair)?: A Lot Help needed standing up from a chair using your arms (e.g., wheelchair or bedside chair)?: A Lot Help needed to walk in hospital room?: A Lot Help needed climbing 3-5 steps with a railing? : A Lot 6 Click Score: 13    End of Session   Activity Tolerance: Patient limited by fatigue Patient left: in bed;with call bell/phone within reach;with bed alarm set Nurse Communication: Mobility status PT Visit Diagnosis: Unsteadiness on feet (R26.81);Muscle weakness (generalized) (M62.81);Difficulty in walking, not elsewhere classified (R26.2)     Time: 6222-9798 PT Time Calculation (min) (ACUTE ONLY): 21 min  Charges:  $Therapeutic Activity: 8-22 mins                     Minna Merritts, PT, MPT    Percell Locus 09/21/2021, 1:30 PM

## 2021-09-21 NOTE — Progress Notes (Signed)
Subjective:  Feels okay  Objective:  Vital Signs in the last 24 hours: Temp:  [98.2 F (36.8 C)-99 F (37.2 C)] 98.2 F (36.8 C) (06/30 0846) Pulse Rate:  [64-87] 75 (06/30 0846) Resp:  [16-20] 16 (06/30 0846) BP: (125-137)/(65-74) 125/65 (06/30 0846) SpO2:  [96 %-100 %] 96 % (06/30 0846) Weight:  [83.8 kg] 83.8 kg (06/30 0503)  Intake/Output from previous day: 06/29 0701 - 06/30 0700 In: 873 [P.O.:870; I.V.:3] Out: 2000 [Urine:2000]  Physical Exam Vitals and nursing note reviewed.  Constitutional:      General: He is not in acute distress. Neck:     Vascular: JVD present.  Cardiovascular:     Rate and Rhythm: Normal rate. Rhythm irregular.     Heart sounds: Normal heart sounds. No murmur heard. Pulmonary:     Effort: Pulmonary effort is normal.     Breath sounds: Normal breath sounds. No wheezing or rales.  Musculoskeletal:     Right lower leg: Edema (2+) present.     Left lower leg: Edema (2+) present.      Imaging/tests reviewed and independently interpreted: Telemetry: Rate controlled Afib  Cardiac Studies:  EKG 09/18/2021: Afib 67 bpm   VQ scan 09/19/2021: Imaging findings suggest low probability for pulmonary embolism.     Echocardiogram 08/31/2021:  1. Left ventricular ejection fraction, by estimation, is 45 to 50%. The  left ventricle has mildly decreased function. The left ventricle  demonstrates global hypokinesis. There is mild left ventricular  hypertrophy. Left ventricular diastolic parameters are indeterminate.   2. Right ventricular systolic function is mildly reduced. The right  ventricular size is normal. There is severely elevated pulmonary artery  systolic pressure. The estimated right ventricular systolic pressure is  93.8 mmHg.   3. Left atrial size was severely dilated.   4. Right atrial size was moderately dilated.   5. The mitral valve is normal in structure. Mild to moderate mitral valve  regurgitation. No evidence of mitral  stenosis.   6. The aortic valve is tricuspid. There is mild calcification of the  aortic valve. Aortic valve regurgitation is not visualized. No aortic  stenosis is present.   7. The inferior vena cava is dilated in size with <50% respiratory  variability, suggesting right atrial pressure of 15 mmHg.   8. The patient is in atrial fibrillation.    Assessment & Recommendations:  86 y.o. 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 Continue IV diuresis with Lasix 40 mg twice daily. 2 L out in last 24 hours. Unfortunately, not a candidate for initial due to inhibitor/ARNI/MRA. If blood pressures will tolerate, could consider switching isosorbide dinitrate to BiDil. Continue adding sildenafil 20 mg 3 times daily.  My clinical suspicion for any intrapulmonary shunting that could worsen with sildenafil is very low.  Therefore, sildenafil is appropriate for palliative management of advanced pulmonary hypertension.   Persistent A-fib: Rate controlled.  High CHA2DS2-VASc 4.  Continue Eliquis 2.5 mg twice daily.   Trop elevation: Secondary to above   Long term prognosis is guarded. Agree with ongoing palliative care discussion.      [name], MD Pager: [phone] Office: [phone]

## 2021-09-21 NOTE — Care Management Important Message (Signed)
Important Message  Patient Details  Name: Roy [name] MRN: [id number] Date of Birth: Feb 19, 1936   Medicare Important Message Given:  Yes     Shelda Altes 09/21/2021, 8:13 AM

## 2021-09-21 NOTE — Progress Notes (Addendum)
[facility]) Hospital Liaison note:  This patient is currently enrolled in [facility] outpatient-based Palliative Care. Will continue to follow for disposition.  2:45pm: Spoke to pt's son [name] and his wife and they voiced understanding of what happens after rehab. They know I will continue to follow through his hospital discharge and should they have additional questions I am available.  Please call with any outpatient palliative questions or concerns.  Thank you, Lorelee Market, LPN [facility] Liaison [phone]

## 2021-09-21 NOTE — Progress Notes (Signed)
Heart Failure Stewardship Pharmacist Progress Note   PCP: Rogers Blocker, MD PCP-Cardiologist: None    HPI:  86 yo M with PMH of HF, afb, DVT, asthma/COPD, T2DM, CKD IV, OSA on CPAP, pulmonary HTN, GERD, prior sepsis from UTI, and HTN.  He was last admitted from 6/8-6/12 for acute CHF. He was noncompliant with diet and with taking medications. ECHO 6/9 with LVEF 45-50%, mildly reduced RV, and mild-moderate MR. Discharged on torsemide 50 mg daily (same as PTA dose).   He was scheduled for follow up in HF [facility] clinic on 6/23 but needed to cancel appt.  He returned to the ED on 6/27 with confusion, hypotension, and bradycardia. Also with LE edema. He is unsure if he was taking PTA torsemide. CXR with cardiomegaly, vascular congestion, and mild interstitial edema.  Family meeting today with palliative care.  Current HF Medications: Diuretic: furosemide 40 mg IV q12h Other: BiDil 1 tablet TID, sildenafil 20 mg TID  Prior to admission HF Medications: Diuretic: torsemide 50 mg daily Other: Isordil 30 mg TID  Pertinent Lab Values: Serum creatinine 3.29, BUN 60, Potassium 3.6, Sodium 138, BNP 304.9, Magnesium 1.7, A1c 5.5 (02/20/21)   Vital Signs: Weight: 184 lbs (discharge weight on 6/12 189 lbs) Blood pressure: 130/70s  Heart rate: 60s - aflutter I/O: -1.9L yesterday; net -2.7L  Medication Assistance / Insurance Benefits Check: Does the patient have prescription insurance?  Yes Type of insurance plan: [place]:  Prior to admission outpatient pharmacy: [facility] Is the patient willing to use [place] pharmacy at discharge? Yes Is the patient willing to transition their outpatient pharmacy to utilize a [facility] outpatient pharmacy?   Pending    Assessment: 1. Acute on chronic diastolic CHF (LVEF 93-71%). NYHA class III symptoms. - On furosemide 40 mg IV q12h. Modest urine output. Consider increasing since weight only down 1 lb. Keep  K>4 and Mag>2. - No ACE/ARB/ARNI, MRA, or SGLT2i with advanced CKD - Continue BiDil 1 tab TID - On sildenafil 20 mg TID per MD - no renal adjustments for PAH.   Plan: 1) Medication changes recommended at this time: - Increase IV lasix  2) Patient assistance: - None pending - Possible SNF at discharge  3)  Education  - To be completed prior to discharge  Kerby Nora, PharmD, BCPS Heart Failure Stewardship Pharmacist Phone [phone]

## 2021-09-21 NOTE — Progress Notes (Signed)
Daily Progress Note   Patient Name: Roy [name]       Date: 09/21/2021 DOB: 05/17/1935  Age: 86 y.o. MRN#: [id number] Attending Physician: Barb Merino, MD Primary Care Physician: Rogers Blocker, MD Admit Date: 09/18/2021  Reason for Consultation/Follow-up: Establishing goals of care, "Goal of care, care coordination"  Subjective: Chart review performed. Received report from primary RN - no acute concerns. RN reports patient is awake/alert but very forgetful. RN reports patient is eating/drinking well.  Went to visit patient at bedside - no family/visitors present. Patient was lying in bed asleep - I did not attempt to wake him. No signs or non-verbal gestures of pain or discomfort noted. No respiratory distress, increased work of breathing, or secretions noted.   12:30 PM Called patient's son/Roy [name] - spoke with him and DIL/Roy [name] via speakerphone  to discuss diagnosis, prognosis, GOC, EOL wishes, disposition, and options.  I introduced Palliative Medicine as specialized medical care for people living with serious illness. It focuses on providing relief from the symptoms and stress of a serious illness. The goal is to improve quality of life for both the patient and the family.  We briefly went over life review of the patient as well as functional and nutritional status as discussed yesterday.  We discussed patient's current illness and what it means in the larger context of patient's on-going co-morbidities. Education provided that CHF and CKD are progressive, non-curable disease underlying the patient's current acute medical conditions. Family have a clear understanding of patient's acute medical situation. Natural disease trajectory and expectations at EOL were discussed. I attempted to  elicit values and goals of care important to the patient. The difference between aggressive medical intervention and comfort care was considered in light of the patient's goals of care. Goal at this time is to treat the treatable with discharge to rehab. Discussed that the goal of rehab is improvement/stabalization of functional status,  which can be a difficult goal to meet for patients with advanced illness and multiple medical conditions. Reviewed what is needed for someone to have a positive rehabilitation experience to include adequate nutritional intake as well as willingness/ability to participate, which patient seems to have at this time. After rehab, reviewed options to include discharge back home without support (which is not a safe option) vs home with private duty caregiver  support vs LTC vs ALF. Family understand that patient will need additional assistance with medications and meals at minimum, discussed possibility of needing 24/7 supervision/assistance pending rehab progress. Reviewed patient's insurance in context of above options to the best of my ability. Family are also looking into patient's VA benefits. Family know he does not yet qualify for Medicaid and most options will likely be looking at [facility] at this time.   During conversation, family were clear patient would not want to pursue HD if his CKD progressed to end stages. They understand hospice would be recommended at that time.  Patient is already enrolled with [facility] - recommended family continue to follow up with them. Family felt their initial visit was very beneficial but aren't sure how/when to make another appointment. Explained I would reach out to [facility] liaison to request patient have a follow up visit after discharge sooner than later to assist family with navigating decisions pending his progress at rehab - they were appreciative.  Advance directives, concepts specific to code status, and  rehospitalization were considered and discussed. Reviewed that patient is high risk for decompensation/rehospitalization at discharge. Patient does not have a HCPOA or Living Will - son states he and his brothers work well together to make decisions on their father's behalf, but Roy [name]'s two other brothers trust his judgment.   Roy [name] explained that one of his brothers were at bedside this morning - stated a "patient advocate" came by and during conversation recommended patient remain full code. Encouraged family to consider DNR/DNI status understanding evidenced based poor outcomes in similar hospitalized patient, as the cause of arrest is likely associated with advanced chronic/terminal illness rather than an easily reversible acute cardio-pulmonary event. I explained that DNR/DNI does not change the medical plan and it only comes into effect after a person has arrested (died).  It is a protective measure to keep [place] from harming the patient in their last moments of life. Roy [name] was not agreeable to DNR/DNI at this time with understanding that patient would receive CPR, defibrillation, ACLS medications, and/or intubation. Roy [name] is open to DNR/DNI but would like to discuss information provided today with his brothers before making final decisions. He will call PMT if decision is made for DNR/DNI so medical record can be updated.  Discussed with family the importance of continued conversation with each other and the medical providers regarding overall plan of care and treatment options, ensuring decisions are within the context of the patient's values and GOCs.    Questions and concerns were addressed. The patient/family was encouraged to call with questions and/or concerns. PMT number was previously provided.  Spoke with [facility] liaison - provided updates on hospitalization and requested, if able, [facility] provider follow up with patient sooner than later after discharge. They will follow up with patient/family.     Length of Stay: 3  Current Medications: Scheduled Meds:  . apixaban  2.5 mg Oral BID  . cinacalcet  30 mg Oral Q breakfast  . diltiazem  240 mg Oral Daily  . finasteride  5 mg Oral Daily  . fluticasone furoate-vilanterol  1 puff Inhalation Daily   And  . umeclidinium bromide  1 puff Inhalation Daily  . furosemide  40 mg Intravenous Q12H  . insulin aspart  0-6 Units Subcutaneous TID WC  . isosorbide-hydrALAZINE  1 tablet Oral TID  . pantoprazole  40 mg Oral Daily  . sildenafil  20 mg Oral TID  . sodium chloride flush  3 mL  Intravenous Q12H  . tamsulosin  0.4 mg Oral Daily    Continuous Infusions:   PRN Meds: acetaminophen **OR** acetaminophen  Physical Exam Vitals and nursing note reviewed.  Constitutional:      General: He is not in acute distress. Pulmonary:     Effort: No respiratory distress.  Skin:    General: Skin is warm and dry.  Neurological:     Motor: Weakness present.     Comments: Sleeping at time of assessment, but not lethargic             Vital Signs: BP 125/65 (BP Location: Right Arm)   Pulse 75   Temp 98.2 F (36.8 C) (Oral)   Resp 16   Wt 83.8 kg   SpO2 96%   BMI 26.51 kg/m  SpO2: SpO2: 96 % O2 Device: O2 Device: Room Air O2 Flow Rate:    Intake/output summary:  Intake/Output Summary (Last 24 hours) at 09/21/2021 1229 Last data filed at 09/21/2021 2122 Gross per 24 hour  Intake 513 ml  Output 950 ml  Net -437 ml   LBM: Last BM Date : 09/19/21 Baseline Weight: Weight: 84.2 kg Most recent weight: Weight: 83.8 kg       Palliative Assessment/Data: PPS 50%      Patient Active Problem List   Diagnosis Date Noted  . Positive D dimer 09/19/2021  . Thrombocytopenia ([place]) 09/19/2021  . Acute on chronic diastolic CHF (congestive heart failure) ([place]) 09/18/2021  . Acute on chronic diastolic heart failure ([place]) 08/30/2021  . Weakness 08/30/2021  . CHF exacerbation ([place]) 08/30/2021  . Elevated troponin 08/30/2021  . Palliative  care encounter 07/28/2021  . Needs assistance with community resources 07/10/2021  . Syncope and collapse 02/20/2021  . Elevated LFTs 02/20/2021  . Leukocytosis 02/20/2021  . Chronic diastolic CHF (congestive heart failure) ([place]) 02/20/2021  . Pulmonary hypertension, unspecified ([place]) 12/17/2019  . Persistent atrial fibrillation ([place]) 12/17/2019  . Leg edema 11/18/2019  . Exertional dyspnea 11/18/2019  . CKD (chronic kidney disease), stage IV ([place]) 10/06/2015  . Benign prostatic hyperplasia with urinary obstruction 07/24/2015  . DM2 (diabetes mellitus, type 2) ([place]) 01/06/2007  . EXOGENOUS OBESITY 01/06/2007  . OSA on CPAP 01/06/2007  . Uncontrolled hypertension 01/06/2007  . Chronic obstructive airway disease with asthma ([place]) 01/06/2007    Palliative Care Assessment & Plan   Patient Profile: 86 y.o. male  with past medical history of cognitive dysfunction, chronic atrial fibrillation on Eliquis, CKD stage IV, history of DVT, sleep apnea, hypertension, COPD, prostate cancer, HFmrEF, OSA on Bipap presented to ED on 09/18/21 after PCP evaluation where concerned patient was more weak and confused from baseline - found to be hypotensive and bradycardic. On arrival to ED patient was afebrile and saturating well on room air with normal heart rate and stable blood pressure. Head CT was negative for acute intracranial abnormality.  Chest x-ray features cardiomegaly, vascular congestion, and mild interstitial edema. Patient was admitted on 09/18/2021 with acute on chronic HRmrEF, elevated d-dimer, thrombocytopenia, weakness and confusion.   Of note, patient was recently hospitalized from 6/8-6/12/23 for CHF exacerbation. He is also followed by [facility] outpatient Palliative Care.    Assessment: Principal Problem:   Acute on chronic diastolic CHF (congestive heart failure) (HCC) Active Problems:   DM2 (diabetes mellitus, type 2) (HCC)   OSA on CPAP   Uncontrolled hypertension   Chronic  obstructive airway disease with asthma (HCC)   CKD (chronic kidney disease), stage IV (HCC)   Persistent  atrial fibrillation (HCC)   Elevated troponin   Positive D dimer   Thrombocytopenia (HCC)   Recommendations/Plan: Continue to treat the treatable Continue full code for now - family considering DNR/DNI Goal is for discharge to rehab - pending rehab progress family will make step wise decisions (discussed possible options/resources with family) Outpatient Palliative Care will continue to follow - will assist family with navigating decisions pending patient's progress at rehab Family are clear patient would not want to pursue HD if CKD progressed to end stages PMT will continue to follow peripherally. If there are any imminent needs please call the service directly  Goals of Care and Additional Recommendations: Limitations on Scope of Treatment: Full Scope Treatment  Code Status:    Code Status Orders  (From admission, onward)           Start     Ordered   09/19/21 0354  Full code  Continuous        09/19/21 0356           Code Status History     Date Active Date Inactive Code Status Order ID Comments User Context   08/30/2021 2227 09/03/2021 2101 Full Code [id number]  Shela Leff, MD ED   02/20/2021 1709 02/27/2021 0057 Full Code [id number]  Orma Flaming, MD ED   10/06/2015 2005 10/18/2015 1731 Full Code [id number]  Etta Quill, DO ED       Prognosis:  Unable to determine  Discharge Planning: [place] for rehab with Palliative care service follow-up  Care plan was discussed with primary RN, patient's family, Dr. Sloan Leiter, [facility] liaison  Thank you for allowing the Palliative Medicine Team to assist in the care of this patient.   Total Time 70 minutes Prolonged Time Billed  yes      Greater than 50%  of this time was spent counseling and coordinating care related to the above assessment and plan.  Lin Landsman, NP  Please  contact Palliative Medicine Team phone at [phone] for questions and concerns.   *Portions of this note are a verbal dictation therefore any spelling and/or grammatical errors are due to the "[facility]" system interpretation.

## 2021-09-22 DIAGNOSIS — I509 Heart failure, unspecified: Secondary | ICD-10-CM

## 2021-09-22 LAB — BASIC METABOLIC PANEL
Anion gap: 11 (ref 5–15)
BUN: 60 mg/dL — ABNORMAL HIGH (ref 8–23)
CO2: 23 mmol/L (ref 22–32)
Calcium: 8.8 mg/dL — ABNORMAL LOW (ref 8.9–10.3)
Chloride: 105 mmol/L (ref 98–111)
Creatinine, Ser: 3.33 mg/dL — ABNORMAL HIGH (ref 0.61–1.24)
GFR, Estimated: 18 mL/min — ABNORMAL LOW (ref >60)
Glucose, Bld: 88 mg/dL (ref 70–99)
Potassium: 3.7 mmol/L (ref 3.5–5.1)
Sodium: 139 mmol/L (ref 135–145)

## 2021-09-22 LAB — GLUCOSE, CAPILLARY
Glucose-Capillary: 87 mg/dL (ref 70–99)
Glucose-Capillary: 97 mg/dL (ref 70–99)
Glucose-Capillary: 96 mg/dL (ref 70–99)
Glucose-Capillary: 123 mg/dL — ABNORMAL HIGH (ref 70–99)

## 2021-09-22 MED ORDER — FUROSEMIDE 10 MG/ML IJ SOLN
40.0000 mg | Freq: Two times a day (BID) | INTRAMUSCULAR | Status: DC
  Administered 2021-09-22 – 2021-09-23 (×3): 40 mg via INTRAVENOUS
  Filled 2021-09-22 (×3): qty 4

## 2021-09-22 NOTE — Progress Notes (Signed)
Pt refused cpap tonight. 

## 2021-09-22 NOTE — Progress Notes (Signed)
   09/22/21 1136  Assess: MEWS Score  Temp 98.4 F (36.9 C)  BP (!) 104/56  Pulse Rate 88  Resp 18  SpO2 97 %  O2 Device Room Air  Assess: MEWS Score  MEWS Temp 0  MEWS Systolic 0  MEWS Pulse 0  MEWS RR 0  MEWS LOC 0  MEWS Score 0  MEWS Score Color Green  Assess: if the MEWS score is Yellow or Red  Were vital signs taken at a resting state? Yes  Focused Assessment No change from prior assessment  MEWS guidelines implemented *See Row Information* No, vital signs rechecked  Assess: SIRS CRITERIA  SIRS Temperature  0  SIRS Pulse 0  SIRS Respirations  0  SIRS WBC 0  SIRS Score Sum  0

## 2021-09-22 NOTE — Progress Notes (Signed)
[facility]                                                                              Roy [name], is a 86 y.o. male, DOB - 25-Dec-1935, ZYS:[id number] Admit date - 09/18/2021    Outpatient Primary MD for the patient is Rogers Blocker, MD  LOS - 4  days  Chief Complaint  Patient presents with   Hypotension       Brief summary   86 year old gentleman with history of cognitive dysfunction, chronic atrial fibrillation on Eliquis, CKD stage IV, history of DVT, sleep apnea, hypertension lives alone at home brought to ER for evaluation of shortness of breath, low blood pressures and low heart rate and more confusion found when he went to PCP office.  Patient lives alone.  His son helps him to go to doctors.  He took him to routine checkup yesterday, where he was found with blood pressure 96 and heart rate in 40s and patient looked confused and weak so sent to ER for work-up.  In the emergency room he was given 250 mL of saline before arrival to ER and he was hemodynamically stable since then. Patient was on room air.  EKG with A-fib with heart rate 63.  CT head was normal.  BUN and creatinine 63/3.04 at about his levels.  Spurious platelet counts that ultimately showed adequate.  Lactic acid normal.  D-dimer 2.2.  Blood cultures were collected, he was given 1 dose of IV Lasix.  Admitted to hospital due to significant symptoms. Remains in the hospital awaiting insurance authorization for SNF.     Assessment & Plan    Principal Problem:   Acute on chronic diastolic CHF (congestive heart failure) (HCC) -Presented with significant bilateral peripheral edema, shortness of breath, difficulty compliance with medications at home -Patient was placed on IV Lasix 40 mg every 12 hours for diuresis, strict I's and O's and daily weights -No ACE/ARB/ARNI with CKD, advanced and worsening renal function , continue BiDil -Continue to hold Lasix today, negative balance of 4.8 L -Weight on  admission 185.6lbs -> 179lbs today   Acute kidney injury on CKD stage IV -Baseline creatinine has been between 2.8-3, most recently in 3.3 range, likely his baseline -Creatinine 3.3, holding Lasix today  Elevated D-dimer, prior history of DVT and noncompliance to eliquis -Continue Eliquis 2.5 mg twice daily -Angiogram not possible due to advanced CKD, VQ scan showed low probability of PE   Chronic atrial fibrillation -Rate controlled, continue eliquis   Generalized debility, confusion, cognitive decline -No new FND's, CT head unremarkable, continue PT OT fall precautions -Palliative care consulted, will need SNF   Diabetes mellitus type 2 -Diet controlled at home, well controlled, hemoglobin A1c 5.4 on 09/19/2021   Code Status: Full code DVT Prophylaxis:  Place TED hose Start: 09/21/21 1825 apixaban (ELIQUIS) tablet 2.5 mg Start: 09/19/21 1000 apixaban (ELIQUIS) tablet 2.5 mg   Level of Care: Level of care: Telemetry Cardiac Family Communication:  Disposition Plan:      Remains inpatient appropriate: Awaiting skilled nursing facility   Procedures:  None  Consultants:     Antimicrobials:  Medications  apixaban  2.5 mg Oral BID   cinacalcet  30 mg Oral Q breakfast   diltiazem  240 mg Oral Daily   finasteride  5 mg Oral Daily   fluticasone furoate-vilanterol  1 puff Inhalation Daily   And   umeclidinium bromide  1 puff Inhalation Daily   insulin aspart  0-6 Units Subcutaneous TID WC   isosorbide-hydrALAZINE  1 tablet Oral TID   pantoprazole  40 mg Oral Daily   sodium chloride flush  3 mL Intravenous Q12H   tamsulosin  0.4 mg Oral Daily      Subjective:   Roy [name] was seen and examined today.  No acute complaints, appears to be comfortable.  Patient denies dizziness, chest pain, shortness of breath, abdominal pain, N/V/D. No acute events overnight.    Objective:   Vitals:   09/21/21 1932 09/22/21 0331 09/22/21 0806 09/22/21 0842  BP: (!)  115/56 (!) 101/57  112/62  Pulse: 66 65 88 83  Resp: '15 16  20  '$ Temp: 98.7 F (37.1 C) 98.1 F (36.7 C)    TempSrc: Oral Oral    SpO2: 99% 95%  98%  Weight:  81.4 kg      Intake/Output Summary (Last 24 hours) at 09/22/2021 1055 Last data filed at 09/22/2021 0800 Gross per 24 hour  Intake 363 ml  Output 2500 ml  Net -2137 ml     Wt Readings from Last 3 Encounters:  09/22/21 81.4 kg  09/03/21 86 kg  08/30/21 91.9 kg     Exam General: Alert and oriented x 3, NAD Cardiovascular: S1 S2 auscultated,  RRR Respiratory: Clear to auscultation bilaterally, no wheezing Gastrointestinal: Soft, nontender, nondistended, + bowel sounds Ext: no pedal edema bilaterally Neuro: Strength 5/5 upper and lower extremities bilaterally Skin: No rashes    Data Reviewed:  I have personally reviewed following labs    CBC Lab Results  Component Value Date   WBC 6.0 09/20/2021   RBC 3.17 (L) 09/20/2021   HGB 10.0 (L) 09/20/2021   HCT 30.3 (L) 09/20/2021   MCV 95.6 09/20/2021   MCH 31.5 09/20/2021   PLT 116 (L) 09/20/2021   MCHC 33.0 09/20/2021   RDW 14.4 09/20/2021   LYMPHSABS 0.8 09/18/2021   MONOABS 0.3 09/18/2021   EOSABS 0.1 09/18/2021   BASOSABS 0.0 01/60/1093     Last metabolic panel Lab Results  Component Value Date   NA 139 09/22/2021   K 3.7 09/22/2021   CL 105 09/22/2021   CO2 23 09/22/2021   BUN 60 (H) 09/22/2021   CREATININE 3.33 (H) 09/22/2021   GLUCOSE 88 09/22/2021   GFRNONAA 18 (L) 09/22/2021   GFRAA 38 (L) 11/18/2019   CALCIUM 8.8 (L) 09/22/2021   PHOS 2.6 02/24/2021   PROT 5.7 (L) 09/18/2021   ALBUMIN 3.0 (L) 09/18/2021   BILITOT 1.1 09/18/2021   ALKPHOS 110 09/18/2021   AST 20 09/18/2021   ALT 26 09/18/2021   ANIONGAP 11 09/22/2021    CBG (last 3)  Recent Labs    09/21/21 1610 09/21/21 2113 09/22/21 0605  GLUCAP 92 113* 87         [name] M.D. [facility] 09/22/2021, 10:55 AM  Available via Epic secure chat 7am-7pm After 7  pm, please refer to night coverage provider listed on amion.

## 2021-09-22 NOTE — TOC Progression Note (Signed)
Transition of Care [facility]) - Progression Note    Patient Details  Name: LEEON MAKAR MRN: [id number] Date of Birth: 1935/12/06  Transition of Care [facility]) CM/SW Contact  Emeterio Reeve, [place] Phone Number: 09/22/2021, 10:20 AM  Clinical Narrative:     Pts insurance auth for SNF is still pending.   Expected Discharge Plan: [place] Barriers to Discharge: Continued Medical Work up, Ship broker  Expected Discharge Plan and Services Expected Discharge Plan: [place] arrangements for the past 2 months: [place] Determinants of Health (SDOH) Interventions    Readmission Risk Interventions     No data to display         Emeterio Reeve, LCSW Clinical Social Worker

## 2021-09-22 NOTE — Progress Notes (Signed)
Subjective:  Feels okay Denies any significant dyspnea  Objective:  Vital Signs in the last 24 hours: Temp:  [98.1 F (36.7 C)-98.7 F (37.1 C)] 98.4 F (36.9 C) (07/01 1136) Pulse Rate:  [65-119] 88 (07/01 1136) Resp:  [15-20] 18 (07/01 1136) BP: (101-115)/(55-62) 104/56 (07/01 1136) SpO2:  [95 %-99 %] 97 % (07/01 1136) Weight:  [81.4 kg] 81.4 kg (07/01 0331)  Intake/Output from previous day: 06/30 0701 - 07/01 0700 In: 243 [P.O.:240; I.V.:3] Out: 2250 [Urine:2250]  Physical Exam Vitals and nursing note reviewed.  Constitutional:      General: He is not in acute distress. Neck:     Vascular: JVD present.  Cardiovascular:     Rate and Rhythm: Normal rate. Rhythm irregular.     Heart sounds: Normal heart sounds. No murmur heard. Pulmonary:     Effort: Pulmonary effort is normal.     Breath sounds: Normal breath sounds. No wheezing or rales.  Musculoskeletal:     Right lower leg: Edema (2+) present.     Left lower leg: Edema (2+) present.      Imaging/tests reviewed and independently interpreted: Telemetry: Rate controlled Afib  Cardiac Studies:  EKG 09/18/2021: Afib 67 bpm   VQ scan 09/19/2021: Imaging findings suggest low probability for pulmonary embolism.     Echocardiogram 08/31/2021:  1. Left ventricular ejection fraction, by estimation, is 45 to 50%. The  left ventricle has mildly decreased function. The left ventricle  demonstrates global hypokinesis. There is mild left ventricular  hypertrophy. Left ventricular diastolic parameters are indeterminate.   2. Right ventricular systolic function is mildly reduced. The right  ventricular size is normal. There is severely elevated pulmonary artery  systolic pressure. The estimated right ventricular systolic pressure is  24.2 mmHg.   3. Left atrial size was severely dilated.   4. Right atrial size was moderately dilated.   5. The mitral valve is normal in structure. Mild to moderate mitral valve   regurgitation. No evidence of mitral stenosis.   6. The aortic valve is tricuspid. There is mild calcification of the  aortic valve. Aortic valve regurgitation is not visualized. No aortic  stenosis is present.   7. The inferior vena cava is dilated in size with <50% respiratory  variability, suggesting right atrial pressure of 15 mmHg.   8. The patient is in atrial fibrillation.    Assessment & Recommendations:  86 y.o. 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 Recommend resuming IV lasix 40 mg bid. Continue Bidil. Would avoid sildenafil given use of Bidil.   Persistent A-fib: Rate controlled.  High CHA2DS2-VASc 4.  Continue Eliquis 2.5 mg twice daily.   Trop elevation: Secondary to above   Long term prognosis is guarded. Agree with ongoing palliative care discussion.      [name], MD Pager: [phone] Office: [phone]

## 2021-09-23 LAB — BASIC METABOLIC PANEL
Anion gap: 15 (ref 5–15)
BUN: 62 mg/dL — ABNORMAL HIGH (ref 8–23)
CO2: 23 mmol/L (ref 22–32)
Calcium: 9.4 mg/dL (ref 8.9–10.3)
Chloride: 103 mmol/L (ref 98–111)
Creatinine, Ser: 3.57 mg/dL — ABNORMAL HIGH (ref 0.61–1.24)
GFR, Estimated: 16 mL/min — ABNORMAL LOW (ref >60)
Glucose, Bld: 92 mg/dL (ref 70–99)
Potassium: 3.7 mmol/L (ref 3.5–5.1)
Sodium: 141 mmol/L (ref 135–145)

## 2021-09-23 LAB — GLUCOSE, CAPILLARY
Glucose-Capillary: 95 mg/dL (ref 70–99)
Glucose-Capillary: 98 mg/dL (ref 70–99)
Glucose-Capillary: 91 mg/dL (ref 70–99)
Glucose-Capillary: 137 mg/dL — ABNORMAL HIGH (ref 70–99)

## 2021-09-23 LAB — CULTURE, BLOOD (ROUTINE X 2)
Culture: NO GROWTH
Culture: NO GROWTH
Special Requests: ADEQUATE
Special Requests: ADEQUATE

## 2021-09-23 NOTE — Progress Notes (Signed)
Mobility Specialist Progress Note    09/23/21 1211  Mobility  Activity Dangled on edge of bed  Level of Assistance Moderate assist, patient does 50-74%  Assistive Device Other (Comment) (HHA)  Activity Response Tolerated fair  $Mobility charge 1 Mobility   Pre-Mobility: 88 HR, 98% SpO2 During Mobility: 138/68 BP sitting Post-Mobility: 90 HR, 100% SpO2  Pt received in bed and agreeable to get up to chair. Once sitting EOB, attempted x2 to stand with no success. C/o some lightheadedness. Sat EOB ~5 minutes. Returned to bed with call bell in reach and alarm on.   [facility] Mobility Specialist

## 2021-09-23 NOTE — TOC Progression Note (Signed)
Transition of Care [facility]) - Progression Note    Patient Details  Name: Roy [name] MRN: [id number] Date of Birth: 06/07/1935  Transition of Care [facility]) CM/SW [place], LCSW Phone Number: 09/23/2021, 11:05 AM  Clinical Narrative:     CSW called [facility] and spoke with Colletta [place] and she explained that pt's authorization was still pending. CSW inquired how long it would take and she was unsure. CSW verified that all needed clinicals were received and CSW was informed that they had all needed documents.  TOC team will continue to assist with discharge planning needs.     Expected Discharge Plan: [place] Barriers to Discharge: Continued Medical Work up, Ship broker  Expected Discharge Plan and Services Expected Discharge Plan: [place]       Living arrangements for the past 2 months: Single Family Home                                       Social Determinants of Health (SDOH) Interventions    Readmission Risk Interventions     No data to display

## 2021-09-23 NOTE — Progress Notes (Signed)
[facility]                                                                              Roy [name], is a 86 y.o. male, DOB - [birth year], [id number] Admit date - 09/18/2021    Outpatient Primary MD for the patient is Rogers Blocker, MD  LOS - 5  days  Chief Complaint  Patient presents with   Hypotension       Brief summary   86 year old gentleman with history of cognitive dysfunction, chronic atrial fibrillation on Eliquis, CKD stage IV, history of DVT, sleep apnea, hypertension lives alone at home brought to ER for evaluation of shortness of breath, low blood pressures and low heart rate and more confusion found when he went to PCP office.  Patient lives alone.  His son helps him to go to doctors.  He took him to routine checkup yesterday, where he was found with blood pressure 96 and heart rate in 40s and patient looked confused and weak so sent to ER for work-up.  In the emergency room he was given 250 mL of saline before arrival to ER and he was hemodynamically stable since then. Patient was on room air.  EKG with A-fib with heart rate 63.  CT head was normal.  BUN and creatinine 63/3.04 at about his levels.  Spurious platelet counts that ultimately showed adequate.  Lactic acid normal.  D-dimer 2.2.  Blood cultures were collected, he was given 1 dose of IV Lasix.  Admitted to hospital due to significant symptoms. Remains in the hospital awaiting insurance authorization for SNF.     Assessment & Plan    Principal Problem:   Acute on chronic diastolic CHF (congestive heart failure) (HCC) -Presented with significant bilateral peripheral edema, shortness of breath, difficulty compliance with medications at home -No ACE/ARB/ARNI with CKD, advanced and worsening renal function , continue BiDil -Cardiology following, continue Lasix 40 mg IV every 12 hours, strict I's and O's -Negative balance of 5.6 L -Weight on admission 185.6lbs -> 181lbs today   Acute kidney  injury on CKD stage IV -Baseline creatinine has been between 2.8-3, most recently in 3.3 range, likely his baseline -Creatinine trended up to 3.5, likely due to Lasix  Elevated D-dimer, prior history of DVT and noncompliance to eliquis -Continue Eliquis 2.5 mg twice daily -Angiogram not possible due to advanced CKD, VQ scan showed low probability of PE   Chronic atrial fibrillation -Rate controlled, continue eliquis   Generalized debility, confusion, cognitive decline -No new FND's, CT head unremarkable, continue PT OT fall precautions -Palliative care consulted, will need SNF   Diabetes mellitus type 2, NIDDM -Diet controlled at home, well controlled, hemoglobin A1c 5.4 on 09/19/2021 Recent Labs    09/22/21 0605 09/22/21 1119 09/22/21 1641 09/22/21 2132 09/23/21 0614 09/23/21 1117  GLUCAP 87 97 96 123* 95 98      Code Status: Full code DVT Prophylaxis:  Place TED hose Start: 09/21/21 1825 apixaban (ELIQUIS) tablet 2.5 mg Start: 09/19/21 1000 apixaban (ELIQUIS) tablet 2.5 mg   Level of Care: Level of care: Telemetry Cardiac Family Communication:  Disposition Plan:  Remains inpatient appropriate: Awaiting skilled nursing facility   Procedures:  None  Consultants:   Cardiology  Antimicrobials:     Medications  apixaban  2.5 mg Oral BID   cinacalcet  30 mg Oral Q breakfast   diltiazem  240 mg Oral Daily   finasteride  5 mg Oral Daily   fluticasone furoate-vilanterol  1 puff Inhalation Daily   And   umeclidinium bromide  1 puff Inhalation Daily   furosemide  40 mg Intravenous BID   insulin aspart  0-6 Units Subcutaneous TID WC   isosorbide-hydrALAZINE  1 tablet Oral TID   pantoprazole  40 mg Oral Daily   sodium chloride flush  3 mL Intravenous Q12H   tamsulosin  0.4 mg Oral Daily      Subjective:   Roy [name] was seen and examined today.  No acute complaints, appears comfortable, poor historian.    Objective:   Vitals:   09/23/21 0429  09/23/21 0738 09/23/21 0739 09/23/21 0801  BP: 120/66   126/69  Pulse: 80   65  Resp: 19   19  Temp: 97.7 F (36.5 C)   (!) 97.5 F (36.4 C)  TempSrc: Oral   Oral  SpO2: 98% 98% 100% 100%  Weight: 82.4 kg       Intake/Output Summary (Last 24 hours) at 09/23/2021 1222 Last data filed at 09/23/2021 0815 Gross per 24 hour  Intake 3 ml  Output 1050 ml  Net -1047 ml     Wt Readings from Last 3 Encounters:  09/23/21 82.4 kg  09/03/21 86 kg  08/30/21 91.9 kg   Physical Exam General: Alert and oriented , NAD Cardiovascular: Irregular Respiratory: CTAB, no wheezing, rales or rhonchi Gastrointestinal: Soft, nontender, nondistended, NBS Ext: 2+ pedal edema bilaterally Neuro: no new deficits Psych: appears comfortable     Data Reviewed:  I have personally reviewed following labs    CBC Lab Results  Component Value Date   WBC 6.0 09/20/2021   RBC 3.17 (L) 09/20/2021   HGB 10.0 (L) 09/20/2021   HCT 30.3 (L) 09/20/2021   MCV 95.6 09/20/2021   MCH 31.5 09/20/2021   PLT 116 (L) 09/20/2021   MCHC 33.0 09/20/2021   RDW 14.4 09/20/2021   LYMPHSABS 0.8 09/18/2021   MONOABS 0.3 09/18/2021   EOSABS 0.1 09/18/2021   BASOSABS 0.0 28/31/5176     Last metabolic panel Lab Results  Component Value Date   NA 141 09/23/2021   K 3.7 09/23/2021   CL 103 09/23/2021   CO2 23 09/23/2021   BUN 62 (H) 09/23/2021   CREATININE 3.57 (H) 09/23/2021   GLUCOSE 92 09/23/2021   GFRNONAA 16 (L) 09/23/2021   GFRAA 38 (L) 11/18/2019   CALCIUM 9.4 09/23/2021   PHOS 2.6 02/24/2021   PROT 5.7 (L) 09/18/2021   ALBUMIN 3.0 (L) 09/18/2021   BILITOT 1.1 09/18/2021   ALKPHOS 110 09/18/2021   AST 20 09/18/2021   ALT 26 09/18/2021   ANIONGAP 15 09/23/2021    CBG (last 3)  Recent Labs    09/22/21 2132 09/23/21 0614 09/23/21 1117  GLUCAP 123* 95 98         Roy [name] M.D. [facility] 09/23/2021, 12:22 PM  Available via Epic secure chat 7am-7pm After 7 pm, please refer to  night coverage provider listed on amion.

## 2021-09-23 NOTE — Progress Notes (Signed)
Subjective:  Feels okay Denies any significant dyspnea  Objective:  Vital Signs in the last 24 hours: Temp:  [97.5 F (36.4 C)-98.6 F (37 C)] 97.5 F (36.4 C) (07/02 0801) Pulse Rate:  [65-86] 65 (07/02 0801) Resp:  [19-20] 19 (07/02 0801) BP: (120-130)/(60-69) 126/69 (07/02 0801) SpO2:  [92 %-100 %] 100 % (07/02 0801) Weight:  [82.4 kg] 82.4 kg (07/02 0429)  Intake/Output from previous day: 07/01 0701 - 07/02 0700 In: 359 [P.O.:356; I.V.:3] Out: 1050 [Urine:1050]  Physical Exam Vitals and nursing note reviewed.  Constitutional:      General: He is not in acute distress. Neck:     Vascular: JVD present.  Cardiovascular:     Rate and Rhythm: Normal rate. Rhythm irregular.     Heart sounds: Normal heart sounds. No murmur heard. Pulmonary:     Effort: Pulmonary effort is normal.     Breath sounds: Normal breath sounds. No wheezing or rales.  Musculoskeletal:     Right lower leg: Edema (1+) present.     Left lower leg: Edema (1+) present.      Imaging/tests reviewed and independently interpreted: Telemetry: Afib with intermittent episodes of RVR  Cardiac Studies:  EKG 09/18/2021: Afib 67 bpm   VQ scan 09/19/2021: Imaging findings suggest low probability for pulmonary embolism.     Echocardiogram 08/31/2021:  1. Left ventricular ejection fraction, by estimation, is 45 to 50%. The  left ventricle has mildly decreased function. The left ventricle  demonstrates global hypokinesis. There is mild left ventricular  hypertrophy. Left ventricular diastolic parameters are indeterminate.   2. Right ventricular systolic function is mildly reduced. The right  ventricular size is normal. There is severely elevated pulmonary artery  systolic pressure. The estimated right ventricular systolic pressure is  09.2 mmHg.   3. Left atrial size was severely dilated.   4. Right atrial size was moderately dilated.   5. The mitral valve is normal in structure. Mild to moderate mitral  valve  regurgitation. No evidence of mitral stenosis.   6. The aortic valve is tricuspid. There is mild calcification of the  aortic valve. Aortic valve regurgitation is not visualized. No aortic  stenosis is present.   7. The inferior vena cava is dilated in size with <50% respiratory  variability, suggesting right atrial pressure of 15 mmHg.   8. The patient is in atrial fibrillation.    Assessment & Recommendations:  86 y.o. 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 Given continued increase in creatinine, and relatively improved volume status, recommend holding diuretic for rest of the day today.  Will reassess volume status and renal function tomorrow. Continue Bidil.   Persistent A-fib: Rate controlled.  High CHA2DS2-VASc 4.  Continue Eliquis 2.5 mg twice daily.   Trop elevation: Secondary to above   Long term prognosis is guarded. Agree with ongoing palliative care discussion.      [name], MD Pager: [phone] Office: [phone]

## 2021-09-24 LAB — GLUCOSE, CAPILLARY
Glucose-Capillary: 105 mg/dL — ABNORMAL HIGH (ref 70–99)
Glucose-Capillary: 96 mg/dL (ref 70–99)
Glucose-Capillary: 106 mg/dL — ABNORMAL HIGH (ref 70–99)
Glucose-Capillary: 134 mg/dL — ABNORMAL HIGH (ref 70–99)

## 2021-09-24 LAB — BASIC METABOLIC PANEL
Anion gap: 11 (ref 5–15)
BUN: 63 mg/dL — ABNORMAL HIGH (ref 8–23)
CO2: 23 mmol/L (ref 22–32)
Calcium: 9.3 mg/dL (ref 8.9–10.3)
Chloride: 107 mmol/L (ref 98–111)
Creatinine, Ser: 3.54 mg/dL — ABNORMAL HIGH (ref 0.61–1.24)
GFR, Estimated: 16 mL/min — ABNORMAL LOW (ref >60)
Glucose, Bld: 85 mg/dL (ref 70–99)
Potassium: 3.6 mmol/L (ref 3.5–5.1)
Sodium: 141 mmol/L (ref 135–145)

## 2021-09-24 MED ORDER — ISOSORB DINITRATE-HYDRALAZINE 20-37.5 MG PO TABS: 1.0000 | ORAL_TABLET | Freq: Three times a day (TID) | ORAL | Status: AC

## 2021-09-24 MED ORDER — FUROSEMIDE 40 MG PO TABS: 40.0000 mg | ORAL_TABLET | Freq: Two times a day (BID) | ORAL | Status: DC

## 2021-09-24 NOTE — Progress Notes (Signed)
Pt. Discharged to SNF. Pt. Alert and in stable condition at time of d/c. Pt. Transported by [facility].

## 2021-09-24 NOTE — Discharge Summary (Signed)
Physician Discharge Summary   Patient: Roy [name] MRN: [id number] DOB: 04-Jan-1936  Admit date:     09/18/2021  Discharge date: 09/24/21  Discharge Physician: Estill Cotta, MD    PCP: Rogers Blocker, MD   Recommendations at discharge:   Resume Lasix 40 mg p.o. twice daily tomorrow on 7/4 Repeat labs, renal function in 2 days to follow potassium, creatinine function Recommend palliative support at skilled nursing facility  Discharge Diagnoses:    Acute on chronic diastolic CHF (congestive heart failure) (HCC)   DM2 (diabetes mellitus, type 2) (HCC)   OSA on CPAP   Uncontrolled hypertension   Chronic obstructive airway disease with asthma ([place])   CKD (chronic kidney disease), stage IV ([place])   Other secondary pulmonary hypertension (HCC)   Persistent atrial fibrillation (HCC)   Elevated troponin   Thrombocytopenia [facility])    Hospital Course:  86 year old gentleman with history of cognitive dysfunction, chronic atrial fibrillation on Eliquis, CKD stage IV, history of DVT, sleep apnea, hypertension lives alone at home brought to ER for evaluation of shortness of breath, low blood pressures and low heart rate and more confusion found when he went to PCP office.  Patient lives alone.  His son helps him to go to doctors.  He took him to routine checkup yesterday, where he was found with blood pressure 96 and heart rate in 40s and patient looked confused and weak so sent to ER for work-up.  In the emergency room he was given 250 mL of saline before arrival to ER and he was hemodynamically stable since then. Patient was on room air.  EKG with A-fib with heart rate 63.  CT head was normal.  BUN and creatinine 63/3.04 at about his levels.  Spurious platelet counts that ultimately showed adequate.  Lactic acid normal.  D-dimer 2.2.  Blood cultures were collected, he was given 1 dose of IV Lasix.  Admitted to hospital due to significant symptoms.    Assessment and Plan:   Acute on chronic  diastolic CHF (congestive heart failure) (HCC) -Presented with significant bilateral peripheral edema, shortness of breath, difficulty compliance with medications at home -No ACE/ARB/ARNI with CKD, advanced and worsening renal function, started on BiDil.  Patient was placed on IV Lasix for diuresis.  Cardiology was consulted. -Negative balance of 6.9 L, weight down from 185.6lbs on admission to 179 at the time of discharge -Creatinine function has slowly trended up, hence IV Lasix on hold.  Patient can resume oral Lasix 40 mg p.o. twice daily tomorrow on 7/4 -Follow renal function closely.  Cleared by cardiology to be discharged.     Acute kidney injury on CKD stage IV -Baseline creatinine has been between 2.8-3, most recently in 3.3 range, likely his new baseline his b -IV Lasix discontinued, patient can resume oral Lasix tomorrow on 7/4   Elevated D-dimer, prior history of DVT and noncompliance to eliquis -Continue Eliquis 2.5 mg twice daily -Angiogram not possible due to advanced CKD, VQ scan showed low probability of PE    Chronic atrial fibrillation -Rate controlled, continue Cardizem - continue eliquis     Generalized debility, confusion, cognitive decline -No new FND's, CT head unremarkable, continue PT OT fall precautions -Palliative care consulted   Diabetes mellitus type 2, NIDDM -Diet controlled at home, well controlled, hemoglobin A1c 5.4 on 09/19/2021     Pain control - [facility] Controlled Substance Reporting System database was reviewed. and patient was instructed, not to drive, operate heavy machinery, perform activities  at heights, swimming or participation in water activities or provide baby-sitting services while on Pain, Sleep and Anxiety Medications; until their outpatient Physician has advised to do so again. Also recommended to not to take more than prescribed Pain, Sleep and Anxiety Medications.  Consultants: Cardiology, palliative medicine Procedures  performed: None Disposition: Skilled nursing facility Diet recommendation:  Discharge Diet Orders (From admission, onward)     Start     Ordered   09/24/21 0000  Diet - low sodium heart healthy        09/24/21 1405            DISCHARGE MEDICATION: Allergies as of 09/24/2021   No Known Allergies      Medication List     STOP taking these medications    isosorbide dinitrate 30 MG tablet Commonly known as: ISORDIL   labetalol 100 MG tablet Commonly known as: NORMODYNE   torsemide 100 MG tablet Commonly known as: DEMADEX       TAKE these medications    cinacalcet 30 MG tablet Commonly known as: SENSIPAR Take 30 mg by mouth daily with supper.   Contour Next Test test strip Generic drug: glucose blood daily.   diltiazem 240 MG 24 hr capsule Commonly known as: CARDIZEM CD Take 240 mg by mouth daily.   Eliquis 2.5 MG Tabs tablet Generic drug: apixaban Take 2.5 mg by mouth 2 (two) times daily.   finasteride 5 MG tablet Commonly known as: PROSCAR Take 5 mg by mouth daily.   furosemide 40 MG tablet Commonly known as: Lasix Take 1 tablet (40 mg total) by mouth 2 (two) times daily. Start taking on: September 25, 2021   gabapentin 100 MG capsule Commonly known as: NEURONTIN Take 100 mg by mouth at bedtime.   isosorbide-hydrALAZINE 20-37.5 MG tablet Commonly known as: BIDIL Take 1 tablet by mouth 3 (three) times daily.   Microlet Lancets Misc   pantoprazole 40 MG tablet Commonly known as: PROTONIX Take 40 mg by mouth daily.   potassium chloride SA 20 MEQ tablet Commonly known as: KLOR-CON M Take 20 mEq by mouth daily.   tamsulosin 0.4 MG Caps capsule Commonly known as: FLOMAX Take 1 capsule (0.4 mg total) by mouth daily. What changed: when to take this   Trelegy Ellipta 100-62.5-25 MCG/ACT Aepb Generic drug: Fluticasone-Umeclidin-Vilant Inhale 1 puff into the lungs daily.        Follow-up Information     Rogers Blocker, MD. Schedule an  appointment as soon as possible for a visit in 2 week(s).   Specialty: Internal Medicine Why: for hospital follow-up Contact information: [place] [address] [phone]         [name], MD. Schedule an appointment as soon as possible for a visit in 2 week(s).   Specialties: Cardiology, Radiology Why: for hospital follow-up, obtain labs renal panel Contact information: [place] [address] [phone]                Discharge Exam: Danley Danker Weights   09/22/21 0331 09/23/21 0429 09/24/21 0416  Weight: 81.4 kg 82.4 kg 81.6 kg   S: No acute complaints, denies any significant shortness of breath.  No chest pain, nausea vomiting, abdominal pain.  Vitals:   09/24/21 0400 09/24/21 0416 09/24/21 0804 09/24/21 1123  BP: 132/78   (!) 141/70  Pulse: 61   89  Resp: 18   20  Temp: 98 F (36.7 C)  97.6 F (36.4 C)  TempSrc: Oral   Oral  SpO2: 93%  94% 97%  Weight:  81.6 kg      Physical Exam General: Alert and oriented x, appears comfortable, NAD Cardiovascular: Irregularly irregular Respiratory: CTAB, no wheezing, rales or rhonchi Gastrointestinal: Soft, nontender, nondistended, NBS Ext: 1+ pedal edema bilaterally Neuro: no new deficits   Condition at discharge: fair  The results of significant diagnostics from this hospitalization (including imaging, microbiology, ancillary and laboratory) are listed below for reference.   Imaging Studies: NM Pulmonary Perfusion  Result Date: 09/19/2021 CLINICAL DATA:  Positive D-dimer, clinical suspicion of pulmonary embolism EXAM: NUCLEAR MEDICINE PERFUSION LUNG SCAN TECHNIQUE: Perfusion images were obtained in multiple projections after intravenous injection of radiopharmaceutical. Ventilation scans intentionally deferred if perfusion scan and chest x-ray adequate for interpretation during COVID 19 epidemic. RADIOPHARMACEUTICALS:  4.1 mCi Tc-62mMAA IV  COMPARISON:  Chest radiograph done on 09/19/2018 FINDINGS: There are no wedge-shaped perfusion defects. There is subtle decrease in activity in the posterior mid and lower lung fields. IMPRESSION: Imaging findings suggest low probability for pulmonary embolism. Electronically Signed   By: PElmer PickerM.D.   On: 09/19/2021 12:53   CT HEAD WO CONTRAST (5MM)  Result Date: 09/18/2021 CLINICAL DATA:  Mental status change, unknown cause EXAM: CT HEAD WITHOUT CONTRAST TECHNIQUE: Contiguous axial images were obtained from the base of the skull through the vertex without intravenous contrast. RADIATION DOSE REDUCTION: This exam was performed according to the departmental dose-optimization program which includes automated exposure control, adjustment of the mA and/or kV according to patient size and/or use of iterative reconstruction technique. COMPARISON:  CT head 02/20/2021 BRAIN: BRAIN Cerebral ventricle sizes are concordant with the degree of cerebral volume loss. Patchy and confluent areas of decreased attenuation are noted throughout the deep and periventricular white matter of the cerebral hemispheres bilaterally, compatible with chronic microvascular ischemic disease. No evidence of large-territorial acute infarction. No parenchymal hemorrhage. No mass lesion. No extra-axial collection. No mass effect or midline shift. No hydrocephalus. Basilar cisterns are patent. Vascular: No hyperdense vessel. Atherosclerotic calcifications are present within the cavernous internal carotid arteries. Skull: No acute fracture or focal lesion. Sinuses/Orbits: Paranasal sinuses and mastoid air cells are clear. The orbits are unremarkable. Other: None. IMPRESSION: No acute intracranial abnormality. Electronically Signed   By: MIven FinnM.D.   On: 09/18/2021 21:40   DG Chest Portable 1 View  Result Date: 09/18/2021 CLINICAL DATA:  Hypotension chest pain EXAM: PORTABLE CHEST 1 VIEW COMPARISON:  08/30/2021, 10/07/2015  FINDINGS: Cardiomegaly with vascular congestion and mild diffuse interstitial opacities suspicious for mild pulmonary edema. No pleural effusion or pneumothorax. Mild ground-glass opacity at the bases. Aortic atherosclerosis. IMPRESSION: 1. Cardiomegaly with vascular congestion and mild interstitial edema 2. Hazy lung base opacities could reflect atelectasis, edema or mild pneumonia Electronically Signed   By: KDonavan FoilM.D.   On: 09/18/2021 17:50   ECHOCARDIOGRAM COMPLETE  Result Date: 08/31/2021    ECHOCARDIOGRAM REPORT   Patient Name:   Roy LEVANDate of Exam: 08/31/2021 Medical Rec #:  [id number]        Height:       70.0 in Accession #:    [id number]       Weight:       202.6 lb Date of Birth:  608-13-1937        BSA:          2.099 m Patient Age:    89years  BP:           121/84 mmHg Patient Gender: M                 HR:           72 bpm. Exam Location:  Inpatient Procedure: 2D Echo, Cardiac Doppler and Color Doppler Indications:    Congestive heart failure  History:        Patient has prior history of Echocardiogram examinations, most                 recent 02/21/2021. COPD and Pulmonary HTN, Arrythmias:Atrial                 Fibrillation; Risk Factors:Diabetes and Hypertension.  Sonographer:    Joette Catching RCS Referring Phys: [id number] [place]  1. Left ventricular ejection fraction, by estimation, is 45 to 50%. The left ventricle has mildly decreased function. The left ventricle demonstrates global hypokinesis. There is mild left ventricular hypertrophy. Left ventricular diastolic parameters are indeterminate.  2. Right ventricular systolic function is mildly reduced. The right ventricular size is normal. There is severely elevated pulmonary artery systolic pressure. The estimated right ventricular systolic pressure is 32.2 mmHg.  3. Left atrial size was severely dilated.  4. Right atrial size was moderately dilated.  5. The mitral valve is normal in structure.  Mild to moderate mitral valve regurgitation. No evidence of mitral stenosis.  6. The aortic valve is tricuspid. There is mild calcification of the aortic valve. Aortic valve regurgitation is not visualized. No aortic stenosis is present.  7. The inferior vena cava is dilated in size with <50% respiratory variability, suggesting right atrial pressure of 15 mmHg.  8. The patient is in atrial fibrillation. FINDINGS  Left Ventricle: Left ventricular ejection fraction, by estimation, is 45 to 50%. The left ventricle has mildly decreased function. The left ventricle demonstrates global hypokinesis. The left ventricular internal cavity size was normal in size. There is  mild left ventricular hypertrophy. Left ventricular diastolic parameters are indeterminate. Right Ventricle: The right ventricular size is normal. No increase in right ventricular wall thickness. Right ventricular systolic function is mildly reduced. There is severely elevated pulmonary artery systolic pressure. The tricuspid regurgitant velocity is 4.15 m/s, and with an assumed right atrial pressure of 15 mmHg, the estimated right ventricular systolic pressure is 02.5 mmHg. Left Atrium: Left atrial size was severely dilated. Right Atrium: Right atrial size was moderately dilated. Pericardium: There is no evidence of pericardial effusion. Mitral Valve: The mitral valve is normal in structure. Mild mitral annular calcification. Mild to moderate mitral valve regurgitation. No evidence of mitral valve stenosis. Tricuspid Valve: The tricuspid valve is normal in structure. Tricuspid valve regurgitation is mild. Aortic Valve: The aortic valve is tricuspid. There is mild calcification of the aortic valve. Aortic valve regurgitation is not visualized. No aortic stenosis is present. Aortic valve mean gradient measures 3.0 mmHg. Aortic valve peak gradient measures 5.6 mmHg. Aortic valve area, by VTI measures 1.29 cm. Pulmonic Valve: The pulmonic valve was normal in  structure. Pulmonic valve regurgitation is trivial. Aorta: The aortic root is normal in size and structure. Venous: The inferior vena cava is dilated in size with less than 50% respiratory variability, suggesting right atrial pressure of 15 mmHg. IAS/Shunts: No atrial level shunt detected by color flow Doppler.  LEFT VENTRICLE PLAX 2D LVIDd:         3.80 cm     Diastology LVIDs:  3.00 cm     LV e' medial:    7.61 cm/s LV PW:         1.20 cm     LV E/e' medial:  13.9 LV IVS:        1.30 cm     LV e' lateral:   9.57 cm/s LVOT diam:     1.80 cm     LV E/e' lateral: 11.1 LV SV:         32 LV SV Index:   15 LVOT Area:     2.54 cm  LV Volumes (MOD) LV vol d, MOD A2C: 94.9 ml LV vol d, MOD A4C: 83.7 ml LV vol s, MOD A2C: 48.9 ml LV vol s, MOD A4C: 45.4 ml LV SV MOD A2C:     46.0 ml LV SV MOD A4C:     83.7 ml LV SV MOD BP:      41.5 ml RIGHT VENTRICLE             IVC RV Basal diam:  3.80 cm     IVC diam: 2.30 cm RV Mid diam:    2.40 cm RV S prime:     15.30 cm/s TAPSE (M-mode): 1.8 cm LEFT ATRIUM              Index        RIGHT ATRIUM           Index LA diam:        5.00 cm  2.38 cm/m   RA Area:     26.50 cm LA Vol (A2C):   149.0 ml 70.99 ml/m  RA Volume:   93.10 ml  44.36 ml/m LA Vol (A4C):   139.0 ml 66.23 ml/m LA Biplane Vol: 151.0 ml 71.95 ml/m  AORTIC VALVE                    PULMONIC VALVE AV Area (Vmax):    1.55 cm     PV Vmax:          0.90 m/s AV Area (Vmean):   1.41 cm     PV Peak grad:     3.2 mmHg AV Area (VTI):     1.29 cm     PR End Diast Vel: 5.43 msec AV Vmax:           118.00 cm/s AV Vmean:          84.400 cm/s AV VTI:            0.248 m AV Peak Grad:      5.6 mmHg AV Mean Grad:      3.0 mmHg LVOT Vmax:         71.70 cm/s LVOT Vmean:        46.600 cm/s LVOT VTI:          0.126 m LVOT/AV VTI ratio: 0.51  AORTA Ao Root diam: 3.10 cm Ao Asc diam:  3.10 cm MITRAL VALVE                    TRICUSPID VALVE MV Area (PHT): 7.99 cm         TR Peak grad:   68.9 mmHg MV Decel Time: 95 msec          TR  Vmax:        415.00 cm/s MR Peak grad:      136.4 mmHg MR Mean grad:      91.0 mmHg  SHUNTS MR Vmax:           584.00 cm/s  Systemic VTI:  0.13 m MR Vmean:          454.0 cm/s   Systemic Diam: 1.80 cm MR Vena Contracta: 0.30 cm MR PISA:           1.01 cm MR PISA Eff ROA:   6 mm MR PISA Radius:    0.40 cm MV E velocity: 106.00 cm/s MV A velocity: 50.30 cm/s MV E/A ratio:  2.11 Dalton McleanMD Electronically signed by Franki Monte Signature Date/Time: 08/31/2021/3:53:57 PM    Final    DG Chest 2 View  Result Date: 08/30/2021 CLINICAL DATA:  Leg swelling.  Shortness of breath EXAM: CHEST - 2 VIEW COMPARISON:  10/07/2015 FINDINGS: reverse apical lordotic frontal positioning. Midline trachea. Normal heart size. No pleural effusion or pneumothorax. Pulmonary interstitial prominence is favored to be due to AP portable technique and low lung volumes. No overt congestive failure. There may be mild subsegmental atelectasis at the lung bases. IMPRESSION: Low lung volumes, without convincing evidence of acute cardiopulmonary disease. Limited by reverse apical lordotic positioning AP portable technique. Electronically Signed   By: Abigail Miyamoto M.D.   On: 08/30/2021 19:37    Microbiology: Results for orders placed or performed during the hospital encounter of 09/18/21  Culture, blood (routine x 2)     Status: None   Collection Time: 09/18/21 12:05 PM   Specimen: BLOOD  Result Value Ref Range Status   Specimen Description BLOOD RIGHT UPPER ARM  Final   Special Requests   Final    BOTTLES DRAWN AEROBIC AND ANAEROBIC Blood Culture adequate volume   Culture   Final    NO GROWTH 5 DAYS Performed at [facility], [place] [address], [place], [place] [address]    Report Status 09/23/2021 FINAL  Final  Culture, blood (routine x 2)     Status: None   Collection Time: 09/18/21  5:57 PM   Specimen: BLOOD  Result Value Ref Range Status   Specimen Description BLOOD BLOOD RIGHT FOREARM  Final   Special Requests    Final    BOTTLES DRAWN AEROBIC AND ANAEROBIC Blood Culture adequate volume   Culture   Final    NO GROWTH 5 DAYS Performed at [facility], [place] [address], [place], [place] [address]    Report Status 09/23/2021 FINAL  Final  Urine Culture     Status: None   Collection Time: 09/19/21 12:36 AM   Specimen: Urine, Clean Catch  Result Value Ref Range Status   Specimen Description URINE, CLEAN CATCH  Final   Special Requests NONE  Final   Culture   Final    NO GROWTH Performed at [facility], [place] [address] [place], [place] [address]    Report Status 09/19/2021 FINAL  Final    Labs: CBC: Recent Labs  Lab 09/18/21 1805 09/19/21 0448 09/20/21 0625  WBC 4.4 4.8 6.0  NEUTROABS 3.3  --   --   HGB 10.1* 9.9* 10.0*  HCT 31.0* 30.5* 30.3*  MCV 98.4 97.4 95.6  PLT 113* 111* 563*   Basic Metabolic Panel: Recent Labs  Lab 09/19/21 0448 09/20/21 0625 09/21/21 0249 09/22/21 0249 09/23/21 0659 09/24/21 0743  NA 143 140 138 139 141 141  K 4.0 3.8 3.6 3.7 3.7 3.6  CL 106 105 104 105 103 107  CO2 '25 26 26 23 23 23  '$ GLUCOSE 78 90  103* 88 92 85  BUN 61* 61* 60* 60* 62* 63*  CREATININE 3.11* 3.18* 3.29* 3.33* 3.57* 3.54*  CALCIUM 9.0 8.8* 8.4* 8.8* 9.4 9.3  MG 1.7  --   --   --   --   --    Liver Function Tests: Recent Labs  Lab 09/18/21 1805  AST 20  ALT 26  ALKPHOS 110  BILITOT 1.1  PROT 5.7*  ALBUMIN 3.0*   CBG: Recent Labs  Lab 09/23/21 1117 09/23/21 1645 09/23/21 2208 09/24/21 0607 09/24/21 1123  GLUCAP 98 91 137* 105* 96    Discharge time spent: greater than 30 minutes.  Signed: Estill Cotta, MD [facility] 09/24/2021

## 2021-09-24 NOTE — TOC Transition Note (Signed)
Transition of Care [facility]) - CM/SW Discharge Note   Patient Details  Name: Roy [name] MRN: [id number] Date of Birth: 20-Jul-1935  [facility]) CM/SW Contact:  Bethann Berkshire, [place] Phone Number: 09/24/2021, 2:32 PM   Clinical Narrative:     Patient will DC to: [facility] Anticipated DC date: 09/24/21 Family notified: Son Theatre manager Transport by: Corey Harold   Per MD patient ready for DC to [facility]. RN, patient, patient's family, and facility notified of DC. Discharge Summary and FL2 sent to facility. RN to call report prior to discharge [phone] Room 125 B). DC packet on chart. Ambulance transport requested for patient.   CSW will sign off for now as social work intervention is no longer needed. Please consult [place] again if new needs arise.   Final next level of care: Skilled Nursing Facility Barriers to Discharge: No Barriers Identified   Patient Goals and CMS Choice        Discharge Placement              Patient chooses bed at: [facility] Patient to be transferred to facility by: [facility] Name of family member notified: [name] Patient and family notified of of transfer: 09/24/21  Discharge Plan and Services                                     Social Determinants of Health (SDOH) Interventions     Readmission Risk Interventions     No data to display

## 2021-09-24 NOTE — Progress Notes (Signed)
Heart Failure Stewardship Pharmacist Progress Note   PCP: Rogers Blocker, MD PCP-Cardiologist: None    HPI:  86 yo M with PMH of HF, afb, DVT, asthma/COPD, T2DM, CKD IV, OSA on CPAP, pulmonary HTN, GERD, prior sepsis from UTI, and HTN.  He was last admitted from 6/8-6/12 for acute CHF. He was noncompliant with diet and with taking medications. ECHO 6/9 with LVEF 45-50%, mildly reduced RV, and mild-moderate MR. Discharged on torsemide 50 mg daily (same as PTA dose).   He was scheduled for follow up in [facility] clinic on 6/23 but needed to cancel appt.  He returned to the ED on 6/27 with confusion, hypotension, and bradycardia. Also with LE edema. He is unsure if he was taking PTA torsemide. CXR with cardiomegaly, vascular congestion, and mild interstitial edema.  Ongoing discussions with palliative care.   Current HF Medications: Other: BiDil 1 tablet TID  Prior to admission HF Medications: Diuretic: torsemide 50 mg daily Other: Isordil 30 mg TID  Pertinent Lab Values: Serum creatinine 3.54, BUN 63, Potassium 3.6, Sodium 141, BNP 304.9, A1c 5.4  Vital Signs: Weight: 179 lbs (discharge weight on 6/12 189 lbs) Blood pressure: 130/70s  Heart rate: 60s - aflutter I/O: -1.3L yesterday; net -6.9L  Medication Assistance / Insurance Benefits Check: Does the patient have prescription insurance?  Yes Type of insurance plan: [place]:  Prior to admission outpatient pharmacy: [facility] Is the patient willing to use [facility] pharmacy at discharge? Yes Is the patient willing to transition their outpatient pharmacy to utilize a [facility] outpatient pharmacy?   Pending    Assessment: 1. Acute on chronic diastolic CHF (LVEF 33-83%). NYHA class III symptoms. - Holding lasix given increased creatinine. Keep K>4 and Mag>2. - No ACE/ARB/ARNI, MRA, or SGLT2i with advanced CKD - Continue BiDil 1 tab TID  Plan: 1) Medication changes recommended at  this time: - Agree with changes  2) Patient assistance: - None pending - Possible SNF at discharge  3)  Education  - To be completed prior to discharge  Kerby Nora, PharmD, BCPS Heart Failure Stewardship Pharmacist Phone [phone]

## 2021-09-24 NOTE — Progress Notes (Signed)
Subjective:  Feels okay Denies any significant dyspnea Eating lunch  Objective:  Vital Signs in the last 24 hours: Temp:  [97.6 F (36.4 C)-98.2 F (36.8 C)] 97.6 F (36.4 C) (07/03 1123) Pulse Rate:  [42-90] 89 (07/03 1123) Resp:  [11-20] 20 (07/03 1123) BP: (108-141)/(60-78) 141/70 (07/03 1123) SpO2:  [93 %-100 %] 97 % (07/03 1123) Weight:  [81.6 kg] 81.6 kg (07/03 0416)  Intake/Output from previous day: 07/02 0701 - 07/03 0700 In: 250 [P.O.:250] Out: 1750 [Urine:1750]  Physical Exam Vitals and nursing note reviewed.  Constitutional:      General: He is not in acute distress. Neck:     Vascular: JVD present.  Cardiovascular:     Rate and Rhythm: Normal rate. Rhythm irregular.     Heart sounds: Normal heart sounds. No murmur heard. Pulmonary:     Effort: Pulmonary effort is normal.     Breath sounds: Normal breath sounds. No wheezing or rales.  Musculoskeletal:     Right lower leg: Edema (Trace) present.     Left lower leg: Edema (Trace) present.      Imaging/tests reviewed and independently interpreted: Telemetry: Afib   Cardiac Studies:  EKG 09/18/2021: Afib 67 bpm   VQ scan 09/19/2021: Imaging findings suggest low probability for pulmonary embolism.     Echocardiogram 08/31/2021:  1. Left ventricular ejection fraction, by estimation, is 45 to 50%. The  left ventricle has mildly decreased function. The left ventricle  demonstrates global hypokinesis. There is mild left ventricular  hypertrophy. Left ventricular diastolic parameters are indeterminate.   2. Right ventricular systolic function is mildly reduced. The right  ventricular size is normal. There is severely elevated pulmonary artery  systolic pressure. The estimated right ventricular systolic pressure is  54.6 mmHg.   3. Left atrial size was severely dilated.   4. Right atrial size was moderately dilated.   5. The mitral valve is normal in structure. Mild to moderate mitral valve   regurgitation. No evidence of mitral stenosis.   6. The aortic valve is tricuspid. There is mild calcification of the  aortic valve. Aortic valve regurgitation is not visualized. No aortic  stenosis is present.   7. The inferior vena cava is dilated in size with <50% respiratory  variability, suggesting right atrial pressure of 15 mmHg.   8. The patient is in atrial fibrillation.    Assessment & Recommendations:  86 y.o. 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 Cr marginally better today at 3.54 but probably close to his new baseline. Resume PO lasix 40 mg bid on discharge. Unable to use MRA/ARNI due to renal dysfunction Continue Bidil.   Persistent A-fib: Rate controlled.  High CHA2DS2-VASc 4.  Continue Eliquis 2.5 mg twice daily.   Trop elevation: Secondary to above   Long term prognosis is guarded. Agree with ongoing palliative care discussion.   Likely to SNF today. Will arrange outpatient follow up.     [name], MD Pager: [phone] Office: [phone]

## 2021-09-24 NOTE — Progress Notes (Signed)
RT NOTE:  Pt refuses CPAP for tonight.

## 2021-09-27 ENCOUNTER — Ambulatory Visit: Payer: Medicare Other | Admitting: Internal Medicine

## 2021-09-27 ENCOUNTER — Telehealth: Payer: Self-pay

## 2021-09-27 NOTE — Telephone Encounter (Signed)
TOC call:  Called patient regarding recent hospital visit. NA, LMAM.

## 2021-09-28 NOTE — Telephone Encounter (Signed)
Location of hospitalization: [facility] Reason for hospitalization: fever, low HR, low BP Date of discharge: 09/24/2021 Date of first communication with patient: today Person contacting patient: Me Current symptoms: unknown Do you understand why you were in the Hospital: Yes Questions regarding discharge instructions: None Where were you discharged to: Home Medications reviewed: Yes Allergies reviewed: Yes Dietary changes reviewed: Yes. Discussed low fat and low salt diet.  Referals reviewed: NA Activities of Daily Living: Able to with mild limitations Any transportation issues/concerns: None Any patient concerns: None Confirmed importance & date/time of Follow up appt: Yes Confirmed with patient if condition begins to worsen call. Pt was given the office number and encouraged to call back with questions or concerns: Yes

## 2021-10-01 ENCOUNTER — Encounter (HOSPITAL_COMMUNITY): Payer: Self-pay

## 2021-10-08 ENCOUNTER — Ambulatory Visit: Payer: BC Managed Care – PPO | Admitting: Cardiology

## 2021-10-09 ENCOUNTER — Non-Acute Institutional Stay: Payer: BC Managed Care – PPO | Admitting: Hospice

## 2021-10-09 DIAGNOSIS — I482 Chronic atrial fibrillation, unspecified: Secondary | ICD-10-CM

## 2021-10-09 DIAGNOSIS — K922 Gastrointestinal hemorrhage, unspecified: Secondary | ICD-10-CM

## 2021-10-09 DIAGNOSIS — Z515 Encounter for palliative care: Secondary | ICD-10-CM

## 2021-10-09 DIAGNOSIS — I5033 Acute on chronic diastolic (congestive) heart failure: Secondary | ICD-10-CM

## 2021-10-09 DIAGNOSIS — R531 Weakness: Secondary | ICD-10-CM

## 2021-10-09 NOTE — Progress Notes (Addendum)
[facility] Consult Note Telephone: [phone]  Fax: [phone]  PATIENT NAME: Roy [name] [address] [place] [address] [id number] (home)  DOB: [birth year] MRN: [id number]  PRIMARY CARE PROVIDER:    Rogers Blocker, MD,  [address] [place] [address] [phone]  REFERRING PROVIDER:   Rogers Blocker, [place],  [place] [address] [phone]  RESPONSIBLE PARTY:    Contact Information     Name Relation Home Work Dickinson Son   [phone]   Dwight, Adamczak   [phone]        I met face to face with patient and family at facility. Visit to build trust and highlight Palliative Medicine as specialized medical care for people living with serious illness, aimed at facilitating better quality of life through symptoms relief, assisting with advance care planning and complex medical decision making.  NP called Jeoff and left him a voicemail with callback number. Jeoff later called back and expressed appreciation for the update. He also confirmed that patient is a Do Not Resuscitate.  ASSESSMENT AND / RECOMMENDATIONS:   Advance Care Planning: Our advance care planning conversation included a discussion about:    The value and importance of advance care planning  Difference between Hospice and Palliative care Exploration of goals of care in the event of a sudden injury or illness  Identification and preparation of a healthcare agent  Review and updating or creation of an  advance directive document . Decision not to resuscitate or to de-escalate disease focused treatments due to poor prognosis.  CODE STATUS: Patient elects Do Not Resuscitate  Goals of Care: Goals include to maximize quality of life and symptom management  I spent  16 minutes providing this initial consultation. More than 50% of the time in this consultation was  spent on counseling patient and coordinating communication. --------------------------------------------------------------------------------------------------------------------------------------  Symptom Management/Plan: Acute on chronic diastolic CHF: Recent hospitalization for exacerbation.  Continue Lasix as ordered.  Adhere to fluid/salt limits.  Monitor weight closely and report weight gain of 2 pounds in a day or 5 pounds in a week.  Routine CBC BMP Weakness: PT OT is ongoing. Chronic Afib: Continue Cardizem. Hold Eliquis Gi Bleed: Acute. Stat CBC WITH DIFFERENTIAL, BMP. Hold Eliquis. Initiate Carafate. Collaborative discussion with facility PCP on patient; in agreement with plan of care, and to monitor closely.  Follow up: Palliative care will continue to follow for complex medical decision making, advance care planning, and clarification of goals. Return 6 weeks or prn. Encouraged to call provider sooner with any concerns.   Family /Caregiver/Community Supports: Patient in SNF for acute rehab  HOSPICE ELIGIBILITY/DIAGNOSIS: TBD  Chief Complaint: Palliative care f/u visit  HISTORY OF PRESENT ILLNESS:  Roy [name] is a 86 y.o. year old male  with multiple morbidities requiring close monitoring and with high risk of complications and  mortality: Acute on chronic diastolic congestive heart failure.GI bleed, chronic atrial fibrillation, weakness. Hx of COPD, OSA on CPAP, GERD, diet controlled DM, arthritis, BPH with hx of LUTS/nephrolithiasis. History obtained from review of EMR, discussion with primary team, caregiver, family and/or [name].  Review and summarization of Epic records shows history from other than patient. Rest of 10 point ROS asked and negative. Independent interpretation of tests and reviewed as needed, available labs, patient records, imaging, studies and related documents from the EMR.  PAST MEDICAL HISTORY:  Active Ambulatory Problems  Diagnosis Date Noted    DM2 (diabetes mellitus, type 2) ([place]) 01/06/2007   EXOGENOUS OBESITY 01/06/2007   OSA on CPAP 01/06/2007   Uncontrolled hypertension 01/06/2007   Chronic obstructive airway disease with asthma ([place]) 01/06/2007   CKD (chronic kidney disease), stage IV ([place]) 10/06/2015   Leg edema 11/18/2019   Exertional dyspnea 11/18/2019   Other secondary pulmonary hypertension ([place]) 12/17/2019   Persistent atrial fibrillation ([place]) 12/17/2019   Benign prostatic hyperplasia with urinary obstruction 07/24/2015   Syncope and collapse 02/20/2021   Elevated LFTs 02/20/2021   Leukocytosis 02/20/2021   Chronic diastolic CHF (congestive heart failure) ([place]) 02/20/2021   Needs assistance with community resources 07/10/2021   Palliative care encounter 07/28/2021   Acute on chronic diastolic heart failure ([place]) 08/30/2021   Weakness 08/30/2021   CHF exacerbation ([place]) 08/30/2021   Elevated troponin 08/30/2021   Acute on chronic diastolic CHF (congestive heart failure) ([place]) 09/18/2021   Positive D dimer 09/19/2021   Thrombocytopenia ([place]) 09/19/2021   Resolved Ambulatory Problems    Diagnosis Date Noted   AKI (acute kidney injury) ([place]) 10/06/2015   Sepsis secondary to UTI ([place]) 10/06/2015   UTI (lower urinary tract infection) 10/06/2015   Bacteremia due to Enterococcus 10/09/2015   Acute deep vein thrombosis (DVT) of femoral vein of left lower extremity (H[facility])    Abscess of external ear, left 01/11/2016   Infected cyst of skin 01/25/2016   Hypokalemia 02/20/2021   Acute renal failure superimposed on stage 3 chronic kidney disease ([place]) 02/20/2021   Past Medical History:  Diagnosis Date   A-fib ([place])    Arthritis HANDS/ THUMBS/ SHOULDERS   Bladder calculi    Chronic obstructive asthma, unspecified FOLLOWED BY DR Tarri Fuller YOUNG  VISIT 10-14-2011 IN EPIC   COPD (chronic obstructive pulmonary disease) ([place])    Cyst on ear    Diabetes mellitus    DVT (deep venous thrombosis) (H[facility])    Dyspnea on  exertion    GERD (gastroesophageal reflux disease)    Hematuria    History of prostate cancer 2010-- S/P  EXTERNAL RADIATION   Hydronephrosis, left    Hypertension    Sepsis ([place])    Wears glasses     SOCIAL HX:  Social History   Tobacco Use   Smoking status: Former    Packs/day: 0.50    Years: 25.00    Total pack years: 12.50    Types: Cigarettes    Quit date: 03/25/1998    Years since quitting: 23.5   Smokeless tobacco: Never  Substance Use Topics   Alcohol use: Not Currently     FAMILY HX:  Family History  Problem Relation Age of Onset   Diabetes Father       ALLERGIES: No Known Allergies    PERTINENT MEDICATIONS:  Outpatient Encounter Medications as of 10/09/2021  Medication Sig   cinacalcet (SENSIPAR) 30 MG tablet Take 30 mg by mouth daily with supper.   CONTOUR NEXT TEST test strip daily.   diltiazem (CARDIZEM CD) 240 MG 24 hr capsule Take 240 mg by mouth daily.   ELIQUIS 2.5 MG TABS tablet Take 2.5 mg by mouth 2 (two) times daily.   finasteride (PROSCAR) 5 MG tablet Take 5 mg by mouth daily.   Fluticasone-Umeclidin-Vilant (TRELEGY ELLIPTA) 100-62.5-25 MCG/INH AEPB Inhale 1 puff into the lungs daily. (Patient not taking: Reported on 09/19/2021)   furosemide (LASIX) 40 MG tablet Take 1 tablet (40 mg total) by mouth 2 (two) times daily.   gabapentin (NEURONTIN)  100 MG capsule Take 100 mg by mouth at bedtime.   isosorbide-hydrALAZINE (BIDIL) 20-37.5 MG tablet Take 1 tablet by mouth 3 (three) times daily.   Microlet Lancets MISC    pantoprazole (PROTONIX) 40 MG tablet Take 40 mg by mouth daily.   potassium chloride SA (KLOR-CON M) 20 MEQ tablet Take 20 mEq by mouth daily.   tamsulosin (FLOMAX) 0.4 MG CAPS capsule Take 1 capsule (0.4 mg total) by mouth daily. (Patient taking differently: Take 0.4 mg by mouth in the morning and at bedtime.)   No facility-administered encounter medications on file as of 10/09/2021.     Thank you for the opportunity to participate in  the care of Mr. Asfaw.  The palliative care team will continue to follow. Please call our office at [phone] if we can be of additional assistance.   Note: Portions of this note were generated with Lobbyist. Dictation errors may occur despite best attempts at proofreading.  Teodoro Spray, NP

## 2021-10-10 ENCOUNTER — Non-Acute Institutional Stay: Payer: BC Managed Care – PPO | Admitting: Hospice

## 2021-10-17 ENCOUNTER — Telehealth: Payer: Self-pay | Admitting: Podiatry

## 2021-10-17 ENCOUNTER — Ambulatory Visit: Payer: BC Managed Care – PPO | Admitting: Cardiology

## 2021-10-17 NOTE — Telephone Encounter (Signed)
Left message on machine to call back to schedule picking up diabetic shoes .   

## 2021-11-11 ENCOUNTER — Emergency Department (HOSPITAL_COMMUNITY): Payer: Medicare Other

## 2021-11-11 ENCOUNTER — Observation Stay (HOSPITAL_COMMUNITY)
Admission: EM | Admit: 2021-11-11 | Discharge: 2021-11-13 | Disposition: A | Discharge status: Home Health | Payer: Medicare Other | Attending: Family Medicine | Admitting: Family Medicine

## 2021-11-11 ENCOUNTER — Encounter (HOSPITAL_COMMUNITY): Payer: Self-pay

## 2021-11-11 ENCOUNTER — Other Ambulatory Visit: Payer: Self-pay

## 2021-11-11 DIAGNOSIS — D649 Anemia, unspecified: Secondary | ICD-10-CM

## 2021-11-11 DIAGNOSIS — Z86718 Personal history of other venous thrombosis and embolism: Secondary | ICD-10-CM | POA: Diagnosis not present

## 2021-11-11 DIAGNOSIS — J449 Chronic obstructive pulmonary disease, unspecified: Secondary | ICD-10-CM | POA: Insufficient documentation

## 2021-11-11 DIAGNOSIS — I5042 Chronic combined systolic (congestive) and diastolic (congestive) heart failure: Secondary | ICD-10-CM | POA: Diagnosis not present

## 2021-11-11 DIAGNOSIS — N138 Other obstructive and reflux uropathy: Secondary | ICD-10-CM | POA: Diagnosis present

## 2021-11-11 DIAGNOSIS — Z79899 Other long term (current) drug therapy: Secondary | ICD-10-CM | POA: Diagnosis not present

## 2021-11-11 DIAGNOSIS — I5032 Chronic diastolic (congestive) heart failure: Secondary | ICD-10-CM | POA: Diagnosis present

## 2021-11-11 DIAGNOSIS — R7989 Other specified abnormal findings of blood chemistry: Secondary | ICD-10-CM | POA: Diagnosis not present

## 2021-11-11 DIAGNOSIS — Z87891 Personal history of nicotine dependence: Secondary | ICD-10-CM | POA: Insufficient documentation

## 2021-11-11 DIAGNOSIS — R531 Weakness: Secondary | ICD-10-CM

## 2021-11-11 DIAGNOSIS — N401 Enlarged prostate with lower urinary tract symptoms: Secondary | ICD-10-CM | POA: Diagnosis present

## 2021-11-11 DIAGNOSIS — I4819 Other persistent atrial fibrillation: Secondary | ICD-10-CM | POA: Diagnosis not present

## 2021-11-11 DIAGNOSIS — Z8546 Personal history of malignant neoplasm of prostate: Secondary | ICD-10-CM | POA: Insufficient documentation

## 2021-11-11 DIAGNOSIS — N3000 Acute cystitis without hematuria: Secondary | ICD-10-CM | POA: Diagnosis not present

## 2021-11-11 DIAGNOSIS — N179 Acute kidney failure, unspecified: Secondary | ICD-10-CM | POA: Insufficient documentation

## 2021-11-11 DIAGNOSIS — N189 Chronic kidney disease, unspecified: Secondary | ICD-10-CM

## 2021-11-11 DIAGNOSIS — D638 Anemia in other chronic diseases classified elsewhere: Secondary | ICD-10-CM

## 2021-11-11 DIAGNOSIS — E1122 Type 2 diabetes mellitus with diabetic chronic kidney disease: Secondary | ICD-10-CM | POA: Diagnosis not present

## 2021-11-11 DIAGNOSIS — D631 Anemia in chronic kidney disease: Secondary | ICD-10-CM | POA: Diagnosis not present

## 2021-11-11 DIAGNOSIS — G4733 Obstructive sleep apnea (adult) (pediatric): Secondary | ICD-10-CM

## 2021-11-11 DIAGNOSIS — R778 Other specified abnormalities of plasma proteins: Secondary | ICD-10-CM | POA: Diagnosis present

## 2021-11-11 DIAGNOSIS — Z7901 Long term (current) use of anticoagulants: Secondary | ICD-10-CM | POA: Insufficient documentation

## 2021-11-11 DIAGNOSIS — I130 Hypertensive heart and chronic kidney disease with heart failure and stage 1 through stage 4 chronic kidney disease, or unspecified chronic kidney disease: Secondary | ICD-10-CM | POA: Diagnosis not present

## 2021-11-11 DIAGNOSIS — I10 Essential (primary) hypertension: Secondary | ICD-10-CM | POA: Diagnosis present

## 2021-11-11 DIAGNOSIS — N390 Urinary tract infection, site not specified: Secondary | ICD-10-CM | POA: Diagnosis not present

## 2021-11-11 DIAGNOSIS — N184 Chronic kidney disease, stage 4 (severe): Secondary | ICD-10-CM | POA: Insufficient documentation

## 2021-11-11 DIAGNOSIS — E119 Type 2 diabetes mellitus without complications: Secondary | ICD-10-CM

## 2021-11-11 LAB — CBC WITH DIFFERENTIAL/PLATELET
Abs Immature Granulocytes: 0.04 10*3/uL (ref 0.00–0.07)
Basophils Absolute: 0 10*3/uL (ref 0.0–0.1)
Basophils Relative: 1 %
Eosinophils Absolute: 0.2 10*3/uL (ref 0.0–0.5)
Eosinophils Relative: 3 %
HCT: 26.4 % — ABNORMAL LOW (ref 39.0–52.0)
Hemoglobin: 8.4 g/dL — ABNORMAL LOW (ref 13.0–17.0)
Immature Granulocytes: 1 %
Lymphocytes Relative: 9 %
Lymphs Abs: 0.6 10*3/uL — ABNORMAL LOW (ref 0.7–4.0)
MCH: 31.3 pg (ref 26.0–34.0)
MCHC: 31.8 g/dL (ref 30.0–36.0)
MCV: 98.5 fL (ref 80.0–100.0)
Monocytes Absolute: 0.4 10*3/uL (ref 0.1–1.0)
Monocytes Relative: 7 %
Neutro Abs: 5.1 10*3/uL (ref 1.7–7.7)
Neutrophils Relative %: 79 %
Platelets: 218 10*3/uL (ref 150–400)
RBC: 2.68 MIL/uL — ABNORMAL LOW (ref 4.22–5.81)
RDW: 17.1 % — ABNORMAL HIGH (ref 11.5–15.5)
WBC: 6.4 10*3/uL (ref 4.0–10.5)
nRBC: 0 % (ref 0.0–0.2)

## 2021-11-11 LAB — COMPREHENSIVE METABOLIC PANEL
ALT: 15 U/L (ref 0–44)
AST: 13 U/L — ABNORMAL LOW (ref 15–41)
Albumin: 2.8 g/dL — ABNORMAL LOW (ref 3.5–5.0)
Alkaline Phosphatase: 100 U/L (ref 38–126)
Anion gap: 10 (ref 5–15)
BUN: 77 mg/dL — ABNORMAL HIGH (ref 8–23)
CO2: 25 mmol/L (ref 22–32)
Calcium: 9.7 mg/dL (ref 8.9–10.3)
Chloride: 105 mmol/L (ref 98–111)
Creatinine, Ser: 3.69 mg/dL — ABNORMAL HIGH (ref 0.61–1.24)
GFR, Estimated: 15 mL/min — ABNORMAL LOW (ref >60)
Glucose, Bld: 127 mg/dL — ABNORMAL HIGH (ref 70–99)
Potassium: 4.6 mmol/L (ref 3.5–5.1)
Sodium: 140 mmol/L (ref 135–145)
Total Bilirubin: 1 mg/dL (ref 0.3–1.2)
Total Protein: 6.2 g/dL — ABNORMAL LOW (ref 6.5–8.1)

## 2021-11-11 LAB — URINALYSIS, ROUTINE W REFLEX MICROSCOPIC
Bilirubin Urine: NEGATIVE
Glucose, UA: NEGATIVE mg/dL
Ketones, ur: NEGATIVE mg/dL
Nitrite: NEGATIVE
Protein, ur: 100 mg/dL — AB
Specific Gravity, Urine: 1.01 (ref 1.005–1.030)
pH: 6.5 (ref 5.0–8.0)

## 2021-11-11 LAB — TROPONIN I (HIGH SENSITIVITY)
Troponin I (High Sensitivity): 35 ng/L — ABNORMAL HIGH (ref <18)
Troponin I (High Sensitivity): 35 ng/L — ABNORMAL HIGH (ref <18)

## 2021-11-11 LAB — BRAIN NATRIURETIC PEPTIDE: B Natriuretic Peptide: 554.5 pg/mL — ABNORMAL HIGH (ref 0.0–100.0)

## 2021-11-11 LAB — URINALYSIS, MICROSCOPIC (REFLEX)
Bacteria, UA: NONE SEEN
Squamous Epithelial / HPF: NONE SEEN (ref 0–5)
WBC, UA: >50 WBC/hpf (ref 0–5)

## 2021-11-11 LAB — MAGNESIUM: Magnesium: 1.7 mg/dL (ref 1.7–2.4)

## 2021-11-11 LAB — POC OCCULT BLOOD, ED: Fecal Occult Bld: NEGATIVE

## 2021-11-11 MED ORDER — CINACALCET HCL 30 MG PO TABS
30.0000 mg | ORAL_TABLET | Freq: Every day | ORAL | Status: DC
  Administered 2021-11-12: 30 mg via ORAL
  Filled 2021-11-11 (×2): qty 1

## 2021-11-11 MED ORDER — ACETAMINOPHEN 325 MG PO TABS: 650.0000 mg | ORAL_TABLET | Freq: Four times a day (QID) | ORAL | Status: DC | PRN

## 2021-11-11 MED ORDER — SODIUM CHLORIDE 0.9% FLUSH: 3.0000 mL | INTRAVENOUS | Status: DC | PRN

## 2021-11-11 MED ORDER — TAMSULOSIN HCL 0.4 MG PO CAPS
0.4000 mg | ORAL_CAPSULE | Freq: Two times a day (BID) | ORAL | Status: DC
  Administered 2021-11-11 – 2021-11-13 (×4): 0.4 mg via ORAL
  Filled 2021-11-11 (×4): qty 1

## 2021-11-11 MED ORDER — GABAPENTIN 100 MG PO CAPS
100.0000 mg | ORAL_CAPSULE | Freq: Every day | ORAL | Status: DC
  Administered 2021-11-11 – 2021-11-12 (×2): 100 mg via ORAL
  Filled 2021-11-11 (×2): qty 1

## 2021-11-11 MED ORDER — SODIUM CHLORIDE 0.9% FLUSH
3.0000 mL | Freq: Two times a day (BID) | INTRAVENOUS | Status: DC
Start: 2021-11-11 — End: 2021-11-13
  Administered 2021-11-12: 3 mL via INTRAVENOUS

## 2021-11-11 MED ORDER — TRAZODONE HCL 50 MG PO TABS
50.0000 mg | ORAL_TABLET | Freq: Every day | ORAL | Status: DC
  Administered 2021-11-11 – 2021-11-12 (×2): 50 mg via ORAL
  Filled 2021-11-11 (×2): qty 1

## 2021-11-11 MED ORDER — SODIUM CHLORIDE 0.9 % IV SOLN
2.0000 g | Freq: Once | INTRAVENOUS | Status: DC
  Filled 2021-11-11: qty 20

## 2021-11-11 MED ORDER — APIXABAN 2.5 MG PO TABS
2.5000 mg | ORAL_TABLET | Freq: Two times a day (BID) | ORAL | Status: DC
  Administered 2021-11-11 – 2021-11-13 (×4): 2.5 mg via ORAL
  Filled 2021-11-11 (×5): qty 1

## 2021-11-11 MED ORDER — SODIUM CHLORIDE 0.9 % IV SOLN
1.0000 g | INTRAVENOUS | Status: DC
  Administered 2021-11-11 – 2021-11-12 (×2): 1 g via INTRAVENOUS
  Filled 2021-11-11: qty 10

## 2021-11-11 MED ORDER — ACETAMINOPHEN 650 MG RE SUPP: 650.0000 mg | Freq: Four times a day (QID) | RECTAL | Status: DC | PRN

## 2021-11-11 MED ORDER — PANTOPRAZOLE SODIUM 40 MG PO TBEC
40.0000 mg | DELAYED_RELEASE_TABLET | Freq: Every day | ORAL | Status: DC
  Administered 2021-11-11 – 2021-11-13 (×3): 40 mg via ORAL
  Filled 2021-11-11 (×3): qty 1

## 2021-11-11 MED ORDER — DILTIAZEM HCL ER COATED BEADS 120 MG PO CP24
240.0000 mg | ORAL_CAPSULE | Freq: Every day | ORAL | Status: DC
  Administered 2021-11-12 – 2021-11-13 (×2): 240 mg via ORAL
  Filled 2021-11-11: qty 1
  Filled 2021-11-11: qty 2

## 2021-11-11 MED ORDER — SODIUM CHLORIDE 0.9 % IV SOLN
250.0000 mL | INTRAVENOUS | Status: DC | PRN
  Administered 2021-11-12: 250 mL via INTRAVENOUS

## 2021-11-11 MED ORDER — FINASTERIDE 5 MG PO TABS
5.0000 mg | ORAL_TABLET | Freq: Every day | ORAL | Status: DC
  Administered 2021-11-11 – 2021-11-13 (×3): 5 mg via ORAL
  Filled 2021-11-11 (×3): qty 1

## 2021-11-11 NOTE — ED Notes (Signed)
Pt is a hard stick. RN attempted 2X unsuccessful

## 2021-11-11 NOTE — Assessment & Plan Note (Addendum)
Continue cpap at night  

## 2021-11-11 NOTE — ED Provider Triage Note (Signed)
Emergency Medicine Provider Triage Evaluation Note  Roy [name] , a 86 y.o. male  was evaluated in triage.  Pt complains of not feeling well since yesterday.  Denies chest pain, shortness of breath.  Does have history of CHF.  Bradycardic in triage.  Per most recent admission patient had normal heart rate.  Will obtain EKG, cardiac labs including BNP, magnesium.   Review of Systems  Positive: As above Negative: As above  Physical Exam  BP 121/60   Pulse (!) 42   Temp 98.1 F (36.7 C) (Oral)   Resp 16   SpO2 95%  Gen:   Awake, no distress   Resp:  Normal effort  MSK:   Moves extremities without difficulty  Other:  For range of motion bilateral upper and lower extremities.  Symmetrical strength.  Without facial droop.  Cranial nerves III through XII intact.  Without peripheral edema.  Medical Decision Making  Medically screening exam initiated at 11:25 AM.  Appropriate orders placed.  BOYKIN BAETZ was informed that the remainder of the evaluation will be completed by another provider, this initial triage assessment does not replace that evaluation, and the importance of remaining in the ED until their evaluation is complete.     Evlyn Courier, PA-C 11/11/21 1127

## 2021-11-11 NOTE — Assessment & Plan Note (Addendum)
Per hospital d/c in June 2023: Baseline creatinine has been between 2.8-3, most recently in 3.3 range, likely his new baseline His creatinine is up to 3.69 and BUN up to 77. GFR now 15 Likely contributing to his weakness Family states he does not want dialysis, has been seen by palliative care. If renal function worsens to the point of needing dialysis they would want hospice.  Hold lasix (per family on torsemide, not lasix)  Strict I/O UA Trend

## 2021-11-11 NOTE — ED Notes (Signed)
Patient transported to CT 

## 2021-11-11 NOTE — Assessment & Plan Note (Addendum)
Appears euvolemic CXR with on edema and no symptoms of exacerbation  BNP elevated in setting of worsening renal disease, OSA Strict I/O Holding lasix today

## 2021-11-11 NOTE — H&P (Signed)
History and Physical    Patient: Roy [name] EZM:[id number] DOB: 1935/05/11 DOA: 11/11/2021 DOS: the patient was seen and examined on 11/11/2021 PCP: Rogers Blocker, MD  Patient coming from: Home - lives alone. Uses a walker.    Chief Complaint: weakness   HPI: Roy [name] is a 86 y.o. male with medical history significant of chronic atrial fibrillation on eliquis, HTN, T2DM, OSA on CPAP, chronic obstructive airway disease with asthma, BPH, anemia of chronic kidney disease with weekly injections of retacrit who presented to ED with complaints of weakness and not feeling well. Poor historian. Very unpleasant and not happy to answer any of my questions. History from his son. He slid off the bed today.  They were unable to get him off the floor due to weakness. EMS was called and left as he had no focal deficits or unilateral weakness. Home health nurse came at 9:30 and thought he was much weaker than normal over this past week.she also thought he may be more confused, but family states he is at baseline cognitively. Home health took his vitals and his heart rate was in the lower 40s. They were concerned due to increased weakness so they brought him to ED.    He has been feeling good. Denies any fever/chills, vision changes/headaches, chest pain or palpitations, shortness of breath or cough, abdominal pain, N/V/D, dysuria or leg swelling.     He does not smoke or drink.   ER Course:  vitals: afebrile, bp: 121/60, HR: 80, RR: 16, oxygen: 95%RA Pertinent labs: hgb: 8.4, BUN: 77, creatinine: 3.69, GFR 15, troponin 35>35, BNP: 554, UA: large LE, >50wbc.  CXR: no acute finding CT head: no acute findings.  In ED: given rocephin, TRH asked to admit.    Review of Systems: As mentioned in the history of present illness. All other systems reviewed and are negative. Past Medical History:  Diagnosis Date   A-fib [facility])    Abscess of external ear, left 01/11/2016   Acute deep vein thrombosis  (DVT) of femoral vein of left lower extremity (HCC)    VQ scan negative/low probability   Arthritis HANDS/ THUMBS/ SHOULDERS   Bacteremia due to Enterococcus 10/09/2015   Bladder calculi    Chronic obstructive asthma, unspecified FOLLOWED BY DR Tarri Fuller YOUNG  VISIT 10-14-2011 IN [facility]   COPD (chronic obstructive pulmonary disease) (HCC)    Cyst on ear    left   Diabetes mellitus    DVT (deep venous thrombosis) (HCC)    Dyspnea on exertion    GERD (gastroesophageal reflux disease)    Hematuria    History of prostate cancer 2010-- S/P  EXTERNAL RADIATION   Hydronephrosis, left    Hypertension    Infected cyst of skin 01/25/2016   OSA on CPAP    wears CPAP   Sepsis ([place])    Sepsis secondary to UTI ([place]) 10/06/2015   UTI (lower urinary tract infection) 10/06/2015   Wears glasses    Past Surgical History:  Procedure Laterality Date   CARPAL TUNNEL RELEASE  05-28-2005   LEFT   CYSTOSCOPY N/A 09/26/2012   Procedure: CYSTOSCOPY, clot evacuation,fulgeration of bladder, bladder biopsy, transurethreal vaporation of prostat;  Surgeon: Alexis Frock, MD;  Location: [facility];  Service: Urology;  Laterality: N/A;   CYSTOSCOPY W/ RETROGRADES Bilateral 10/14/2015   Procedure: CYSTOSCOPY WITH BILATERAL RETROGRADE PYELOGRAM/ FULGERATION WITH LITHOLAPAXY;  Surgeon: Festus Aloe, MD;  Location: [facility];  Service: Urology;  Laterality: Bilateral;   CYSTOSCOPY WITH  LITHOLAPAXY  10/18/2011   Procedure: CYSTOSCOPY WITH LITHOLAPAXY;  Surgeon: Hanley Ben, MD;  Location: [facility];  Service: Urology;  Laterality: N/A;  1 HOUR NO STENT H# [id number] CELL# [phone]  MEDICARE BCBS   EAR CYST EXCISION Left 02/23/2016   Procedure: EXCISION LEFT EAR CYST;  Surgeon: Izora Gala, MD;  Location: [place];  Service: ENT;  Laterality: Left;   GANGLION CYST EXCISION  1988  (APPROX)   [place]   TEE WITHOUT CARDIOVERSION N/A 10/11/2015   Procedure: TRANSESOPHAGEAL  ECHOCARDIOGRAM (TEE);  Surgeon: Sanda Klein, MD;  Location: [facility] ENDOSCOPY;  Service: Cardiovascular;  Laterality: N/A;   Social History:  reports that he quit smoking about 23 years ago. His smoking use included cigarettes. He has a 12.50 pack-year smoking history. He has never used smokeless tobacco. He reports that he does not currently use alcohol. He reports that he does not use drugs.  No Known Allergies  Family History  Problem Relation Age of Onset   Diabetes Father     Prior to Admission medications   Medication Sig Start Date End Date Taking? Authorizing Provider  cinacalcet (SENSIPAR) 30 MG tablet Take 30 mg by mouth daily with supper. 06/06/19   [provider]  CONTOUR NEXT TEST test strip daily. 12/10/19   [provider]  diltiazem (CARDIZEM CD) 240 MG 24 hr capsule Take 240 mg by mouth daily.    [provider]  ELIQUIS 2.5 MG TABS tablet Take 2.5 mg by mouth 2 (two) times daily. 04/14/20   [provider]  finasteride (PROSCAR) 5 MG tablet Take 5 mg by mouth daily.    [provider]  Fluticasone-Umeclidin-Vilant (TRELEGY ELLIPTA) 100-62.5-25 MCG/INH AEPB Inhale 1 puff into the lungs daily. Patient not taking: Reported on 09/19/2021 02/29/20   Deneise Lever, MD  furosemide (LASIX) 40 MG tablet Take 1 tablet (40 mg total) by mouth 2 (two) times daily. 09/25/21   Rai, Vernelle Emerald, MD  gabapentin (NEURONTIN) 100 MG capsule Take 100 mg by mouth at bedtime.    [provider]  isosorbide-hydrALAZINE (BIDIL) 20-37.5 MG tablet Take 1 tablet by mouth 3 (three) times daily. 09/24/21   Mendel Corning, MD  Microlet Lancets MISC  12/10/19   [provider]  pantoprazole (PROTONIX) 40 MG tablet Take 40 mg by mouth daily.    [provider]  potassium chloride SA (KLOR-CON M) 20 MEQ tablet Take 20 mEq by mouth daily.    [provider]  tamsulosin (FLOMAX) 0.4 MG CAPS capsule Take 1 capsule (0.4 mg total) by mouth  daily. Patient taking differently: Take 0.4 mg by mouth in the morning and at bedtime. 10/18/15   Bonnell Public, MD    Physical Exam: Vitals:   11/11/21 1730 11/11/21 1800 11/11/21 1815 11/11/21 1830  BP: 137/65 135/62 137/68 129/74  Pulse: (!) 59 60 61 62  Resp: '14 14 16 14  '$ Temp:      TempSrc:      SpO2: 98% 97% 96% 98%   General:  Appears calm and comfortable and is in NAD. Does not want to talk to me or answer any questions. Irritated I'm in room.  Eyes:  PERRL, EOMI, normal lids, iris ENT:  grossly normal hearing, lips & tongue, mmm; appropriate dentition Neck:  no LAD, masses or thyromegaly; no carotid bruits Cardiovascular:  regular, irregular, no m/r/g. +LE edema   Respiratory:   CTA bilaterally with  no wheezes/rales/rhonchi.  Normal respiratory effort. Abdomen:  soft, NT, ND, NABS Back:   normal alignment, no CVAT Skin:  no rash or induration seen on limited exam Musculoskeletal:  grossly normal tone BUE/BLE, good ROM, no bony abnormality-limited exam, would not participate  Lower extremity:  + LE edema.  Limited foot exam with no ulcerations.  2+ distal pulses. Psychiatric:  grossly normal mood and affect, speech fluent and appropriate, would not answer orientation questions.  Neurologic:  moves all 4 extremities. No focal deficits. Would not participate in neuro exam.    Radiological Exams on Admission: Independently reviewed - see discussion in A/P where applicable  CT Head Wo Contrast  Result Date: 11/11/2021 CLINICAL DATA:  Neuro deficit.  Evaluate for stroke. EXAM: CT HEAD WITHOUT CONTRAST TECHNIQUE: Contiguous axial images were obtained from the base of the skull through the vertex without intravenous contrast. RADIATION DOSE REDUCTION: This exam was performed according to the departmental dose-optimization program which includes automated exposure control, adjustment of the mA and/or kV according to patient size and/or use of iterative reconstruction technique.  COMPARISON:  09/18/2021 FINDINGS: Brain: No evidence of acute infarction, hemorrhage, hydrocephalus, extra-axial collection or mass lesion/mass effect. There is mild diffuse low-attenuation within the subcortical and periventricular white matter compatible with chronic microvascular disease. Prominence of the sulci and ventricles compatible with brain atrophy. Vascular: No hyperdense vessel or unexpected calcification. Skull: Normal. Negative for fracture or focal lesion. Sinuses/Orbits: Paranasal sinuses and mastoid air cells are clear. Other: None IMPRESSION: 1. No acute intracranial abnormalities. 2. Chronic small vessel ischemic disease and brain atrophy. Electronically Signed   By: Kerby Moors M.D.   On: 11/11/2021 13:45   DG Chest Portable 1 View  Result Date: 11/11/2021 CLINICAL DATA:  Possible pneumonia or pulmonary edema. EXAM: PORTABLE CHEST 1 VIEW COMPARISON:  09/18/2021 FINDINGS: Cardiac silhouette is normal in size. No mediastinal or hilar masses. Mild interstitial prominence, most evident at the left lung base, stable. Lungs otherwise clear. Elevated left hemidiaphragm accentuated by patient tilting to the left. No convincing pleural effusion and no pneumothorax. Skeletal structures are grossly intact. IMPRESSION: No acute cardiopulmonary disease. Electronically Signed   By: Lajean Manes M.D.   On: 11/11/2021 11:44    EKG: Independently reviewed.  Atrial fibrillation with rate 81; nonspecific ST changes with no evidence of acute ischemia   Labs on Admission: I have personally reviewed the available labs and imaging studies at the time of the admission.  Pertinent labs:   hgb: 8.4,  BUN: 77, creatinine: 3.69,  GFR 15,  troponin 35>35,  BNP: 554,  UA: large LE, >50wbc.   Assessment and Plan: Principal Problem:   UTI (urinary tract infection) Active Problems:   Acute renal failure superimposed on stage 4 chronic kidney disease (HCC)   Weakness   Persistent atrial fibrillation  (HCC)   Chronic diastolic CHF (congestive heart failure) (HCC)   Elevated troponin   Uncontrolled hypertension   DM2 (diabetes mellitus, type 2) (HCC)   Chronic obstructive airway disease with asthma (HCC)   OSA on CPAP   Anemia of chronic disease   Benign prostatic hyperplasia with urinary obstruction    Assessment and Plan: * UTI (urinary tract infection) 86 year old presenting with increasing weakness over the past week found to have UTI and worsening renal function  -obs to tele -continue rocephin -urine culture pending -concern multifactorial in setting of worsening renal disease and uremia  -family states he is on methenamine, but contraindicated in renal impairment,  not on med list and discussed with family  -GOC discussed with family. See #2.   Acute renal failure superimposed on stage 4 chronic kidney disease ([place]) Per hospital d/c in June 2023: Baseline creatinine has been between 2.8-3, most recently in 3.3 range, likely his new baseline His creatinine is up to 3.69 and BUN up to 77. GFR now 15 Likely contributing to his weakness Family states he does not want dialysis, has been seen by palliative care. If renal function worsens to the point of needing dialysis they would want hospice.  Hold lasix (per family on torsemide, not lasix)  Strict I/O UA Trend   Weakness Likely multifactorial in setting of possible UTI, worsening uremia. Had weakness on last hospitalization and dispo to SNF Treat UTI GOC for CKD PT/OT to eval   Persistent atrial fibrillation ([place]) Some history of possible bradycardia to low 40s by home  Health nurse. EDP discussed there was a recording of 42 in ER based off pulse ox read, but EKG was in the 70s.  Will continue telemetry to make sure no bradycardia contributing to weakness Continue eliquis for now, but concerned as renal function worsens he will be a high fall risk  Continue cardizem, clarify if on labetalol   Elevated  troponin Troponin flat with no chest pain or changes on ekg Not consistent with ACS Continue to monitor  Chronic diastolic CHF (congestive heart failure) ([place]) Appears euvolemic CXR with on edema and no symptoms of exacerbation  BNP elevated in setting of worsening renal disease, OSA Strict I/O Holding lasix today   Uncontrolled hypertension Well controlled  Continue home medication of cardizem '240mg'$  daily, Bidil (unsure if on this) family also states on labetalol,  But not in med list, will wait for updated med rec.  Hold lasix with acute on chronic renal failure stage IV   Chronic obstructive airway disease with asthma (HCC) No sign of exacerbation, stable.  Continue home inhaler-per family rarely if ever uses   DM2 (diabetes mellitus, type 2) ([place]) Diet controlled at home, well controlled, hemoglobin A1c 5.4 on 09/19/2021 Diet and SSI if needed   Anemia of chronic disease hgb 8.4, fecal occult negative Has missed his iron infusions since d/c from hospital as he did not get at SNF and they haven't made it to appointments Consider IV iron here depending on GOC   OSA on CPAP Continue cpap at night   Benign prostatic hyperplasia with urinary obstruction Continue proscar and flomax     Advance Care Planning:   Code Status: Full Code   Consults: PT/OT   DVT Prophylaxis: eliquis   Family Communication: updated his son and his daughter in law over the phone: Ruel Favors and Tori Milks   Severity of Illness: The appropriate patient status for this patient is OBSERVATION. Observation status is judged to be reasonable and necessary in order to provide the required intensity of service to ensure the patient's safety. The patient's presenting symptoms, physical exam findings, and initial radiographic and laboratory data in the context of their medical condition is felt to place them at decreased risk for further clinical deterioration. Furthermore, it is anticipated that  the patient will be medically stable for discharge from the hospital within 2 midnights of admission.   Author: Orma Flaming, MD 11/11/2021 7:12 PM  For on call review [email or web address].

## 2021-11-11 NOTE — Assessment & Plan Note (Addendum)
Well controlled  Continue home medication of cardizem '240mg'$  daily, Bidil (unsure if on this) family also states on labetalol,  But not in med list, will wait for updated med rec.  Hold lasix with acute on chronic renal failure stage IV

## 2021-11-11 NOTE — ED Provider Notes (Signed)
[facility] EMERGENCY DEPARTMENT Provider Note   CSN: [id number] Arrival date & time: 11/11/21  1100     History  Chief complaint: Weakness  Roy [name] is a 86 y.o. male.  HPI   Patient presented to the ER for evaluation of not feeling well since yesterday.  Patient denies having any trouble with chest pain or shortness of breath.  He is not having any vomiting or diarrhea.  No fevers or chills.  Patient's had increasing weakness over the last week or 2.  According to the EMS report they noted bradycardia with heart rate in the 40s  Home Medications Prior to Admission medications   Medication Sig Start Date End Date Taking? Authorizing Provider  cinacalcet (SENSIPAR) 30 MG tablet Take 30 mg by mouth daily with supper. 06/06/19   [provider]  CONTOUR NEXT TEST test strip daily. 12/10/19   [provider]  diltiazem (CARDIZEM CD) 240 MG 24 hr capsule Take 240 mg by mouth daily.    [provider]  ELIQUIS 2.5 MG TABS tablet Take 2.5 mg by mouth 2 (two) times daily. 04/14/20   [provider]  finasteride (PROSCAR) 5 MG tablet Take 5 mg by mouth daily.    [provider]  Fluticasone-Umeclidin-Vilant (TRELEGY ELLIPTA) 100-62.5-25 MCG/INH AEPB Inhale 1 puff into the lungs daily. Patient not taking: Reported on 09/19/2021 02/29/20   Deneise Lever, MD  furosemide (LASIX) 40 MG tablet Take 1 tablet (40 mg total) by mouth 2 (two) times daily. 09/25/21   Rai, Vernelle Emerald, MD  gabapentin (NEURONTIN) 100 MG capsule Take 100 mg by mouth at bedtime.    [provider]  isosorbide-hydrALAZINE (BIDIL) 20-37.5 MG tablet Take 1 tablet by mouth 3 (three) times daily. 09/24/21   Mendel Corning, MD  Microlet Lancets MISC  12/10/19   [provider]  pantoprazole (PROTONIX) 40 MG tablet Take 40 mg by mouth daily.    [provider]  potassium chloride SA (KLOR-CON M) 20 MEQ tablet Take 20 mEq by mouth daily.     [provider]  tamsulosin (FLOMAX) 0.4 MG CAPS capsule Take 1 capsule (0.4 mg total) by mouth daily. Patient taking differently: Take 0.4 mg by mouth in the morning and at bedtime. 10/18/15   Bonnell Public, MD      Allergies    Patient has no known allergies.    Review of Systems   Review of Systems  Physical Exam Updated Vital Signs BP 130/65   Pulse (!) 59   Temp 97.8 F (36.6 C)   Resp 12   SpO2 97%  Physical Exam Vitals and nursing note reviewed.  Constitutional:      Appearance: He is well-developed.  HENT:     Head: Normocephalic and atraumatic.     Right Ear: External ear normal.     Left Ear: External ear normal.  Eyes:     General: No scleral icterus.       Right eye: No discharge.        Left eye: No discharge.     Conjunctiva/sclera: Conjunctivae normal.  Neck:     Trachea: No tracheal deviation.  Cardiovascular:     Rate and Rhythm: Normal rate and regular rhythm.  Pulmonary:     Effort: Pulmonary effort is normal. No respiratory distress.     Breath sounds: Normal breath sounds. No stridor. No wheezing or rales.  Abdominal:     General: Bowel sounds are  normal. There is no distension.     Palpations: Abdomen is soft.     Tenderness: There is no abdominal tenderness. There is no guarding or rebound.  Musculoskeletal:        General: No tenderness or deformity.     Cervical back: Neck supple.  Skin:    General: Skin is warm and dry.     Findings: No rash.  Neurological:     General: No focal deficit present.     Mental Status: He is alert.     Cranial Nerves: No cranial nerve deficit (no facial droop, extraocular movements intact, no slurred speech).     Sensory: No sensory deficit.     Motor: No abnormal muscle tone or seizure activity.     Coordination: Coordination normal.  Psychiatric:        Mood and Affect: Mood normal.     ED Results / Procedures / Treatments   Labs (all labs ordered are listed, but only abnormal results  are displayed) Labs Reviewed  CBC WITH DIFFERENTIAL/PLATELET - Abnormal; Notable for the following components:      Result Value   RBC 2.68 (*)    Hemoglobin 8.4 (*)    HCT 26.4 (*)    RDW 17.1 (*)    Lymphs Abs 0.6 (*)    All other components within normal limits  COMPREHENSIVE METABOLIC PANEL - Abnormal; Notable for the following components:   Glucose, Bld 127 (*)    BUN 77 (*)    Creatinine, Ser 3.69 (*)    Total Protein 6.2 (*)    Albumin 2.8 (*)    AST 13 (*)    GFR, Estimated 15 (*)    All other components within normal limits  BRAIN NATRIURETIC PEPTIDE - Abnormal; Notable for the following components:   B Natriuretic Peptide 554.5 (*)    All other components within normal limits  URINALYSIS, ROUTINE W REFLEX MICROSCOPIC - Abnormal; Notable for the following components:   Hgb urine dipstick MODERATE (*)    Protein, ur 100 (*)    Leukocytes,Ua LARGE (*)    All other components within normal limits  TROPONIN I (HIGH SENSITIVITY) - Abnormal; Notable for the following components:   Troponin I (High Sensitivity) 35 (*)    All other components within normal limits  TROPONIN I (HIGH SENSITIVITY) - Abnormal; Notable for the following components:   Troponin I (High Sensitivity) 35 (*)    All other components within normal limits  URINE CULTURE  MAGNESIUM  URINALYSIS, MICROSCOPIC (REFLEX)  POC OCCULT BLOOD, ED    EKG EKG Interpretation  Date/Time:  Sunday November 11 2021 11:42:25 EDT Ventricular Rate:  81 PR Interval:  180 QRS Duration: 80 QT Interval:  380 QTC Calculation: 441 R Axis:   -36 Text Interpretation: poor data quality, ? a fib Left axis deviation T wave abnormality, consider lateral ischemia Abnormal ECG When compared with ECG of 18-Sep-2021 17:25, rate faster since last tracing Confirmed by Dorie Rank [phone]) on 11/11/2021 12:52:23 PM  Radiology CT Head Wo Contrast  Result Date: 11/11/2021 CLINICAL DATA:  Neuro deficit.  Evaluate for stroke. EXAM: CT HEAD  WITHOUT CONTRAST TECHNIQUE: Contiguous axial images were obtained from the base of the skull through the vertex without intravenous contrast. RADIATION DOSE REDUCTION: This exam was performed according to the departmental dose-optimization program which includes automated exposure control, adjustment of the mA and/or kV according to patient size and/or use of iterative reconstruction technique. COMPARISON:  09/18/2021 FINDINGS: Brain: No  evidence of acute infarction, hemorrhage, hydrocephalus, extra-axial collection or mass lesion/mass effect. There is mild diffuse low-attenuation within the subcortical and periventricular white matter compatible with chronic microvascular disease. Prominence of the sulci and ventricles compatible with brain atrophy. Vascular: No hyperdense vessel or unexpected calcification. Skull: Normal. Negative for fracture or focal lesion. Sinuses/Orbits: Paranasal sinuses and mastoid air cells are clear. Other: None IMPRESSION: 1. No acute intracranial abnormalities. 2. Chronic small vessel ischemic disease and brain atrophy. Electronically Signed   By: Kerby Moors M.D.   On: 11/11/2021 13:45   DG Chest Portable 1 View  Result Date: 11/11/2021 CLINICAL DATA:  Possible pneumonia or pulmonary edema. EXAM: PORTABLE CHEST 1 VIEW COMPARISON:  09/18/2021 FINDINGS: Cardiac silhouette is normal in size. No mediastinal or hilar masses. Mild interstitial prominence, most evident at the left lung base, stable. Lungs otherwise clear. Elevated left hemidiaphragm accentuated by patient tilting to the left. No convincing pleural effusion and no pneumothorax. Skeletal structures are grossly intact. IMPRESSION: No acute cardiopulmonary disease. Electronically Signed   By: Lajean Manes M.D.   On: 11/11/2021 11:44    Procedures Procedures    Medications Ordered in ED Medications  cefTRIAXone (ROCEPHIN) 2 g in sodium chloride 0.9 % 100 mL IVPB (has no administration in time range)    ED  Course/ Medical Decision Making/ A&P Clinical Course as of 11/11/21 1700  Sun Nov 11, 2021  1252 CBC with Differential(!) Hemoglobin decreased compared to previous [JK]  1252 Brain natriuretic peptide(!) BNP elevated at 554 [JK]  1252 Troponin I (High Sensitivity)(!) Troponin decreased compared to previous [JK]  1253 Comprehensive metabolic panel(!) BUN/creatinine elevated but similar compared to previous values.  Does appear to be slowly worsening [JK]  1253 DG Chest Portable 1 View Chest x-ray without acute findings [JK]  1326 Troponin I (High Sensitivity)(!) Serial troponins unchanged [JK]  1540 Brain natriuretic peptide(!) BNP is elevated compared to previous [JK]  1637 POC occult blood, ED [JK]  1637 Negative [JK]  1637 Urinalysis, Routine w reflex microscopic(!) Consistent with UTI [JK]  1657 Discussed with Dr Rogers Blocker regarding admission [JK]    Clinical Course User Index [JK] Dorie Rank, MD                           Medical Decision Making Problems Addressed: Acute cystitis without hematuria: acute illness or injury that poses a threat to life or bodily functions Anemia, unspecified type: chronic illness or injury with exacerbation, progression, or side effects of treatment Chronic kidney disease, unspecified CKD stage: chronic illness or injury with exacerbation, progression, or side effects of treatment  Amount and/or Complexity of Data Reviewed Labs: ordered. Decision-making details documented in ED Course. Radiology: ordered. Decision-making details documented in ED Course.  Risk Decision regarding hospitalization.   Patient presented to the ED for evaluation of increasing weakness.  Weakness appeared to be more global in nature.  Focal deficits noted on exam.  Head CT performed that does not show any acute abnormalities.  Patient's laboratory tests were notable for worsening anemia and chronic kidney disease.  These could be contributing to his overall weakness and  malaise.  His urinalysis also shows evidence of a urinary tract infection.  No findings to suggest evolving sepsis at this time.  Patient has been started on IV antibiotics.  With his weakness anemia worsening chronic kidney disease I will consult the medical service for admission and further treatment       Final  Clinical Impression(s) / ED Diagnoses Final diagnoses:  Acute cystitis without hematuria  Anemia, unspecified type  Chronic kidney disease, unspecified CKD stage    Rx / DC Orders ED Discharge Orders     None         Dorie Rank, MD 11/11/21 1700

## 2021-11-11 NOTE — Assessment & Plan Note (Deleted)
Likely multifactorial in setting of possible UTI, worsening uremia. Had weakness on last hospitalization and dispo to SNF Treat UTI GOC for CKD PT/OT to eval

## 2021-11-11 NOTE — ED Notes (Signed)
RN attempted straight stick unsuccessful. Phlebotomy notified

## 2021-11-11 NOTE — Assessment & Plan Note (Signed)
Troponin flat with no chest pain or changes on ekg Not consistent with ACS Continue to monitor

## 2021-11-11 NOTE — Assessment & Plan Note (Addendum)
86 year old presenting with increasing weakness over the past week found to have UTI and worsening renal function  -obs to tele -continue rocephin -urine culture pending -concern multifactorial in setting of worsening renal disease and uremia  -family states he is on methenamine, but contraindicated in renal impairment, not on med list and discussed with family  -GOC discussed with family. See #2.

## 2021-11-11 NOTE — Assessment & Plan Note (Addendum)
No sign of exacerbation, stable.  Continue home inhaler-per family rarely if ever uses

## 2021-11-11 NOTE — Assessment & Plan Note (Signed)
GLucose normal, A1c 5.4% and not on home meds

## 2021-11-11 NOTE — Assessment & Plan Note (Addendum)
Some history of possible bradycardia to low 40s by home  Health nurse. EDP discussed there was a recording of 42 in ER based off pulse ox read, but EKG was in the 70s.  Will continue telemetry to make sure no bradycardia contributing to weakness Continue eliquis for now, but concerned as renal function worsens he will be a high fall risk  Continue cardizem, clarify if on labetalol

## 2021-11-11 NOTE — ED Triage Notes (Signed)
Patient arrived from home by [facility] with complaint of increased weakness for the past 1-2 weeks. Patient found to have HR 42 and denies cp. Reports that he may have UTI but has not seen MD

## 2021-11-11 NOTE — Assessment & Plan Note (Signed)
Continue proscar and flomax  

## 2021-11-11 NOTE — Assessment & Plan Note (Addendum)
hgb 8.4, fecal occult negative Has missed his iron infusions since d/c from hospital as he did not get at [facility] and they haven't made it to appointments Consider IV iron here depending on [place]

## 2021-11-12 DIAGNOSIS — I5042 Chronic combined systolic (congestive) and diastolic (congestive) heart failure: Secondary | ICD-10-CM

## 2021-11-12 DIAGNOSIS — N3000 Acute cystitis without hematuria: Secondary | ICD-10-CM | POA: Diagnosis not present

## 2021-11-12 DIAGNOSIS — D638 Anemia in other chronic diseases classified elsewhere: Secondary | ICD-10-CM

## 2021-11-12 DIAGNOSIS — N401 Enlarged prostate with lower urinary tract symptoms: Secondary | ICD-10-CM

## 2021-11-12 DIAGNOSIS — N390 Urinary tract infection, site not specified: Secondary | ICD-10-CM | POA: Diagnosis not present

## 2021-11-12 DIAGNOSIS — N138 Other obstructive and reflux uropathy: Secondary | ICD-10-CM

## 2021-11-12 DIAGNOSIS — J449 Chronic obstructive pulmonary disease, unspecified: Secondary | ICD-10-CM

## 2021-11-12 LAB — CBC
HCT: 25.3 % — ABNORMAL LOW (ref 39.0–52.0)
Hemoglobin: 8.1 g/dL — ABNORMAL LOW (ref 13.0–17.0)
MCH: 31.3 pg (ref 26.0–34.0)
MCHC: 32 g/dL (ref 30.0–36.0)
MCV: 97.7 fL (ref 80.0–100.0)
Platelets: 201 10*3/uL (ref 150–400)
RBC: 2.59 MIL/uL — ABNORMAL LOW (ref 4.22–5.81)
RDW: 16.8 % — ABNORMAL HIGH (ref 11.5–15.5)
WBC: 5.7 10*3/uL (ref 4.0–10.5)
nRBC: 0 % (ref 0.0–0.2)

## 2021-11-12 LAB — URINE CULTURE

## 2021-11-12 LAB — BASIC METABOLIC PANEL
Anion gap: 9 (ref 5–15)
BUN: 75 mg/dL — ABNORMAL HIGH (ref 8–23)
CO2: 28 mmol/L (ref 22–32)
Calcium: 9.8 mg/dL (ref 8.9–10.3)
Chloride: 106 mmol/L (ref 98–111)
Creatinine, Ser: 3.56 mg/dL — ABNORMAL HIGH (ref 0.61–1.24)
GFR, Estimated: 16 mL/min — ABNORMAL LOW (ref >60)
Glucose, Bld: 91 mg/dL (ref 70–99)
Potassium: 4.6 mmol/L (ref 3.5–5.1)
Sodium: 143 mmol/L (ref 135–145)

## 2021-11-12 LAB — TSH: TSH: 1.235 u[IU]/mL (ref 0.350–4.500)

## 2021-11-12 MED ORDER — LABETALOL HCL 100 MG PO TABS
100.0000 mg | ORAL_TABLET | Freq: Every day | ORAL | Status: DC
  Administered 2021-11-12: 100 mg via ORAL
  Filled 2021-11-12 (×3): qty 1

## 2021-11-12 MED ORDER — ISOSORB DINITRATE-HYDRALAZINE 20-37.5 MG PO TABS
1.0000 | ORAL_TABLET | Freq: Three times a day (TID) | ORAL | Status: DC
Start: 2021-11-12 — End: 2021-11-13
  Administered 2021-11-12 – 2021-11-13 (×3): 1 via ORAL
  Filled 2021-11-12 (×7): qty 1

## 2021-11-12 MED ORDER — CEFADROXIL 500 MG PO CAPS
500.0000 mg | ORAL_CAPSULE | Freq: Every day | ORAL | Status: DC
  Administered 2021-11-12 – 2021-11-13 (×2): 500 mg via ORAL
  Filled 2021-11-12 (×3): qty 1

## 2021-11-12 NOTE — Progress Notes (Signed)
Patient refused CPAP tonight. RN at bedside and RT told her to call if PT changes his mind.

## 2021-11-12 NOTE — Care Management Obs Status (Signed)
MEDICARE OBSERVATION STATUS NOTIFICATION   Patient Details  Name: Roy [name] MRN: [id number] Date of Birth: 1935-07-17   Medicare Observation Status Notification Given:  Ernesta Amble, RN 11/12/2021, 8:28 PM

## 2021-11-12 NOTE — Discharge Planning (Signed)
RNCM met with pt at bedside regarding SNF vs Home Health.  Pt states he feels safe at home and feels comfortable that he could get himself out of apartment should an emergency arise.  States he mobilizes with cane and walker at home.  Pt currently active with [place] [facility] [place] services as confirmed by [facility] with Marj[facility].  Pt will resume [place] services of PT/OT/Aide/RN.

## 2021-11-12 NOTE — ED Notes (Signed)
Patient set up for breakfast.  

## 2021-11-12 NOTE — Progress Notes (Signed)
  Progress Note   Patient: Roy [name] BXI:[id number] DOB: 02-11-1936 DOA: 11/11/2021     0 DOS: the patient was seen and examined on 11/12/2021 at 12:00PM      Brief hospital course: Roy [name] is an 86 y.o. M with perssitent Afib and hx DVT on Eliquis, CKD IV, sdCHF EF 45-50%, OSA on BiPAP at home, COPD/asthma, DM, pHTN,  and HTN who presented with malaise and fatigue, fell out of bed.  In the ER, VSS normal.  Cr 3.69 (baseline 3.1-3.5), Hgb 8.4 (lower than recent baseline), BNP 554.  UA with leukocytes.  CXR clear and CTH normal.     8/20: Admitted on fluids, antibiotics     Assessment and Plan: * UTI (urinary tract infection) - Stop Rocephin - Repeat Urine culture - Hold further fluids - PO intake - Change to cefadroxil  - Stop methenamine  CKD (chronic kidney disease), stage IV (HCC) Cr stable relative to baseline (eGFR has been 16-19 since last Dec).  Has stated he would not want HD.    - Renally dose medications  Persistent atrial fibrillation (HCC) - Continue diltiazem, apixaban  Elevated troponin without myocardial injury Ischemia and myocardial injury ruled out.  Trop up due to renal failure/poor clearance.  Chronic combined systolic and diastolic CHF (congestive heart failure) (HCC) - Hold torsemide for now - Hold fluids - Continue Bidil  Uncontrolled hypertension BP normal - Continue diltiazem, Bidil, labetealol  Chronic obstructive airway disease with asthma (HCC) Asymptomatic  DM2 (diabetes mellitus, type 2) (HCC) GLucose normal, A1c 5.4% and not on home meds  Anemia of chronic disease Hgb low, but stable  OSA on CPAP - CPAP  Benign prostatic hyperplasia with urinary obstruction - Continue proscar and flomax           Subjective: Patient has no chest pain, dyspnea, abdominal pain.  He is uncooperative with questioning or exam.     Physical Exam: Vitals:   11/12/21 1100 11/12/21 1220 11/12/21 1400 11/12/21 1709  BP:  138/69  (!) 151/81   Pulse: (!) 57  64   Resp: 13  17   Temp:  98.6 F (37 C)  98.7 F (37.1 C)  TempSrc:  Oral  Oral  SpO2: 98%  94%    Elderly adult male, lying in bed, no acute distress RRR, no murmurs, no peripheral edema Respiratory rate normal, lungs clear without rales or wheezes Range of motion limited in both upper extremities, and generalized weakness, no focal weakness, speech fluent, face symmetric, attention normal, affect flat, judgment and insight appear mildly impaired, oriented to person, place, and time, mildly dysarthric at baseline Abdomen soft no tenderness palpation or guarding, no ascites or distention    Data Reviewed: Basic metabolic panel notable for creatinine 3.56, similar to baseline TSH normal Sodium potassium normal Magnesium normal Chest x-ray clear CT head unremarkable Hemoglobin down to 8.1 from 8.4 BNP 550 Fecal occult blood testing negative       Disposition: Status is: Observation         Author: Edwin Dada, MD 11/12/2021 5:53 PM  For on call review [email or web address].

## 2021-11-12 NOTE — Hospital Course (Addendum)
Roy [name] is an 86 y.o. M with perssitent Afib and hx DVT on Eliquis, CKD IV, sdCHF EF 45-50%, OSA on BiPAP at home, COPD/asthma, DM, pHTN,  and HTN who presented with malaise and fatigue, fell out of bed.  In the ER, VSS normal.  Cr 3.69 (baseline 3.1-3.5), Hgb 8.4 (lower than recent baseline), BNP 554.  UA with leukocytes.  CXR clear and CTH normal.     8/20: Admitted on fluids, antibiotics

## 2021-11-12 NOTE — Progress Notes (Signed)
OT Cancellation Note  Patient Details Name: Roy [name] MRN: [id number] DOB: 12-02-1935   Cancelled Treatment:    Reason Eval/Treat Not Completed: Patient declined, no reason specified Initiated OT eval. However, pt refused any EOB/OOB attempts despite education/encouragement. Will follow up for OT session when pt more willing to participate.   Layla Maw 11/12/2021, 12:39 PM

## 2021-11-12 NOTE — ED Notes (Signed)
Patient refusing to participated in physical therapy.

## 2021-11-12 NOTE — Progress Notes (Signed)
PT Cancellation Note  Patient Details Name: Roy [name] MRN: [id number] DOB: 01/11/1936   Cancelled Treatment:    Reason Eval/Treat Not Completed: Other (comment).  Pt is lethargic but also refusing to move with PT yet.  Follow up as time and pt allow.   Ramond Dial 11/12/2021, 1:33 PM  Mee Hives, PT PhD Acute Rehab Dept. Number: [place] and [place]

## 2021-11-13 ENCOUNTER — Other Ambulatory Visit (HOSPITAL_COMMUNITY): Payer: Self-pay

## 2021-11-13 ENCOUNTER — Encounter (HOSPITAL_COMMUNITY): Payer: Self-pay

## 2021-11-13 DIAGNOSIS — N390 Urinary tract infection, site not specified: Secondary | ICD-10-CM | POA: Diagnosis not present

## 2021-11-13 DIAGNOSIS — N3000 Acute cystitis without hematuria: Secondary | ICD-10-CM | POA: Diagnosis not present

## 2021-11-13 LAB — CBC
HCT: 24.6 % — ABNORMAL LOW (ref 39.0–52.0)
Hemoglobin: 8.2 g/dL — ABNORMAL LOW (ref 13.0–17.0)
MCH: 31.5 pg (ref 26.0–34.0)
MCHC: 33.3 g/dL (ref 30.0–36.0)
MCV: 94.6 fL (ref 80.0–100.0)
Platelets: 189 10*3/uL (ref 150–400)
RBC: 2.6 MIL/uL — ABNORMAL LOW (ref 4.22–5.81)
RDW: 16.8 % — ABNORMAL HIGH (ref 11.5–15.5)
WBC: 7 10*3/uL (ref 4.0–10.5)
nRBC: 0 % (ref 0.0–0.2)

## 2021-11-13 LAB — COMPREHENSIVE METABOLIC PANEL
ALT: 13 U/L (ref 0–44)
AST: 11 U/L — ABNORMAL LOW (ref 15–41)
Albumin: 2.4 g/dL — ABNORMAL LOW (ref 3.5–5.0)
Alkaline Phosphatase: 80 U/L (ref 38–126)
Anion gap: 10 (ref 5–15)
BUN: 75 mg/dL — ABNORMAL HIGH (ref 8–23)
CO2: 23 mmol/L (ref 22–32)
Calcium: 9.2 mg/dL (ref 8.9–10.3)
Chloride: 106 mmol/L (ref 98–111)
Creatinine, Ser: 3.42 mg/dL — ABNORMAL HIGH (ref 0.61–1.24)
GFR, Estimated: 17 mL/min — ABNORMAL LOW (ref >60)
Glucose, Bld: 130 mg/dL — ABNORMAL HIGH (ref 70–99)
Potassium: 4.1 mmol/L (ref 3.5–5.1)
Sodium: 139 mmol/L (ref 135–145)
Total Bilirubin: 0.7 mg/dL (ref 0.3–1.2)
Total Protein: 5.7 g/dL — ABNORMAL LOW (ref 6.5–8.1)

## 2021-11-13 MED ORDER — CEFADROXIL 500 MG PO CAPS
500.0000 mg | ORAL_CAPSULE | Freq: Every day | ORAL | 0 refills | Status: AC
  Filled 2021-11-13: qty 3, 3d supply, fill #0

## 2021-11-13 MED ORDER — SODIUM CHLORIDE 0.9 % IV SOLN
125.0000 mg | Freq: Once | INTRAVENOUS | Status: AC
  Administered 2021-11-13: 125 mg via INTRAVENOUS
  Filled 2021-11-13 (×2): qty 10

## 2021-11-13 NOTE — Progress Notes (Signed)
OT Cancellation Note  Patient Details Name: EWART CARRERA MRN: [id number] DOB: [birth year]   Cancelled Treatment:    Reason Eval/Treat Not Completed: Patient declined, no reason specified Once again, adamantly declined any EOB/OOB attempts with therapy despite education and encouragement. If refuses again, will have to sign off at acute level per departmental policy.   Layla Maw 11/13/2021, 9:20 AM

## 2021-11-13 NOTE — Progress Notes (Signed)
PT Cancellation Note  Patient Details Name: Roy [name] MRN: [id number] DOB: 06/19/1935   Cancelled Treatment:    Reason Eval/Treat Not Completed: Patient declined, no reason specified. Pt refusing physical therapy for second day in a row. Pt encouraged but still unwilling to participate. Will have to sign off if pt refuses again.  Donna Bernard, PT   Donna Bernard 11/13/2021, 9:32 AM

## 2021-11-13 NOTE — Plan of Care (Signed)
  Problem: Education: Goal: Knowledge of General Education information will improve Description: Including pain rating scale, medication(s)/side effects and non-pharmacologic comfort measures Outcome: Progressing   Problem: Activity: Goal: Risk for activity intolerance will decrease Outcome: Progressing   Problem: Nutrition: Goal: Adequate nutrition will be maintained Outcome: Progressing   Problem: Coping: Goal: Level of anxiety will decrease Outcome: Progressing   

## 2021-11-13 NOTE — Plan of Care (Signed)

## 2021-11-13 NOTE — Discharge Summary (Signed)
Physician Discharge Summary   Patient: Roy [name] MRN: [id number] DOB: 07/02/1935  Admit date:     11/11/2021  Discharge date: 11/13/21  Discharge Physician: Edwin Dada   PCP: Rogers Blocker, MD     Recommendations at discharge:  Follow up with [facility]      Discharge Diagnoses: Principal Problem:   UTI (urinary tract infection) Active Problems:   Anemia of chronic disease   CKD (chronic kidney disease), stage IV (HCC)   Persistent atrial fibrillation (HCC)   Chronic combined systolic and diastolic CHF (congestive heart failure) (HCC)   Elevated troponin without myocardial injury   Uncontrolled hypertension   DM2 (diabetes mellitus, type 2) (HCC)   Chronic obstructive airway disease with asthma (HCC)   OSA on CPAP   Benign prostatic hyperplasia with urinary obstruction         Hospital Course: Roy [name] is an 86 y.o. M with perssitent Afib and hx DVT on Eliquis, CKD IV, sdCHF EF 45-50%, OSA on BiPAP at home, COPD/asthma, DM, pHTN,  and HTN who presented with malaise and fatigue, fell out of bed.  In the ER, VSS normal.  Cr 3.69 (baseline 3.1-3.5), Hgb 8.4 (lower than recent baseline), BNP 554.  UA with leukocytes.  CXR clear and CTH normal.        * Weakness, likely due to UTI, progressive renal failure, anemia Patient admitted and started on IV fluids, antibiotics.  Urine culture contaminated.  Given IV iron (had missed two appointments for IV iron as an outpatient).    Antibiotics switched to oral.  Discussed prognosis with son, patient is active with [facility].    To the extent that his symptoms now are due to UTI or anemia, they should get better with treatment here.  To the extent that they are from renal failure progressing, they will likely slowly worsen.  Patient has been consistent in his refusal of dialysis, based on experiences with his late wife.   CKD (chronic kidney disease), stage  IV (HCC) Methenamine stopped due to renal function.  Persistent atrial fibrillation (HCC)  Elevated troponin without myocardial injury Ischemia and myocardial injury ruled out.  Trop up due to renal failure/poor clearance.  Chronic combined systolic and diastolic CHF (congestive heart failure) ([place])  Uncontrolled hypertension  Chronic obstructive airway disease with asthma (HCC) Asymptomatic  DM2 (diabetes mellitus, type 2) (HCC)  Anemia of chronic disease  OSA on CPAP  Benign prostatic hyperplasia with urinary obstruction            The [facility] Controlled Substances Registry was reviewed for this patient prior to discharge.     Disposition: Home health   DISCHARGE MEDICATION: Allergies as of 11/13/2021   No Known Allergies      Medication List     STOP taking these medications    furosemide 40 MG tablet Commonly known as: Lasix   methenamine 0.5 GM tablet Commonly known as: MANDELAMINE       TAKE these medications    cefadroxil 500 MG capsule Commonly known as: DURICEF Take 1 capsule (500 mg total) by mouth daily. Start taking on: November 14, 2021   cinacalcet 30 MG tablet Commonly known as: SENSIPAR Take 30 mg by mouth daily with supper.   Contour Next Test test strip Generic drug: glucose blood daily.   diltiazem 240 MG 24 hr capsule Commonly known as: CARDIZEM CD Take 240 mg by mouth daily.   Eliquis 2.5 MG  Tabs tablet Generic drug: apixaban Take 2.5 mg by mouth 2 (two) times daily.   finasteride 5 MG tablet Commonly known as: PROSCAR Take 5 mg by mouth daily.   gabapentin 100 MG capsule Commonly known as: NEURONTIN Take 100 mg by mouth at bedtime.   isosorbide-hydrALAZINE 20-37.5 MG tablet Commonly known as: BIDIL Take 1 tablet by mouth 3 (three) times daily.   labetalol 100 MG tablet Commonly known as: NORMODYNE Take 100 mg by mouth at bedtime.   Microlet Lancets Misc   pantoprazole 40 MG tablet Commonly  known as: PROTONIX Take 40 mg by mouth daily.   potassium chloride SA 20 MEQ tablet Commonly known as: KLOR-CON M Take 20 mEq by mouth daily.   tamsulosin 0.4 MG Caps capsule Commonly known as: FLOMAX Take 1 capsule (0.4 mg total) by mouth daily. What changed: when to take this   torsemide 100 MG tablet Commonly known as: DEMADEX Take 50 mg by mouth daily.   traZODone 50 MG tablet Commonly known as: DESYREL Take 50 mg by mouth at bedtime.        Follow-up Information     Rogers Blocker, MD Follow up.   Specialty: Internal Medicine Contact information: [place] [id number] [phone]         [facility] [place] Follow up.   Specialty: [place] Why: Resumtion of home health services will be provided by [place] information: [place] [id number] [phone]                 Discharge Instructions     Discharge instructions   Complete by: As directed    From Dr. Loleta Books: You were admitted for weakness This may have been from a bladder infection, although this is unclear There was no significant worsening of your kidney function, although it is now at an advanced level, and so many patients with kidney disease as advanced as yours do have symptoms (of weakness, fatigue, poor appetite, nausea, worsening memory problems).  Medicines don't necessarily help this, and so follow up with Palliative Care or Hospice would be the best course of action.  For the bladder infection, take cefadroxil 500 mg once daily for 3 more days Go see your primary care and get your kidney function checked in 1 week Resume your other home medicines   Increase activity slowly   Complete by: As directed        Discharge Exam: Filed Weights   11/13/21 0318  Weight: 81.6 kg    General: Pt is alert, awake, not in acute distress, resting, does not cooperate with exam Cardiovascular:  RRR, nl S1-S2, no murmurs appreciated.   No LE edema.   Respiratory: Normal respiratory rate and rhythm.  CTAB without rales or wheezes. Abdominal: Abdomen soft and non-tender.  No distension or HSM.     Condition at discharge: stable  The results of significant diagnostics from this hospitalization (including imaging, microbiology, ancillary and laboratory) are listed below for reference.   Imaging Studies: CT Head Wo Contrast  Result Date: 11/11/2021 CLINICAL DATA:  Neuro deficit.  Evaluate for stroke. EXAM: CT HEAD WITHOUT CONTRAST TECHNIQUE: Contiguous axial images were obtained from the base of the skull through the vertex without intravenous contrast. RADIATION DOSE REDUCTION: This exam was performed according to the departmental dose-optimization program which includes automated exposure control, adjustment of the mA and/or kV according to patient size and/or use of iterative  reconstruction technique. COMPARISON:  09/18/2021 FINDINGS: Brain: No evidence of acute infarction, hemorrhage, hydrocephalus, extra-axial collection or mass lesion/mass effect. There is mild diffuse low-attenuation within the subcortical and periventricular white matter compatible with chronic microvascular disease. Prominence of the sulci and ventricles compatible with brain atrophy. Vascular: No hyperdense vessel or unexpected calcification. Skull: Normal. Negative for fracture or focal lesion. Sinuses/Orbits: Paranasal sinuses and mastoid air cells are clear. Other: None IMPRESSION: 1. No acute intracranial abnormalities. 2. Chronic small vessel ischemic disease and brain atrophy. Electronically Signed   By: Kerby Moors M.D.   On: 11/11/2021 13:45   DG Chest Portable 1 View  Result Date: 11/11/2021 CLINICAL DATA:  Possible pneumonia or pulmonary edema. EXAM: PORTABLE CHEST 1 VIEW COMPARISON:  09/18/2021 FINDINGS: Cardiac silhouette is normal in size. No mediastinal or hilar masses. Mild interstitial prominence,  most evident at the left lung base, stable. Lungs otherwise clear. Elevated left hemidiaphragm accentuated by patient tilting to the left. No convincing pleural effusion and no pneumothorax. Skeletal structures are grossly intact. IMPRESSION: No acute cardiopulmonary disease. Electronically Signed   By: Lajean Manes M.D.   On: 11/11/2021 11:44    Microbiology: Results for orders placed or performed during the hospital encounter of 11/11/21  Urine Culture     Status: Abnormal   Collection Time: 11/11/21  4:39 PM   Specimen: Urine, Clean Catch  Result Value Ref Range Status   Specimen Description URINE, CLEAN CATCH  Final   Special Requests   Final    NONE Performed at [facility], [place] [address], [place], [place] [address]    Culture MULTIPLE SPECIES PRESENT, SUGGEST RECOLLECTION (A)  Final   Report Status 11/12/2021 FINAL  Final    Labs: CBC: Recent Labs  Lab 11/11/21 1129 11/12/21 0400 11/13/21 0217  WBC 6.4 5.7 7.0  NEUTROABS 5.1  --   --   HGB 8.4* 8.1* 8.2*  HCT 26.4* 25.3* 24.6*  MCV 98.5 97.7 94.6  PLT 218 201 637   Basic Metabolic Panel: Recent Labs  Lab 11/11/21 1129 11/12/21 0400 11/13/21 0217  NA 140 143 139  K 4.6 4.6 4.1  CL 105 106 106  CO2 '25 28 23  '$ GLUCOSE 127* 91 130*  BUN 77* 75* 75*  CREATININE 3.69* 3.56* 3.42*  CALCIUM 9.7 9.8 9.2  MG 1.7  --   --    Liver Function Tests: Recent Labs  Lab 11/11/21 1129 11/13/21 0217  AST 13* 11*  ALT 15 13  ALKPHOS 100 80  BILITOT 1.0 0.7  PROT 6.2* 5.7*  ALBUMIN 2.8* 2.4*   CBG: No results for input(s): "GLUCAP" in the last 168 hours.  Discharge time spent: approximately 35 minutes spent on discharge counseling, evaluation of patient on day of discharge, and coordination of discharge planning with nursing, social work, pharmacy and case management  Signed: Edwin Dada, MD [facility] 11/13/2021

## 2021-11-13 NOTE — TOC Transition Note (Signed)
Transition of Care [facility]) - CM/SW Discharge Note   Patient Details  Name: Roy [name] MRN: [id number] Date of Birth: [birth year]  [facility] CM/SW Contact:  Sharin Mons, RN Phone Number: 11/13/2021, 2:49 PM   Clinical Narrative:    Patient will DC to: home Anticipated DC date: 11/13/2021 Family notified: yes, son Transport by: Corey Harold  Late entry : 12:40 pm Per MD patient ready for DC today. RN, patient (confused), patient's son Goeff, and  [place] notified of DC. Pt states wants to go home. SNF placement declined . Pt active with [facility], outpt palliative care. Pt slightly confused. NCM spoke with son regarding d/c plan. Son states pt is to d/c to home with the resumption of home health services, [place]. States pt with 24/7 supervision and assistance @ home. States will f/u with A.C regarding pt transitioning to hospice at appointed time. Transportation to home arranged with [facility]. Nurse to call son once ambulance has arrived on unit.  RNCM will sign off for now as intervention is no longer needed. Please consult [place] again if new needs arise.    Final next level of care: [place] Barriers to Discharge: No Barriers Identified   Patient Goals and CMS Choice        Discharge Placement                       Discharge Plan and Services                                     Social Determinants of Health (SDOH) Interventions     Readmission Risk Interventions     No data to display

## 2021-11-14 ENCOUNTER — Other Ambulatory Visit: Payer: Self-pay | Admitting: Cardiology

## 2021-11-14 DIAGNOSIS — I10 Essential (primary) hypertension: Secondary | ICD-10-CM

## 2021-11-16 ENCOUNTER — Other Ambulatory Visit: Payer: Self-pay | Admitting: Cardiology

## 2021-11-16 DIAGNOSIS — I10 Essential (primary) hypertension: Secondary | ICD-10-CM

## 2021-11-21 ENCOUNTER — Other Ambulatory Visit: Payer: Self-pay | Admitting: Cardiology

## 2021-11-21 DIAGNOSIS — I10 Essential (primary) hypertension: Secondary | ICD-10-CM

## 2021-12-04 ENCOUNTER — Telehealth: Payer: Self-pay

## 2021-12-04 ENCOUNTER — Other Ambulatory Visit: Payer: Self-pay

## 2021-12-04 NOTE — Telephone Encounter (Signed)
Spoke with patient's son [name] and scheduled a telephonic Palliative Consult for 12/10/21 @ 1:30 PM.  Consent obtained; updated Netsmart, Team List and Epic.

## 2021-12-04 NOTE — Telephone Encounter (Signed)
Attempted to contact patient's son [name] to schedule a Palliative Care consult appointment. No answer left a message to return call.

## 2021-12-10 ENCOUNTER — Other Ambulatory Visit: Payer: BC Managed Care – PPO | Admitting: Family Medicine

## 2021-12-10 DIAGNOSIS — I5032 Chronic diastolic (congestive) heart failure: Secondary | ICD-10-CM

## 2021-12-10 DIAGNOSIS — F03B3 Unspecified dementia, moderate, with mood disturbance: Secondary | ICD-10-CM

## 2021-12-10 DIAGNOSIS — R531 Weakness: Secondary | ICD-10-CM

## 2021-12-10 NOTE — Progress Notes (Unsigned)
[facility] Pall[facility] Care Consult Note Telephone: [phone]  Fax: [phone]    Date of encounter: 12/10/21 1:42 PM PATIENT NAME: Roy [name] [id number]   [phone] (home)  DOB: September 03, 1935 MRN: [id number] PRIMARY CARE PROVIDER:    Rogers Blocker, MD,  [address] [place] [address] [phone]  REFERRING PROVIDER:   Rogers Blocker, [place],  [place] [address] [phone]  RESPONSIBLE PARTY:    Contact Information     Name Relation Home Work [name]   [phone]   [name], [name]   [phone]       I connected with  Roy [name]'s son Roy [name] on 12/12/21 by telephone application and verified that I am speaking with the correct person using two identifiers.   I discussed the limitations of evaluation and management by telemedicine. The patient expressed understanding and agreed to proceed.  Palliative Care was asked to follow this patient by consultation request of  Rogers Blocker, MD to address advance care planning and complex medical decision making. This is a follow up visit   ASSESSMENT , SYMPTOM MANAGEMENT AND PLAN / RECOMMENDATIONS:   Moderate dementia with mood disturbance FAST 7 Score 6 e Agree with Trazodone 50 mg QHS for normal sleep pattern and medications administered by caregivers to ensure compliance but not currently on meds specific for dementia. Can mimic desired behaviors like brushing teeth to help pt understand desired behaviors Give simple instructions one step at a time to improve success  2.  Chronic Diastolic Heart Failure Monitor for worsening fatigue with activity. Daily weights with notification of PCP for weight gain 3 lbs overnight or 5 lbs in 5 days. Continue Torsemide 50 mg daily with Potassium 20 mEq daily, Continue Labetalol 100 mg QHS and Bidil 20-37.5 mg TID. Elevate  BLE when sitting or lying to help with fluid mobilization.  3.    Weakness Continues to receive HH PT and OT through [facility].     Advance Care Planning/Goals of Care: Goals include to maximize quality of life and symptom management. Health care surrogate gave his permission to discuss. Our advance care planning conversation included a discussion about:    The value and importance of advance care planning  Experiences with loved ones who have been seriously ill or have died-pt recently lost a sister Exploration of goals of care in the event of a sudden injury or illness  Identification of a healthcare agent  CODE STATUS: Remains full code at present     Follow up Palliative Care Visit: Palliative care will continue to follow for complex medical decision making, advance care planning, and clarification of goals. Return 4 weeks or prn.    This visit was coded based on medical decision making (MDM).  PPS: 50%  HOSPICE ELIGIBILITY/DIAGNOSIS: TBD  Chief Complaint:  Palliative Care is following pt for chronic medical management of heart failure in setting of dementia.  Palliative Care is also following to assist with advance care planning and defining/refining goals of care.  HISTORY OF PRESENT ILLNESS:  Roy [name] is a 86 y.o. year old male with recent acute on chronic CHF late June and acute cystitis with short stay in rehab. He also has HTN, persistent atrial fibrillation, pulmonary hypertension and combine systolic and diastolic CHF, OSA on CPAP, chronic obstructive airway disease with asthma, DM, CKD stage IV, thrombocytopenia, dementia  and anemia of chronic disease. Pt was last seen by this Palliative provider on 08/30/21 when pt was sent to ED with heart failure exacerbation.Transthoracic Echocardiogram significant for reduced LVEF of 45-50% and global hypokinesis. The estimated right ventricular systolic pressure is 84.1 mmHg.  He was noted to have an elevated  troponin of 103 which was flat and thought likely due to heart failure and demand ischemia.  D/C weight 189.5 lbs.  He went home with family and home health RN, PT, OT and aide through [facility]. He was readmitted for the same acute on chronic heart failure from 09/18/21-09/24/2021. Presented with significant bilateral peripheral edema, shortness of breath, difficulty with compliance with medications at home. Not on ACE/ARB/ARNI with CKD, advanced and worsening renal function, started on BiDil.  Patient was placed on IV Lasix for diuresis which was later held due to worsening renal function.  .  Cardiology was consulted.  Negative balance of 6.9 L, weight down from 185.6lbs on admission to 179 lbs at the time of discharge when he was d/c'd to a facility. On 11/11/21 he was readmitted with UTI for 2 days.  He missed 2 appointments for IV iron.  Pt was refusing dialysis and discussion of prognosis was had with family who indicated if renal function worsens to the point of needing dialysis then they would want hospice.  Urine culture was contaminated and grew multiple species. He had slid off the bed at the facility. He was treated in ED with IV Rocephin.  Creatinine elevated to 3.69 with BUN 77, eGFR 15.   He was d/c'd back to the facility with [facility].  CT head 11/11/21 showed no acute abnormalities, chronic small vessel ischemic disease and brain atrophy.    Telephone call with son Roy [name] by phone.  Pt is back in his home with caregivers during the day and part of the evening until pt is in bed.  Family has cameras in his home so they can monitor him at night. Pt is in good spirits when not moving around and when going to the living room to get up he is more rebellious.  He is maintaining and they allow him to do what he feels like with regard to getting up and down.  Caregivers make sure to elevate his legs. No dyspnea when laying down.  Doing PT/OT and at times getting his  participation is difficult.  Last week they helped him ambulate with a walker. Caregivers ensure he is taking his meds and he has a hospital bed.  Eating when hungry or feels like it. Not dropping weight currently.  If he doesn't want to eat what he has the caregivers will make him something soft to eat.  Working with [facility].  Son, unsure of last weight, thinking in the 160s.  No constipation.  Wears depends as he is incontinent of bowel and bladder.  History obtained from review of EMR, discussion with son Roy [name] and/or Mr. Resnik.  I reviewed EMR for available labs, medications, imaging, studies and related documents.  Records reviewed and summarized above.   ROS obtained from son ENMT: denies dysphagia Cardiovascular: denies chest pain, endorses some DOE Pulmonary: denies cough or increased SOB Abdomen: endorses variable appetite, denies constipation, endorses incontinence of bowel GU: denies dysuria, endorses incontinence of urine MSK:  endorses increased weakness with variable participation with PT,  no falls  Neurological: denies pain, denies insomnia Psych: Endorses occasional mood lability   Physical  Exam: pt unavailable to talk Current and past weights: 179 lbs 14.3 oz on 11/11/21, weight 04/18/21 was 206 lbs    Thank you for the opportunity to participate in the care of Mr. Stump.  The palliative care team will continue to follow. Please call our office at [phone] if we can be of additional assistance.   Marijo Conception, FNP -C  COVID-19 PATIENT SCREENING TOOL Asked and negative response unless otherwise noted:   Have you had symptoms of covid, tested positive or been in contact with someone with symptoms/positive test in the past 5-10 days?

## 2021-12-11 ENCOUNTER — Other Ambulatory Visit: Payer: Self-pay

## 2021-12-11 MED ORDER — LABETALOL HCL 100 MG PO TABS: 100.0000 mg | ORAL_TABLET | Freq: Every day | ORAL | 0 refills | Status: AC

## 2021-12-12 ENCOUNTER — Encounter: Payer: Self-pay | Admitting: Family Medicine

## 2021-12-12 DIAGNOSIS — F03B3 Unspecified dementia, moderate, with mood disturbance: Secondary | ICD-10-CM | POA: Insufficient documentation

## 2021-12-13 NOTE — Addendum Note (Signed)
Addended by: Damaris Hippo on: 12/13/2021 12:19 AM   Modules accepted: Level of Service

## 2021-12-22 ENCOUNTER — Other Ambulatory Visit: Payer: Self-pay | Admitting: Cardiology

## 2021-12-22 DIAGNOSIS — I10 Essential (primary) hypertension: Secondary | ICD-10-CM

## 2022-01-03 ENCOUNTER — Other Ambulatory Visit: Payer: Self-pay | Admitting: Cardiology

## 2022-01-03 ENCOUNTER — Other Ambulatory Visit: Payer: BC Managed Care – PPO | Admitting: Family Medicine

## 2022-01-03 DIAGNOSIS — E43 Unspecified severe protein-calorie malnutrition: Secondary | ICD-10-CM

## 2022-01-03 DIAGNOSIS — F03C3 Unspecified dementia, severe, with mood disturbance: Secondary | ICD-10-CM

## 2022-01-03 DIAGNOSIS — Z515 Encounter for palliative care: Secondary | ICD-10-CM

## 2022-01-03 DIAGNOSIS — I5042 Chronic combined systolic (congestive) and diastolic (congestive) heart failure: Secondary | ICD-10-CM

## 2022-01-03 DIAGNOSIS — I10 Essential (primary) hypertension: Secondary | ICD-10-CM

## 2022-01-03 DIAGNOSIS — Z7401 Bed confinement status: Secondary | ICD-10-CM

## 2022-01-12 ENCOUNTER — Encounter: Payer: Self-pay | Admitting: Family Medicine

## 2022-01-12 DIAGNOSIS — E43 Unspecified severe protein-calorie malnutrition: Secondary | ICD-10-CM | POA: Insufficient documentation

## 2022-01-12 DIAGNOSIS — Z7401 Bed confinement status: Secondary | ICD-10-CM | POA: Insufficient documentation

## 2022-01-12 NOTE — Progress Notes (Signed)
[facility] Pall[facility] Consult Note Telephone: [phone]  Fax: [phone]    Date of encounter: 01/14/2022 1:10 PM  PATIENT NAME: Roy [name] [id number]   [phone] (home)  DOB: Jun 16, 1935 MRN: [id number] PRIMARY CARE PROVIDER:    Rogers Blocker, MD,  [address] [place] [address] [phone]  REFERRING PROVIDER:   [name], [place],  [place] [address] [phone]  RESPONSIBLE PARTY:    Contact Information     Name Relation Home Work [place] Son   [phone]   Roy [name], Roy [name]   [phone]        I met face to face with patient and paid caregiver in his home.  Spoke with son Roy [name] and daughter-in-law by phone at end of visit.  Palliative Care was asked to follow this patient by consultation request of  Roy Blocker, MD to address advance care planning and complex medical decision making. This is a follow up visit   ASSESSMENT , SYMPTOM MANAGEMENT AND PLAN / RECOMMENDATIONS:   Palliative Care Encounter Discussed with son Roy [name]) and wife indicators of poor prognosis with frequent heart failure exacerbations/worsening heart function, worsening renal failure and likely poor tolerance of dialysis if pt was not averse to dialysis, poor nutrition/bedbound status which increases immune compromise and risk for infection. Also discussed benefits of Hospice services to have someone additional to help with personal care, more frequent check in by nursing and symptom management, SW and Chaplain support for patient and family. Currently son is not ready to change code status or start on Hospice but says he acknowledges significant decline in functional status, health and nutrition.   Severe dementia, unspecified type, with mood disturbance FAST 7 score 7d-bedbound, poor verbal or interaction Rapid decline over  summer months with heart failure and acute illness.   Chronic combined systolic and diastolic CHF Worsening with severe pulmonary hypertension despite 240 mg long acting Cardizem, beta [name] and long acting nitrate. Encouraged Hospice services-son declined   Bedbound/Severe Protein Calorie malnutrition Weight loss of 42.8 lbs since 04/18/21 Albumin low at 2.4, poor intake Advised to try finger foods and favorites to tempt pt to eat Pt not consistent with taking meds and meds like Remeron or Seroquel could also worsen heart function. Continue to support family and encourage hospice transition.  Advance Care Planning/Goals of Care: Goals include to maximize quality and quantity of life.  Our advance care planning conversation included a discussion about:    The value and importance of advance care planning  Experiences with loved ones who have been seriously ill or have died-pt had a sister who died over the summer, Has been adamant because of his wife's renal failure that he does not want dialysis Exploration of goals of care in the event of a sudden injury or illness-family wants full code Identification of a healthcare agent-son Roy [name] Review of an advance directive document-MOST form reviewed. CODE STATUS: Remains full code     Follow up Palliative Care Visit: Palliative care will continue to follow for complex medical decision making, advance care planning, and clarification of goals. Return 4 weeks or prn.   This visit was coded based on medical decision making (MDM).  PPS: 30%  HOSPICE ELIGIBILITY/DIAGNOSIS: TBD  Chief Complaint:  Palliative Care is continuing to follow patient for chronic medical management in setting of dementia and to assist with advanced  care planning, refining and defining goals of care.   HISTORY OF PRESENT ILLNESS:  Roy [name] is a 86 y.o. year old male with acute on chronic CHF late June and acute cystitis with short stay in rehab in August.  He also has HTN, persistent atrial fibrillation, pulmonary hypertension and chronic combined systolic and diastolic CHF, OSA on CPAP, chronic obstructive airway disease with asthma, DM, CKD stage IV, thrombocytopenia, dementia and anemia of chronic disease.  He has 24/7 paid caregiver in the home. Caregiver states pt continues to show significant decline since he came home from rehab after August hospitalization he has not gotten up OOB. He can be agitated, resistive to care or taking meds at times, refuses bathing and is only eating minimally.  She also indicates he does not get OOB and sleeps for 19-20 hours daily. He has PT and OT through [facility] but he has intermittently refused to work with them.  Son wants to continue to work with PT though it is unclear how long this will continue if pt refuses therapy.  From 02/20/21 2d echo demonstrates decline in EF from 55-60% to 45-50%, normal to mildly decreased LV function, no RWMA to global hypokinesis, moderate pulmonary hypertension with RVSP increased from 51.6 to 83.9 mm HG, increase in suggested right atrial pressure of 3 to 15 mm HG.   History obtained from review of EMR, discussion with with son Roy [name] and daughter-in-law, paid caregiver and/or Roy [name].     Latest Ref Rng & Units 11/13/2021    2:17 AM 11/12/2021    4:00 AM 11/11/2021   11:29 AM  CBC  WBC 4.0 - 10.5 K/uL 7.0  5.7  6.4   Hemoglobin 13.0 - 17.0 g/dL 8.2  8.1  8.4   Hematocrit 39.0 - 52.0 % 24.6  25.3  26.4   Platelets 150 - 400 K/uL 189  201  218        Latest Ref Rng & Units 11/13/2021    2:17 AM 11/12/2021    4:00 AM 11/11/2021   11:29 AM  CMP  Glucose 70 - 99 mg/dL 130  91  127   BUN 8 - 23 mg/dL 75  75  77   Creatinine 0.61 - 1.24 mg/dL 3.42  3.56  3.69   Sodium 135 - 145 mmol/L 139  143  140   Potassium 3.5 - 5.1 mmol/L 4.1  4.6  4.6   Chloride 98 - 111 mmol/L 106  106  105   CO2 22 - 32 mmol/L _0 Calcium 8.9 - 10.3 mg/dL 9.2  9.8  9.7   Total  Protein 6.5 - 8.1 g/dL 5.7   6.2   Total Bilirubin 0.3 - 1.2 mg/dL 0.7   1.0   Alkaline Phos 38 - 126 U/L 80   100   AST 15 - 41 U/L 11   13   ALT 0 - 44 U/L 13   15   Albumin has declined from 2.8 four months ago to 2.4 in August. His eGFR is 17 ml/min.  11/11/21) 3 mo ago (09/18/21) 10 mo ago (02/21/21) 6 yr ago (10/15/15) 6 yr ago (10/06/15) 9 yr ago (09/26/12) 9 yr ago (09/26/12)    Color, Urine YELLOW YELLOW  YELLOW  YELLOW  YELLOW  AMBER Abnormal  CM  YELLOW    APPearance CLEAR CLEAR  CLEAR  CLOUDY Abnormal   CLOUDY Abnormal   TURBID Abnormal   TURBID  Abnormal     Comment: CLOUDY  Specific Gravity, Urine 1.005 - 1.030 1.010  1.008  1.020  1.019  1.018  1.014  1.025   pH 5.0 - 8.0 6.5  6.0  6.0  6.0  5.5  6.5  6.5   Glucose, UA NEGATIVE mg/dL NEGATIVE  NEGATIVE  NEGATIVE  NEGATIVE  NEGATIVE  NEGATIVE  NEGATIVE   Hgb urine dipstick NEGATIVE MODERATE Abnormal   SMALL Abnormal   MODERATE Abnormal   LARGE Abnormal   LARGE Abnormal   LARGE Abnormal   LARGE Abnormal    Bilirubin Urine NEGATIVE NEGATIVE  NEGATIVE  NEGATIVE  NEGATIVE  NEGATIVE  NEGATIVE  NEGATIVE   Ketones, ur NEGATIVE mg/dL NEGATIVE  NEGATIVE  NEGATIVE  NEGATIVE  NEGATIVE  NEGATIVE  NEGATIVE   Protein, ur NEGATIVE mg/dL 100 Abnormal   100 Abnormal   >300 Abnormal   100 Abnormal   >300 Abnormal   >300 Abnormal   >=300 Abnormal    Nitrite NEGATIVE NEGATIVE  NEGATIVE  NEGATIVE  NEGATIVE  POSITIVE Abnormal   NEGATIVE  NEGATIVE   Leukocytes,Ua NEGATIVE LARGE Abnormal   MODERATE Abnormal   TRACE Abnormal  CM       Comment: Performed at [facility] [address], [place], [place] [address]  RBC / HPF   0-5 R        WBC, UA   21-50 R        Bacteria, UA   FEW Abnormal  R        Mucus   PRESENT CM        Leukocytes, UA     TRACE Abnormal  R  LARGE Abnormal  R  LARGE Abnormal  R  SMALL Abnormal  R, CM   Urobilinogen, UA          Urine culture showed multiple species and re-collection was suggested FOBT 11/11/21 was  negative  CT head wo contrast 11/11/21: IMPRESSION: 1. No acute intracranial abnormalities. 2. Chronic small vessel ischemic disease and brain atrophy.  Component Ref Range & Units 2 mo ago 3 mo ago 4 mo ago  B Natriuretic Peptide 0.0 - 100.0 pg/mL 554.5 High   304.9 High  CM  1,072.4 High  CM   Comment: Performed at [facility] [address], [place], [place] [address]   Portable CXR 11/11/21: FINDINGS: Cardiac silhouette is normal in size. No mediastinal or hilar masses.   Mild interstitial prominence, most evident at the left lung base, stable. Lungs otherwise clear.   Elevated left hemidiaphragm accentuated by patient tilting to the left.   No convincing pleural effusion and no pneumothorax.   Skeletal structures are grossly intact.   IMPRESSION: No acute cardiopulmonary disease.  Last 2d echo 08/31/21: IMPRESSIONS     1. Left ventricular ejection fraction, 45 to 50%. The  left ventricle has mildly decreased function, global hypokinesis and mild LVH. Left ventricular diastolic parameters are indeterminate.   2. Right ventricular systolic function is mildly reduced. The right ventricular size is normal. There is severely elevated pulmonary artery systolic pressure with estimated right ventricular systolic pressure of 51.0 mmHg.   3. Left atrial size was severely dilated.   4. Right atrial size was moderately dilated.   5. The mitral valve is normal in structure. Mild to moderate mitral valve regurgitation with no stenosis.   6. The aortic valve is tricuspid with mild calcification, no regurgitation or stenosis is present.   7. The  inferior vena cava is dilated in size with <50% respiratory variability, suggesting right atrial pressure of 15 mmHg.   8. The patient is in atrial fibrillation.   I reviewed EMR for available labs, medications, imaging, studies and related documents.  Records reviewed and summarized above.   ROS Information obtained from paid  caregiver: Some coughing after eating and drinking even with thickened liquids Incontinent of bowel and bladder, has condom cath changed daily difficulty shifting his own weight to change positions no skin breakdown.    Physical Exam: Current and past weights: 163.2 lbs, weight 04/18/21 was 206 lbs Constitutional: NAD General: frail appearing  ENMT: hard of hearing CV: S1S2, IRIR, no LE edema Pulmonary: CTAB, no increased work of breathing, no cough, room air Abdomen: normo-active BS + 4 quadrants, soft and non tender MSK: noted significant loss of muscle mass and subcu fat both centrally and in extremities, moves all extremities, bedbound Skin: warm and dry, no rashes or wounds on visible skin Neuro:  noted generalized weakness-can shift his body some in the bed but not turnover and reposition,  noted cognitive impairment Psych: non-anxious affect, Drowsy, arousable and O x 1 Hem/lymph/immuno: no widespread bruising   Thank you for the opportunity to participate in the care of Mr. Doeden.  The palliative care team will continue to follow. Please call our office at [phone] if we can be of additional assistance.   Marijo Conception, FNP -C  COVID-19 PATIENT SCREENING TOOL Asked and negative response unless otherwise noted:   Have you had symptoms of covid, tested positive or been in contact with someone with symptoms/positive test in the past 5-10 days?  No

## 2022-01-20 ENCOUNTER — Other Ambulatory Visit: Payer: Self-pay | Admitting: Cardiology

## 2022-01-20 DIAGNOSIS — I10 Essential (primary) hypertension: Secondary | ICD-10-CM

## 2022-02-22 DEATH — deceased

## 2022-11-06 NOTE — Telephone Encounter (Signed)
done
